# Patient Record
Sex: Female | Born: 1965
Health system: Southern US, Community
[De-identification: ages and names within clinical notes are randomized; demographics above are authoritative.]

## PROBLEM LIST (undated history)

## (undated) DIAGNOSIS — M1990 Unspecified osteoarthritis, unspecified site: Secondary | ICD-10-CM

## (undated) DIAGNOSIS — K219 Gastro-esophageal reflux disease without esophagitis: Secondary | ICD-10-CM

## (undated) DIAGNOSIS — E669 Obesity, unspecified: Secondary | ICD-10-CM

## (undated) DIAGNOSIS — G43909 Migraine, unspecified, not intractable, without status migrainosus: Secondary | ICD-10-CM

## (undated) DIAGNOSIS — J45909 Unspecified asthma, uncomplicated: Secondary | ICD-10-CM

## (undated) DIAGNOSIS — R569 Unspecified convulsions: Secondary | ICD-10-CM

## (undated) DIAGNOSIS — I10 Essential (primary) hypertension: Secondary | ICD-10-CM

## (undated) DIAGNOSIS — E785 Hyperlipidemia, unspecified: Secondary | ICD-10-CM

## (undated) DIAGNOSIS — E119 Type 2 diabetes mellitus without complications: Secondary | ICD-10-CM

## (undated) HISTORY — DX: Essential (primary) hypertension: I10

## (undated) HISTORY — DX: Hyperlipidemia, unspecified: E78.5

## (undated) HISTORY — DX: Obesity, unspecified: E66.9

## (undated) HISTORY — DX: Unspecified asthma, uncomplicated: J45.909

## (undated) HISTORY — DX: Type 2 diabetes mellitus without complications: E11.9

## (undated) HISTORY — DX: Unspecified osteoarthritis, unspecified site: M19.90

## (undated) HISTORY — PX: PARTIAL HYSTERECTOMY: SHX80

---

## 1995-11-04 HISTORY — PX: ABDOMINAL HYSTERECTOMY: SHX81

## 2004-09-25 ENCOUNTER — Ambulatory Visit: Payer: Self-pay | Admitting: Family Medicine

## 2004-10-02 ENCOUNTER — Ambulatory Visit (HOSPITAL_COMMUNITY): Admission: RE | Admit: 2004-10-02 | Discharge: 2004-10-02 | Payer: Self-pay | Admitting: Family Medicine

## 2004-10-21 ENCOUNTER — Ambulatory Visit (HOSPITAL_COMMUNITY): Admission: RE | Admit: 2004-10-21 | Discharge: 2004-10-21 | Payer: Self-pay | Admitting: Family Medicine

## 2004-10-23 ENCOUNTER — Ambulatory Visit: Payer: Self-pay | Admitting: Family Medicine

## 2005-02-20 ENCOUNTER — Ambulatory Visit: Payer: Self-pay | Admitting: Family Medicine

## 2005-06-25 ENCOUNTER — Ambulatory Visit: Payer: Self-pay | Admitting: Family Medicine

## 2006-08-24 ENCOUNTER — Encounter: Payer: Self-pay | Admitting: Family Medicine

## 2006-08-24 ENCOUNTER — Encounter (INDEPENDENT_AMBULATORY_CARE_PROVIDER_SITE_OTHER): Payer: Self-pay | Admitting: *Deleted

## 2006-08-24 ENCOUNTER — Ambulatory Visit: Payer: Self-pay | Admitting: Family Medicine

## 2006-08-24 ENCOUNTER — Other Ambulatory Visit: Admission: RE | Admit: 2006-08-24 | Discharge: 2006-08-24 | Payer: Self-pay | Admitting: Family Medicine

## 2006-08-24 LAB — CONVERTED CEMR LAB: Pap Smear: NORMAL

## 2008-02-07 ENCOUNTER — Ambulatory Visit: Payer: Self-pay | Admitting: Family Medicine

## 2008-02-09 ENCOUNTER — Ambulatory Visit (HOSPITAL_COMMUNITY): Admission: RE | Admit: 2008-02-09 | Discharge: 2008-02-09 | Payer: Self-pay | Admitting: Family Medicine

## 2008-02-10 ENCOUNTER — Encounter (INDEPENDENT_AMBULATORY_CARE_PROVIDER_SITE_OTHER): Payer: Self-pay | Admitting: *Deleted

## 2008-02-10 DIAGNOSIS — I10 Essential (primary) hypertension: Secondary | ICD-10-CM | POA: Insufficient documentation

## 2008-06-10 ENCOUNTER — Emergency Department (HOSPITAL_COMMUNITY): Admission: EM | Admit: 2008-06-10 | Discharge: 2008-06-10 | Payer: Self-pay | Admitting: Emergency Medicine

## 2008-06-28 ENCOUNTER — Ambulatory Visit: Payer: Self-pay | Admitting: Family Medicine

## 2008-06-28 DIAGNOSIS — R109 Unspecified abdominal pain: Secondary | ICD-10-CM | POA: Insufficient documentation

## 2008-07-05 ENCOUNTER — Encounter: Payer: Self-pay | Admitting: Family Medicine

## 2008-07-05 LAB — CONVERTED CEMR LAB
BUN: 22 mg/dL (ref 6–23)
Basophils Absolute: 0 10*3/uL (ref 0.0–0.1)
Basophils Relative: 0 % (ref 0–1)
CO2: 22 meq/L (ref 19–32)
Calcium: 9.6 mg/dL (ref 8.4–10.5)
Chloride: 102 meq/L (ref 96–112)
Cholesterol: 137 mg/dL (ref 0–200)
Creatinine, Ser: 0.99 mg/dL (ref 0.40–1.20)
Eosinophils Absolute: 0.2 10*3/uL (ref 0.0–0.7)
Eosinophils Relative: 3 % (ref 0–5)
Glucose, Bld: 80 mg/dL (ref 70–99)
HCT: 37.9 % (ref 36.0–46.0)
HDL: 45 mg/dL (ref >39)
Hemoglobin: 12.9 g/dL (ref 12.0–15.0)
LDL Cholesterol: 83 mg/dL (ref 0–99)
Lymphocytes Relative: 33 % (ref 12–46)
Lymphs Abs: 2.4 10*3/uL (ref 0.7–4.0)
MCHC: 34 g/dL (ref 30.0–36.0)
MCV: 89.4 fL (ref 78.0–100.0)
Monocytes Absolute: 0.4 10*3/uL (ref 0.1–1.0)
Monocytes Relative: 5 % (ref 3–12)
Neutro Abs: 4.2 10*3/uL (ref 1.7–7.7)
Neutrophils Relative %: 58 % (ref 43–77)
Platelets: 328 10*3/uL (ref 150–400)
Potassium: 4.3 meq/L (ref 3.5–5.3)
RBC: 4.24 M/uL (ref 3.87–5.11)
RDW: 13 % (ref 11.5–15.5)
Sodium: 139 meq/L (ref 135–145)
Total CHOL/HDL Ratio: 3
Triglycerides: 43 mg/dL (ref <150)
VLDL: 9 mg/dL (ref 0–40)
WBC: 7.2 10*3/uL (ref 4.0–10.5)

## 2009-09-04 ENCOUNTER — Ambulatory Visit: Payer: Self-pay | Admitting: Family Medicine

## 2009-09-04 DIAGNOSIS — R519 Headache, unspecified: Secondary | ICD-10-CM | POA: Insufficient documentation

## 2009-09-04 DIAGNOSIS — R51 Headache: Secondary | ICD-10-CM

## 2009-09-08 ENCOUNTER — Encounter: Payer: Self-pay | Admitting: Family Medicine

## 2009-09-08 LAB — CONVERTED CEMR LAB
BUN: 20 mg/dL (ref 6–23)
Basophils Absolute: 0 10*3/uL (ref 0.0–0.1)
Basophils Relative: 1 % (ref 0–1)
CO2: 25 meq/L (ref 19–32)
Calcium: 9.8 mg/dL (ref 8.4–10.5)
Chloride: 99 meq/L (ref 96–112)
Cholesterol: 136 mg/dL (ref 0–200)
Creatinine, Ser: 0.86 mg/dL (ref 0.40–1.20)
Eosinophils Absolute: 0.2 10*3/uL (ref 0.0–0.7)
Eosinophils Relative: 3 % (ref 0–5)
Glucose, Bld: 90 mg/dL (ref 70–99)
HCT: 42.2 % (ref 36.0–46.0)
HDL: 44 mg/dL (ref >39)
Hemoglobin: 14.1 g/dL (ref 12.0–15.0)
LDL Cholesterol: 83 mg/dL (ref 0–99)
Lymphocytes Relative: 26 % (ref 12–46)
Lymphs Abs: 1.7 10*3/uL (ref 0.7–4.0)
MCHC: 33.4 g/dL (ref 30.0–36.0)
MCV: 90.8 fL (ref 78.0–100.0)
Monocytes Absolute: 0.4 10*3/uL (ref 0.1–1.0)
Monocytes Relative: 6 % (ref 3–12)
Neutro Abs: 4.1 10*3/uL (ref 1.7–7.7)
Neutrophils Relative %: 64 % (ref 43–77)
Platelets: 326 10*3/uL (ref 150–400)
Potassium: 4.8 meq/L (ref 3.5–5.3)
RBC: 4.65 M/uL (ref 3.87–5.11)
RDW: 13.1 % (ref 11.5–15.5)
Sodium: 138 meq/L (ref 135–145)
TSH: 1.453 microintl units/mL (ref 0.350–4.500)
Total CHOL/HDL Ratio: 3.1
Triglycerides: 45 mg/dL (ref <150)
VLDL: 9 mg/dL (ref 0–40)
WBC: 6.4 10*3/uL (ref 4.0–10.5)

## 2009-10-04 ENCOUNTER — Ambulatory Visit: Payer: Self-pay | Admitting: Family Medicine

## 2009-10-10 ENCOUNTER — Ambulatory Visit (HOSPITAL_COMMUNITY): Admission: RE | Admit: 2009-10-10 | Discharge: 2009-10-10 | Payer: Self-pay | Admitting: Family Medicine

## 2010-04-18 ENCOUNTER — Ambulatory Visit: Payer: Self-pay | Admitting: Family Medicine

## 2010-04-19 LAB — CONVERTED CEMR LAB
BUN: 23 mg/dL (ref 6–23)
CO2: 22 meq/L (ref 19–32)
Calcium: 9.6 mg/dL (ref 8.4–10.5)
Chloride: 100 meq/L (ref 96–112)
Creatinine, Ser: 1.17 mg/dL (ref 0.40–1.20)
Glucose, Bld: 92 mg/dL (ref 70–99)
Hgb A1c MFr Bld: 6 % — ABNORMAL HIGH (ref <5.7)
Potassium: 3.8 meq/L (ref 3.5–5.3)
Sodium: 138 meq/L (ref 135–145)
Vit D, 25-Hydroxy: 21 ng/mL — ABNORMAL LOW (ref 30–89)

## 2010-08-06 ENCOUNTER — Encounter: Payer: Self-pay | Admitting: Family Medicine

## 2010-11-25 ENCOUNTER — Encounter: Payer: Self-pay | Admitting: Family Medicine

## 2010-12-03 NOTE — Assessment & Plan Note (Signed)
Summary: follow up- room 1   Vital Signs:  Patient profile:   45 year old female Menstrual status:  hysterectomy Height:      60 inches Weight:      176.75 pounds BMI:     34.64 O2 Sat:      100 % on Room air Pulse rate:   75 / minute Resp:     16 per minute BP sitting:   104 / 70  (left arm)  Vitals Entered By: Adella Hare LPN (April 18, 2010 3:51 PM)  Nutrition Counseling: Patient's BMI is greater than 25 and therefore counseled on weight management options. CC: follow-up visit Is Patient Diabetic? No Pain Assessment Patient in pain? no        CC:  follow-up visit.  History of Present Illness: Pt is here today for check up. No complaints or concerns. Hx of htn. Taking medications as prescribed, except that she ran out yesterday. No chest pain or palpitations. Does get headaches, but notices this is with stress.  No change.  She is using over the counter pain relievers as needed and this is working well.  Pt has gained wt.  She does not exercise.  States she has an active job.  She feels like she doesnt eat much, but upon discussion of her diet she is not eating fruits or veggies on a daily basis.  She drinks sodas and sweet tea daily.  Pt was to sched a pap/pelvic exam for last April.  She is overdue for this exam .  She did have her mammogram done.   Current Medications (verified): 1)  Maxzide-25 37.5-25 Mg Tabs (Triamterene-Hctz) .... Take 1 Tablet By Mouth Once A Day  Allergies (verified): No Known Drug Allergies  Past History:  Past medical history reviewed for relevance to current acute and chronic problems.  Past Medical History: Reviewed history from 06/28/2008 and no changes required. OBESITY (ICD-278.00) DIASTOLIC HYPERTENSION (ICD-401.9) h/o  Review of Systems General:  Denies chills and fever. ENT:  Denies earache, nasal congestion, and sore throat. CV:  Denies chest pain or discomfort, lightheadness, and palpitations. Resp:  Denies shortness of  breath. Neuro:  Complains of headaches.  Physical Exam  General:  Well-developed,well-nourished,in no acute distress; alert,appropriate and cooperative throughout examination Head:  Normocephalic and atraumatic without obvious abnormalities. No apparent alopecia or balding. Ears:  External ear exam shows no significant lesions or deformities.  Otoscopic examination reveals clear canals, tympanic membranes are intact bilaterally without bulging, retraction, inflammation or discharge. Hearing is grossly normal bilaterally. Nose:  External nasal examination shows no deformity or inflammation. Nasal mucosa are pink and moist without lesions or exudates. Mouth:  Oral mucosa and oropharynx without lesions or exudates.  Neck:  No deformities, masses, or tenderness noted. Lungs:  Normal respiratory effort, chest expands symmetrically. Lungs are clear to auscultation, no crackles or wheezes. Heart:  Normal rate and regular rhythm. S1 and S2 normal without gallop, murmur, click, rub or other extra sounds. Cervical Nodes:  No lymphadenopathy noted Psych:  Cognition and judgment appear intact. Alert and cooperative with normal attention span and concentration. No apparent delusions, illusions, hallucinations   Impression & Recommendations:  Problem # 1:  DIASTOLIC HYPERTENSION (ICD-401.9) Assessment Improved  Her updated medication list for this problem includes:    Maxzide-25 37.5-25 Mg Tabs (Triamterene-hctz) .Marland Kitchen... Take 1 tablet by mouth once a day  BP today: 104/70 Prior BP: 110/82 (10/04/2009)  Labs Reviewed: K+: 4.8 (09/08/2009) Creat: : 0.86 (09/08/2009)   Chol:  136 (09/08/2009)   HDL: 44 (09/08/2009)   LDL: 83 (09/08/2009)   TG: 45 (09/08/2009)  Orders: T-Basic Metabolic Panel [phone])  Problem # 2:  OBESITY (ICD-278.00) Assessment: Deteriorated  Discussed healthy diet and exercise. DASH diet handout given.  Orders: T- Hemoglobin A1C ([id number])  Problem # 3:  HEADACHE  (ICD-784.0) Assessment: Unchanged  The following medications were removed from the medication list:    Ibuprofen 800 Mg Tabs (Ibuprofen) ..... One tab by mouth two times a day    Fioricet 50-325-40 Mg Tabs (Butalbital-apap-caffeine) .Marland Kitchen... Take 1 tablet by mouth two times a day  as needed for severe headache    Ibuprofen 800 Mg Tabs (Ibuprofen) .Marland Kitchen... Take 1 tablet by mouth two times a day as needed severe headaches  Complete Medication List: 1)  Maxzide-25 37.5-25 Mg Tabs (Triamterene-hctz) .... Take 1 tablet by mouth once a day  Other Orders: T-Vitamin D (25-Hydroxy) ([id number])  Patient Instructions: 1)  Please schedule a follow-up appointment in 3 months.  Pap/physical at that time. 2)  I have refilled your blood pressure medicine. 3)  I have ordered blood work for you to have drawn today. 4)  It is important that you exercise regularly at least 20 minutes 5 times a week. If you develop chest pain, have severe difficulty breathing, or feel very tired , stop exercising immediately and seek medical attention. 5)  You need to lose weight. Consider a lower calorie diet and regular exercise.  Prescriptions: MAXZIDE-25 37.5-25 MG TABS (TRIAMTERENE-HCTZ) Take 1 tablet by mouth once a day  #30 x 3   Entered and Authorized by:   Esperanza Sheets PA   Signed by:   Esperanza Sheets PA on 04/18/2010   Method used:   Electronically to        [facility]  [address] (retail)       [address]       [place], [place]  [address]       Ph: [phone]       Fax: [phone]   RxID:   [id number]

## 2010-12-03 NOTE — Assessment & Plan Note (Signed)
Summary: PHY   Allergies: No Known Drug Allergies   Complete Medication List: 1)  Maxzide-25 37.5-25 Mg Tabs (Triamterene-hctz) .... Take 1 tablet by mouth once a day pt didnot showfor her appt

## 2011-01-01 ENCOUNTER — Telehealth (INDEPENDENT_AMBULATORY_CARE_PROVIDER_SITE_OTHER): Payer: Self-pay | Admitting: *Deleted

## 2011-01-02 ENCOUNTER — Telehealth: Payer: Self-pay | Admitting: Family Medicine

## 2011-01-09 NOTE — Progress Notes (Signed)
Summary: refill  Phone Note Call from Patient   Summary of Call: needs a refill on blood pressure pills. [facility] [phone]  Initial call taken by: Rudene Anda,  January 01, 2011 9:41 AM  Follow-up for Phone Call        patient needs appt, please schedule appt and let me know when, then i will refill Follow-up by: Adella Hare LPN,  January 01, 2011 9:43 AM  Additional Follow-up for Phone Call Additional follow up Details #1::        daughter called back and told pt would call to make appt. Additional Follow-up by: Rudene Anda,  January 02, 2011 1:24 PM

## 2011-01-09 NOTE — Progress Notes (Signed)
Summary: refill  Phone Note Call from Patient   Summary of Call: pt has made appt. needs to get meds until she comes in. 01/27/2011   [phone] Initial call taken by: Rudene Anda,  January 02, 2011 4:36 PM  Follow-up for Phone Call        pls send in 1 month and let her know Follow-up by: Syliva Overman MD,  January 03, 2011 12:12 PM    Prescriptions: MAXZIDE-25 37.5-25 MG TABS (TRIAMTERENE-HCTZ) Take 1 tablet by mouth once a day  #30 Each x 0   Entered by:   Adella Hare LPN   Authorized by:   Syliva Overman MD   Signed by:   Adella Hare LPN on 09/81/1914   Method used:   Electronically to        [facility]  [address] (retail)       [address]       [place], [place]  [address]       Ph: [phone]       Fax: [phone]   RxID:   [id number]

## 2011-01-23 ENCOUNTER — Encounter: Payer: Self-pay | Admitting: Family Medicine

## 2011-01-24 ENCOUNTER — Encounter: Payer: Self-pay | Admitting: Family Medicine

## 2011-01-27 ENCOUNTER — Ambulatory Visit (INDEPENDENT_AMBULATORY_CARE_PROVIDER_SITE_OTHER): Payer: PRIVATE HEALTH INSURANCE | Admitting: Family Medicine

## 2011-01-27 ENCOUNTER — Encounter: Payer: Self-pay | Admitting: Family Medicine

## 2011-01-27 VITALS — BP 112/80 | HR 68 | Resp 16 | Ht 60.5 in | Wt 174.0 lb

## 2011-01-27 DIAGNOSIS — E669 Obesity, unspecified: Secondary | ICD-10-CM

## 2011-01-27 DIAGNOSIS — R7309 Other abnormal glucose: Secondary | ICD-10-CM

## 2011-01-27 DIAGNOSIS — R5381 Other malaise: Secondary | ICD-10-CM

## 2011-01-27 DIAGNOSIS — E559 Vitamin D deficiency, unspecified: Secondary | ICD-10-CM

## 2011-01-27 DIAGNOSIS — R7303 Prediabetes: Secondary | ICD-10-CM

## 2011-01-27 DIAGNOSIS — I10 Essential (primary) hypertension: Secondary | ICD-10-CM

## 2011-01-27 DIAGNOSIS — R5383 Other fatigue: Secondary | ICD-10-CM

## 2011-01-27 MED ORDER — TRIAMTERENE-HCTZ 37.5-25 MG PO TABS: 1.0000 | ORAL_TABLET | Freq: Every day | ORAL | Status: DC

## 2011-01-27 NOTE — Patient Instructions (Addendum)
CPE in 3 months.  Labs today are cbc, lipids, chem 7 , vitamin d and tsh and HBA1C  It is important that you exercise regularly at least 30 minutes 5 times a week. If you develop chest pain, have severe difficulty breathing, or feel very tired, stop exercising immediately and seek medical attention    Blood pressure is great , no med changes.   Mammogram past due, we will schedule

## 2011-02-09 ENCOUNTER — Encounter: Payer: Self-pay | Admitting: Family Medicine

## 2011-02-09 NOTE — Progress Notes (Signed)
  Subjective:    Patient ID: Kelli Spencer, female    DOB: [birth year], 45 y.o.   MRN: [id number]  The PT is here for follow up and re-evaluation of chronic medical conditions, medication management and review of recent lab and radiology data.  Preventive health is updated, specifically  Cancer screening, Osteoporosis screening and Immunization.   Questions or concerns regarding consultations or procedures which the PT has had in the interim are  addressed. The PT denies any adverse reactions to current medications since the last visit.  There are no new concerns.  There are no specific complaints  She has not been diligent in lifestyle changes to improve health and promote weight loss.'she understands the need to change this Hypertension This is a chronic problem. The current episode started more than 1 year ago. The problem is unchanged. The problem is controlled. There are no associated agents to hypertension. Risk factors for coronary artery disease include obesity. Past treatments include central alpha agonists and diuretics.   Review of Systems  Denies recent fever or chills. Denies sinus pressure, nasal congestion, ear pain or sore throat. Denies chest congestion, productive cough or wheezing. Denies chest pains, palpitations, paroxysmal nocturnal dyspnea, orthopnea and leg swelling Denies abdominal pain, nausea, vomiting,diarrhea or constipation.  Denies rectal bleeding or change in bowel movement. Denies dysuria, frequency, hesitancy or incontinence. Denies joint pain, swelling and limitation and mobility. Denies headaches, seizure, numbness, or tingling. Denies depression, anxiety or insomnia. Denies skin break down or rash.        Objective:   Physical Exam    Patient alert and oriented and in no Cardiopulmonary distress.  HEENT: No facial asymmetry, EOMI, no sinus tenderness, TM's clear, Oropharynx pink and moist.  Neck supple no adenopathy.  Chest: Clear to  auscultation bilaterally.  CVS: S1, S2 no murmurs, no S3.  ABD: Soft non tender. Bowel sounds normal.  Ext: No edema  MS: Adequate ROM spine, shoulders, hips and knees.  Skin: Intact, no ulcerations or rash noted.  Psych: Good eye contact, normal affect. Memory intact not anxious or depressed appearing.  CNS: CN 2-12 intact, power, tone and sensation normal throughout.     Assessment & Plan:  1. Hypertension: controlled, continue current medication. daSH diet discussed, esp i light of obesity. 2. Obesity: lifestyle change discussed

## 2011-04-22 ENCOUNTER — Encounter: Payer: Self-pay | Admitting: Family Medicine

## 2011-04-28 ENCOUNTER — Other Ambulatory Visit (HOSPITAL_COMMUNITY)
Admission: RE | Admit: 2011-04-28 | Discharge: 2011-04-28 | Disposition: A | Discharge status: Home / Self Care | Payer: PRIVATE HEALTH INSURANCE | Source: Ambulatory Visit | Attending: Family Medicine | Admitting: Family Medicine

## 2011-04-28 ENCOUNTER — Encounter: Payer: Self-pay | Admitting: Family Medicine

## 2011-04-28 ENCOUNTER — Ambulatory Visit (INDEPENDENT_AMBULATORY_CARE_PROVIDER_SITE_OTHER): Payer: PRIVATE HEALTH INSURANCE | Admitting: Family Medicine

## 2011-04-28 VITALS — BP 112/82 | HR 68 | Resp 16 | Ht 60.25 in | Wt 170.1 lb

## 2011-04-28 DIAGNOSIS — Z0000 Encounter for general adult medical examination without abnormal findings: Secondary | ICD-10-CM

## 2011-04-28 DIAGNOSIS — R7309 Other abnormal glucose: Secondary | ICD-10-CM

## 2011-04-28 DIAGNOSIS — R109 Unspecified abdominal pain: Secondary | ICD-10-CM

## 2011-04-28 DIAGNOSIS — Z13820 Encounter for screening for osteoporosis: Secondary | ICD-10-CM

## 2011-04-28 DIAGNOSIS — Z13220 Encounter for screening for lipoid disorders: Secondary | ICD-10-CM

## 2011-04-28 DIAGNOSIS — R5383 Other fatigue: Secondary | ICD-10-CM

## 2011-04-28 DIAGNOSIS — F1910 Other psychoactive substance abuse, uncomplicated: Secondary | ICD-10-CM

## 2011-04-28 DIAGNOSIS — Z124 Encounter for screening for malignant neoplasm of cervix: Secondary | ICD-10-CM

## 2011-04-28 DIAGNOSIS — Z1211 Encounter for screening for malignant neoplasm of colon: Secondary | ICD-10-CM

## 2011-04-28 DIAGNOSIS — Z01419 Encounter for gynecological examination (general) (routine) without abnormal findings: Secondary | ICD-10-CM | POA: Insufficient documentation

## 2011-04-28 DIAGNOSIS — E669 Obesity, unspecified: Secondary | ICD-10-CM

## 2011-04-28 DIAGNOSIS — I10 Essential (primary) hypertension: Secondary | ICD-10-CM

## 2011-04-28 DIAGNOSIS — Z79899 Other long term (current) drug therapy: Secondary | ICD-10-CM

## 2011-04-28 DIAGNOSIS — R7303 Prediabetes: Secondary | ICD-10-CM

## 2011-04-28 LAB — POC HEMOCCULT BLD/STL (OFFICE/1-CARD/DIAGNOSTIC): Fecal Occult Blood, POC: NEGATIVE

## 2011-04-28 NOTE — Patient Instructions (Addendum)
F/u in 4 months.  All fasting labs need to be done asap.  You were almost diabetic last year, it is vital that you cut back on carbs and sweets , start regular exercise and lose weight so you do not become diabetic.   Blood pressure is good, no med change  It is important that you exercise regularly at least 30 minutes 5 times a week. If you develop chest pain, have severe difficulty breathing, or feel very tired, stop exercising immediately and seek medical attention  A healthy diet is rich in fruit, vegetables and whole grains. Poultry fish, nuts and beans are a healthy choice for protein rather then red meat. A low sodium diet and drinking 64 ounces of water daily is generally recommended. Oils and sweet should be limited. Carbohydrates especially for those who are diabetic or overweight, should be limited to 30-45 gram per meal. It is important to eat on a regular schedule, at least 3 times daily. Snacks should be primarily fruits, vegetables or nuts.

## 2011-04-30 DIAGNOSIS — R7303 Prediabetes: Secondary | ICD-10-CM | POA: Insufficient documentation

## 2011-04-30 DIAGNOSIS — I10 Essential (primary) hypertension: Secondary | ICD-10-CM | POA: Insufficient documentation

## 2011-04-30 NOTE — Progress Notes (Signed)
  Subjective:    Patient ID: Kelli Spencer, female    DOB: [birth year], 45 y.o.   MRN: [id number]  HPI The PT is here for annual examand re-evaluation of chronic medical conditions, medication management and review of recent lab and radiology data.  Preventive health is updated, specifically  Cancer screening,  and Immunization.  Cannot recall last tetanus but believes less than 10 yrs ago and refusing today Questions or concerns regarding consultations or procedures which the PT has had in the interim are  addressed. The PT denies any adverse reactions to current medications since the last visit.  C/o intermittent RUQ pain for past several months , which "stops her in her tracks"Denies hematuria, occasional abdominal bloating and excessive belching       Review of Systems Denies recent fever or chills. Denies sinus pressure, nasal congestion, ear pain or sore throat. Denies chest congestion, productive cough or wheezing. Denies chest pains, palpitations, paroxysmal nocturnal dyspnea, orthopnea and leg swelling Denies  nausea, vomiting,diarrhea or constipation.  Denies rectal bleeding or change in bowel movement. Denies dysuria, frequency, hesitancy or incontinence. Denies joint pain, swelling and limitation in mobility. Denies headaches, seizure, numbness, or tingling. Denies depression, anxiety or insomnia. Denies skin break down or rash.        Objective:   Physical Exam    Pleasant well nourished female, alert and oriented x 3, in no cardio-pulmonary distress. Afebrile. HEENT No facial trauma or asymetry. No sinus tenderness  EOMI, PERTL, fundoscopic exam is normal, no hemorhage or exudate. No papiledema External ears normal, tympanic membranes clear. Oropharynx moist, no exudate, good dentition. Neck: supple, no adenopathy,JVD or thyromegaly.No bruits.  Chest: Clear to ascultation bilaterally.No crackles or wheezes. Non tender to palpation  Breast: No asymetry,no  masses. No nipple discharge or inversion. No axillary or supraclavicular adenopathy  Cardiovascular system; Heart sounds normal,  S1 and  S2 ,no S3.  No murmur, or thrill. Apical beat not displaced Peripheral pulses normal.  Abdomen: Soft, non tender, no organomegaly or masses. No bruits. Bowel sounds normal. No guarding, tenderness or rebound.  Rectal:  No mass. guaic negative stool.  GU: External genitalia normal. No lesions. Vaginal canal normal.No discharge. Uterusabsent, no adnexal masses, no adnexal tenderness.  Musculoskeletal exam: Full ROM of spine, hips , shoulders and knees. No deformity ,swelling or crepitus noted. No muscle wasting or atrophy.   Neurologic: Cranial nerves 2 to 12 intact. Power, tone ,sensation and reflexes normal throughout. No disturbance in gait. No tremor.  Skin: Intact, no ulceration, erythema , scaling or rash noted. Pigmentation normal throughout  Psych; Normal mood and affect. Judgement and concentration normal    Assessment & Plan:

## 2011-04-30 NOTE — Assessment & Plan Note (Signed)
Controlled, no change in medication  

## 2011-04-30 NOTE — Assessment & Plan Note (Signed)
New intermittent recurrent RUQ pain, will order [place] to further evaluate also kUB

## 2011-04-30 NOTE — Assessment & Plan Note (Signed)
unchanged Patient re-educated about  the importance of commitment to a  minimum of 150 minutes of exercise per week. The importance of healthy food choices with portion control discussed. Encouraged to start a food diary, count calories and to consider  joining a support group. Sample diet sheets offered. Goals set by the patient for the next several months.    

## 2011-04-30 NOTE — Assessment & Plan Note (Signed)
Counseled re need to make dietary and exercise changes to prevent development of the disease

## 2011-05-03 LAB — CBC WITH DIFFERENTIAL/PLATELET
Basophils Absolute: 0 10*3/uL (ref 0.0–0.1)
Eosinophils Absolute: 0.2 10*3/uL (ref 0.0–0.7)
Eosinophils Relative: 3 % (ref 0–5)
HCT: 37.7 % (ref 36.0–46.0)
Hemoglobin: 12.5 g/dL (ref 12.0–15.0)
Lymphocytes Relative: 31 % (ref 12–46)
MCHC: 33.2 g/dL (ref 30.0–36.0)
MCV: 90.6 fL (ref 78.0–100.0)
Monocytes Absolute: 0.4 10*3/uL (ref 0.1–1.0)
Monocytes Relative: 6 % (ref 3–12)
Neutro Abs: 3.6 10*3/uL (ref 1.7–7.7)
Neutrophils Relative %: 59 % (ref 43–77)
Platelets: 324 10*3/uL (ref 150–400)
RDW: 13.3 % (ref 11.5–15.5)
WBC: 6.1 10*3/uL (ref 4.0–10.5)

## 2011-05-03 LAB — LIPID PANEL
HDL: 45 mg/dL (ref >39)
LDL Cholesterol: 87 mg/dL (ref 0–99)
VLDL: 9 mg/dL (ref 0–40)

## 2011-05-03 LAB — HEPATIC FUNCTION PANEL
ALT: 11 U/L (ref 0–35)
Albumin: 4.3 g/dL (ref 3.5–5.2)
Alkaline Phosphatase: 76 U/L (ref 39–117)
Bilirubin, Direct: 0.1 mg/dL (ref 0.0–0.3)
Indirect Bilirubin: 0.4 mg/dL (ref 0.0–0.9)
Total Bilirubin: 0.5 mg/dL (ref 0.3–1.2)
Total Protein: 7.6 g/dL (ref 6.0–8.3)

## 2011-05-03 LAB — BASIC METABOLIC PANEL
BUN: 21 mg/dL (ref 6–23)
CO2: 28 mEq/L (ref 19–32)
Calcium: 9.7 mg/dL (ref 8.4–10.5)
Chloride: 99 mEq/L (ref 96–112)
Creat: 0.96 mg/dL (ref 0.50–1.10)
Glucose, Bld: 98 mg/dL (ref 70–99)
Potassium: 3.5 mEq/L (ref 3.5–5.3)

## 2011-05-03 LAB — TSH: TSH: 1.344 u[IU]/mL (ref 0.350–4.500)

## 2011-05-05 ENCOUNTER — Other Ambulatory Visit (HOSPITAL_COMMUNITY): Payer: PRIVATE HEALTH INSURANCE

## 2011-05-07 LAB — VITAMIN D 1,25 DIHYDROXY
Vitamin D 1, 25 (OH)2 Total: 59 pg/mL (ref 18–72)
Vitamin D2 1, 25 (OH)2: <8 pg/mL
Vitamin D3 1, 25 (OH)2: 59 pg/mL

## 2011-05-08 ENCOUNTER — Telehealth: Payer: Self-pay | Admitting: Family Medicine

## 2011-05-12 NOTE — Telephone Encounter (Signed)
Called patient, no answer All labs were normal

## 2011-05-14 NOTE — Telephone Encounter (Signed)
Called patient, no answer 

## 2011-06-07 ENCOUNTER — Emergency Department (HOSPITAL_COMMUNITY)
Admission: EM | Admit: 2011-06-07 | Discharge: 2011-06-07 | Disposition: A | Discharge status: Home / Self Care | Payer: PRIVATE HEALTH INSURANCE | Attending: Emergency Medicine | Admitting: Emergency Medicine

## 2011-06-07 ENCOUNTER — Encounter (HOSPITAL_COMMUNITY): Payer: Self-pay | Admitting: *Deleted

## 2011-06-07 DIAGNOSIS — I10 Essential (primary) hypertension: Secondary | ICD-10-CM | POA: Insufficient documentation

## 2011-06-07 DIAGNOSIS — G43909 Migraine, unspecified, not intractable, without status migrainosus: Secondary | ICD-10-CM

## 2011-06-07 HISTORY — DX: Migraine, unspecified, not intractable, without status migrainosus: G43.909

## 2011-06-07 MED ORDER — METOCLOPRAMIDE HCL 10 MG PO TABS: 10.0000 mg | ORAL_TABLET | Freq: Four times a day (QID) | ORAL | Status: AC

## 2011-06-07 MED ORDER — DIPHENHYDRAMINE HCL 50 MG/ML IJ SOLN
25.0000 mg | Freq: Once | INTRAMUSCULAR | Status: AC
  Administered 2011-06-07: 50 mg via INTRAVENOUS
  Filled 2011-06-07: qty 1

## 2011-06-07 MED ORDER — SODIUM CHLORIDE 0.9 % IV BOLUS (SEPSIS)
1000.0000 mL | Freq: Once | INTRAVENOUS | Status: AC
  Administered 2011-06-07: 1000 mL via INTRAVENOUS

## 2011-06-07 MED ORDER — SODIUM CHLORIDE 0.9 % IV SOLN: Freq: Once | INTRAVENOUS | Status: DC

## 2011-06-07 MED ORDER — METOCLOPRAMIDE HCL 5 MG/ML IJ SOLN
10.0000 mg | Freq: Once | INTRAMUSCULAR | Status: AC
  Administered 2011-06-07: 10 mg via INTRAVENOUS
  Filled 2011-06-07: qty 2

## 2011-06-07 NOTE — ED Provider Notes (Signed)
History     CSN: [id number] Arrival date & time: 06/07/2011  4:04 PM  Chief Complaint  Patient presents with  . Headache   Patient is a 45 y.o. female presenting with headaches. The history is provided by the patient.  Headache  This is a new problem. The current episode started yesterday. The problem occurs constantly. The problem has not changed since onset.The headache is associated with loud noise and bright light. The pain is located in the left unilateral region. The quality of the pain is described as sharp. The pain is at a severity of 10/10. The pain is severe. The pain does not radiate. Associated symptoms include nausea. Pertinent negatives include no vomiting. She has tried NSAIDs for the symptoms. The treatment provided no relief.    Past Medical History  Diagnosis Date  . Obesity   . Diastolic hypertension   . Migraines     Past Surgical History  Procedure Date  . Partial hysterectomy   . Abdominal hysterectomy     Family History  Problem Relation Age of Onset  . Kidney failure Mother   . Diabetes Mother   . Hypertension Mother   . Stroke Mother   . Diabetes Father   . Heart disease Father     History  Substance Use Topics  . Smoking status: Never Smoker   . Smokeless tobacco: Not on file  . Alcohol Use: No    OB History    Grav Para Term Preterm Abortions TAB SAB Ect Mult Living                  Review of Systems  Unable to perform ROS Gastrointestinal: Positive for nausea. Negative for vomiting.  Neurological: Positive for headaches.  All other systems reviewed and are negative.    Physical Exam  BP 125/82  Pulse 79  Temp(Src) 99.3 F (37.4 C) (Oral)  Resp 20  Ht 5' (1.524 m)  Wt 174 lb (78.926 kg)  BMI 33.98 kg/m2  SpO2 99%  Physical Exam  Constitutional: She is oriented to person, place, and time. She appears well-developed and well-nourished. No distress.  HENT:  Head: Normocephalic and atraumatic.  Right Ear: External ear  normal.  Left Ear: External ear normal.  Nose: Nose normal.  Mouth/Throat: Oropharynx is clear and moist.  Eyes: Conjunctivae and EOM are normal. Pupils are equal, round, and reactive to light. No scleral icterus.       Fundi are normal.  Neck: Normal range of motion. Neck supple. No JVD present.  Cardiovascular: Normal rate, regular rhythm and normal heart sounds.   No murmur heard. Pulmonary/Chest: Effort normal and breath sounds normal. She has no wheezes. She has no rales.  Abdominal: Soft. Bowel sounds are normal. She exhibits no mass. There is no tenderness.  Musculoskeletal: Normal range of motion. She exhibits no edema.  Lymphadenopathy:    She has no cervical adenopathy.  Neurological: She is alert and oriented to person, place, and time. She has normal reflexes. No cranial nerve deficit. Coordination normal.  Skin: Skin is warm and dry. No rash noted.  Psychiatric: She has a normal mood and affect.    ED Course  Procedures  MDM Headache is much improved after IV fluids, Reglan, Benadryl.      Dione Booze, MD 06/07/11 [phone]

## 2011-06-07 NOTE — ED Notes (Signed)
Pt has had a headache since yesterday. C/o nausea. Pt states that she took 2 of her Blood pressure pills earlier today instead of 1. Pt has a history of migraines.

## 2011-07-16 ENCOUNTER — Other Ambulatory Visit: Payer: Self-pay | Admitting: Family Medicine

## 2011-08-29 ENCOUNTER — Encounter: Payer: Self-pay | Admitting: Family Medicine

## 2011-09-01 ENCOUNTER — Other Ambulatory Visit: Payer: Self-pay | Admitting: Family Medicine

## 2011-09-01 ENCOUNTER — Encounter: Payer: Self-pay | Admitting: Family Medicine

## 2011-09-01 ENCOUNTER — Ambulatory Visit (INDEPENDENT_AMBULATORY_CARE_PROVIDER_SITE_OTHER): Payer: PRIVATE HEALTH INSURANCE | Admitting: Family Medicine

## 2011-09-01 VITALS — BP 120/80 | HR 68 | Resp 16 | Ht 60.25 in | Wt 170.0 lb

## 2011-09-01 DIAGNOSIS — R7303 Prediabetes: Secondary | ICD-10-CM

## 2011-09-01 DIAGNOSIS — R7309 Other abnormal glucose: Secondary | ICD-10-CM

## 2011-09-01 DIAGNOSIS — E669 Obesity, unspecified: Secondary | ICD-10-CM

## 2011-09-01 DIAGNOSIS — I10 Essential (primary) hypertension: Secondary | ICD-10-CM

## 2011-09-01 DIAGNOSIS — Z139 Encounter for screening, unspecified: Secondary | ICD-10-CM

## 2011-09-01 NOTE — Patient Instructions (Addendum)
F/U in 6 months  HBA1C , chem 7 and vit D  Non fasting in 6 months LABWORK  NEEDS TO BE DONE BETWEEN 3 TO 7 DAYS BEFORE YOUR NEXT SCEDULED  VISIT.  THIS WILL IMPROVE THE QUALITY OF YOUR CARE.   Mammogram is past due we will schedule a 3:45 to 4pm appt  It is important that you exercise regularly at least 30 minutes 5 times a week. If you develop chest pain, have severe difficulty breathing, or feel very tired, stop exercising immediately and seek medical attention   A healthy diet is rich in fruit, vegetables and whole grains. Poultry fish, nuts and beans are a healthy choice for protein rather then red meat. A low sodium diet and drinking 64 ounces of water daily is generally recommended. Oils and sweet should be limited. Carbohydrates especially for those who are diabetic or overweight, should be limited to 30-45 gram per meal. It is important to eat on a regular schedule, at least 3 times daily. Snacks should be primarily fruits, vegetables or nuts.  Vit D is low. You need to take OTC Vit D3 800IU one daily

## 2011-09-02 NOTE — Progress Notes (Signed)
  Subjective:    Patient ID: Kelli Spencer, female    DOB: July 24, 1966, 45 y.o.   MRN: [id number]  HPI The PT is here for follow up and re-evaluation of chronic medical conditions, medication management and review of any available recent lab and radiology data.  Preventive health is updated, specifically  Cancer screening and Immunization.   Questions or concerns regarding consultations or procedures which the PT has had in the interim are  addressed. The PT denies any adverse reactions to current medications since the last visit.  There are no new concerns.  There are no specific complaints       Review of Systems Denies recent fever or chills. Denies sinus pressure, nasal congestion, ear pain or sore throat. Denies chest congestion, productive cough or wheezing. Denies chest pains, palpitations and leg swelling Denies abdominal pain, nausea, vomiting,diarrhea or constipation.   Denies dysuria, frequency, hesitancy or incontinence. Denies joint pain, swelling and limitation in mobility. Denies headaches, seizures, numbness, or tingling. Denies depression, anxiety or insomnia. Denies skin break down or rash.        Objective:   Physical Exam Patient alert and oriented and in no cardiopulmonary distress.  HEENT: No facial asymmetry, EOMI, no sinus tenderness,  oropharynx pink and moist.  Neck supple no adenopathy.  Chest: Clear to auscultation bilaterally.  CVS: S1, S2 no murmurs, no S3.  ABD: Soft non tender. Bowel sounds normal.  Ext: No edema  MS: Adequate ROM spine, shoulders, hips and knees.  Skin: Intact, no ulcerations or rash noted.  Psych: Good eye contact, normal affect. Memory intact not anxious or depressed appearing.  CNS: CN 2-12 intact, power, tone and sensation normal throughout.        Assessment & Plan:

## 2011-09-02 NOTE — Assessment & Plan Note (Signed)
Controlled, no change in medication  

## 2011-09-02 NOTE — Assessment & Plan Note (Signed)
Improved. Pt applauded on succesful weight loss through lifestyle change, and encouraged to continue same. Weight loss goal set for the next several months.  

## 2011-09-02 NOTE — Assessment & Plan Note (Signed)
rept HBa1c due pt to obtain asap

## 2011-09-08 ENCOUNTER — Ambulatory Visit (HOSPITAL_COMMUNITY): Payer: PRIVATE HEALTH INSURANCE

## 2011-12-10 ENCOUNTER — Other Ambulatory Visit: Payer: Self-pay | Admitting: Family Medicine

## 2012-03-01 ENCOUNTER — Ambulatory Visit (INDEPENDENT_AMBULATORY_CARE_PROVIDER_SITE_OTHER): Payer: PRIVATE HEALTH INSURANCE | Admitting: Family Medicine

## 2012-03-01 ENCOUNTER — Other Ambulatory Visit: Payer: Self-pay | Admitting: Family Medicine

## 2012-03-01 ENCOUNTER — Encounter: Payer: Self-pay | Admitting: Family Medicine

## 2012-03-01 VITALS — BP 118/80 | HR 77 | Resp 16 | Ht 60.25 in | Wt 174.1 lb

## 2012-03-01 DIAGNOSIS — E669 Obesity, unspecified: Secondary | ICD-10-CM

## 2012-03-01 DIAGNOSIS — Z139 Encounter for screening, unspecified: Secondary | ICD-10-CM

## 2012-03-01 DIAGNOSIS — R7301 Impaired fasting glucose: Secondary | ICD-10-CM

## 2012-03-01 DIAGNOSIS — I10 Essential (primary) hypertension: Secondary | ICD-10-CM

## 2012-03-01 DIAGNOSIS — R7303 Prediabetes: Secondary | ICD-10-CM

## 2012-03-01 DIAGNOSIS — R7309 Other abnormal glucose: Secondary | ICD-10-CM

## 2012-03-01 MED ORDER — TRIAMTERENE-HCTZ 37.5-25 MG PO CAPS: 1.0000 | ORAL_CAPSULE | Freq: Every day | ORAL | Status: DC

## 2012-03-01 NOTE — Progress Notes (Signed)
  Subjective:    Patient ID: Kelli Spencer, female    DOB: [birth year], 46 y.o.   MRN: [id number]  HPI The PT is here for follow up and re-evaluation of chronic medical conditions, medication management and review of any available recent lab and radiology data.  Preventive health is updated, specifically  Cancer screening and Immunization.   Questions or concerns regarding consultations or procedures which the PT has had in the interim are  addressed. The PT denies any adverse reactions to current medications since the last visit.  There are no new concerns.  There are no specific complaints . Pt still not exercising regularly, eats 2 meals daily and drinks a lot of sodas      Review of Systems See HPI Denies recent fever or chills. Denies sinus pressure, nasal congestion, ear pain or sore throat. Denies chest congestion, productive cough or wheezing. Denies chest pains, palpitations and leg swelling Denies abdominal pain, nausea, vomiting,diarrhea or constipation.   Denies dysuria, frequency, hesitancy or incontinence. C/o bilateral knee pain espescially after standing for 8 hours on the job, no instability Denies headaches, seizures, numbness, or tingling. Denies depression, anxiety or insomnia. Denies skin break down or rash.        Objective:   Physical Exam  Patient alert and oriented and in no cardiopulmonary distress.  HEENT: No facial asymmetry, EOMI, no sinus tenderness,  oropharynx pink and moist.  Neck supple no adenopathy.  Chest: Clear to auscultation bilaterally.  CVS: S1, S2 no murmurs, no S3.  ABD: Soft non tender. Bowel sounds normal.  Ext: No edema  MS: Adequate ROM spine, shoulders, hips and knees.Crepitus in knees, left greater than right  Skin: Intact, no ulcerations or rash noted.  Psych: Good eye contact, normal affect. Memory intact not anxious or depressed appearing.  CNS: CN 2-12 intact, power, tone and sensation normal  throughout.       Assessment & Plan:

## 2012-03-01 NOTE — Patient Instructions (Addendum)
F/U IN 5.5 MONTH  It is important that you exercise regularly at least 30 minutes 5 times a week. If you develop chest pain, have severe difficulty breathing, or feel very tired, stop exercising immediately and seek medical attention     Hba1c AND VIT d LEVELS TODAY.  REMEMBER THAT YOU ARE PREDIABETIC, YOU REALLY NEED TO WORK ON WEIGHT LOSS AND EXERCISE  mAMOGRAM WILL BE SCHEDULED ON THE WAY OUT

## 2012-03-01 NOTE — Assessment & Plan Note (Signed)
Deteriorated. Patient re-educated about  the importance of commitment to a  minimum of 150 minutes of exercise per week. The importance of healthy food choices with portion control discussed. Encouraged to start a food diary, count calories and to consider  joining a support group. Sample diet sheets offered. Goals set by the patient for the next several months.    

## 2012-03-01 NOTE — Assessment & Plan Note (Signed)
Counseled re the importance of dietary change and weight loss with regular exercise at least 3 days per week to improve blood sugar control

## 2012-03-01 NOTE — Assessment & Plan Note (Signed)
Controlled, no change in medication  

## 2012-03-02 LAB — VITAMIN D 25 HYDROXY (VIT D DEFICIENCY, FRACTURES): Vit D, 25-Hydroxy: 45 ng/mL (ref 30–89)

## 2012-03-02 LAB — HEMOGLOBIN A1C
Hgb A1c MFr Bld: 6 % — ABNORMAL HIGH (ref <5.7)
Mean Plasma Glucose: 126 mg/dL — ABNORMAL HIGH (ref <117)

## 2012-03-08 ENCOUNTER — Ambulatory Visit (HOSPITAL_COMMUNITY)
Admission: RE | Admit: 2012-03-08 | Discharge: 2012-03-08 | Disposition: A | Discharge status: Home / Self Care | Payer: PRIVATE HEALTH INSURANCE | Source: Ambulatory Visit | Attending: Family Medicine | Admitting: Family Medicine

## 2012-03-08 DIAGNOSIS — Z139 Encounter for screening, unspecified: Secondary | ICD-10-CM

## 2012-03-08 DIAGNOSIS — Z1231 Encounter for screening mammogram for malignant neoplasm of breast: Secondary | ICD-10-CM | POA: Insufficient documentation

## 2012-05-10 ENCOUNTER — Ambulatory Visit (INDEPENDENT_AMBULATORY_CARE_PROVIDER_SITE_OTHER): Payer: PRIVATE HEALTH INSURANCE | Admitting: Family Medicine

## 2012-05-10 ENCOUNTER — Encounter: Payer: Self-pay | Admitting: Family Medicine

## 2012-05-10 VITALS — BP 112/80 | HR 72 | Resp 16 | Ht 60.25 in | Wt 179.4 lb

## 2012-05-10 DIAGNOSIS — M25569 Pain in unspecified knee: Secondary | ICD-10-CM

## 2012-05-10 DIAGNOSIS — R7309 Other abnormal glucose: Secondary | ICD-10-CM

## 2012-05-10 DIAGNOSIS — R7303 Prediabetes: Secondary | ICD-10-CM

## 2012-05-10 DIAGNOSIS — I10 Essential (primary) hypertension: Secondary | ICD-10-CM

## 2012-05-10 MED ORDER — KETOROLAC TROMETHAMINE 60 MG/2ML IM SOLN
60.0000 mg | Freq: Once | INTRAMUSCULAR | Status: AC
  Administered 2012-05-10: 60 mg via INTRAMUSCULAR

## 2012-05-10 MED ORDER — PREDNISONE (PAK) 5 MG PO TABS: 5.0000 mg | ORAL_TABLET | ORAL | Status: DC

## 2012-05-10 MED ORDER — IBUPROFEN 800 MG PO TABS: 800.0000 mg | ORAL_TABLET | Freq: Three times a day (TID) | ORAL | Status: AC | PRN

## 2012-05-10 MED ORDER — METHYLPREDNISOLONE ACETATE 80 MG/ML IJ SUSP
80.0000 mg | Freq: Once | INTRAMUSCULAR | Status: AC
  Administered 2012-05-10: 80 mg via INTRAMUSCULAR

## 2012-05-10 NOTE — Patient Instructions (Addendum)
F/u in 2 month  You are bing treated for acute right knee pain, you will get injections in the office and medication is also sent in to your pharmacy.I have ordered an x ray of both knees  Work excuse from today to return in 1 week   Please call if not much improved in 2 days , you will be referred to orthopedics

## 2012-05-11 ENCOUNTER — Ambulatory Visit (HOSPITAL_COMMUNITY)
Admission: RE | Admit: 2012-05-11 | Discharge: 2012-05-11 | Disposition: A | Discharge status: Home / Self Care | Payer: PRIVATE HEALTH INSURANCE | Source: Ambulatory Visit | Attending: Family Medicine | Admitting: Family Medicine

## 2012-05-11 DIAGNOSIS — M25569 Pain in unspecified knee: Secondary | ICD-10-CM | POA: Insufficient documentation

## 2012-05-11 DIAGNOSIS — IMO0002 Reserved for concepts with insufficient information to code with codable children: Secondary | ICD-10-CM | POA: Insufficient documentation

## 2012-05-11 DIAGNOSIS — M1710 Unilateral primary osteoarthritis, unspecified knee: Secondary | ICD-10-CM | POA: Insufficient documentation

## 2012-05-13 ENCOUNTER — Telehealth: Payer: Self-pay | Admitting: Orthopedic Surgery

## 2012-05-13 NOTE — Telephone Encounter (Signed)
Spoke with Collene Leyden about scheduling an appointment with Dr. Romeo Apple, and she did not want to schedule at this time.  Said her knee is much Better and she will call us back if it starts hurting again. Thanks for the referral

## 2012-05-17 NOTE — Assessment & Plan Note (Signed)
Encouraged to follow low carb diet and reduce sugar intake to reduce risk of development of diabetes

## 2012-05-17 NOTE — Assessment & Plan Note (Signed)
Controlled, no change in medication  

## 2012-05-17 NOTE — Assessment & Plan Note (Signed)
Short sharp course of anti inflammatories prescribed

## 2012-05-17 NOTE — Progress Notes (Signed)
  Subjective:    Patient ID: Kelli Spencer, female    DOB: 1966-07-16, 46 y.o.   MRN: [id number]  HPI Pt in with acute knee pain and swelling with no h/o trauma to same. This is disabling, pt unable to weight bear or work. This is the first episode    Review of Systems See HPI Denies recent fever or chills. Denies sinus pressure, nasal congestion, ear pain or sore throat. Denies chest congestion, productive cough or wheezing. Denies chest pains, palpitations and leg swelling Denies abdominal pain, nausea, vomiting,diarrhea or constipation.     Denies depression,has  Anxiety and  Insomnia because of her pain Denies skin break down or rash.        Objective:   Physical Exam Patient alert and oriented and in no cardiopulmonary distress.Pt in pain  HEENT: No facial asymmetry, EOMI, no sinus tenderness,  oropharynx pink and moist.  Neck supple no adenopathy.  Chest: Clear to auscultation bilaterally.  CVS: S1, S2 no murmurs, no S3.  ABD: Soft non tender. Bowel sounds normal.  Ext: No edema  MS: Adequate ROM spine, shoulders, hips and markedly reduced in right knee which is swollen and tender  Skin: Intact, no ulcerations or rash noted.  Psych: Good eye contact, normal affect. Memory intact anxious not depressed appearing.  CNS: CN 2-12 intact, power, tone and sensation normal throughout.        Assessment & Plan:

## 2012-06-14 ENCOUNTER — Encounter: Payer: Self-pay | Admitting: Family Medicine

## 2012-06-14 ENCOUNTER — Ambulatory Visit (INDEPENDENT_AMBULATORY_CARE_PROVIDER_SITE_OTHER): Payer: PRIVATE HEALTH INSURANCE | Admitting: Family Medicine

## 2012-06-14 VITALS — BP 108/72 | HR 80 | Resp 16 | Ht 60.25 in | Wt 174.4 lb

## 2012-06-14 DIAGNOSIS — E669 Obesity, unspecified: Secondary | ICD-10-CM

## 2012-06-14 DIAGNOSIS — R7309 Other abnormal glucose: Secondary | ICD-10-CM

## 2012-06-14 DIAGNOSIS — I10 Essential (primary) hypertension: Secondary | ICD-10-CM

## 2012-06-14 DIAGNOSIS — R7303 Prediabetes: Secondary | ICD-10-CM

## 2012-06-14 DIAGNOSIS — M25569 Pain in unspecified knee: Secondary | ICD-10-CM

## 2012-06-14 MED ORDER — TRIAMTERENE-HCTZ 37.5-25 MG PO CAPS: 1.0000 | ORAL_CAPSULE | Freq: Every day | ORAL | Status: DC

## 2012-06-14 NOTE — Patient Instructions (Addendum)
F/u in 6 month, call if you need me before  You DO need to have orthopedic evaluation of the right knee.  We are calling for an appointment, please call the office by 4pm tomorrow if you have not heard from [place], we will call after 3,  And ask for referral staff  HBA1c, fasting chem 7, cbc and lipid and tSH in 6 month before the visit

## 2012-06-15 ENCOUNTER — Telehealth: Payer: Self-pay | Admitting: Family Medicine

## 2012-06-15 NOTE — Telephone Encounter (Signed)
Pt has appt with dr. Romeo Apple on 06/17/2012 9:00. Pt is aware

## 2012-06-16 ENCOUNTER — Encounter: Payer: Self-pay | Admitting: Orthopedic Surgery

## 2012-06-16 ENCOUNTER — Ambulatory Visit (INDEPENDENT_AMBULATORY_CARE_PROVIDER_SITE_OTHER): Payer: PRIVATE HEALTH INSURANCE | Admitting: Orthopedic Surgery

## 2012-06-16 VITALS — BP 100/70 | Ht 60.25 in | Wt 174.0 lb

## 2012-06-16 DIAGNOSIS — M7650 Patellar tendinitis, unspecified knee: Secondary | ICD-10-CM

## 2012-06-16 DIAGNOSIS — M7651 Patellar tendinitis, right knee: Secondary | ICD-10-CM | POA: Insufficient documentation

## 2012-06-16 MED ORDER — PREDNISONE 10 MG PO TABS: 10.0000 mg | ORAL_TABLET | Freq: Three times a day (TID) | ORAL | Status: DC

## 2012-06-16 NOTE — Progress Notes (Signed)
  Subjective:    Patient ID: Kelli Spencer, female    DOB: 1966-04-17, 46 y.o.   MRN: [id number]  Knee Pain  Incident onset: One month ago. Incident location: Atraumatic. There was no injury mechanism. The pain is present in the right knee (Over the patellar tendon with swelling). The quality of the pain is described as aching. The pain is at a severity of 8/10. The pain has been intermittent since onset. Associated symptoms include a loss of motion. Pertinent negatives include no inability to bear weight, loss of sensation, muscle weakness, numbness or tingling. Associated symptoms comments: Swelling over the patellar tendon with painful flexion and difficult extension.      Review of Systems  Musculoskeletal: Positive for joint swelling.  Neurological: Negative for tingling and numbness.  All other systems reviewed and are negative.       Objective:   Physical Exam  Constitutional: She is oriented to person, place, and time. She appears well-developed and well-nourished.  Cardiovascular: Intact distal pulses.   Neurological: She is alert and oriented to person, place, and time. She has normal reflexes. She displays normal reflexes. She exhibits normal muscle tone.  Skin: Skin is warm and dry.  Psychiatric: She has a normal mood and affect. Her behavior is normal. Judgment normal.  .phys Right Knee Exam   Tenderness  The patient is experiencing tenderness in the lateral joint line.  Range of Motion  The patient has normal right knee ROM.  Muscle Strength   The patient has normal right knee strength.  Tests  Drawer:       Anterior - negative    Posterior - negative Patellar Apprehension: negative  Other  Erythema: absent Scars: absent Sensation: normal Pulse: present Swelling: moderate  Comments:  The patella tendon is swollen she has painful passive flexion and painful active extension   Left Knee Exam  Left knee exam is normal.  Tenderness  The patient is  experiencing no tenderness.     Range of Motion  The patient has normal left knee ROM.  Muscle Strength   The patient has normal left knee strength.  Tests  Drawer:       Anterior - negative     Posterior - negative  Other  Erythema: absent Scars: absent Sensation: normal Pulse: present Swelling: none     The imaging included knee x-rays and there was soft tissue swelling over the patellar tendon of the right knee       Assessment & Plan:  Patellar tendinitis  Recommend the immobilizer rest out of work prednisone Norco followup 3 weeks

## 2012-06-16 NOTE — Patient Instructions (Addendum)
OOW X 3 WEEKS   TAKE PREDNISONE 3 WEEKS   WEAR BRACE WHEN NOT SLEEPING

## 2012-06-17 ENCOUNTER — Ambulatory Visit: Payer: PRIVATE HEALTH INSURANCE | Admitting: Orthopedic Surgery

## 2012-06-18 NOTE — Assessment & Plan Note (Signed)
Controlled, no change in medication DASH diet and commitment to daily physical activity for a minimum of 30 minutes discussed and encouraged, as a part of hypertension management. The importance of attaining a healthy weight is also discussed.  

## 2012-06-18 NOTE — Assessment & Plan Note (Signed)
Unchanged Patient educated about the importance of limiting  Carbohydrate intake , the need to commit to daily physical activity for a minimum of 30 minutes , and to commit weight loss. The fact that changes in all these areas will reduce or eliminate all together the development of diabetes is stressed.    

## 2012-06-18 NOTE — Progress Notes (Signed)
  Subjective:    Patient ID: Kelli Spencer, female    DOB: 1966/03/10, 46 y.o.   MRN: [id number]  HPI Pt in for f/u of right knee pain and swelling which is not much improved. Though she had been advised to call in sooner as well as the fact that she was referred to ortho, but declined to go. Still reports pain, swelling and instability, al;l 3 are obvious   Review of Systems See HPI Denies recent fever or chills. Denies sinus pressure, nasal congestion, ear pain or sore throat. Denies chest congestion, productive cough or wheezing. Denies chest pains, palpitations and leg swelling Denies abdominal pain, nausea, vomiting,diarrhea or constipation.   Denies dysuria, frequency, hesitancy or incontinence.  Denies headaches, seizures, numbness, or tingling. Denies depression, anxiety or insomnia. Denies skin break down or rash.        Objective:   Physical Exam Patient alert and oriented and in no cardiopulmonary distress.  HEENT: No facial asymmetry, EOMI, no sinus tenderness,  oropharynx pink and moist.  Neck supple no adenopathy.  Chest: Clear to auscultation bilaterally.  CVS: S1, S2 no murmurs, no S3.  ABD: Soft non tender. Bowel sounds normal.  Ext: No edema  MS: Adequate ROM spine, shoulders, hips and marked swelling with reduced ROM right knee.  Skin: Intact, no ulcerations or rash noted.  Psych: Good eye contact, normal affect. Memory intact not anxious or depressed appearing.  CNS: CN 2-12 intact, power, tone and sensation normal throughout.        Assessment & Plan:

## 2012-06-18 NOTE — Assessment & Plan Note (Signed)
Mil;dly improved but still has significant symptoms , needs ortho eval , pt now agrees

## 2012-06-18 NOTE — Assessment & Plan Note (Signed)
Unchanged. Patient re-educated about  the importance of commitment to a  minimum of 150 minutes of exercise per week. The importance of healthy food choices with portion control discussed. Encouraged to start a food diary, count calories and to consider  joining a support group. Sample diet sheets offered. Goals set by the patient for the next several months.    

## 2012-07-07 ENCOUNTER — Encounter: Payer: Self-pay | Admitting: Orthopedic Surgery

## 2012-07-07 ENCOUNTER — Ambulatory Visit (INDEPENDENT_AMBULATORY_CARE_PROVIDER_SITE_OTHER): Payer: PRIVATE HEALTH INSURANCE | Admitting: Orthopedic Surgery

## 2012-07-07 VITALS — BP 106/80 | Ht 60.25 in | Wt 174.0 lb

## 2012-07-07 DIAGNOSIS — M7650 Patellar tendinitis, unspecified knee: Secondary | ICD-10-CM

## 2012-07-07 DIAGNOSIS — M7651 Patellar tendinitis, right knee: Secondary | ICD-10-CM

## 2012-07-07 MED ORDER — PREDNISONE 10 MG PO TABS: 10.0000 mg | ORAL_TABLET | Freq: Three times a day (TID) | ORAL | Status: DC

## 2012-07-07 NOTE — Progress Notes (Signed)
Patient ID: Kelli Spencer, female   DOB: [birth year], 46 y.o.   MRN: [id number] Chief Complaint  Patient presents with  . Follow-up    3 week recheck Right knee    Right knee patellar tendinitis treated with prednisone 3 times a day and knee immobilizer. The patient reports that her symptoms are completely alleviated. Review of systems no catching locking or giving way  Examination reveals that she is ambulating normally. There is no tenderness or swelling she has full range of motion the knee is stable strength is normal the skin is intact shows good distal pulses  Impression resolved patella tendinitis  Resume all normal activities patient is released

## 2012-07-07 NOTE — Patient Instructions (Signed)
activities as tolerated 

## 2012-08-18 ENCOUNTER — Ambulatory Visit: Payer: PRIVATE HEALTH INSURANCE | Admitting: Family Medicine

## 2012-09-07 ENCOUNTER — Telehealth: Payer: Self-pay | Admitting: Family Medicine

## 2012-09-07 ENCOUNTER — Other Ambulatory Visit: Payer: Self-pay | Admitting: Family Medicine

## 2012-09-07 DIAGNOSIS — I10 Essential (primary) hypertension: Secondary | ICD-10-CM

## 2012-09-08 ENCOUNTER — Other Ambulatory Visit: Payer: Self-pay | Admitting: Family Medicine

## 2012-09-08 MED ORDER — TRIAMTERENE-HCTZ 37.5-25 MG PO CAPS: 1.0000 | ORAL_CAPSULE | Freq: Every day | ORAL | Status: DC

## 2012-09-08 NOTE — Telephone Encounter (Signed)
Sent in

## 2012-10-12 ENCOUNTER — Other Ambulatory Visit: Payer: Self-pay | Admitting: Family Medicine

## 2012-12-15 ENCOUNTER — Ambulatory Visit: Payer: PRIVATE HEALTH INSURANCE | Admitting: Family Medicine

## 2012-12-22 ENCOUNTER — Encounter: Payer: Self-pay | Admitting: Family Medicine

## 2012-12-22 ENCOUNTER — Ambulatory Visit (INDEPENDENT_AMBULATORY_CARE_PROVIDER_SITE_OTHER): Payer: PRIVATE HEALTH INSURANCE | Admitting: Family Medicine

## 2012-12-22 VITALS — BP 106/62 | HR 66 | Resp 18 | Ht 60.25 in | Wt 172.0 lb

## 2012-12-22 DIAGNOSIS — R7303 Prediabetes: Secondary | ICD-10-CM

## 2012-12-22 DIAGNOSIS — E669 Obesity, unspecified: Secondary | ICD-10-CM

## 2012-12-22 DIAGNOSIS — R7309 Other abnormal glucose: Secondary | ICD-10-CM

## 2012-12-22 DIAGNOSIS — I10 Essential (primary) hypertension: Secondary | ICD-10-CM

## 2012-12-22 MED ORDER — TRIAMTERENE-HCTZ 37.5-25 MG PO TABS: 1.0000 | ORAL_TABLET | Freq: Every day | ORAL | Status: DC

## 2012-12-22 NOTE — Patient Instructions (Addendum)
CPE in 6 mbnth, call if you need me before.  Please  get fasting CBC, lipid, chem 7, HBA1C , TSH and VIt D within the next 1 week, you have not had labs since 2012  Blood pressure is low , break the maxzide in HALF instead of taking 1 whole tablet daily, take HALF tablet daily  Keep active and work on weight loss

## 2012-12-26 NOTE — Assessment & Plan Note (Addendum)
Obesity. Patient re-educated about  the importance of commitment to a  minimum of 150 minutes of exercise per week. The importance of healthy food choices with portion control discussed. Encouraged to start a food diary, count calories and to consider  joining a support group. Sample diet sheets offered. Goals set by the patient for the next several months.

## 2012-12-26 NOTE — Assessment & Plan Note (Signed)
Updated lab needed Patient educated about the importance of limiting  Carbohydrate intake , the need to commit to daily physical activity for a minimum of 30 minutes , and to commit weight loss. The fact that changes in all these areas will reduce or eliminate all together the development of diabetes is stressed.    

## 2012-12-26 NOTE — Assessment & Plan Note (Signed)
Over corrected, reduce med dose by half, pt does report occasional light headedness DASH diet and commitment to daily physical activity for a minimum of 30 minutes discussed and encouraged, as a part of hypertension management. The importance of attaining a healthy weight is also discussed.

## 2012-12-26 NOTE — Progress Notes (Signed)
  Subjective:    Patient ID: Kelli Spencer, female    DOB: [birth year], 47 y.o.   MRN: [id number]  HPI The PT is here for follow up and re-evaluation of chronic medical conditions, medication management and review of any available recent lab and radiology data.  Preventive health is updated, specifically  Cancer screening and Immunization.   The PT state that at times she feels light headed, her blood pressure is low when re checked, so there will be a reduction in med dose There are no new concerns.      Review of Systems See HPI Denies recent fever or chills. Denies sinus pressure, nasal congestion, ear pain or sore throat. Denies chest congestion, productive cough or wheezing. Denies chest pains, palpitations and leg swelling Denies abdominal pain, nausea, vomiting,diarrhea or constipation.   Denies dysuria, frequency, hesitancy or incontinence. Denies joint pain, swelling and limitation in mobility. Denies headaches, seizures, numbness, or tingling. Denies depression, anxiety or insomnia. Denies skin break down or rash.        Objective:   Physical Exam Patient alert and oriented and in no cardiopulmonary distress.  HEENT: No facial asymmetry, EOMI, no sinus tenderness,  oropharynx pink and moist.  Neck supple no adenopathy.  Chest: Clear to auscultation bilaterally.  CVS: S1, S2 no murmurs, no S3.  ABD: Soft non tender. Bowel sounds normal.  Ext: No edema  MS: Adequate ROM spine, shoulders, hips and knees.  Skin: Intact, no ulcerations or rash noted.  Psych: Good eye contact, normal affect. Memory intact not anxious or depressed appearing.  CNS: CN 2-12 intact, power, tone and sensation normal throughout.        Assessment & Plan:

## 2013-03-22 ENCOUNTER — Ambulatory Visit (INDEPENDENT_AMBULATORY_CARE_PROVIDER_SITE_OTHER): Payer: PRIVATE HEALTH INSURANCE

## 2013-03-22 ENCOUNTER — Ambulatory Visit (INDEPENDENT_AMBULATORY_CARE_PROVIDER_SITE_OTHER): Payer: PRIVATE HEALTH INSURANCE | Admitting: Orthopedic Surgery

## 2013-03-22 ENCOUNTER — Encounter: Payer: Self-pay | Admitting: Orthopedic Surgery

## 2013-03-22 VITALS — BP 104/68 | Ht 61.0 in | Wt 171.0 lb

## 2013-03-22 DIAGNOSIS — M25569 Pain in unspecified knee: Secondary | ICD-10-CM

## 2013-03-22 DIAGNOSIS — M25562 Pain in left knee: Secondary | ICD-10-CM

## 2013-03-22 DIAGNOSIS — M7650 Patellar tendinitis, unspecified knee: Secondary | ICD-10-CM

## 2013-03-22 DIAGNOSIS — M7652 Patellar tendinitis, left knee: Secondary | ICD-10-CM

## 2013-03-22 MED ORDER — HYDROCODONE-ACETAMINOPHEN 5-325 MG PO TABS: 1.0000 | ORAL_TABLET | ORAL | Status: DC | PRN

## 2013-03-22 MED ORDER — PREDNISONE 10 MG PO KIT: 10.0000 mg | PACK | ORAL | Status: DC

## 2013-03-22 NOTE — Patient Instructions (Addendum)
Prednisone  Knee brace  norco  OOW x 4 weeks

## 2013-03-23 ENCOUNTER — Encounter: Payer: Self-pay | Admitting: Orthopedic Surgery

## 2013-03-23 DIAGNOSIS — G8929 Other chronic pain: Secondary | ICD-10-CM | POA: Insufficient documentation

## 2013-03-23 NOTE — Progress Notes (Signed)
Patient ID: Kelli Spencer, female   DOB: [birth year], 47 y.o.   MRN: [id number] Chief Complaint  Patient presents with  . Knee Pain    Pain in both knees but left knee is worse    History this is a 47 year old female who is employed at a [employer] who presents with a previous history of right patella tendinitis and similar symptoms in her left knee. Time 2 months, no trauma. Quality sharp throbbing pain anterior knee. Rates pain 10 out of 10. Pain is constant morning and night. Associated with knee swelling. Denies catching locking giving way.  Review of systems all 14 systems were reviewed all systems were negative except for gastrointestinal with heartburn and musculoskeletal with swelling  Past Medical History  Diagnosis Date  . Obesity   . Diastolic hypertension   . Migraines    Past Surgical History  Procedure Laterality Date  . Partial hysterectomy    . Abdominal hysterectomy      BP 104/68  Ht 5\' 1"  (1.549 m)  Wt 171 lb (77.565 kg)  BMI 32.33 kg/m2  Body habitus mesomorphic otherwise well-groomed she is oriented x3. Mood and affect are normal. Gait and station are antalgic favoring the left side with a limp but decreased stride length and decreased cadence. Left knee tender over the patella tendon joint lines nontender ankle range of motion after 90 of flexion. All ligaments are stable. Muscle tone and strength are normal skin is intact shows good pulse and sensation is normal.  X-ray shows no acute fracture  Impression patellar tendinitis  Plan immobilization rest steroids Norco for pain as we did before she is out of work may call in 2-3 weeks if she's feeling better to get a new work note to return to work

## 2013-04-01 ENCOUNTER — Telehealth: Payer: Self-pay | Admitting: Family Medicine

## 2013-04-01 NOTE — Telephone Encounter (Signed)
Pt aware she can come collect letter

## 2013-04-06 ENCOUNTER — Telehealth: Payer: Self-pay | Admitting: Family Medicine

## 2013-04-06 NOTE — Telephone Encounter (Signed)
Patient aware.

## 2013-04-06 NOTE — Telephone Encounter (Signed)
Advise her to try OTC prilosec once daily, if this does not work will need to reschedule and come in, she may need gallbladder tests done, if she has her gallbladder in still

## 2013-04-12 ENCOUNTER — Other Ambulatory Visit: Payer: Self-pay | Admitting: Family Medicine

## 2013-04-19 ENCOUNTER — Ambulatory Visit (INDEPENDENT_AMBULATORY_CARE_PROVIDER_SITE_OTHER): Payer: PRIVATE HEALTH INSURANCE | Admitting: Orthopedic Surgery

## 2013-04-19 ENCOUNTER — Encounter: Payer: Self-pay | Admitting: Orthopedic Surgery

## 2013-04-19 VITALS — BP 138/90 | Ht 61.0 in | Wt 171.0 lb

## 2013-04-19 DIAGNOSIS — M7650 Patellar tendinitis, unspecified knee: Secondary | ICD-10-CM

## 2013-04-19 DIAGNOSIS — M7652 Patellar tendinitis, left knee: Secondary | ICD-10-CM

## 2013-04-19 NOTE — Patient Instructions (Signed)
RTW per patient request

## 2013-04-19 NOTE — Progress Notes (Signed)
Patient ID: Kelli Spencer, female   DOB: [birth year], 47 y.o.   MRN: [id number] Chief Complaint  Patient presents with  . Follow-up    4 week recheck left knee.    BP 138/90  Ht 5\' 1"  (1.549 m)  Wt 171 lb (77.565 kg)  BMI 32.33 kg/m2  Encounter Diagnosis  Name Primary?  . Patellar tendonitis, left Yes    The patient had patellar tendinitis we treated her with steroids, brace and Norco for pain she did well she's ready to go back to work  She denies catching locking or giving way  Physical exam vital signs are recorded above. On inspection both knees are nontender she has full range of motion both knees are stable moderate function is normal skin is intact  Impression patellar tendinitis resolved  Patient returned to work tomorrow

## 2013-04-25 ENCOUNTER — Telehealth: Payer: Self-pay | Admitting: Orthopedic Surgery

## 2013-04-25 ENCOUNTER — Other Ambulatory Visit: Payer: Self-pay | Admitting: *Deleted

## 2013-04-25 DIAGNOSIS — M7652 Patellar tendinitis, left knee: Secondary | ICD-10-CM

## 2013-04-25 DIAGNOSIS — M25562 Pain in left knee: Secondary | ICD-10-CM

## 2013-04-25 MED ORDER — PREDNISONE 10 MG PO KIT: 10.0000 mg | PACK | ORAL | Status: DC

## 2013-04-25 NOTE — Telephone Encounter (Signed)
Ice   Rest  Take dose pack again

## 2013-04-25 NOTE — Telephone Encounter (Signed)
Sent in reorder for dose pack and advised patient of Dr. Mort Sawyers reply

## 2013-04-25 NOTE — Telephone Encounter (Signed)
Kelli Spencer went back to work last Thursday, she worked 8 hours, but her knee started swelling and hurting and on Friday she was only able to work  Half of the day.  She called asking what do you advise she do, does she need to see you again this soon? Her cell # [phone]

## 2013-04-28 ENCOUNTER — Other Ambulatory Visit: Payer: Self-pay

## 2013-04-28 MED ORDER — TRIAMTERENE-HCTZ 37.5-25 MG PO TABS: ORAL_TABLET | ORAL | Status: DC

## 2013-05-05 ENCOUNTER — Emergency Department (HOSPITAL_COMMUNITY)
Admission: EM | Admit: 2013-05-05 | Discharge: 2013-05-05 | Disposition: A | Discharge status: Home / Self Care | Payer: No Typology Code available for payment source | Attending: Emergency Medicine | Admitting: Emergency Medicine

## 2013-05-05 ENCOUNTER — Encounter (HOSPITAL_COMMUNITY): Payer: Self-pay | Admitting: *Deleted

## 2013-05-05 DIAGNOSIS — E669 Obesity, unspecified: Secondary | ICD-10-CM | POA: Insufficient documentation

## 2013-05-05 DIAGNOSIS — I10 Essential (primary) hypertension: Secondary | ICD-10-CM | POA: Insufficient documentation

## 2013-05-05 DIAGNOSIS — M25569 Pain in unspecified knee: Secondary | ICD-10-CM | POA: Insufficient documentation

## 2013-05-05 DIAGNOSIS — Z8679 Personal history of other diseases of the circulatory system: Secondary | ICD-10-CM | POA: Insufficient documentation

## 2013-05-05 DIAGNOSIS — G8929 Other chronic pain: Secondary | ICD-10-CM

## 2013-05-05 DIAGNOSIS — Z79899 Other long term (current) drug therapy: Secondary | ICD-10-CM | POA: Insufficient documentation

## 2013-05-05 MED ORDER — HYDROCODONE-ACETAMINOPHEN 5-325 MG PO TABS: 2.0000 | ORAL_TABLET | Freq: Three times a day (TID) | ORAL | Status: DC | PRN

## 2013-05-05 NOTE — ED Notes (Signed)
States that she is having increased pain in her left knee.  States she has a history of tendonitis and was out of work recently for the same problem, states that she went back to work on 04/20/2013.  States that today her left knee pain increased and is having difficulty ambulating.

## 2013-05-05 NOTE — ED Notes (Signed)
Patient has pain in left knee, states this is an ongoing  Problem related to work injury

## 2013-05-05 NOTE — ED Provider Notes (Signed)
History    CSN: [id number] Arrival date & time 05/05/13  1520  First MD Initiated Contact with Patient 05/05/13 1545     Chief Complaint  Patient presents with  . Knee Pain   (Consider location/radiation/quality/duration/timing/severity/associated sxs/prior Treatment) HPI This 47 year old female has chronic bilateral knee pain and had been at work for about a month and return to work for the last 2 weeks and now is a flare up of left knee pain. She has had bilateral knee pain almost 24/7 daily for months if not years. Even when she was off work for a month she still had bilateral knee pain daily. Since she's been back to work her left knee is what is bothering her now with worse than usual chronic severe pain. She states steroids have not helped in the past. She states work will not let her wear her knee brace. She states the only thing that helps is than the Vicodin that she has been prescribed previously. She out of her Vicodin. She is no new swelling or discoloration to her knee or leg she has no weakness or numbness to the leg she has no locking or giving way or falls or trauma recently. She has no chest pain shortness breath abdominal pain back pain or other concerns. She is no thigh pain or calf pain. She is no ankle pain or foot pain. She has isolated left anterior knee pain mostly infrapatellar region. There is no treatment prior to arrival. Her pain is worse with walking and bending her knee at work. Past Medical History  Diagnosis Date  . Obesity   . Diastolic hypertension   . Migraines    Past Surgical History  Procedure Laterality Date  . Partial hysterectomy    . Abdominal hysterectomy     Family History  Problem Relation Age of Onset  . Kidney failure Mother   . Diabetes Mother   . Hypertension Mother   . Stroke Mother   . Diabetes Father   . Heart disease Father    History  Substance Use Topics  . Smoking status: Never Smoker   . Smokeless tobacco: Not on file  .  Alcohol Use: No   OB History   Grav Para Term Preterm Abortions TAB SAB Ect Mult Living                 Review of Systems 10 Systems reviewed and are negative for acute change except as noted in the HPI. Allergies  Review of patient's allergies indicates no known allergies.  Home Medications   Current Outpatient Rx  Name  Route  Sig  Dispense  Refill  . triamterene-hydrochlorothiazide (MAXZIDE-25) 37.5-25 MG per tablet   Oral   Take 1 tablet by mouth daily.         Marland Kitchen HYDROcodone-acetaminophen (NORCO) 5-325 MG per tablet   Oral   Take 2 tablets by mouth every 8 (eight) hours as needed for pain.   20 tablet   0    BP 122/88  Pulse 88  Temp(Src) 98.2 F (36.8 C) (Oral)  Resp 20  Ht 5' (1.524 m)  Wt 171 lb (77.565 kg)  BMI 33.4 kg/m2  SpO2 99% Physical Exam  Nursing note and vitals reviewed. Constitutional:  Awake, alert, nontoxic appearance.  HENT:  Head: Atraumatic.  Eyes: Right eye exhibits no discharge. Left eye exhibits no discharge.  Neck: Neck supple.  Cardiovascular: Normal rate and regular rhythm.   No murmur heard. Pulmonary/Chest: Effort normal and breath  sounds normal. No respiratory distress. She has no wheezes. She has no rales. She exhibits no tenderness.  Abdominal: Soft. Bowel sounds are normal. She exhibits no distension. There is no tenderness. There is no rebound and no guarding.  Musculoskeletal: She exhibits tenderness. She exhibits no edema.  Baseline ROM, no obvious new focal weakness. Both arms and right leg are currently nontender. Left leg is nontender at the hip thigh calf ankle and foot. Left foot dorsalis pedis pulse intact capillary refill less than 2 seconds normal light touch good strength. Left knee examination is tenderness anteriorly over the infrapatellar region with no tenderness over the quadriceps and no tenderness over the patella itself currently she is no medial or lateral or posterior joint line tenderness. She has negative  Lachman's testing negative McMurray's testing and no laxity with varus or valgus stress testing. She is good full extension and flexes to 90. There is no erythema or increased warmth to the knee.  Neurological: She is alert.  Mental status and motor strength appears baseline for patient and situation.  Skin: No rash noted.  Psychiatric: She has a normal mood and affect.    ED Course  Procedures (including critical care time) Patient informed of clinical course, understand medical decision-making process, and agree with plan. Labs Reviewed - No data to display No results found. 1. Chronic knee pain, left     MDM  I doubt any other EMC precluding discharge at this time including, but not necessarily limited to the following:septic joint.  Hurman Horn, MD 05/06/13 [phone]

## 2013-05-09 ENCOUNTER — Other Ambulatory Visit: Payer: Self-pay

## 2013-05-09 MED ORDER — TRIAMTERENE-HCTZ 37.5-25 MG PO TABS: 1.0000 | ORAL_TABLET | Freq: Every day | ORAL | Status: DC

## 2013-05-19 ENCOUNTER — Encounter: Payer: Self-pay | Admitting: Orthopedic Surgery

## 2013-05-19 ENCOUNTER — Ambulatory Visit (INDEPENDENT_AMBULATORY_CARE_PROVIDER_SITE_OTHER): Payer: PRIVATE HEALTH INSURANCE | Admitting: Orthopedic Surgery

## 2013-05-19 VITALS — BP 110/82 | Ht 61.0 in | Wt 171.0 lb

## 2013-05-19 DIAGNOSIS — M7652 Patellar tendinitis, left knee: Secondary | ICD-10-CM

## 2013-05-19 DIAGNOSIS — M25562 Pain in left knee: Secondary | ICD-10-CM

## 2013-05-19 DIAGNOSIS — M25569 Pain in unspecified knee: Secondary | ICD-10-CM

## 2013-05-19 DIAGNOSIS — M7650 Patellar tendinitis, unspecified knee: Secondary | ICD-10-CM

## 2013-05-19 MED ORDER — PREDNISONE 10 MG PO KIT: 10.0000 mg | PACK | ORAL | Status: DC

## 2013-05-19 MED ORDER — HYDROCODONE-ACETAMINOPHEN 5-325 MG PO TABS: 2.0000 | ORAL_TABLET | Freq: Three times a day (TID) | ORAL | Status: DC | PRN

## 2013-05-19 NOTE — Patient Instructions (Addendum)
Brace  Prednisone  norco  OOW 3 weeks

## 2013-05-19 NOTE — Progress Notes (Signed)
Patient ID: Kelli Spencer, female   DOB: [birth year], 47 y.o.   MRN: [id number] Chief Complaint  Patient presents with  . Knee Pain    Left knee pain and swelling    Chronic history of patella tendinitis presents back with left knee pain and swelling times several days with occasional giving out symptoms which I think is just because of inability to extend the knee.  Review of systems negative for infection or rash  Exam BP 110/82  Ht 5\' 1"  (1.549 m)  Wt 171 lb (77.565 kg)  BMI 32.33 kg/m2 General appearance is normal, the patient is alert and oriented x3 with normal mood and affect. Tenderness over the left knee with warmth and swelling painful range of motion no instability skin normal pulses good sensation normal  She responded well to bracing with immobilization,  Norco for pain    Encounter Diagnoses  Name Primary?  . Patellar tendonitis, left Yes  . Left knee pain     3 week followup out of work 3 weeks

## 2013-06-07 ENCOUNTER — Encounter (HOSPITAL_COMMUNITY): Payer: Self-pay | Admitting: Emergency Medicine

## 2013-06-07 ENCOUNTER — Emergency Department (HOSPITAL_COMMUNITY)
Admission: EM | Admit: 2013-06-07 | Discharge: 2013-06-07 | Disposition: A | Discharge status: Home / Self Care | Payer: No Typology Code available for payment source | Attending: Emergency Medicine | Admitting: Emergency Medicine

## 2013-06-07 ENCOUNTER — Emergency Department (HOSPITAL_COMMUNITY): Payer: No Typology Code available for payment source

## 2013-06-07 DIAGNOSIS — I10 Essential (primary) hypertension: Secondary | ICD-10-CM | POA: Insufficient documentation

## 2013-06-07 DIAGNOSIS — K219 Gastro-esophageal reflux disease without esophagitis: Secondary | ICD-10-CM | POA: Insufficient documentation

## 2013-06-07 DIAGNOSIS — IMO0002 Reserved for concepts with insufficient information to code with codable children: Secondary | ICD-10-CM | POA: Insufficient documentation

## 2013-06-07 DIAGNOSIS — R0602 Shortness of breath: Secondary | ICD-10-CM | POA: Insufficient documentation

## 2013-06-07 DIAGNOSIS — Z8679 Personal history of other diseases of the circulatory system: Secondary | ICD-10-CM | POA: Insufficient documentation

## 2013-06-07 DIAGNOSIS — E669 Obesity, unspecified: Secondary | ICD-10-CM | POA: Insufficient documentation

## 2013-06-07 LAB — CBC WITH DIFFERENTIAL/PLATELET
Basophils Absolute: 0 10*3/uL (ref 0.0–0.1)
Eosinophils Relative: 2 % (ref 0–5)
Lymphocytes Relative: 42 % (ref 12–46)
MCV: 86.4 fL (ref 78.0–100.0)
Platelets: 280 10*3/uL (ref 150–400)
RDW: 12.1 % (ref 11.5–15.5)
WBC: 6.5 10*3/uL (ref 4.0–10.5)

## 2013-06-07 LAB — COMPREHENSIVE METABOLIC PANEL
ALT: 15 U/L (ref 0–35)
AST: 11 U/L (ref 0–37)
Albumin: 4.2 g/dL (ref 3.5–5.2)
CO2: 30 mEq/L (ref 19–32)
Calcium: 10.3 mg/dL (ref 8.4–10.5)
GFR calc non Af Amer: 66 mL/min — ABNORMAL LOW (ref >90)
Sodium: 135 mEq/L (ref 135–145)
Total Protein: 7.8 g/dL (ref 6.0–8.3)

## 2013-06-07 MED ORDER — SODIUM CHLORIDE 0.9 % IV SOLN
Freq: Once | INTRAVENOUS | Status: AC
  Administered 2013-06-07: 12:00:00 via INTRAVENOUS

## 2013-06-07 MED ORDER — GI COCKTAIL ~~LOC~~
30.0000 mL | Freq: Once | ORAL | Status: AC
  Administered 2013-06-07: 12:00:00 via ORAL
  Filled 2013-06-07: qty 30

## 2013-06-07 MED ORDER — FAMOTIDINE IN NACL 20-0.9 MG/50ML-% IV SOLN
20.0000 mg | Freq: Once | INTRAVENOUS | Status: AC
  Administered 2013-06-07: 20 mg via INTRAVENOUS
  Filled 2013-06-07: qty 50

## 2013-06-07 MED ORDER — HYDROCODONE-ACETAMINOPHEN 5-325 MG PO TABS: 2.0000 | ORAL_TABLET | ORAL | Status: DC | PRN

## 2013-06-07 MED ORDER — LANSOPRAZOLE 30 MG PO CPDR: 30.0000 mg | DELAYED_RELEASE_CAPSULE | Freq: Every day | ORAL | Status: DC

## 2013-06-07 NOTE — ED Provider Notes (Signed)
CSN: [id number]     Arrival date & time 06/07/13  1102 History  This chart was scribed for Kelli Spencer, non-physician practitioner working with [name], MD by Bennett Scrape, ED Scribe. This patient was seen in room APA09/APA09 and the patient's care was started at 11:32 AM.    Chief Complaint  Patient presents with  . Chest Pain    Patient is a 47 y.o. female presenting with chest pain. The history is provided by the patient. No language interpreter was used.  Chest Pain Pain location:  Substernal area Pain quality: burning   Radiates to: throat. Pain radiates to the back: no   Timing: daily. Progression:  Unchanged Exacerbated by: eating. Ineffective treatments:  Antacids Associated symptoms: no abdominal pain, no nausea and not vomiting   Risk factors: hypertension   Risk factors: no diabetes mellitus and no smoking     HPI Comments: Kelli Spencer is a 47 y.o. female who presents to the Emergency Department complaining of substernal CP described as burning that radiates into the throat. She states that the pain has been going on for "months" and she has been experiencing episodes daily. The pain is aggravated with eating and she states that she is currently taking Prilosec with no improvement. She reports associated intermittent SOB secondary to pain. She denies abdominal pain. Pt denies smoking and alcohol use. She denies having a h/o cardiac disease or DM. She denies having her cholesterol checked recently. She has a h/o HTN controlled with antihypertensives. Pt denies smoking and alcohol use.  PCP is Dr. Lodema Hong.  Past Medical History  Diagnosis Date  . Obesity   . Diastolic hypertension   . Migraines    Past Surgical History  Procedure Laterality Date  . Partial hysterectomy    . Abdominal hysterectomy     Family History  Problem Relation Age of Onset  . Kidney failure Mother   . Diabetes Mother   . Hypertension Mother   . Stroke Mother   .  Diabetes Father   . Heart disease Father    History  Substance Use Topics  . Smoking status: Never Smoker   . Smokeless tobacco: Not on file  . Alcohol Use: No   No OB history provided.  Review of Systems  Cardiovascular: Positive for chest pain.  Gastrointestinal: Negative for nausea, vomiting and abdominal pain.  All other systems reviewed and are negative.    Allergies  Review of patient's allergies indicates no known allergies.  Home Medications   Current Outpatient Rx  Name  Route  Sig  Dispense  Refill  . HYDROcodone-acetaminophen (NORCO) 5-325 MG per tablet   Oral   Take 2 tablets by mouth every 8 (eight) hours as needed for pain.   60 tablet   1   . PredniSONE 10 MG KIT   Oral   Take 1 kit (10 mg total) by mouth as directed.   1 kit   0     10 mg   12 days   As directed   . triamterene-hydrochlorothiazide (MAXZIDE-25) 37.5-25 MG per tablet   Oral   Take 1 tablet by mouth daily.   90 tablet   1    Triage Vitals: BP 104/82  Pulse 94  Temp(Src) 98.4 F (36.9 C) (Oral)  Resp 22  SpO2 100%  Physical Exam  Nursing note and vitals reviewed. Constitutional: She is oriented to person, place, and time. She appears well-developed and well-nourished. No distress.  HENT:  Head:  Normocephalic and atraumatic.  Eyes: EOM are normal.  Neck: Neck supple. No tracheal deviation present.  Cardiovascular: Normal rate and regular rhythm.   Pulmonary/Chest: Effort normal and breath sounds normal. No respiratory distress. She exhibits no tenderness.  Abdominal: Soft. There is no tenderness.  Musculoskeletal: Normal range of motion.  Neurological: She is alert and oriented to person, place, and time.  Skin: Skin is warm and dry.  Psychiatric: She has a normal mood and affect. Her behavior is normal.    ED Course   Procedures (including critical care time)  Medications  0.9 %  sodium chloride infusion (not administered)  famotidine (PEPCID) IVPB 20 mg (not  administered)  gi cocktail (Maalox,Lidocaine,Donnatal) (not administered)    DIAGNOSTIC STUDIES: Oxygen Saturation is 100% on room air, normal by my interpretation.    COORDINATION OF CARE: 11:35 AM-Discussed treatment plan which includes medications, CXR, CBC panel, CMP and lipase with pt at bedside and pt agreed to plan.   Labs Reviewed - No data to display No results found. 1. GERD (gastroesophageal reflux disease)    Results for orders placed during the hospital encounter of 06/07/13  CBC WITH DIFFERENTIAL      Result Value Range   WBC 6.5  4.0 - 10.5 K/uL   RBC 4.56  3.87 - 5.11 MIL/uL   Hemoglobin 14.3  12.0 - 15.0 g/dL   HCT 96.0  45.4 - 09.8 %   MCV 86.4  78.0 - 100.0 fL   MCH 31.4  26.0 - 34.0 pg   MCHC 36.3 (*) 30.0 - 36.0 g/dL   RDW 11.9  14.7 - 82.9 %   Platelets 280  150 - 400 K/uL   Neutrophils Relative % 48  43 - 77 %   Neutro Abs 3.1  1.7 - 7.7 K/uL   Lymphocytes Relative 42  12 - 46 %   Lymphs Abs 2.7  0.7 - 4.0 K/uL   Monocytes Relative 8  3 - 12 %   Monocytes Absolute 0.5  0.1 - 1.0 K/uL   Eosinophils Relative 2  0 - 5 %   Eosinophils Absolute 0.1  0.0 - 0.7 K/uL   Basophils Relative 0  0 - 1 %   Basophils Absolute 0.0  0.0 - 0.1 K/uL  COMPREHENSIVE METABOLIC PANEL      Result Value Range   Sodium 135  135 - 145 mEq/L   Potassium 3.5  3.5 - 5.1 mEq/L   Chloride 92 (*) 96 - 112 mEq/L   CO2 30  19 - 32 mEq/L   Glucose, Bld 189 (*) 70 - 99 mg/dL   BUN 27 (*) 6 - 23 mg/dL   Creatinine, Ser 5.62  0.50 - 1.10 mg/dL   Calcium 13.0  8.4 - 86.5 mg/dL   Total Protein 7.8  6.0 - 8.3 g/dL   Albumin 4.2  3.5 - 5.2 g/dL   AST 11  0 - 37 U/L   ALT 15  0 - 35 U/L   Alkaline Phosphatase 62  39 - 117 U/L   Total Bilirubin 0.8  0.3 - 1.2 mg/dL   GFR calc non Af Amer 66 (*) >90 mL/min   GFR calc Af Amer 76 (*) >90 mL/min  TROPONIN I      Result Value Range   Troponin I <0.30  <0.30 ng/mL  LIPASE, BLOOD      Result Value Range   Lipase 32  11 - 59 U/L   Dg  Chest 2 View  06/07/2013   *RADIOLOGY REPORT*  Clinical Data: Shortness of breath  CHEST - 2 VIEW  Comparison: None.  Findings: The heart and pulmonary vascularity are within normal limits.  The lungs are free of acute infiltrate or sizable effusion.  Prominent pericardial fat pad is noted on the right.  No acute bony abnormality is seen.  IMPRESSION: No acute abnormality noted.   Original Report Authenticated By: Alcide Clever, M.D.   US Abdomen Complete  06/07/2013   *RADIOLOGY REPORT*  Clinical Data:  Abdominal pain.  COMPLETE ABDOMINAL ULTRASOUND  Comparison:  None.  Findings:  Gallbladder:  No gallstones, gallbladder wall thickening, or pericholecystic fluid.  Common bile duct:   Within normal limits in caliber.  Liver:  Increased echotexture throughout the liver suggesting fatty infiltration.  Area of focal fatty sparing adjacent to the gallbladder fossa.  No focal abnormality or biliary ductal dilatation.  IVC:  Appears normal.  Pancreas:  No focal abnormality seen.  Spleen:  Within normal limits in size and echotexture.  Right Kidney:   Normal in size and parenchymal echogenicity.  No evidence of mass or hydronephrosis.  Left Kidney:  Normal in size and parenchymal echogenicity.  No evidence of mass or hydronephrosis.  Abdominal aorta:  No aneurysm identified.  IMPRESSION: Suspect mild fatty infiltration of the liver.   Original Report Authenticated By: Charlett Nose, M.D.    MDM   Date: 06/07/2013  Rate: 71  Rhythm: normal sinus rhythm  QRS Axis: normal  Intervals: normal  ST/T Wave abnormalities: normal  Conduction Disutrbances:none  Narrative Interpretation:   Old EKG Reviewed: none available    Elson Areas, PA-C 06/07/13 1426

## 2013-06-07 NOTE — ED Provider Notes (Signed)
Medical screening examination/treatment/procedure(s) were performed by non-physician practitioner and as supervising physician I was immediately available for consultation/collaboration.    Jaron Czarnecki J. Anvita Hirata, MD 06/07/13 1515 

## 2013-06-07 NOTE — ED Notes (Signed)
Pt c/o centralized chest. Pt describes pain as burning and states it radiates to throat. Pt has taken meds for acid reflux without relief. Pt also reports SOB, tachypnea, and diaphoresis.

## 2013-06-07 NOTE — ED Notes (Signed)
Pt presents with c/o epigastric/chest pain that has worsened over the past week. Pt states mid sternal pain increases that " takes breath away".  Pt has tachypnea at times, due to " heartburn" and anxiety.  Pt denies radiation of pain and diaphoresis. Denies N/V at this time. EKG completed, PIV placed and rainbow lab work drawn.  Pt also placed on cardiac monitoring. Will continue to monitor

## 2013-06-09 ENCOUNTER — Encounter: Payer: Self-pay | Admitting: Orthopedic Surgery

## 2013-06-09 ENCOUNTER — Ambulatory Visit (INDEPENDENT_AMBULATORY_CARE_PROVIDER_SITE_OTHER): Payer: PRIVATE HEALTH INSURANCE | Admitting: Orthopedic Surgery

## 2013-06-09 VITALS — BP 103/73 | Ht 61.0 in | Wt 171.0 lb

## 2013-06-09 DIAGNOSIS — M7650 Patellar tendinitis, unspecified knee: Secondary | ICD-10-CM

## 2013-06-09 DIAGNOSIS — M7652 Patellar tendinitis, left knee: Secondary | ICD-10-CM

## 2013-06-09 NOTE — Progress Notes (Signed)
Patient ID: Kelli Spencer, female   DOB: [birth year], 47 y.o.   MRN: [id number] Chief Complaint  Patient presents with  . Follow-up    3 week recheck left patella tendonitis    BP 103/73  Ht 5\' 1"  (1.549 m)  Wt 171 lb (77.565 kg)  BMI 32.33 kg/m2  Encounter Diagnoses   Name  Primary?   .  Patellar tendonitis, left  Yes   .  Left knee pain       3 week followup out of work 3 weeks          Encounter-Level Documents:           Electronic signature on 05/19/2013 1:34 PM       Not recorded  Medications Ordered This Encounter      Disp Refills Start End    PredniSONE 10 MG KIT 1 kit 0 05/19/2013      Take 1 kit (10 mg total) by mouth as directed. - Oral    Notes to Pharmacy: 10 mg   12 days   As directed    HYDROcodone-acetaminophen (NORCO) 5-325 MG per tablet 60 tablet 1 05/19/2013      Take 2 tablets by mouth every 8 (eight) hours as needed for pain. - Oral   Encounter Diagnoses   Name  Primary?   .  Patellar tendonitis, left  Yes   .  Left knee pain       3 week followup out of work 3 weeks          Encounter-Level Documents:           Electronic signature on 05/19/2013 1:34 PM       Not recorded  Medications Ordered This Encounter      Disp Refills Start End    PredniSONE 10 MG KIT 1 kit 0 05/19/2013      Take 1 kit (10 mg total) by mouth as directed. - Oral    Notes to Pharmacy: 10 mg   12 days   As directed    HYDROcodone-acetaminophen (NORCO) 5-325 MG per tablet 60 tablet 1 05/19/2013      Take 2 tablets by mouth every 8 (eight) hours as needed for pain. - Oral   Knee Pain        Left knee pain and swelling     Chronic history of patella tendinitis presents back with left knee pain and swelling times several days with occasional giving out symptoms which I think is just because of inability to extend the knee.   Improved on prednisone hydrocodone and rest

## 2013-06-09 NOTE — Patient Instructions (Addendum)
RTW 

## 2013-06-21 ENCOUNTER — Encounter: Payer: Self-pay | Admitting: Family Medicine

## 2013-06-21 ENCOUNTER — Ambulatory Visit (INDEPENDENT_AMBULATORY_CARE_PROVIDER_SITE_OTHER): Payer: PRIVATE HEALTH INSURANCE | Admitting: Family Medicine

## 2013-06-21 VITALS — BP 102/70 | HR 100 | Resp 18 | Ht 60.25 in | Wt 181.0 lb

## 2013-06-21 DIAGNOSIS — H6120 Impacted cerumen, unspecified ear: Secondary | ICD-10-CM

## 2013-06-21 DIAGNOSIS — H6123 Impacted cerumen, bilateral: Secondary | ICD-10-CM

## 2013-06-21 DIAGNOSIS — I10 Essential (primary) hypertension: Secondary | ICD-10-CM

## 2013-06-21 DIAGNOSIS — R7301 Impaired fasting glucose: Secondary | ICD-10-CM

## 2013-06-21 DIAGNOSIS — R7309 Other abnormal glucose: Secondary | ICD-10-CM

## 2013-06-21 DIAGNOSIS — R7303 Prediabetes: Secondary | ICD-10-CM

## 2013-06-21 DIAGNOSIS — M25569 Pain in unspecified knee: Secondary | ICD-10-CM

## 2013-06-21 DIAGNOSIS — E669 Obesity, unspecified: Secondary | ICD-10-CM

## 2013-06-21 DIAGNOSIS — M25562 Pain in left knee: Secondary | ICD-10-CM

## 2013-06-21 DIAGNOSIS — Z0000 Encounter for general adult medical examination without abnormal findings: Secondary | ICD-10-CM

## 2013-06-21 MED ORDER — IBUPROFEN 800 MG PO TABS: ORAL_TABLET | ORAL | Status: AC

## 2013-06-21 NOTE — Patient Instructions (Addendum)
F/u in 6 month, call if you need me before  Ears irrigated today.  HBa1C  today   No change in BP medication  Ibuprofen 800mg  ONE tablet once daily, ONLY if needed, since has side effects , which if taken excessively may damage kidneys, and increase heart disease risk  You really do need to look into other options available to improve your quality of life , so that you can walk, stand, bend and extend your knee without pain and risk of fall. Please call back if you  Decide on a 2nd opinion as far as your knee is concerned so a referral can be made , if you need this

## 2013-06-21 NOTE — Progress Notes (Signed)
  Subjective:    Patient ID: Kelli Spencer, female    DOB: Mar 23, 1966, 47 y.o.   MRN: [id number]  HPI Pt i for annual exam She has ben having a difficult time due to bilateral knee pain and instability with recurrent near falls and buckling. States initially it was the right knee, for the past 7 weeks she had been out of work due to left knee symptoms. Has tried , against medical opinion , to return to work, states she needs and income to support herself and does want to work , if able. Unfortunately, her condition is worsening. She is worried and depressed, but trying the best she can.  Will contact the ortho who has been caring for her re current situation , also considering a 2nd opinion , which is encouraged, due to the severity of her condition. Had to be evaluated in the ED for severe reflux , which was new to her. States marked improvement , currently asymptomatic, on once daily prevacid Review of Systems See HPI Denies recent fever or chills. Denies sinus pressure, nasal congestion, ear pain or sore throat. Denies chest congestion, productive cough or wheezing. Denies chest pains, palpitations and leg swelling Denies abdominal pain, nausea, vomiting,diarrhea or constipation.   Denies dysuria, frequency, hesitancy or incontinence.  Denies headaches, seizures, numbness, or tingling. . Denies skin break down or rash.        Objective:   Physical Exam  Pleasant well nourished female, alert and oriented x 3, in no cardio-pulmonary distress.Severe pain with minor movement,flexion or extension of the left knee Afebrile. HEENT No facial trauma or asymetry. Sinuses non tender.  EOMI, PERTL, fundoscopic exam is normal, no hemorhage or exudate.  External ears normal, tympanic membranes bilateral cerumen. Oropharynx moist, no exudate,fair  dentition. Neck: supple, no adenopathy,JVD or thyromegaly.No bruits.  Chest: Clear to ascultation bilaterally.No crackles or wheezes. Non  tender to palpation  Breast: No asymetry,no masses. No nipple discharge or inversion. No axillary or supraclavicular adenopathy  Cardiovascular system; Heart sounds normal,  S1 and  S2 ,no S3.  No murmur, or thrill. Apical beat not displaced Peripheral pulses normal.  Abdomen: Soft, non tender, no organomegaly or masses palpable.  Patient examined sitting up, unable to lie flat, hence exam limited  Bowel sounds normal. No guarding, tenderness or rebound.  Rectal:  No mass. Guaiac negative stool.  GU: Unable to examine  Musculoskeletal exam: Full ROM of spine, hips , shoulders and markedly reduced in left  knee. Deformity and crepitus in left knee. No muscle wasting or atrophy.   Neurologic: Cranial nerves 2 to 12 intact. Power,  normal throughout. Abnormal gait due to left knee pathology. No tremor.  Skin: Intact, no ulceration, erythema , scaling or rash noted. Pigmentation normal throughout  Psych; Normal mood and affect. Judgement and concentration normal       Assessment & Plan:

## 2013-06-22 ENCOUNTER — Telehealth: Payer: Self-pay | Admitting: Family Medicine

## 2013-06-22 DIAGNOSIS — H6120 Impacted cerumen, unspecified ear: Secondary | ICD-10-CM | POA: Insufficient documentation

## 2013-06-22 DIAGNOSIS — Z0000 Encounter for general adult medical examination without abnormal findings: Secondary | ICD-10-CM | POA: Insufficient documentation

## 2013-06-22 LAB — HEMOGLOBIN A1C
Hgb A1c MFr Bld: 9.4 % — ABNORMAL HIGH (ref <5.7)
Mean Plasma Glucose: 223 mg/dL — ABNORMAL HIGH (ref <117)

## 2013-06-22 NOTE — Assessment & Plan Note (Signed)
Pt reports inability to work. Hd recently been taken pout of work for 7 weeks reportedly, and now she is recently back, has marked painful limitation in movement of the knee and reports recurrent buckling, states her co workers often "catch her" She is to discuss management options so that she can have an improved quality of life She is also considering a 2nd orthopedic  opinion at this time,and will call back for a referal if she decides on this I have encouraged her to ensure she maintains contact with the ortho who has been treating her over time , espescially with regard to ongoing symptoms and her limitation in safe mobility and chronic knee pain. Ibuprofen prescried with limited use for pain management, she states that this helps the most

## 2013-06-22 NOTE — Assessment & Plan Note (Signed)
Annual exam completed as documented. Pt unable to straighten left knee due to pain, limiting exam significantly. Since she has a hysterectomy for benign reasons , pap is not indicated. Rectal exam was negative for palpable mass, and stool was heme negative

## 2013-06-22 NOTE — Assessment & Plan Note (Signed)
Updated lab needed Patient educated about the importance of limiting  Carbohydrate intake , the need to commit to daily physical activity for a minimum of 30 minutes , and to commit weight loss. The fact that changes in all these areas will reduce or eliminate all together the development of diabetes is stressed.    

## 2013-06-22 NOTE — Telephone Encounter (Signed)
appt scheduled for the am , she is aware that she is a new diabetic

## 2013-06-22 NOTE — Assessment & Plan Note (Signed)
Deteriorated. Patient re-educated about  the importance of commitment to a  minimum of 150 minutes of exercise per week. The importance of healthy food choices with portion control discussed. Encouraged to start a food diary, count calories and to consider  joining a support group. Sample diet sheets offered. Goals set by the patient for the next several months.    

## 2013-06-22 NOTE — Assessment & Plan Note (Signed)
Atraumatic succesful ear irrigation bilaterally

## 2013-06-22 NOTE — Assessment & Plan Note (Signed)
Controlled, no change in medication DASH diet and commitment to daily physical activity for a minimum of 30 minutes discussed and encouraged, as a part of hypertension management. The importance of attaining a healthy weight is also discussed.  

## 2013-06-23 ENCOUNTER — Ambulatory Visit (INDEPENDENT_AMBULATORY_CARE_PROVIDER_SITE_OTHER): Payer: PRIVATE HEALTH INSURANCE | Admitting: Family Medicine

## 2013-06-23 DIAGNOSIS — E1169 Type 2 diabetes mellitus with other specified complication: Secondary | ICD-10-CM | POA: Insufficient documentation

## 2013-06-23 DIAGNOSIS — E119 Type 2 diabetes mellitus without complications: Secondary | ICD-10-CM

## 2013-06-23 DIAGNOSIS — E669 Obesity, unspecified: Secondary | ICD-10-CM | POA: Insufficient documentation

## 2013-06-23 MED ORDER — ONETOUCH DELICA LANCETS 33G MISC: 1.0000 | Freq: Two times a day (BID) | Status: DC

## 2013-06-23 MED ORDER — GLUCOSE BLOOD VI STRP: ORAL_STRIP | Status: DC

## 2013-06-23 MED ORDER — GLIPIZIDE ER 5 MG PO TB24: ORAL_TABLET | ORAL | Status: DC

## 2013-06-23 MED ORDER — METFORMIN HCL 500 MG PO TABS: 500.0000 mg | ORAL_TABLET | Freq: Three times a day (TID) | ORAL | Status: DC

## 2013-06-23 NOTE — Patient Instructions (Addendum)
F/u in 2 month, call with questions  Start metformin one twice daily, then increase to one in the mroning and two in the evening after 3 days  Start glipizide one with breakfast every morning  Test blood sugars twice daily , or at least once daily   Goal for fasting blood sugar ranges from 80 to 120 and 2 hours after any meal or at bedtime should be between 130 to 170.   You need to go to class to get more help for managing your diabetes, if you decide you want to go , pls let us know , it is free

## 2013-06-25 ENCOUNTER — Encounter: Payer: Self-pay | Admitting: Family Medicine

## 2013-06-25 DIAGNOSIS — E1121 Type 2 diabetes mellitus with diabetic nephropathy: Secondary | ICD-10-CM | POA: Insufficient documentation

## 2013-06-25 DIAGNOSIS — E1165 Type 2 diabetes mellitus with hyperglycemia: Secondary | ICD-10-CM

## 2013-06-25 NOTE — Progress Notes (Signed)
  Subjective:    Patient ID: Kelli Spencer, female    DOB: Mar 19, 1966, 47 y.o.   MRN: [id number]  HPI Pt brought in due to new diagnosis of diabetes for nurse education , as well as some education by me, and to be started on medication. The importance of carb awareness and limiting carbs , and counting carbs  And eating on a regular schedule Was taught, pt was also encouraged to attend free counseling available on an individual basis for a much longer period, however she declined this, saying she had "too much on her plate" including uncontrolled knee pain.Risk and symptoms of low blood sugar was discussed Nurse demonstrated  Testing , allowed pt to do this herself and  A meter was prvided. Pt appeared  overwhelmed and somewhat distracted, as per visit the day before, her health and financial situation is already very shaky, so unfortunately, this amounts to one other "new" isssue for her to deal with    Review of Systems Denies polyuria, polydipsia or blurred vision    Objective:   Physical Exam  Pt alert and oriented , in no c/p distress.Adeqautely hydrated  Appears worried, concerned and overwhelmed at increasing health challenges      Assessment & Plan:

## 2013-06-25 NOTE — Assessment & Plan Note (Signed)
Patient advised to reduce carb and sweets, commit to regular physical activity, take meds as prescribed, test blood as directed, and attempt to lose weight, to improve blood sugar control.  

## 2013-06-27 ENCOUNTER — Telehealth: Payer: Self-pay | Admitting: Family Medicine

## 2013-06-27 NOTE — Telephone Encounter (Signed)
Pt called in sating that she was low  , fasting sugars were 90 and approx 119, I advised this is good and again revised fasting goal rangee of 80 to 120, and bedtime or 2 hr Pp of 130 to 180  She needs tio call back with meter that her insurance will cover tests strips for Pls insert this on the script written and send in  I did ask her to call nurse back with this info

## 2013-07-20 NOTE — Telephone Encounter (Signed)
Noted  

## 2013-07-26 ENCOUNTER — Encounter: Payer: Self-pay | Admitting: Orthopedic Surgery

## 2013-07-26 ENCOUNTER — Ambulatory Visit (INDEPENDENT_AMBULATORY_CARE_PROVIDER_SITE_OTHER): Payer: PRIVATE HEALTH INSURANCE | Admitting: Orthopedic Surgery

## 2013-07-26 VITALS — BP 109/67 | Ht 61.0 in | Wt 171.0 lb

## 2013-07-26 DIAGNOSIS — M7652 Patellar tendinitis, left knee: Secondary | ICD-10-CM

## 2013-07-26 DIAGNOSIS — M7650 Patellar tendinitis, unspecified knee: Secondary | ICD-10-CM

## 2013-07-26 MED ORDER — HYDROCODONE-ACETAMINOPHEN 5-325 MG PO TABS: 2.0000 | ORAL_TABLET | ORAL | Status: DC | PRN

## 2013-07-26 MED ORDER — PREDNISONE (PAK) 10 MG PO TABS: 10.0000 mg | ORAL_TABLET | Freq: Every day | ORAL | Status: DC

## 2013-07-26 NOTE — Patient Instructions (Addendum)
MRI/CALL RESULTS TO PATIENT   Prednisone   norco   Rest x 12 weeks   OOW 12 weeks   Hinge brace wrap

## 2013-07-27 ENCOUNTER — Telehealth: Payer: Self-pay | Admitting: *Deleted

## 2013-07-27 ENCOUNTER — Encounter: Payer: Self-pay | Admitting: Orthopedic Surgery

## 2013-07-27 NOTE — Progress Notes (Signed)
Patient ID: Kelli Spencer, female   DOB: 10-22-1966, 47 y.o.   MRN: [id number]  Chief Complaint  Patient presents with  . Follow-up    Recheck left knee pain s/p dose pack    History this patient is chronic patellar tendinitis of both knees left greater than right presents with left knee pain again over the front of the knee without any history of trauma. She's not been taking any anti-inflammatories just her pain medication Norco 5 mg to 25 of Tylenol. She says she can't work every time she goes back to work her symptoms worsen. On examination her vital signs are stable BP 109/67  Ht 5\' 1"  (1.549 m)  Wt 171 lb (77.565 kg)  BMI 32.33 kg/m2  She is ambulatory with a slightly antalgic gait favoring the left side her mood is pleasant she is oriented x3 general appearance is normal  Past Medical History  Diagnosis Date  . Obesity   . Diastolic hypertension   . Migraines    Review of systems weakness and giving out symptoms of the left knee. Denies numbness and tingling denies any back pain  There is tenderness and swelling over the patellar tendon with painful flexion of the knee no weakness on extension is stable skin is intact pulses are good sensation is normal  At this point her tendinitis has recurred  The best thing for her is another round of prednisone She can take Norco 03/05/2024 every 4 when necessary pain #120 She needs to rest the knee so she is placed in a brace I think we should do an MRI of her left knee to make sure it is not a mechanical problem in the joint. This is her fourth or fifth time with patellar tendinitis and she is complaining of some giving out symptoms.  Encounter Diagnosis  Name Primary?  . Patellar tendonitis, left Yes

## 2013-07-27 NOTE — Telephone Encounter (Signed)
MRI left knee without contrast CPT code 11914 No pre-cert required for any procedures per Insurance card. MRI scheduled for 07/29/13 at 3:45 pm, patient aware. Dr. Romeo Apple will call patient with her results

## 2013-07-29 ENCOUNTER — Ambulatory Visit (HOSPITAL_COMMUNITY)
Admission: RE | Admit: 2013-07-29 | Discharge: 2013-07-29 | Disposition: A | Discharge status: Home / Self Care | Payer: No Typology Code available for payment source | Source: Ambulatory Visit | Attending: Orthopedic Surgery | Admitting: Orthopedic Surgery

## 2013-07-29 DIAGNOSIS — M7650 Patellar tendinitis, unspecified knee: Secondary | ICD-10-CM | POA: Insufficient documentation

## 2013-07-29 DIAGNOSIS — M25569 Pain in unspecified knee: Secondary | ICD-10-CM | POA: Insufficient documentation

## 2013-07-29 DIAGNOSIS — M23319 Other meniscus derangements, anterior horn of medial meniscus, unspecified knee: Secondary | ICD-10-CM | POA: Insufficient documentation

## 2013-07-29 DIAGNOSIS — M7652 Patellar tendinitis, left knee: Secondary | ICD-10-CM

## 2013-09-06 ENCOUNTER — Ambulatory Visit (INDEPENDENT_AMBULATORY_CARE_PROVIDER_SITE_OTHER): Payer: PRIVATE HEALTH INSURANCE | Admitting: Orthopedic Surgery

## 2013-09-06 VITALS — BP 114/83 | Ht 61.0 in | Wt 171.0 lb

## 2013-09-06 DIAGNOSIS — M7651 Patellar tendinitis, right knee: Secondary | ICD-10-CM

## 2013-09-06 DIAGNOSIS — M7650 Patellar tendinitis, unspecified knee: Secondary | ICD-10-CM

## 2013-09-06 DIAGNOSIS — M7652 Patellar tendinitis, left knee: Secondary | ICD-10-CM

## 2013-09-06 MED ORDER — HYDROCODONE-ACETAMINOPHEN 5-325 MG PO TABS: 2.0000 | ORAL_TABLET | ORAL | Status: DC | PRN

## 2013-09-06 NOTE — Progress Notes (Signed)
Patient ID: Kelli Spencer, female   DOB: February 11, 1966, 47 y.o.   MRN: [id number]  Chief Complaint  Patient presents with  . Follow-up    6 week recheck left patella tendonitis    HISTORY:  History patellar tendinitis treated with bracing steroids and Norco. Patient also decreased activity and is out of work, apply for disability  Comes in with continued pain over the anterior aspect of the knee/patellar tendon. MRI on September 26 showed office fat pad inflammation and some patellar tendinitis where she is symptomatic. It also showed an asymptomatic medial meniscal tear for which she has no symptoms  Review of systems negative  Ambulation is a struggle. She's awake alert and oriented x3 mood and affect are normal  BP 114/83  Ht 5\' 1"  (1.549 m)  Wt 171 lb (77.565 kg)  BMI 32.33 kg/m2  She is tender still at the Hoffa's  fat pad this causes painful range of motion despite having a stable knee skin is intact swelling in Hoffa's fat pad  I recommend she go back in her knee immobilizer for 6 weeks  May need debridement arthroscopically  Meds ordered this encounter  Medications  . HYDROcodone-acetaminophen (NORCO/VICODIN) 5-325 MG per tablet    Sig: Take 2 tablets by mouth every 4 (four) hours as needed.    Dispense:  120 tablet    Refill:  0    Order Specific Question:  Supervising Provider    Answer:  Eber Hong D [[id number]]

## 2013-09-06 NOTE — Patient Instructions (Signed)
Knee immobilizer x 6 weeks   Continue norco 5 mg

## 2013-10-18 ENCOUNTER — Ambulatory Visit: Payer: PRIVATE HEALTH INSURANCE | Admitting: Orthopedic Surgery

## 2013-10-18 ENCOUNTER — Telehealth: Payer: Self-pay | Admitting: Orthopedic Surgery

## 2013-10-18 NOTE — Telephone Encounter (Signed)
Patient called, states needs to hold on her appointment for left knee patella (had been scheduled for today, 10/18/13) due to insurance termed.  States working on trying to get other insurance; said will call back to re-schedule.

## 2013-12-22 ENCOUNTER — Ambulatory Visit: Payer: PRIVATE HEALTH INSURANCE | Admitting: Family Medicine

## 2014-01-16 ENCOUNTER — Ambulatory Visit (INDEPENDENT_AMBULATORY_CARE_PROVIDER_SITE_OTHER): Payer: BC Managed Care – PPO | Admitting: Family Medicine

## 2014-01-16 ENCOUNTER — Encounter: Payer: Self-pay | Admitting: Family Medicine

## 2014-01-16 ENCOUNTER — Encounter (INDEPENDENT_AMBULATORY_CARE_PROVIDER_SITE_OTHER): Payer: Self-pay

## 2014-01-16 VITALS — BP 110/78 | HR 76 | Resp 18 | Ht 60.25 in | Wt 179.1 lb

## 2014-01-16 DIAGNOSIS — E669 Obesity, unspecified: Secondary | ICD-10-CM

## 2014-01-16 DIAGNOSIS — IMO0001 Reserved for inherently not codable concepts without codable children: Secondary | ICD-10-CM

## 2014-01-16 DIAGNOSIS — E1169 Type 2 diabetes mellitus with other specified complication: Secondary | ICD-10-CM

## 2014-01-16 DIAGNOSIS — E119 Type 2 diabetes mellitus without complications: Secondary | ICD-10-CM

## 2014-01-16 DIAGNOSIS — F3289 Other specified depressive episodes: Secondary | ICD-10-CM

## 2014-01-16 DIAGNOSIS — F32A Depression, unspecified: Secondary | ICD-10-CM

## 2014-01-16 DIAGNOSIS — I10 Essential (primary) hypertension: Secondary | ICD-10-CM

## 2014-01-16 DIAGNOSIS — E1165 Type 2 diabetes mellitus with hyperglycemia: Secondary | ICD-10-CM

## 2014-01-16 DIAGNOSIS — E785 Hyperlipidemia, unspecified: Secondary | ICD-10-CM

## 2014-01-16 DIAGNOSIS — F329 Major depressive disorder, single episode, unspecified: Secondary | ICD-10-CM | POA: Insufficient documentation

## 2014-01-16 DIAGNOSIS — IMO0002 Reserved for concepts with insufficient information to code with codable children: Secondary | ICD-10-CM

## 2014-01-16 MED ORDER — FLUOXETINE HCL 10 MG PO TABS: 10.0000 mg | ORAL_TABLET | Freq: Every day | ORAL | Status: DC

## 2014-01-16 NOTE — Progress Notes (Signed)
   Subjective:    Patient ID: Kelli Spencer, female    DOB: 14-May-1966, 48 y.o.   MRN: [id number]  HPI  The PT is here for follow up and re-evaluation of chronic medical conditions, medication management and review of any available recent lab and radiology data.  Preventive health is updated, specifically  Cancer screening and Immunization.   States she has a childhood h/o seizures and when excited this recurrs esp at [place] concerns . The PT denies any adverse reactions to current medications since the last visit.  Struggling mentally and financially, unable to work with no income , does admit to being somewhat depressed, but denies hallucinations, suicidal or homicidal ideation Denies polyuria, polydipsia, blurred vision , or hypoglycemic episodes. Has changed diet, unable to exercise due to knee pan and instability      Review of Systems See HPI Denies recent fever or chills. Denies sinus pressure, nasal congestion, ear pain or sore throat. Denies chest congestion, productive cough or wheezing. Denies chest pains, palpitations and leg swelling Denies abdominal pain, nausea, vomiting,diarrhea or constipation.   Denies dysuria, frequency, hesitancy or incontinence. Denies headaches, seizures, numbness, or tingling.  Denies skin break down or rash.        Objective:   Physical Exam BP 110/78 mmHg  Pulse 76  Resp 18  Ht 5' 0.25" (1.53 m)  Wt 179 lb 1.3 oz (81.23 kg)  BMI 34.70 kg/m2  SpO2 100% Patient alert and oriented and in no cardiopulmonary distress.  HEENT: No facial asymmetry, EOMI,   oropharynx pink and moist.  Neck supple no JVD, no mass.  Chest: Clear to auscultation bilaterally.  CVS: S1, S2 no murmurs, no S3.Regular rate.  ABD: Soft non tender.   Ext: No edema  MS: Adequate ROM spine, shoulders, hips and reduced in  knees.  Skin: Intact, no ulcerations or rash noted.  Psych: Good eye contact, flat affect. Memory intact mildly  anxious and   depressed appearing.  CNS: CN 2-12 intact, power,  normal throughout.no focal deficits noted.        Assessment & Plan:  Essential hypertension Controlled, no change in medication DASH diet and commitment to daily physical activity for a minimum of 30 minutes discussed and encouraged, as a part of hypertension management. The importance of attaining a healthy weight is also discussed.   Diabetes mellitus type 2 in obese Controlled, no change in medication Patient advised to reduce carb and sweets, commit to regular physical activity, take meds as prescribed, test blood as directed, and attempt to lose weight, to improve blood sugar control.   Depression Start anti depressant therapy base don today's exam Pt not suicidal or homicial, life stresses are the overwhelming factor  Obesity (BMI 30.0-34.9) Improved. Pt applauded on succesful weight loss through lifestyle change, and encouraged to continue same. Weight loss goal set for the next several months.

## 2014-01-16 NOTE — Patient Instructions (Addendum)
F/u in 5.5 month, call if you need me before  HBA1C , chem 7 today  Blood pressure is excellent continue same medication.  New medication fluoxertne for depression

## 2014-01-17 LAB — HEMOGLOBIN A1C
Hgb A1c MFr Bld: 6.9 % — ABNORMAL HIGH (ref <5.7)
Mean Plasma Glucose: 151 mg/dL — ABNORMAL HIGH (ref <117)

## 2014-01-17 LAB — BASIC METABOLIC PANEL
BUN: 25 mg/dL — AB (ref 6–23)
CHLORIDE: 99 meq/L (ref 96–112)
CO2: 25 meq/L (ref 19–32)
CREATININE: 1.21 mg/dL — AB (ref 0.50–1.10)
Calcium: 10.7 mg/dL — ABNORMAL HIGH (ref 8.4–10.5)
GLUCOSE: 104 mg/dL — AB (ref 70–99)
POTASSIUM: 4.2 meq/L (ref 3.5–5.3)
Sodium: 139 mEq/L (ref 135–145)

## 2014-01-17 MED ORDER — METFORMIN HCL 500 MG PO TABS: 500.0000 mg | ORAL_TABLET | Freq: Every day | ORAL | Status: DC

## 2014-02-02 ENCOUNTER — Telehealth: Payer: Self-pay | Admitting: Orthopedic Surgery

## 2014-02-02 ENCOUNTER — Ambulatory Visit (INDEPENDENT_AMBULATORY_CARE_PROVIDER_SITE_OTHER): Payer: BC Managed Care – PPO | Admitting: Orthopedic Surgery

## 2014-02-02 ENCOUNTER — Other Ambulatory Visit: Payer: Self-pay | Admitting: *Deleted

## 2014-02-02 VITALS — BP 118/75 | Ht 62.5 in | Wt 176.0 lb

## 2014-02-02 DIAGNOSIS — M23329 Other meniscus derangements, posterior horn of medial meniscus, unspecified knee: Secondary | ICD-10-CM | POA: Insufficient documentation

## 2014-02-02 DIAGNOSIS — M7651 Patellar tendinitis, right knee: Secondary | ICD-10-CM

## 2014-02-02 DIAGNOSIS — M7650 Patellar tendinitis, unspecified knee: Secondary | ICD-10-CM

## 2014-02-02 NOTE — Telephone Encounter (Signed)
Per phone call to [facility] [phone]) there is no preauthorization required for out patient surgery CPT 29881 & 29880 This is per [facility] S/BCBS  02/02/14  4:15 PM

## 2014-02-02 NOTE — Patient Instructions (Signed)
Surgery Thursday 02/09/14 [facility] medial menisectomy

## 2014-02-02 NOTE — Progress Notes (Signed)
Patient ID: Kelli Spencer, female   DOB: [birth year], 48 y.o.   MRN: [id number]  Chief Complaint  Patient presents with  . Follow-up    left knee patella    The patient continues to complain of medial knee pain and specifically she is having some catching and giving way symptoms. She had an MRI shows a medial meniscal tearing with some problems around the patella however today she specifically tender over the medial joint line and mechanical symptoms are more prominent  System review otherwise negative  Medial joint line tenderness significant positive McMurray sign range of motion has been preserved patella tenderness is absent knee is stable motor exam is normal skin is intact pulses are good sensation is normal  She is agreeable to arthroscopic surgery on her left knee for meniscectomy. I told her she doesn't have to have the surgery but she says that she is unhappy with her knee at this time

## 2014-02-03 ENCOUNTER — Encounter (HOSPITAL_COMMUNITY): Payer: Self-pay | Admitting: Pharmacy Technician

## 2014-02-07 ENCOUNTER — Encounter (HOSPITAL_COMMUNITY)
Admission: RE | Admit: 2014-02-07 | Discharge: 2014-02-07 | Disposition: A | Discharge status: Home / Self Care | Payer: BC Managed Care – PPO | Source: Ambulatory Visit | Attending: Orthopedic Surgery | Admitting: Orthopedic Surgery

## 2014-02-07 ENCOUNTER — Encounter (HOSPITAL_COMMUNITY): Payer: Self-pay

## 2014-02-07 LAB — BASIC METABOLIC PANEL
BUN: 25 mg/dL — AB (ref 6–23)
CHLORIDE: 98 meq/L (ref 96–112)
CO2: 26 meq/L (ref 19–32)
CREATININE: 1.33 mg/dL — AB (ref 0.50–1.10)
Calcium: 10 mg/dL (ref 8.4–10.5)
GFR calc Af Amer: 53 mL/min — ABNORMAL LOW (ref >90)
GFR calc non Af Amer: 46 mL/min — ABNORMAL LOW (ref >90)
GLUCOSE: 122 mg/dL — AB (ref 70–99)
POTASSIUM: 4.5 meq/L (ref 3.7–5.3)
Sodium: 140 mEq/L (ref 137–147)

## 2014-02-07 LAB — HEMOGLOBIN AND HEMATOCRIT, BLOOD
HCT: 35 % — ABNORMAL LOW (ref 36.0–46.0)
Hemoglobin: 11.8 g/dL — ABNORMAL LOW (ref 12.0–15.0)

## 2014-02-07 NOTE — Patient Instructions (Addendum)
Your procedure is scheduled on: 02/09/2014  Report to [facility] at   7:30  AM.  Call this number if you have problems the morning of surgery: [phone]   Remember:   Do not drink or eat food:After Midnight.  :  Take these medicines the morning of surgery with A SIP OF WATER: Prozac, Prevacid and Maxzide   Do not wear jewelry, make-up or nail polish.  Do not wear lotions, powders, or perfumes. You may wear deodorant.  Do not shave 48 hours prior to surgery. Men may shave face and neck.  Do not bring valuables to the hospital.  Contacts, dentures or bridgework may not be worn into surgery.  Leave suitcase in the car. After surgery it may be brought to your room.  For patients admitted to the hospital, checkout time is 11:00 AM the day of discharge.   Patients discharged the day of surgery will not be allowed to drive home.    Special Instructions: Shower using CHG night before surgery and shower the day of surgery use CHG.  Use special wash - you have one bottle of CHG for all showers.  You should use approximately 1/2 of the bottle for each shower.   Please read over the following fact sheets that you were given: Pain Booklet, MRSA Information, Surgical Site Infection Prevention and Care and Recovery After Surgery  Arthroscopic Procedure, Knee, Care After Refer to this sheet in the next few weeks. These discharge instructions provide you with general information on caring for yourself after you leave the hospital. Your health care provider may also give you specific instructions. Your treatment has been planned according to the most current medical practices available, but unavoidable complications sometimes occur. If you have any problems or questions after discharge, please call your health care provider. HOME CARE INSTRUCTIONS   It is normal to be sore for a couple days after surgery. See your health care provider if this seems to be getting worse rather than better.  Only take  over-the-counter or prescription medicines for pain, discomfort, or fever as directed by your health care provider.  Take showers rather than baths, or as directed by your health care provider.  Change bandages (dressings) if necessary or as directed.  You may resume normal diet and activities as directed or allowed.  Avoid lifting and driving until you are directed otherwise.  Make an appointment to see your health care provider for stitches (suture) or staple removal as directed.  You may put ice on the area.  Put the ice in a plastic bag. Place a towel between your skin and the bag.  Leave the ice on for 15 20 minutes, three to four times per day for the first 2 days.  Elevate the knee above the level of your heart to reduce swelling, and avoid dangling the leg.  Do 10-15 ankle pumps (pointing your toes toward you and then away from you) two to three times daily.  If you are given compression stockings to wear after surgery, use them for as long as your surgeon tells you (around 10-14 days).  Avoid smoking and exposure to second-hand smoke. SEEK MEDICAL CARE IF:   You have increased bleeding from your wounds.  You see redness or swelling or you have increasing pain in your wounds.  You have pus coming from your wound.  You have a fever or persistent symptoms for more than 2 3 days.  You notice a bad smell coming from the wound  or dressing.  You have severe pain with any motion of your knee. SEEK IMMEDIATE MEDICAL CARE IF:   You develop a rash.  You have difficulty breathing.  You develop any reaction or side effects to medicines taken.  You develop pain in the calves or back of the knee.  You develop chest pain, shortness of breath, or difficulty breathing.  You develop numbness or tingling in the leg or foot. MAKE SURE YOU:   Understand these instructions.  Will watch your condition.  Will get help right away if you are not doing well or you get  worse. Document Released: 05/09/2005 Document Revised: 06/22/2013 Document Reviewed: 03/17/2013 [facility] Patient Information 2014 [place], [place].  PATIENT INSTRUCTIONS POST-ANESTHESIA  IMMEDIATELY FOLLOWING SURGERY:  Do not drive or operate machinery for the first twenty four hours after surgery.  Do not make any important decisions for twenty four hours after surgery or while taking narcotic pain medications or sedatives.  If you develop intractable nausea and vomiting or a severe headache please notify your doctor immediately.  FOLLOW-UP:  Please make an appointment with your surgeon as instructed. You do not need to follow up with anesthesia unless specifically instructed to do so.  WOUND CARE INSTRUCTIONS (if applicable):  Keep a dry clean dressing on the anesthesia/puncture wound site if there is drainage.  Once the wound has quit draining you may leave it open to air.  Generally you should leave the bandage intact for twenty four hours unless there is drainage.  If the epidural site drains for more than 36-48 hours please call the anesthesia department.  QUESTIONS?:  Please feel free to call your physician or the hospital operator if you have any questions, and they will be happy to assist you.

## 2014-02-07 NOTE — H&P (Signed)
Kelli Spencer is an 48 y.o. female.   Chief Complaint: Left knee pain and mechanical symptoms  HPI: This is a 48 year old female who was treated for patellar tendinitis over several occasions over the last 6-8 months. She was unable to return to work. Lost her job. Her treatment included anti-inflammatories, hydrocodone, several periods of immobilization, oral steroids and knee exercises. She had an MRI which showed a torn medial meniscus with  Meniscal cysts as well as inflammation of Hoffa's fat pad chronic patellar tendinosis. She has develop more mechanical symptoms of catching locking giving way and now presents for surgical arthroscopy. The risks and benefits of the procedure have been explained to the patient including the postoperative rehabilitation expectations.  Past Medical History  Diagnosis Date  . Obesity   . Diastolic hypertension   . Migraines     Past Surgical History  Procedure Laterality Date  . Partial hysterectomy    . Abdominal hysterectomy      Family History  Problem Relation Age of Onset  . Kidney failure Mother   . Diabetes Mother   . Hypertension Mother   . Stroke Mother   . Diabetes Father   . Heart disease Father    Social History:  reports that she has never smoked. She does not have any smokeless tobacco history on file. She reports that she does not drink alcohol or use illicit drugs.  Allergies:  Allergies  Allergen Reactions  . Prednisone Swelling and Other (See Comments)    Made mouth and throat feel numb    No prescriptions prior to admission    No results found for this or any previous visit (from the past 48 hour(s)). No results found.  Review of Systems  Musculoskeletal: Positive for joint pain.  All other systems reviewed and are negative.    There were no vitals taken for this visit. Physical Exam  General appearance the patient is well-developed well-nourished grooming and hygiene are normal she is mildly overweight. She  is oriented x3 her mood and affect are normal she has a normal gait pattern.  As far as her upper extremities go on inspection she has normal alignment no contractures no dislocations or subluxation normal muscle tone normal skin, normal pulse and perfusion normal sensation without lymphadenopathy and no pathologic reflexes  The right knee has maintained range of motion stability and strength skin is normal neurovascular exam is intact.  As far as her left knee goes she has no patellar tendinitis at this time quadriceps tendon is intact and normal without defect or tenderness parapatellar pain has resolved however she has tenderness over the medial joint line with positive McMurray sign although the knee remains stable. She has good motor tone normal skin alignment is normal. Pulses are intact without peripheral edema sensation is normal. She has no lymphadenopathy in her lower extremities her deep tendon reflexes are normal and she has normal balance   Assessment/Plan MRI findings: IMPRESSION: 1. Moderately motion degraded examination. 2. Horizontal tear of the body and anterior horn of the medial meniscus with parameniscal cysts. 3. Inflammatory changes in the anteromedial aspect of Hoffa's fat pad with central low signal structure. Although nonspecific, the differential considerations are discussed above. Recommend correlation for trauma to the knee. In this location, this could clinically mimic patellar tendinosis. While there is mild patellar tendinosis, it appears relatively mild, particularly in relation to the inflammatory changes of Hoffa's fat pad.  Diagnosis torn medial meniscus Diagnosis patellar tendinitis  The plan at  this time is for arthroscopy of the left knee partial medial meniscectomy evaluation of the patellar tendon from an arthroscopic view.   Arther Abbott 02/07/2014, 9:08 AM

## 2014-02-09 ENCOUNTER — Encounter (HOSPITAL_COMMUNITY): Payer: Self-pay | Admitting: Anesthesiology

## 2014-02-09 ENCOUNTER — Encounter (HOSPITAL_COMMUNITY)
Admission: RE | Disposition: A | Discharge status: Home / Self Care | Payer: Self-pay | Source: Ambulatory Visit | Attending: Orthopedic Surgery

## 2014-02-09 ENCOUNTER — Ambulatory Visit (HOSPITAL_COMMUNITY): Payer: BC Managed Care – PPO | Admitting: Anesthesiology

## 2014-02-09 ENCOUNTER — Encounter (HOSPITAL_COMMUNITY): Payer: BC Managed Care – PPO | Admitting: Anesthesiology

## 2014-02-09 ENCOUNTER — Ambulatory Visit (HOSPITAL_COMMUNITY)
Admission: RE | Admit: 2014-02-09 | Discharge: 2014-02-09 | Disposition: A | Discharge status: Home / Self Care | Payer: BC Managed Care – PPO | Source: Ambulatory Visit | Attending: Orthopedic Surgery | Admitting: Orthopedic Surgery

## 2014-02-09 DIAGNOSIS — I10 Essential (primary) hypertension: Secondary | ICD-10-CM | POA: Insufficient documentation

## 2014-02-09 DIAGNOSIS — F329 Major depressive disorder, single episode, unspecified: Secondary | ICD-10-CM | POA: Insufficient documentation

## 2014-02-09 DIAGNOSIS — M23329 Other meniscus derangements, posterior horn of medial meniscus, unspecified knee: Secondary | ICD-10-CM

## 2014-02-09 DIAGNOSIS — Z79899 Other long term (current) drug therapy: Secondary | ICD-10-CM | POA: Insufficient documentation

## 2014-02-09 DIAGNOSIS — F3289 Other specified depressive episodes: Secondary | ICD-10-CM | POA: Insufficient documentation

## 2014-02-09 DIAGNOSIS — IMO0002 Reserved for concepts with insufficient information to code with codable children: Secondary | ICD-10-CM | POA: Insufficient documentation

## 2014-02-09 DIAGNOSIS — M7651 Patellar tendinitis, right knee: Secondary | ICD-10-CM

## 2014-02-09 DIAGNOSIS — E119 Type 2 diabetes mellitus without complications: Secondary | ICD-10-CM | POA: Insufficient documentation

## 2014-02-09 DIAGNOSIS — X58XXXA Exposure to other specified factors, initial encounter: Secondary | ICD-10-CM | POA: Insufficient documentation

## 2014-02-09 HISTORY — PX: KNEE ARTHROSCOPY WITH MEDIAL MENISECTOMY: SHX5651

## 2014-02-09 LAB — GLUCOSE, CAPILLARY
Glucose-Capillary: 102 mg/dL — ABNORMAL HIGH (ref 70–99)
Glucose-Capillary: 108 mg/dL — ABNORMAL HIGH (ref 70–99)

## 2014-02-09 SURGERY — ARTHROSCOPY, KNEE, WITH MEDIAL MENISCECTOMY
Anesthesia: General | Site: Knee | Laterality: Left

## 2014-02-09 MED ORDER — ONDANSETRON HCL 4 MG/2ML IJ SOLN
4.0000 mg | Freq: Once | INTRAMUSCULAR | Status: AC
  Administered 2014-02-09: 4 mg via INTRAVENOUS
  Filled 2014-02-09: qty 2

## 2014-02-09 MED ORDER — PROMETHAZINE HCL 12.5 MG PO TABS: 12.5000 mg | ORAL_TABLET | Freq: Four times a day (QID) | ORAL | Status: DC | PRN

## 2014-02-09 MED ORDER — MIDAZOLAM HCL 2 MG/2ML IJ SOLN
INTRAMUSCULAR | Status: AC
  Filled 2014-02-09: qty 2

## 2014-02-09 MED ORDER — BUPIVACAINE-EPINEPHRINE PF 0.5-1:200000 % IJ SOLN
INTRAMUSCULAR | Status: DC | PRN
  Administered 2014-02-09: 60 mL

## 2014-02-09 MED ORDER — SODIUM CHLORIDE 0.9 % IR SOLN
Status: DC | PRN
  Administered 2014-02-09 (×5)

## 2014-02-09 MED ORDER — GLYCOPYRROLATE 0.2 MG/ML IJ SOLN
INTRAMUSCULAR | Status: AC
Start: 2014-02-09 — End: 2014-02-09
  Filled 2014-02-09: qty 1

## 2014-02-09 MED ORDER — SODIUM CHLORIDE 0.9 % IR SOLN
Status: DC | PRN
  Administered 2014-02-09: 1000 mL

## 2014-02-09 MED ORDER — CEFAZOLIN SODIUM-DEXTROSE 2-3 GM-% IV SOLR
INTRAVENOUS | Status: AC
  Filled 2014-02-09: qty 50

## 2014-02-09 MED ORDER — GLYCOPYRROLATE 0.2 MG/ML IJ SOLN
0.2000 mg | Freq: Once | INTRAMUSCULAR | Status: AC
Start: 2014-02-09 — End: 2014-02-09
  Administered 2014-02-09: 0.2 mg via INTRAVENOUS

## 2014-02-09 MED ORDER — FENTANYL CITRATE 0.05 MG/ML IJ SOLN
INTRAMUSCULAR | Status: DC | PRN
  Administered 2014-02-09 (×2): 25 ug via INTRAVENOUS
  Administered 2014-02-09: 50 ug via INTRAVENOUS
  Administered 2014-02-09: 25 ug via INTRAVENOUS

## 2014-02-09 MED ORDER — BUPIVACAINE-EPINEPHRINE PF 0.5-1:200000 % IJ SOLN
INTRAMUSCULAR | Status: AC
  Filled 2014-02-09: qty 20

## 2014-02-09 MED ORDER — EPINEPHRINE HCL 1 MG/ML IJ SOLN
INTRAMUSCULAR | Status: AC
  Filled 2014-02-09: qty 1

## 2014-02-09 MED ORDER — HYDROCODONE-ACETAMINOPHEN 7.5-325 MG PO TABS
1.0000 | ORAL_TABLET | Freq: Once | ORAL | Status: AC
  Administered 2014-02-09: 1 via ORAL
  Filled 2014-02-09: qty 1

## 2014-02-09 MED ORDER — LACTATED RINGERS IV SOLN
INTRAVENOUS | Status: DC
  Administered 2014-02-09: 08:00:00 via INTRAVENOUS

## 2014-02-09 MED ORDER — CHLORHEXIDINE GLUCONATE 4 % EX LIQD: 60.0000 mL | Freq: Once | CUTANEOUS | Status: AC

## 2014-02-09 MED ORDER — EPINEPHRINE HCL 1 MG/ML IJ SOLN
INTRAMUSCULAR | Status: AC
  Filled 2014-02-09: qty 3

## 2014-02-09 MED ORDER — SEVOFLURANE IN SOLN
RESPIRATORY_TRACT | Status: AC
  Filled 2014-02-09: qty 250

## 2014-02-09 MED ORDER — MIDAZOLAM HCL 2 MG/2ML IJ SOLN
1.0000 mg | INTRAMUSCULAR | Status: DC | PRN
  Administered 2014-02-09 (×2): 2 mg via INTRAVENOUS

## 2014-02-09 MED ORDER — ONDANSETRON HCL 4 MG/2ML IJ SOLN: 4.0000 mg | Freq: Once | INTRAMUSCULAR | Status: DC | PRN

## 2014-02-09 MED ORDER — FENTANYL CITRATE 0.05 MG/ML IJ SOLN
INTRAMUSCULAR | Status: AC
  Filled 2014-02-09: qty 2

## 2014-02-09 MED ORDER — PROPOFOL 10 MG/ML IV BOLUS
INTRAVENOUS | Status: DC | PRN
  Administered 2014-02-09: 130 mg via INTRAVENOUS

## 2014-02-09 MED ORDER — EPINEPHRINE HCL 1 MG/ML IJ SOLN
INTRAMUSCULAR | Status: AC
  Filled 2014-02-09: qty 2

## 2014-02-09 MED ORDER — HYDROCODONE-ACETAMINOPHEN 10-325 MG PO TABS: 1.0000 | ORAL_TABLET | ORAL | Status: DC | PRN

## 2014-02-09 MED ORDER — PROPOFOL 10 MG/ML IV BOLUS
INTRAVENOUS | Status: AC
  Filled 2014-02-09: qty 20

## 2014-02-09 MED ORDER — FENTANYL CITRATE 0.05 MG/ML IJ SOLN
25.0000 ug | INTRAMUSCULAR | Status: AC
  Administered 2014-02-09 (×2): 25 ug via INTRAVENOUS

## 2014-02-09 MED ORDER — LIDOCAINE HCL 1 % IJ SOLN
INTRAMUSCULAR | Status: DC | PRN
  Administered 2014-02-09: 30 mg via INTRADERMAL

## 2014-02-09 MED ORDER — CEFAZOLIN SODIUM-DEXTROSE 2-3 GM-% IV SOLR
2.0000 g | INTRAVENOUS | Status: AC
  Administered 2014-02-09: 2 g via INTRAVENOUS

## 2014-02-09 MED ORDER — ONDANSETRON HCL 4 MG/2ML IJ SOLN
INTRAMUSCULAR | Status: AC
  Filled 2014-02-09: qty 2

## 2014-02-09 MED ORDER — KETOROLAC TROMETHAMINE 30 MG/ML IJ SOLN
30.0000 mg | Freq: Once | INTRAMUSCULAR | Status: AC
  Administered 2014-02-09: 30 mg via INTRAVENOUS
  Filled 2014-02-09: qty 1

## 2014-02-09 MED ORDER — LIDOCAINE HCL (PF) 1 % IJ SOLN
INTRAMUSCULAR | Status: AC
  Filled 2014-02-09: qty 5

## 2014-02-09 MED ORDER — FENTANYL CITRATE 0.05 MG/ML IJ SOLN
INTRAMUSCULAR | Status: AC
  Filled 2014-02-09: qty 5

## 2014-02-09 MED ORDER — FENTANYL CITRATE 0.05 MG/ML IJ SOLN: 25.0000 ug | INTRAMUSCULAR | Status: DC | PRN

## 2014-02-09 MED ORDER — ONDANSETRON HCL 4 MG/2ML IJ SOLN
4.0000 mg | Freq: Once | INTRAMUSCULAR | Status: AC
  Administered 2014-02-09: 4 mg via INTRAVENOUS

## 2014-02-09 SURGICAL SUPPLY — 55 items
ARTHROWAND PARAGON T2 (SURGICAL WAND)
BAG HAMPER (MISCELLANEOUS) ×3 IMPLANT
BANDAGE ELASTIC 6 VELCRO NS (GAUZE/BANDAGES/DRESSINGS) ×3 IMPLANT
BLADE AGGRESSIVE PLUS 4.0 (BLADE) ×3 IMPLANT
BLADE SURG SZ11 CARB STEEL (BLADE) ×3 IMPLANT
CHLORAPREP W/TINT 26ML (MISCELLANEOUS) ×4 IMPLANT
CLOTH BEACON ORANGE TIMEOUT ST (SAFETY) ×3 IMPLANT
COOLER CRYO IC GRAV AND TUBE (ORTHOPEDIC SUPPLIES) ×3 IMPLANT
CUFF CRYO KNEE LG 20X31 COOLER (ORTHOPEDIC SUPPLIES) IMPLANT
CUFF CRYO KNEE18X23 MED (MISCELLANEOUS) ×2 IMPLANT
CUFF TOURNIQUET SINGLE 34IN LL (TOURNIQUET CUFF) ×2 IMPLANT
CUFF TOURNIQUET SINGLE 44IN (TOURNIQUET CUFF) IMPLANT
CUTTER ANGLED DBL BITE 4.5 (BURR) IMPLANT
DECANTER SPIKE VIAL GLASS SM (MISCELLANEOUS) ×6 IMPLANT
GAUZE SPONGE 4X4 16PLY XRAY LF (GAUZE/BANDAGES/DRESSINGS) ×3 IMPLANT
GAUZE XEROFORM 5X9 LF (GAUZE/BANDAGES/DRESSINGS) ×3 IMPLANT
GLOVE BIOGEL PI IND STRL 7.5 (GLOVE) IMPLANT
GLOVE BIOGEL PI INDICATOR 7.5 (GLOVE) ×2
GLOVE ECLIPSE 7.0 STRL STRAW (GLOVE) ×2 IMPLANT
GLOVE EXAM NITRILE MD LF STRL (GLOVE) ×2 IMPLANT
GLOVE SKINSENSE NS SZ8.0 LF (GLOVE) ×2
GLOVE SKINSENSE STRL SZ8.0 LF (GLOVE) ×1 IMPLANT
GLOVE SS N UNI LF 8.5 STRL (GLOVE) ×3 IMPLANT
GOWN STRL REUS W/TWL LRG LVL3 (GOWN DISPOSABLE) ×5 IMPLANT
GOWN STRL REUS W/TWL XL LVL3 (GOWN DISPOSABLE) ×3 IMPLANT
HLDR LEG FOAM (MISCELLANEOUS) ×1 IMPLANT
IV NS IRRIG 3000ML ARTHROMATIC (IV SOLUTION) ×12 IMPLANT
KIT BLADEGUARD II DBL (SET/KITS/TRAYS/PACK) ×3 IMPLANT
KIT ROOM TURNOVER AP CYSTO (KITS) ×3 IMPLANT
LEG HOLDER FOAM (MISCELLANEOUS) ×2
MANIFOLD NEPTUNE II (INSTRUMENTS) ×3 IMPLANT
MARKER SKIN DUAL TIP RULER LAB (MISCELLANEOUS) ×3 IMPLANT
NDL HYPO 18GX1.5 BLUNT FILL (NEEDLE) ×1 IMPLANT
NDL HYPO 21X1.5 SAFETY (NEEDLE) ×1 IMPLANT
NDL SPNL 18GX3.5 QUINCKE PK (NEEDLE) ×1 IMPLANT
NEEDLE HYPO 18GX1.5 BLUNT FILL (NEEDLE) ×3 IMPLANT
NEEDLE HYPO 21X1.5 SAFETY (NEEDLE) ×3 IMPLANT
NEEDLE SPNL 18GX3.5 QUINCKE PK (NEEDLE) ×3 IMPLANT
NS IRRIG 1000ML POUR BTL (IV SOLUTION) ×3 IMPLANT
PACK ARTHRO LIMB DRAPE STRL (MISCELLANEOUS) ×1 IMPLANT
PAD ABD 5X9 TENDERSORB (GAUZE/BANDAGES/DRESSINGS) ×3 IMPLANT
PAD ARMBOARD 7.5X6 YLW CONV (MISCELLANEOUS) ×3 IMPLANT
PADDING CAST COTTON 6X4 STRL (CAST SUPPLIES) ×3 IMPLANT
PADDING WEBRIL 6 STERILE (GAUZE/BANDAGES/DRESSINGS) ×2 IMPLANT
SET ARTHROSCOPY INST (INSTRUMENTS) ×3 IMPLANT
SET ARTHROSCOPY PUMP TUBE (IRRIGATION / IRRIGATOR) ×3 IMPLANT
SET BASIN LINEN APH (SET/KITS/TRAYS/PACK) ×3 IMPLANT
SPONGE GAUZE 4X4 12PLY (GAUZE/BANDAGES/DRESSINGS) ×3 IMPLANT
SUT ETHILON 3 0 FSL (SUTURE) ×2 IMPLANT
SYR 30ML LL (SYRINGE) ×3 IMPLANT
SYRINGE 10CC LL (SYRINGE) ×3 IMPLANT
WAND 50 DEG COVAC W/CORD (SURGICAL WAND) IMPLANT
WAND 90 DEG TURBOVAC W/CORD (SURGICAL WAND) IMPLANT
WAND ARTHRO PARAGON T2 (SURGICAL WAND) IMPLANT
YANKAUER SUCT BULB TIP 10FT TU (MISCELLANEOUS) ×9 IMPLANT

## 2014-02-09 NOTE — Transfer of Care (Signed)
Immediate Anesthesia Transfer of Care Note  Patient: Kelli Spencer  Procedure(s) Performed: Procedure(s): KNEE ARTHROSCOPY WITH MEDIAL MENISECTOMY (Left)  Patient Location: PACU  Anesthesia Type:General  Level of Consciousness: awake, alert  and oriented  Airway & Oxygen Therapy: Patient Spontanous Breathing and Patient connected to face mask oxygen  Post-op Assessment: Report given to PACU RN  Post vital signs: Reviewed and stable  Complications: No apparent anesthesia complications

## 2014-02-09 NOTE — Op Note (Signed)
02/09/2014  10:08 AM  PATIENT:  Kelli Spencer  48 y.o. female  PRE-OPERATIVE DIAGNOSIS:  Left knee medial meniscal tear  POST-OPERATIVE DIAGNOSIS:  Left knee medial meniscal tear  PROCEDURE:  Procedure(s): KNEE ARTHROSCOPY WITH MEDIAL MENISECTOMY (Left)  SURGEON:  Surgeon(s) and Role:    * Carole Civil, MD - Primary  PHYSICIAN ASSISTANT:   ASSISTANTS: general  ANESTHESIA:   none  EBL:  Total I/O In: 600 [I.V.:600] Out: 0   BLOOD ADMINISTERED:none  DRAINS: none   LOCAL MEDICATIONS USED:  MARCAINE     SPECIMEN:  No Specimen  DISPOSITION OF SPECIMEN:  N/A  COUNTS:  YES  TOURNIQUET:    DICTATION: .Dragon Dictation  PLAN OF CARE: Discharge to home after PACU  PATIENT DISPOSITION:  PACU - hemodynamically stable.   Delay start of Pharmacological VTE agent (>24hrs) due to surgical blood loss or risk of bleeding: not applicable  Indications for procedure pain mechanical symptoms unresponsive to nonoperative treatment  The patient was identified in the preop holding area as Kelli Spencer  the LEFT  knee was confirmed as a surgical site and marked. The chart was reviewed  The patient was taken to the operating room and  was given appropriate preoperative antibiotic  and general anesthesia was administered. The operative leg (LEFT) was placed in the arthroscopic leg holder, the well leg was placed in a well leg holder  The LEFT leg was then prepped and draped sterile The surgical site was confirmed and the timeout procedure was completed  The lateral portal was injected with Marcaine with epinephrine solution and a stab wound was made. The scope was placed in the lateral portal into the medial compartment. The  Diagnostic portion of the  arthroscopy was completed. A medial portal was established in the same fashion. THE MEDIAL SYNOVIAL TISSUE WAS HYPERTROHIC AND HAD TO BE RESECTED TO GET INTO THE MEDIAL COMPARTMENT. A probe was placed into the joint. The diagnostic  arthroscopy   was COMPLETED using a probe to palpate intra-articular structures  A duckbill forceps was used to morcellized the meniscal tear, the fragments were removed with a motorized shaver. The meniscus was then balanced with an ArthroCare wand 50 probe.  A probe was then used to confirm a stable rim.  The knee was irrigated meniscal fragments remaining were removed. The portals were closed with 3-0 nylon suture. The knee joint was then injected with 45 cc of Marcaine with epinephrine. A sterile dressing was applied followed by an Ace bandage and a Cryo/Cuff.  The patient was extubated and taken to the recovery room in stable condition.

## 2014-02-09 NOTE — Anesthesia Procedure Notes (Signed)
Procedure Name: LMA Insertion Date/Time: 02/09/2014 9:12 AM Performed by: Tressie Stalker E Pre-anesthesia Checklist: Patient identified, Patient being monitored, Emergency Drugs available, Timeout performed and Suction available Patient Re-evaluated:Patient Re-evaluated prior to inductionOxygen Delivery Method: Circle System Utilized Preoxygenation: Pre-oxygenation with 100% oxygen Intubation Type: IV induction Ventilation: Mask ventilation without difficulty LMA: LMA inserted LMA Size: 3.0 Number of attempts: 1 Placement Confirmation: positive ETCO2 and breath sounds checked- equal and bilateral

## 2014-02-09 NOTE — Brief Op Note (Signed)
02/09/2014  10:08 AM  PATIENT:  Kelli Spencer  48 y.o. female  PRE-OPERATIVE DIAGNOSIS:  Left knee medial meniscal tear  POST-OPERATIVE DIAGNOSIS:  Left knee medial meniscal tear  PROCEDURE:  Procedure(s): KNEE ARTHROSCOPY WITH MEDIAL MENISECTOMY (Left)  SURGEON:  Surgeon(s) and Role:    * Alicia Ackert E Lyssa Hackley, MD - Primary  PHYSICIAN ASSISTANT:   ASSISTANTS: general  ANESTHESIA:   none  EBL:  Total I/O In: 600 [I.V.:600] Out: 0   BLOOD ADMINISTERED:none  DRAINS: none   LOCAL MEDICATIONS USED:  MARCAINE     SPECIMEN:  No Specimen  DISPOSITION OF SPECIMEN:  N/A  COUNTS:  YES  TOURNIQUET:    DICTATION: .Dragon Dictation  PLAN OF CARE: Discharge to home after PACU  PATIENT DISPOSITION:  PACU - hemodynamically stable.   Delay start of Pharmacological VTE agent (>24hrs) due to surgical blood loss or risk of bleeding: not applicable  Indications for procedure pain mechanical symptoms unresponsive to nonoperative treatment  The patient was identified in the preop holding area as Kelli Spencer  the LEFT  knee was confirmed as a surgical site and marked. The chart was reviewed  The patient was taken to the operating room and  was given appropriate preoperative antibiotic  and general anesthesia was administered. The operative leg (LEFT) was placed in the arthroscopic leg holder, the well leg was placed in a well leg holder  The LEFT leg was then prepped and draped sterile The surgical site was confirmed and the timeout procedure was completed  The lateral portal was injected with Marcaine with epinephrine solution and a stab wound was made. The scope was placed in the lateral portal into the medial compartment. The  Diagnostic portion of the  arthroscopy was completed. A medial portal was established in the same fashion. THE MEDIAL SYNOVIAL TISSUE WAS HYPERTROHIC AND HAD TO BE RESECTED TO GET INTO THE MEDIAL COMPARTMENT. A probe was placed into the joint. The diagnostic  arthroscopy   was COMPLETED using a probe to palpate intra-articular structures  A duckbill forceps was used to morcellized the meniscal tear, the fragments were removed with a motorized shaver. The meniscus was then balanced with an ArthroCare wand 50 probe.  A probe was then used to confirm a stable rim.  The knee was irrigated meniscal fragments remaining were removed. The portals were closed with 3-0 nylon suture. The knee joint was then injected with 45 cc of Marcaine with epinephrine. A sterile dressing was applied followed by an Ace bandage and a Cryo/Cuff.  The patient was extubated and taken to the recovery room in stable condition.  

## 2014-02-09 NOTE — Anesthesia Postprocedure Evaluation (Signed)
  Anesthesia Post-op Note  Patient: Kelli Spencer  Procedure(s) Performed: Procedure(s): KNEE ARTHROSCOPY WITH MEDIAL MENISECTOMY (Left)  Patient Location: PACU  Anesthesia Type:General  Level of Consciousness: awake, alert  and oriented  Airway and Oxygen Therapy: Patient Spontanous Breathing and Patient connected to face mask oxygen  Post-op Pain: none  Post-op Assessment: Post-op Vital signs reviewed  Post-op Vital Signs: Reviewed and stable36.5  Last Vitals:  Filed Vitals:   02/09/14 0900  BP: 95/53  Pulse:   Temp:   Resp: 21    Complications: No apparent anesthesia complications

## 2014-02-09 NOTE — Discharge Instructions (Signed)
Arthroscopic Procedure, Knee, Care After Refer to this sheet in the next few weeks. These discharge instructions provide you with general information on caring for yourself after you leave the hospital. Your health care provider may also give you specific instructions. Your treatment has been planned according to the most current medical practices available, but unavoidable complications sometimes occur. If you have any problems or questions after discharge, please call your health care provider. HOME CARE INSTRUCTIONS   It is normal to be sore for a couple days after surgery. See your health care provider if this seems to be getting worse rather than better.  Only take over-the-counter or prescription medicines for pain, discomfort, or fever as directed by your health care provider.  Take showers rather than baths, or as directed by your health care provider.  Change bandages (dressings) if necessary or as directed.  You may resume normal diet and activities as directed or allowed.  Avoid lifting and driving until you are directed otherwise.  Make an appointment to see your health care provider for stitches (suture) or staple removal as directed.  You may put ice on the area.  Put the ice in a plastic bag. Place a towel between your skin and the bag.  Leave the ice on for 15 20 minutes, three to four times per day for the first 2 days.  Elevate the knee above the level of your heart to reduce swelling, and avoid dangling the leg.  Do 10-15 ankle pumps (pointing your toes toward you and then away from you) two to three times daily.  If you are given compression stockings to wear after surgery, use them for as long as your surgeon tells you (around 10-14 days).  Avoid smoking and exposure to second-hand smoke. SEEK MEDICAL CARE IF:   You have increased bleeding from your wounds.  You see redness or swelling or you have increasing pain in your wounds.  You have pus coming from your  wound.  You have a fever or persistent symptoms for more than 2 3 days.  You notice a bad smell coming from the wound or dressing.  You have severe pain with any motion of your knee. SEEK IMMEDIATE MEDICAL CARE IF:   You develop a rash.  You have difficulty breathing.  You develop any reaction or side effects to medicines taken.  You develop pain in the calves or back of the knee.  You develop chest pain, shortness of breath, or difficulty breathing.  You develop numbness or tingling in the leg or foot. MAKE SURE YOU:   Understand these instructions.  Will watch your condition.  Will get help right away if you are not doing well or you get worse. Document Released: 05/09/2005 Document Revised: 06/22/2013 Document Reviewed: 03/17/2013 [facility] Patient Information 2014 [place], [place].   PATIENT INSTRUCTIONS POST-ANESTHESIA  IMMEDIATELY FOLLOWING SURGERY:  Do not drive or operate machinery for the first twenty four hours after surgery.  Do not make any important decisions for twenty four hours after surgery or while taking narcotic pain medications or sedatives.  If you develop intractable nausea and vomiting or a severe headache please notify your doctor immediately.  FOLLOW-UP:  Please make an appointment with your surgeon as instructed. You do not need to follow up with anesthesia unless specifically instructed to do so.  WOUND CARE INSTRUCTIONS (if applicable):  Keep a dry clean dressing on the anesthesia/puncture wound site if there is drainage.  Once the wound has quit draining you may  leave it open to air.  Generally you should leave the bandage intact for twenty four hours unless there is drainage.  If the epidural site drains for more than 36-48 hours please call the anesthesia department.  QUESTIONS?:  Please feel free to call your physician or the hospital operator if you have any questions, and they will be happy to assist you.

## 2014-02-09 NOTE — Interval H&P Note (Signed)
History and Physical Interval Note:  02/09/2014 8:45 AM  Kelli Spencer  has presented today for surgery, with the diagnosis of Left knee meniscal tear  The various methods of treatment have been discussed with the patient and family. After consideration of risks, benefits and other options for treatment, the patient has consented to  Procedure(s): KNEE ARTHROSCOPY WITH MEDIAL MENISECTOMY (Left) as a surgical intervention .  The patient's history has been reviewed, patient examined, no change in status, stable for surgery.  I have reviewed the patient's chart and labs.  Questions were answered to the patient's satisfaction.     Stanley E Harrison   

## 2014-02-09 NOTE — Interval H&P Note (Signed)
History and Physical Interval Note:  02/09/2014 8:45 AM  Kelli Spencer  has presented today for surgery, with the diagnosis of Left knee meniscal tear  The various methods of treatment have been discussed with the patient and family. After consideration of risks, benefits and other options for treatment, the patient has consented to  Procedure(s): KNEE ARTHROSCOPY WITH MEDIAL MENISECTOMY (Left) as a surgical intervention .  The patient's history has been reviewed, patient examined, no change in status, stable for surgery.  I have reviewed the patient's chart and labs.  Questions were answered to the patient's satisfaction.     Carole Civil

## 2014-02-09 NOTE — Anesthesia Preprocedure Evaluation (Signed)
Anesthesia Evaluation  Patient identified by MRN, date of birth, ID band Patient awake    Reviewed: Allergy & Precautions, H&P , NPO status , Patient's Chart, lab work & pertinent test results  Airway Mallampati: II TM Distance: >3 FB     Dental  (+) Teeth Intact   Pulmonary  breath sounds clear to auscultation        Cardiovascular hypertension, Pt. on medications Rhythm:Regular Rate:Normal     Neuro/Psych  Headaches, PSYCHIATRIC DISORDERS Depression    GI/Hepatic negative GI ROS,   Endo/Other  diabetes, Well Controlled, Type 2, Oral Hypoglycemic Agents  Renal/GU      Musculoskeletal   Abdominal   Peds  Hematology   Anesthesia Other Findings   Reproductive/Obstetrics                           Anesthesia Physical Anesthesia Plan  ASA: II  Anesthesia Plan: General   Post-op Pain Management:    Induction: Intravenous  Airway Management Planned: LMA  Additional Equipment:   Intra-op Plan:   Post-operative Plan: Extubation in OR  Informed Consent: I have reviewed the patients History and Physical, chart, labs and discussed the procedure including the risks, benefits and alternatives for the proposed anesthesia with the patient or authorized representative who has indicated his/her understanding and acceptance.     Plan Discussed with:   Anesthesia Plan Comments:         Anesthesia Quick Evaluation

## 2014-02-10 ENCOUNTER — Encounter (HOSPITAL_COMMUNITY): Payer: Self-pay | Admitting: Orthopedic Surgery

## 2014-02-13 ENCOUNTER — Ambulatory Visit (INDEPENDENT_AMBULATORY_CARE_PROVIDER_SITE_OTHER): Payer: BC Managed Care – PPO | Admitting: Orthopedic Surgery

## 2014-02-13 VITALS — BP 100/65 | Ht 62.5 in | Wt 176.0 lb

## 2014-02-13 DIAGNOSIS — M25562 Pain in left knee: Secondary | ICD-10-CM

## 2014-02-13 DIAGNOSIS — M25569 Pain in unspecified knee: Secondary | ICD-10-CM

## 2014-02-13 DIAGNOSIS — Z9889 Other specified postprocedural states: Secondary | ICD-10-CM

## 2014-02-13 NOTE — Progress Notes (Signed)
Patient came in 02/13/14. Sutures were removed and band aids were applied. Patient did not have crutches or walker. I offered to order them for her, but patient declined at this time. Physical therapy was ordered at [facility]. Patient to follow up in two weeks with Dr. Aline Brochure.

## 2014-02-16 ENCOUNTER — Encounter: Payer: Self-pay | Admitting: Orthopedic Surgery

## 2014-02-23 ENCOUNTER — Ambulatory Visit (HOSPITAL_COMMUNITY)
Admission: RE | Admit: 2014-02-23 | Discharge: 2014-02-23 | Disposition: A | Discharge status: Home / Self Care | Payer: BC Managed Care – PPO | Source: Ambulatory Visit | Attending: Orthopedic Surgery | Admitting: Orthopedic Surgery

## 2014-02-23 DIAGNOSIS — R269 Unspecified abnormalities of gait and mobility: Secondary | ICD-10-CM | POA: Insufficient documentation

## 2014-02-23 DIAGNOSIS — M25669 Stiffness of unspecified knee, not elsewhere classified: Secondary | ICD-10-CM | POA: Insufficient documentation

## 2014-02-23 DIAGNOSIS — E119 Type 2 diabetes mellitus without complications: Secondary | ICD-10-CM | POA: Insufficient documentation

## 2014-02-23 DIAGNOSIS — I10 Essential (primary) hypertension: Secondary | ICD-10-CM | POA: Insufficient documentation

## 2014-02-23 DIAGNOSIS — IMO0001 Reserved for inherently not codable concepts without codable children: Secondary | ICD-10-CM | POA: Insufficient documentation

## 2014-02-23 DIAGNOSIS — M25569 Pain in unspecified knee: Secondary | ICD-10-CM | POA: Insufficient documentation

## 2014-02-23 NOTE — Evaluation (Signed)
Physical Therapy Evaluation  Patient Details  Name: Kelli Spencer MRN: [id number] Date of Birth: 01-16-1966  Today's Date: 02/23/2014 Time: 1015-1100 PT Time Calculation (min): 45 min   Charges: 1 evaluation 1030-1100 therapeutic exercise           Visit#: 1 of 9  Re-eval: 03/25/14 Assessment Diagnosis: Lt knee meniscua repair (arthroscopic) Surgical Date: 02/09/14 Next MD Visit: Dr. Kenton Kingfisher, 02/28/14  Authorization: BCBS    Authorization Time Period:    Authorization Visit#: 1 of 9   Past Medical History:  Past Medical History  Diagnosis Date  . Obesity   . Diastolic hypertension   . Migraines    Past Surgical History:  Past Surgical History  Procedure Laterality Date  . Partial hysterectomy    . Abdominal hysterectomy    . Knee arthroscopy with medial menisectomy Left 02/09/2014    Procedure: KNEE ARTHROSCOPY WITH MEDIAL MENISECTOMY;  Surgeon: Carole Civil, MD;  Location: [facility];  Service: Orthopedics;  Laterality: Left;    Subjective Symptoms/Limitations Symptoms: Patient reports that she has been trying to exercise on her own. Patient reports difficulty standing from chair, difficulty ascendign and descending stairs.  Pertinent History: 02/09/14 L knee meniscus repair. history of Rt knee tendonitis, no How long can you sit comfortably?: none How long can you stand comfortably?: 63minutes.  How long can you walk comfortably?: 3minutes before needign to rest.  Repetition: Increases Symptoms Pain Assessment Currently in Pain?: No/denies Pain Score: 5  Pain Location: Knee Pain Orientation: Left Pain Type: Surgical pain Pain Radiating Towards: anterior shin Pain Onset: 1 to 4 weeks ago Pain Frequency: Intermittent Pain Relieving Factors: rest, stretching.  Effect of Pain on Daily Activities: Unable to perform work that requires patient to stand/walk all day.    Sensation/Coordination/Flexibility/Functional Tests Functional Tests Functional Tests: 3D hip  excursions 10x:limited knee/hip flexion due to pain. Functional Tests: Gait: medial foot arch collapse bilaterally, Lt LE decreased femur and tibia internal rotation Functional Tests: FOTO 46% limited Mobility  Assessment LLE AROM (degrees) Left Hip External Rotation : 20 Left Hip Internal Rotation : 40 Left Knee Extension: -10 Left Knee Flexion: 90 Left Ankle Dorsiflexion: 10 LLE Strength Left Hip Flexion: 4/5 Left Hip Extension: 2+/5 Left Hip ABduction: 3+/5 Left Knee Flexion:  (4+/5) Left Knee Extension: 2+/5 Left Ankle Dorsiflexion: 4/5 Left Ankle Plantar Flexion: 3+/5  Exercise/Treatments Stretches Quad Stretch:  (10x 3seconds, prone) Knee: Self-Stretch to increase Flexion:  (10x 3 seconds) Gastroc Stretch:  (10x 3sec) Standing Other Standing Knee Exercises: 3D hip excursions 10x Supine Quad Sets: 2 sets;10 reps;AROM;Strengthening;Left Bridges: AROM;Both;Strengthening;10 reps (3 second hold) Sidelying Hip ABduction: 10 reps;AROM;Strengthening;Both (straight leg raise)  Physical Therapy Assessment and Plan PT Assessment and Plan Clinical Impression Statement: Patient is a 48 yo female who presents to therapy following Lt knee meniscus repair via arthroscopic surgery with decreased knee range of motion and Lt LE weakness resultign in abnormal gait, difficulty with transfers, and difficulty with prolonged standing needed for working and cooking. Patient's knee ROM currently is 110-0 degrees with knee flexion mobility particularly limited  and qaud strength particularly limited. Other contributing factors include limitations in hip extension, external rotation, internal rotation, and ankle dorsiflexion range of motion, and limited Lt LE strength. patient demosntrated a positive intial response to therapy with improved AROM and and decreased pain following therapy.    Pt will benefit from skilled therapeutic intervention in order to improve on the following deficits: Abnormal  gait;Decreased balance;Decreased range of motion;Decreased strength;Difficulty walking;Pain  Rehab Potential: Good Clinical Impairments Affecting Rehab Potential: Patint is highly motivated PT Frequency: Min 3X/week PT Duration:  (3 weeks) PT Treatment/Interventions: Gait training;Stair training;Therapeutic exercise;Balance training;Therapeutic activities;Functional mobility training;Patient/family education;Manual techniques PT Plan: Initialize standing stretches next session to improve weight bearing, specifically: hip flexion, gastroc, hamstring, groin stretches. Utilize manual techniques as needed to increase joint ROM and decrease swelling,     Goals PT Short Term Goals Time to Complete Short Term Goals: 2 weeks PT Short Term Goal 1: Increase Lt knee AROM to >120 to so patient can perform stand to sit without weight shifting to Rt LE due to knee stiffness PT Short Term Goal 2: Increase Lt knee extension strength to 4/5 so patient and ascend stairs with reciprocal gait pattern PT Short Term Goal 3: Increase bilateral hip extension to >5 degrees to improve stride length and gait mechaics.  PT Long Term Goals Time to Complete Long Term Goals:  (3 weeks) PT Long Term Goal 1: Patient will be able to single leg stand on Lt LE for >15seconds indicating improved balance and hip strength/stability PT Long Term Goal 2: Increase Lt knee extension strength to 5/5 so patient and descend stairs with reciprocal gait pattern Long Term Goal 3: Knee pain <2/10 with descending stairs, walking, and standing >4 hours  Problem List Patient Active Problem List   Diagnosis Date Noted  . Pain in joint, lower leg 02/23/2014  . Stiffness of joint, not elsewhere classified, lower leg 02/23/2014  . Medial meniscus, posterior horn derangement 02/02/2014  . Depression 01/16/2014  . DM (diabetes mellitus), type 2, uncontrolled 06/25/2013  . Type 2 diabetes mellitus 06/23/2013  . Routine general medical examination  at a health care facility 06/22/2013  . Cerumen impaction 06/22/2013  . Left knee pain 03/23/2013  . Patellar tendonitis 03/22/2013  . Patellar tendinitis of right knee 06/16/2012  . Knee pain, acute 05/10/2012  . Hypertension 04/30/2011  . OBESITY 02/10/2008    PT - End of Session Activity Tolerance: Patient tolerated treatment well General Behavior During Therapy: WFL for tasks assessed/performed PT Plan of Care PT Home Exercise Plan: 3D hip excursions, supine gastroc stretch, heel slide AAROM, quad set, bridges, prone quad stretch, sidelaying straight leg raise PT Patient Instructions: perform exercises twice daily Consulted and Agree with Plan of Care: Patient  Leia Alf PT DPT 02/23/2014, 11:34 AM  Physician Documentation Your signature is required to indicate approval of the treatment plan as stated above.  Please sign and either send electronically or make a copy of this report for your files and return this physician signed original.   Please mark one 1.__approve of plan  2. ___approve of plan with the following conditions.   ______________________________                                                          _____________________ Physician Signature  Date  

## 2014-02-28 ENCOUNTER — Ambulatory Visit (HOSPITAL_COMMUNITY)
Admission: RE | Admit: 2014-02-28 | Discharge: 2014-02-28 | Disposition: A | Discharge status: Home / Self Care | Payer: BC Managed Care – PPO | Source: Ambulatory Visit | Attending: Orthopedic Surgery | Admitting: Orthopedic Surgery

## 2014-02-28 ENCOUNTER — Ambulatory Visit (INDEPENDENT_AMBULATORY_CARE_PROVIDER_SITE_OTHER): Payer: BC Managed Care – PPO | Admitting: Orthopedic Surgery

## 2014-02-28 ENCOUNTER — Encounter: Payer: Self-pay | Admitting: Orthopedic Surgery

## 2014-02-28 VITALS — BP 107/75 | Ht 62.5 in | Wt 176.0 lb

## 2014-02-28 DIAGNOSIS — Z9889 Other specified postprocedural states: Secondary | ICD-10-CM

## 2014-02-28 DIAGNOSIS — M23329 Other meniscus derangements, posterior horn of medial meniscus, unspecified knee: Secondary | ICD-10-CM

## 2014-02-28 MED ORDER — HYDROCODONE-ACETAMINOPHEN 7.5-325 MG PO TABS: 1.0000 | ORAL_TABLET | Freq: Four times a day (QID) | ORAL | Status: DC | PRN

## 2014-02-28 NOTE — Progress Notes (Signed)
Physical Therapy Treatment Patient Details  Name: Kelli Spencer MRN: [id number] Date of Birth: [birth year]  Today's Date: 02/28/2014 Time: 9509-3267 PT Time Calculation (min): 40 min Charge: TE 1245-8099  Visit#: 2 of 9  Re-eval: 03/25/14 Assessment Diagnosis: Lt knee meniscua repair (arthroscopic) Surgical Date: 02/09/14 Next MD Visit: Dr. Kenton Kingfisher, 02/28/14  Authorization: BCBS  Authorization Time Period:    Authorization Visit#: 2 of 9   Subjective: Symptoms/Limitations Symptoms: Pt stated pain this morning but has been resolved currently.  Pt compliant with HEP daily. Pain Assessment Currently in Pain?: No/denies  Objective:   Exercise/Treatments Stretches Active Hamstring Stretch: 3 reps;30 seconds;Other (comment) (3 directions on 16 in box) Quad Stretch: 3 reps;30 seconds;Limitations Sports administrator Limitations: prone with rope Hip Flexor Stretch: 3 reps;30 seconds;Limitations Hip Flexor Stretch Limitations: standng with 16 in step Knee: Self-Stretch to increase Flexion: 3 reps;30 seconds;Limitations Knee: Self-Stretch Limitations: on 14 in step Gastroc Stretch: 3 reps;30 seconds;Limitations Gastroc Stretch Limitations: slant board Standing Other Standing Knee Exercises: 3D hip excursions 10x Supine Quad Sets: 2 sets;10 reps;AROM;Strengthening;Left Short Arc Quad Sets: 10 reps Sidelying Hip ABduction: 10 reps;AROM;Strengthening;Both Prone  Hamstring Curl: 10 reps Hip Extension: 10 reps      Physical Therapy Assessment and Plan PT Assessment and Plan Clinical Impression Statement: Began POC to improve knee AROM and strengthening exercises.  Pt demonstrated appropriate techniques with all exercises following vc-ing and demonstration.  Improved extension with tactile cueing to quadriceps and cueing to relax gluteal musculature, AROM 6 degrees extension.  Pt encouraged to apply ice to knee following session for pain and edema control.   PT Plan: Initialize standing  stretches next session to improve weight bearing, specifically: hip flexion, gastroc, hamstring, groin stretches. Utilize manual techniques as needed to increase joint ROM and decrease swelling,     Goals PT Short Term Goals Time to Complete Short Term Goals: 2 weeks PT Short Term Goal 1: Increase Lt knee AROM to >120 to so patient can perform stand to sit without weight shifting to Rt LE due to knee stiffness PT Short Term Goal 1 - Progress: Progressing toward goal PT Short Term Goal 2: Increase Lt knee extension strength to 4/5 so patient and ascend stairs with reciprocal gait pattern PT Short Term Goal 3: Increase bilateral hip extension to >5 degrees to improve stride length and gait mechaics.  PT Short Term Goal 3 - Progress: Progressing toward goal PT Long Term Goals Time to Complete Long Term Goals:  (3 weeks) PT Long Term Goal 1: Patient will be able to single leg stand on Lt LE for >15seconds indicating improved balance and hip strength/stability PT Long Term Goal 2: Increase Lt knee extension strength to 5/5 so patient and descend stairs with reciprocal gait pattern Long Term Goal 3: Knee pain <2/10 with descending stairs, walking, and standing >4 hours  Problem List Patient Active Problem List   Diagnosis Date Noted  . Pain in joint, lower leg 02/23/2014  . Stiffness of joint, not elsewhere classified, lower leg 02/23/2014  . Medial meniscus, posterior horn derangement 02/02/2014  . Depression 01/16/2014  . DM (diabetes mellitus), type 2, uncontrolled 06/25/2013  . Type 2 diabetes mellitus 06/23/2013  . Routine general medical examination at a health care facility 06/22/2013  . Cerumen impaction 06/22/2013  . Left knee pain 03/23/2013  . Patellar tendonitis 03/22/2013  . Patellar tendinitis of right knee 06/16/2012  . Knee pain, acute 05/10/2012  . Hypertension 04/30/2011  . OBESITY 02/10/2008    PT -  End of Session Activity Tolerance: Patient tolerated treatment  well General Behavior During Therapy: [facility] for tasks assessed/performed  GP    Aldona Lento 02/28/2014, 9:51 AM

## 2014-02-28 NOTE — Progress Notes (Signed)
Patient ID: Kelli Spencer, female   DOB: 1966/07/30, 48 y.o.   MRN: [id number]  Post op visit  Chief Complaint  Patient presents with  . Follow-up    Post op #2, SALK, left knee. DOS 02-09-14.    BP 107/75  Ht 5' 2.5" (1.588 m)  Wt 176 lb (79.833 kg)  BMI 31.66 kg/m2  Encounter Diagnoses  Name Primary?  . S/P arthroscopic knee surgery Yes  . Medial meniscus, posterior horn derangement     PRE-OPERATIVE DIAGNOSIS:  Left knee medial meniscal tear  POST-OPERATIVE DIAGNOSIS:  Left knee medial meniscal tear  PROCEDURE:  Procedure(s): KNEE ARTHROSCOPY WITH MEDIAL MENISECTOMY (Left)  Currently in physical therapy doing well has some terminal knee extension issues but passive extension is normal knee flexion approximately 115  Recommend continued physical therapy followup with me in 2 weeks  Change pain medication from 10 mg to 7.5 mg hydrocodone.

## 2014-03-01 ENCOUNTER — Ambulatory Visit (HOSPITAL_COMMUNITY): Payer: BC Managed Care – PPO

## 2014-03-02 ENCOUNTER — Ambulatory Visit (HOSPITAL_COMMUNITY): Payer: BC Managed Care – PPO

## 2014-03-02 ENCOUNTER — Ambulatory Visit (HOSPITAL_COMMUNITY)
Admission: RE | Admit: 2014-03-02 | Discharge: 2014-03-02 | Disposition: A | Discharge status: Home / Self Care | Payer: BC Managed Care – PPO | Source: Ambulatory Visit | Attending: Orthopedic Surgery | Admitting: Orthopedic Surgery

## 2014-03-02 NOTE — Progress Notes (Signed)
Physical Therapy Treatment Patient Details  Name: Kelli Spencer MRN: [id number] Date of Birth: 1966-07-20  Today's Date: 03/02/2014 Time: 6295-2841 PT Time Calculation (min): 50 min Charge: TE [id number], Ice 1015-1025  Visit#: 3 of 9  Re-eval: 03/25/14    Authorization: BC[employer]  Authorization Time Period:    Authorization Visit#: 3 of 9   Subjective: Symptoms/Limitations Symptoms: Complaint with HEP twice daily.  Current pain scale 4/10.  Most difficulty with knee flexion.   Pain Assessment Currently in Pain?: Yes Pain Score: 4  Pain Location: Knee Pain Orientation: Left  Objective:   Exercise/Treatments Stretches Active Hamstring Stretch: 3 reps;30 seconds;Limitations Hip Flexor Stretch: 3 reps;30 seconds;Limitations Hip Flexor Stretch Limitations: standng with 16 in step Gastroc Stretch: 3 reps;30 seconds;Limitations Gastroc Stretch Limitations: slant board Aerobic Stationary Bike: 8' seat 8 for ROM Standing Forward Lunges: Left;10 reps;3 seconds;Limitations Forward Lunges Limitations: on 8in box for ROM and stretngthening Rocker Board: 2 minutes;Limitations Rocker Board Limitations: R/L and A/P Other Standing Knee Exercises: 3D hip excursions 2 sets x 10 Supine Quad Sets: 2 sets;10 reps;AROM;Strengthening;Left Short Arc Quad Sets: 10 reps Heel Slides: Left;10 reps Straight Leg Raises: Left;10 reps  Modalities Modalities: Cryotherapy Cryotherapy Number Minutes Cryotherapy: 10 Minutes Cryotherapy Location: Knee Type of Cryotherapy: Ice pack  Physical Therapy Assessment and Plan PT Assessment and Plan Clinical Impression Statement: Progressed standing exercises to improve weight bearing with static standing and gait.  Rockerboard to improve weight distribution.  Added bike to improve knee flexion AROM seat 8.  Pt lacking full extension with SLR, distal quad strengthening exercises wtith tactile cueing to improve contraction.  Ice applied at end of session for  pain and edema control.   PT Plan: Initialize standing stretches next session to improve weight bearing, specifically: hip flexion, gastroc, hamstring, groin stretches. Utilize manual techniques as needed to increase joint ROM and decrease swelling,     Goals PT Short Term Goals Time to Complete Short Term Goals: 2 weeks PT Short Term Goal 1: Increase Lt knee AROM to >120 to so patient can perform stand to sit without weight shifting to Rt LE due to knee stiffness PT Short Term Goal 1 - Progress: Progressing toward goal PT Short Term Goal 2: Increase Lt knee extension strength to 4/5 so patient and ascend stairs with reciprocal gait pattern PT Short Term Goal 2 - Progress: Progressing toward goal PT Short Term Goal 3: Increase bilateral hip extension to >5 degrees to improve stride length and gait mechaics.  PT Short Term Goal 3 - Progress: Progressing toward goal PT Long Term Goals Time to Complete Long Term Goals:  (3 weeks) PT Long Term Goal 1: Patient will be able to single leg stand on Lt LE for >15seconds indicating improved balance and hip strength/stability PT Long Term Goal 2: Increase Lt knee extension strength to 5/5 so patient and descend stairs with reciprocal gait pattern Long Term Goal 3: Knee pain <2/10 with descending stairs, walking, and standing >4 hours  Problem List Patient Active Problem List   Diagnosis Date Noted  . Pain in joint, lower leg 02/23/2014  . Stiffness of joint, not elsewhere classified, lower leg 02/23/2014  . Medial meniscus, posterior horn derangement 02/02/2014  . Depression 01/16/2014  . DM (diabetes mellitus), type 2, uncontrolled 06/25/2013  . Type 2 diabetes mellitus 06/23/2013  . Routine general medical examination at a health care facility 06/22/2013  . Cerumen impaction 06/22/2013  . Left knee pain 03/23/2013  . Patellar tendonitis 03/22/2013  .  Patellar tendinitis of right knee 06/16/2012  . Knee pain, acute 05/10/2012  . Hypertension  04/30/2011  . OBESITY 02/10/2008    PT - End of Session Activity Tolerance: Patient tolerated treatment well General Behavior During Therapy: [facility] for tasks assessed/performed  GP    Aldona Lento 03/02/2014, 12:15 PM

## 2014-03-06 ENCOUNTER — Ambulatory Visit (HOSPITAL_COMMUNITY): Payer: BC Managed Care – PPO | Admitting: Physical Therapy

## 2014-03-07 ENCOUNTER — Ambulatory Visit (HOSPITAL_COMMUNITY)
Admission: RE | Admit: 2014-03-07 | Discharge: 2014-03-07 | Disposition: A | Discharge status: Home / Self Care | Payer: BC Managed Care – PPO | Source: Ambulatory Visit | Attending: Family Medicine | Admitting: Family Medicine

## 2014-03-07 DIAGNOSIS — M25569 Pain in unspecified knee: Secondary | ICD-10-CM | POA: Insufficient documentation

## 2014-03-07 DIAGNOSIS — M25669 Stiffness of unspecified knee, not elsewhere classified: Secondary | ICD-10-CM | POA: Insufficient documentation

## 2014-03-07 DIAGNOSIS — E119 Type 2 diabetes mellitus without complications: Secondary | ICD-10-CM | POA: Insufficient documentation

## 2014-03-07 DIAGNOSIS — IMO0001 Reserved for inherently not codable concepts without codable children: Secondary | ICD-10-CM | POA: Insufficient documentation

## 2014-03-07 DIAGNOSIS — R269 Unspecified abnormalities of gait and mobility: Secondary | ICD-10-CM | POA: Insufficient documentation

## 2014-03-07 DIAGNOSIS — I10 Essential (primary) hypertension: Secondary | ICD-10-CM | POA: Insufficient documentation

## 2014-03-07 NOTE — Progress Notes (Signed)
Physical Therapy Treatment Patient Details  Name: Kelli Spencer MRN: [id number] Date of Birth: [birth year]  Today's Date: 03/07/2014 Time: 1016-1059 PT Time Calculation (min): 43 min Charges: Therex x 40'  Visit#: 4 of 9  Re-eval: 03/25/14  Authorization: BCBS  Authorization Visit#: 4 of 9   Subjective: Pain Assessment Currently in Pain?: No/denies (After pain pill)  Exercise/Treatments Stretches Active Hamstring Stretch: 3 reps;30 seconds;Limitations Active Hamstring Stretch Limitations: 3 directions Hip Flexor Stretch: 3 reps;30 seconds;Limitations Hip Flexor Stretch Limitations: standng with 16 in step Gastroc Stretch: 3 reps;30 seconds;Limitations Gastroc Stretch Limitations: slant board 3 directions Aerobic Stationary Bike: 8' seat 8 for ROM Standing Forward Lunges: 10 reps;3 seconds;Limitations Forward Lunges Limitations: on 8in box for ROM and stretngthening Terminal Knee Extension: 10 reps;Left;Theraband Lateral Step Up: 10 reps;Left;Step Height: 4" Rocker Board: 2 minutes;Limitations Rocker Board Limitations: R/L and A/P Other Standing Knee Exercises: 3D hip excursions 2 sets x 10  Physical Therapy Assessment and Plan PT Assessment and Plan Clinical Impression Statement: PTA facilitated activities to improve LE stability, ROM and functional strength. Pt requires multimodal cueing to improve form with lateral step ups secondary to decreased eccentric quad control. Pt also requires multimodal cueing to improve control with rocker board and equalize weight shift with squatting. Pt declines ice at end of session. Encouraged pt to ice at home. PT Plan: Continue to progress LE strength, ROM and stability per PT POC. Utilize manual techniques as needed to increase joint ROM and decrease swelling.    Goals    Problem List Patient Active Problem List   Diagnosis Date Noted  . Pain in joint, lower leg 02/23/2014  . Stiffness of joint, not elsewhere classified, lower  leg 02/23/2014  . Medial meniscus, posterior horn derangement 02/02/2014  . Depression 01/16/2014  . DM (diabetes mellitus), type 2, uncontrolled 06/25/2013  . Type 2 diabetes mellitus 06/23/2013  . Routine general medical examination at a health care facility 06/22/2013  . Cerumen impaction 06/22/2013  . Left knee pain 03/23/2013  . Patellar tendonitis 03/22/2013  . Patellar tendinitis of right knee 06/16/2012  . Knee pain, acute 05/10/2012  . Hypertension 04/30/2011  . OBESITY 02/10/2008    PT - End of Session Activity Tolerance: Patient tolerated treatment well General Behavior During Therapy: [facility] for tasks assessed/performed  Rachelle Hora, PTA 03/07/2014, 11:20 AM

## 2014-03-08 ENCOUNTER — Ambulatory Visit (HOSPITAL_COMMUNITY): Payer: BC Managed Care – PPO | Admitting: Physical Therapy

## 2014-03-09 ENCOUNTER — Ambulatory Visit (HOSPITAL_COMMUNITY)
Admission: RE | Admit: 2014-03-09 | Discharge: 2014-03-09 | Disposition: A | Discharge status: Home / Self Care | Payer: BC Managed Care – PPO | Source: Ambulatory Visit | Attending: Family Medicine | Admitting: Family Medicine

## 2014-03-09 NOTE — Progress Notes (Signed)
Physical Therapy Treatment Patient Details  Name: Kelli Spencer MRN: [id number] Date of Birth: 18-Oct-1966  Today's Date: 03/09/2014 Time: 1016-1105 PT Time Calculation (min): 49 min Charges: Therex x 08'(1448-1856) Manual x 3'(1497-0263) Ice x 10'(1055-1155)  Visit#: 5 of 9  Re-eval: 03/25/14  Authorization: BCBS  Authorization Visit#: 5 of 9   Subjective: Symptoms/Limitations Symptoms: Pt states that she was sore this morning from comeplteing HEP yesterday. No pain currenty after taking pain pill. Pain Assessment Currently in Pain?: No/denies  Exercise/Treatments Stretches Active Hamstring Stretch: 3 reps;30 seconds;Limitations Active Hamstring Stretch Limitations: 3 directions on 14" step Hip Flexor Stretch: 3 reps;30 seconds;Limitations Hip Flexor Stretch Limitations: standng with 14 in step Gastroc Stretch: 3 reps;30 seconds;Limitations Gastroc Stretch Limitations: slant board 3 directions Standing Forward Lunges: 10 reps;3 seconds;Limitations Forward Lunges Limitations: on 8in box for ROM and stretngthening Lateral Step Up: 10 reps;Left;Step Height: 4" Rocker Board: 2 minutes;Limitations Rocker Board Limitations: R/L and A/P Supine Knee Flexion: PROM  Manual Therapy Manual Therapy: Other (comment) Other Manual Therapy: Left patella mobilizations all directions; grade II A/P joint mobs to left knee; myofascial release to left knee to decrease adhesions and pain  Cryotherapy Number Minutes Cryotherapy: 10 Minutes Cryotherapy Location: Knee (Left) Type of Cryotherapy: Ice pack  Physical Therapy Assessment and Plan PT Assessment and Plan Clinical Impression Statement: PTA facilitated activities to improve LE stability, ROM and functional strength. Pt continues to struggle with lateral step ups secondary to decrease eccentric quad control. Step height was changed to 2" rather than 4". Manual techniques completed to improve left knee flexion. Ice applied at end of  session to limits pain and inflammation. Pt states that her knee "feels much better"  at end of session.  PT Plan: Continue to progress LE strength, ROM and stability per PT POC. Utilize manual techniques as needed to increase joint ROM and decrease swelling.    Problem List Patient Active Problem List   Diagnosis Date Noted  . Pain in joint, lower leg 02/23/2014  . Stiffness of joint, not elsewhere classified, lower leg 02/23/2014  . Medial meniscus, posterior horn derangement 02/02/2014  . Depression 01/16/2014  . DM (diabetes mellitus), type 2, uncontrolled 06/25/2013  . Type 2 diabetes mellitus 06/23/2013  . Routine general medical examination at a health care facility 06/22/2013  . Cerumen impaction 06/22/2013  . Left knee pain 03/23/2013  . Patellar tendonitis 03/22/2013  . Patellar tendinitis of right knee 06/16/2012  . Knee pain, acute 05/10/2012  . Hypertension 04/30/2011  . OBESITY 02/10/2008    PT - End of Session Activity Tolerance: Patient tolerated treatment well General Behavior During Therapy: [facility] for tasks assessed/performed  Rachelle Hora, PTA 03/09/2014, 1:26 PM

## 2014-03-10 ENCOUNTER — Ambulatory Visit (HOSPITAL_COMMUNITY): Payer: BC Managed Care – PPO

## 2014-03-13 ENCOUNTER — Ambulatory Visit (HOSPITAL_COMMUNITY): Payer: BC Managed Care – PPO | Admitting: Physical Therapy

## 2014-03-14 ENCOUNTER — Ambulatory Visit (HOSPITAL_COMMUNITY)
Admission: RE | Admit: 2014-03-14 | Discharge: 2014-03-14 | Disposition: A | Discharge status: Home / Self Care | Payer: BC Managed Care – PPO | Source: Ambulatory Visit | Attending: Family Medicine | Admitting: Family Medicine

## 2014-03-14 NOTE — Progress Notes (Signed)
Physical Therapy Treatment Patient Details  Name: Kelli Spencer MRN: [id number] Date of Birth: Apr 12, 1966  Today's Date: 03/14/2014 Time: 1015-1102 PT Time Calculation (min): 47 min  Charges: TherEx 1015-1050,  Visit#: 6 of 9  Re-eval: 03/25/14 Assessment Diagnosis: Lt knee meniscua repair (arthroscopic) Surgical Date: 02/09/14 Next MD Visit: Dr. Kenton Kingfisher, 02/28/14  Authorization: BCBS  Authorization Time Period:    Authorization Visit#: 6 of 9   Subjective: Symptoms/Limitations Symptoms: Patient states that her knee Pertinent History: 02/09/14 L knee meniscus repair. history of Rt knee tendonitis, no  Precautions/Restrictions     Exercise/Treatments Stretches Active Hamstring Stretch Limitations: 3 directions on 14" step, 10x 3 seconds Hip Flexor Stretch Limitations: standng with 14 in step 3 way 10x 3sec Gastroc Stretch: 3 reps;Limitations;20 seconds Gastroc Stretch Limitations: slant board 3 directions Aerobic Stationary Bike: 8' seat 7 for ROM Standing Side Lunges: 20 reps (with therapist assistance to increase glut loading. ) Other Standing Knee Exercises: 3D hip excursions 2 sets x 10 Other Standing Knee Exercises: Half kneel to airex pad sagittal plane hip drive stretch 54O  Manual Therapy Other Manual Therapy: Left patella mobilizations all directions; grade II A/P joint mobs to left knee; myofascial release to left knee to decrease adhesions and pain  Physical Therapy Assessment and Plan PT Assessment and Plan Clinical Impression Statement: Patient displays increasing knee ROM  and strenght though patient continues to have difficulty achieving full knee flexion ROM for which this session focused. Patient demonstrated improved flexion following therEx. Patient then performed LE sterengthengin exercises demsontratign significantly  Lt glut limitations compared to Rt resulting in decreased depth with lateral lunging for whcih remainder o sessionf focused on educating  patient on correct performance for HEP PT Treatment/Interventions: Gait training;Stair training;Therapeutic exercise;Balance training;Therapeutic activities;Functional mobility training;Patient/family education;Manual techniques PT Plan: Continue to progress LE strength, ROM and stability,  utilizing manual techniques as needed to increase joint ROM and decrease swelling. Focus on increasing knee flexion ROM and glut strengthenign to imprive knee stability.     Goals PT Short Term Goals PT Short Term Goal 1: Increase Lt knee AROM to >120 to so patient can perform stand to sit without weight shifting to Rt LE due to knee stiffness PT Short Term Goal 1 - Progress: Progressing toward goal PT Short Term Goal 2: Increase Lt knee extension strength to 4/5 so patient and ascend stairs with reciprocal gait pattern PT Short Term Goal 2 - Progress: Progressing toward goal PT Short Term Goal 3: Increase bilateral hip extension to >5 degrees to improve stride length and gait mechaics.  PT Short Term Goal 3 - Progress: Progressing toward goal PT Long Term Goals PT Long Term Goal 1: Patient will be able to single leg stand on Lt LE for >15seconds indicating improved balance and hip strength/stability PT Long Term Goal 1 - Progress: Progressing toward goal PT Long Term Goal 2: Increase Lt knee extension strength to 5/5 so patient and descend stairs with reciprocal gait pattern PT Long Term Goal 2 - Progress: Progressing toward goal Long Term Goal 3: Knee pain <2/10 with descending stairs, walking, and standing >4 hours Long Term Goal 3 Progress: Progressing toward goal  Problem List Patient Active Problem List   Diagnosis Date Noted  . Pain in joint, lower leg 02/23/2014  . Stiffness of joint, not elsewhere classified, lower leg 02/23/2014  . Medial meniscus, posterior horn derangement 02/02/2014  . Depression 01/16/2014  . DM (diabetes mellitus), type 2, uncontrolled 06/25/2013  . Type 2  diabetes  mellitus 06/23/2013  . Routine general medical examination at a health care facility 06/22/2013  . Cerumen impaction 06/22/2013  . Left knee pain 03/23/2013  . Patellar tendonitis 03/22/2013  . Patellar tendinitis of right knee 06/16/2012  . Knee pain, acute 05/10/2012  . Hypertension 04/30/2011  . OBESITY 02/10/2008    PT - End of Session Activity Tolerance: Patient tolerated treatment well General Behavior During Therapy: [facility] for tasks assessed/performed  GP    Devarious Pavek R Faelyn Sigler  PT DPT  03/14/2014, 12:20 PM

## 2014-03-15 ENCOUNTER — Ambulatory Visit (HOSPITAL_COMMUNITY): Payer: BC Managed Care – PPO | Admitting: Physical Therapy

## 2014-03-16 ENCOUNTER — Ambulatory Visit (INDEPENDENT_AMBULATORY_CARE_PROVIDER_SITE_OTHER): Payer: BC Managed Care – PPO | Admitting: Orthopedic Surgery

## 2014-03-16 ENCOUNTER — Ambulatory Visit (HOSPITAL_COMMUNITY)
Admission: RE | Admit: 2014-03-16 | Discharge: 2014-03-16 | Disposition: A | Discharge status: Home / Self Care | Payer: BC Managed Care – PPO | Source: Ambulatory Visit | Attending: Family Medicine | Admitting: Family Medicine

## 2014-03-16 ENCOUNTER — Encounter: Payer: Self-pay | Admitting: Orthopedic Surgery

## 2014-03-16 VITALS — BP 109/68 | Ht 62.5 in | Wt 176.0 lb

## 2014-03-16 DIAGNOSIS — M23329 Other meniscus derangements, posterior horn of medial meniscus, unspecified knee: Secondary | ICD-10-CM

## 2014-03-16 DIAGNOSIS — Z9889 Other specified postprocedural states: Secondary | ICD-10-CM

## 2014-03-16 NOTE — Progress Notes (Signed)
Patient ID: Kelli Spencer, female   DOB: [birth year], 48 y.o.   MRN: [id number] Chief Complaint  Patient presents with  . Follow-up    2 week recheck on left knee. DOS 02-09-14.    Normal range and strength   No complaints  Call or return to clinic prn if these symptoms worsen or fail to improve as anticipated.

## 2014-03-16 NOTE — Patient Instructions (Signed)
activities as tolerated 

## 2014-03-16 NOTE — Progress Notes (Signed)
Physical Therapy Re-evaluation / discharge  Patient Details  Name: Kelli Spencer MRN: [id number] Date of Birth: [birth year]  Today's Date: 03/16/2014 Time: 1020-1057 PT Time Calculation (min): 37 min              Visit#: 7 of 9  Re-eval: 03/25/14 Assessment Diagnosis: Lt knee meniscua repair (arthroscopic) Surgical Date: 02/09/14 Next MD Visit: Dr. Kenton Kingfisher, 02/28/14 Authorization: BCBS    Authorization Visit#: 7 of 9  Charges:  MMT/ROM testing, self care 15'   Subjective Symptoms/Limitations Symptoms: Pt states MD released her .  Pt states she feels she can be done with therapy as well.  Currently without pain.  Pain Assessment Currently in Pain?: No/denies  Sensation/Coordination/Flexibility/Functional Tests Functional Tests Functional Tests: FOTO 72% (was 46% limited Mobility)  Assessment LLE AROM (degrees) Left Knee Extension: 0 (was lacking 10 degrees from neutral) Left Knee Flexion: 115 (was 90 degrees; Rt knee is also 115 degrees) Left Ankle Dorsiflexion: 18 (was 10 degrees) LLE Strength Left Hip Flexion: 5/5 (was 4/5) Left Hip Extension: 5/5 (was 2+/5) Left Hip ABduction: 5/5 (was 3+/5) Left Knee Flexion: 5/5 (was 4+/5) Left Knee Extension: 5/5 (was 2+/5) Left Ankle Dorsiflexion: 5/5 (was 4/5)  Exercise/Treatments Aerobic Stationary Bike: 8' seat 7 for ROM    Physical Therapy Assessment and Plan PT Assessment and Plan Clinical Impression Statement: Pt has completed 7 visits following Lt knee scope.  Pt has progressed well.  Currently with 5/5 Lt LE strength and AROM for Lt knee is WNL.  Pt has met all goals.  Instructed patient to continue HEP and walking.  Pt verbalized understanding.  Pt is ready for discharge to HEP. PT Plan: Recommend discharge to HEP.    Goals PT Short Term Goals PT Short Term Goal 1: Increase Lt knee AROM to >120 to so patient can perform stand to sit without weight shifting to Rt LE due to knee stiffness PT Short Term Goal 1 -  Progress: Met (Pt's normal knee AROM is 115 degrees bilaterally) PT Short Term Goal 2: Increase Lt knee extension strength to 4/5 so patient and ascend stairs with reciprocal gait pattern PT Short Term Goal 2 - Progress: Met PT Short Term Goal 3: Increase bilateral hip extension to >5 degrees to improve stride length and gait mechaics.  PT Short Term Goal 3 - Progress: Met PT Long Term Goals PT Long Term Goal 1: Patient will be able to single leg stand on Lt LE for >15seconds indicating improved balance and hip strength/stability PT Long Term Goal 1 - Progress: Met PT Long Term Goal 2: Increase Lt knee extension strength to 5/5 so patient and descend stairs with reciprocal gait pattern PT Long Term Goal 2 - Progress: Met Long Term Goal 3: Knee pain <2/10 with descending stairs, walking, and standing >4 hours Long Term Goal 3 Progress: Met  Problem List Patient Active Problem List   Diagnosis Date Noted  . Pain in joint, lower leg 02/23/2014  . Stiffness of joint, not elsewhere classified, lower leg 02/23/2014  . Medial meniscus, posterior horn derangement 02/02/2014  . Depression 01/16/2014  . DM (diabetes mellitus), type 2, uncontrolled 06/25/2013  . Type 2 diabetes mellitus 06/23/2013  . Routine general medical examination at a health care facility 06/22/2013  . Cerumen impaction 06/22/2013  . Left knee pain 03/23/2013  . Patellar tendonitis 03/22/2013  . Patellar tendinitis of right knee 06/16/2012  . Knee pain, acute 05/10/2012  . Hypertension 04/30/2011  . OBESITY 02/10/2008  PT - End of Session Activity Tolerance: Patient tolerated treatment well General Behavior During Therapy: WFL for tasks assessed/performed PT Plan of Care Consulted and Agree with Plan of Care: Patient  GP    Teena Irani, PTA/CLT 03/16/2014, 11:02 AM  Devona Konig PT DPT

## 2014-03-16 NOTE — Progress Notes (Signed)
Physical Therapy Re-evaluation / discharge  Patient Details  Name: Kelli Spencer MRN: [id number] Date of Birth: 10/12/1966  Today's Date: 03/16/2014 Time: 1020-1057 PT Time Calculation (min): 37 min              Visit#: 7 of 9  Re-eval: 03/25/14 Assessment Diagnosis: Lt knee meniscua repair (arthroscopic) Surgical Date: 02/09/14 Next MD Visit: Dr. Kenton Kingfisher, 02/28/14 Authorization: BCBS    Authorization Visit#: 7 of 9  Charges:  MMT/ROM testing, self care 15'   Subjective Symptoms/Limitations Symptoms: Pt states MD released her .  Pt states she feels she can be done with therapy as well.  Currently without pain.  Pain Assessment Currently in Pain?: No/denies  Sensation/Coordination/Flexibility/Functional Tests Functional Tests Functional Tests: FOTO 72% (was 46% limited Mobility)  Assessment LLE AROM (degrees) Left Knee Extension: 0 (was lacking 10 degrees from neutral) Left Knee Flexion: 115 (was 90 degrees; Rt knee is also 115 degrees) Left Ankle Dorsiflexion: 18 (was 10 degrees) LLE Strength Left Hip Flexion: 5/5 (was 4/5) Left Hip Extension: 5/5 (was 2+/5) Left Hip ABduction: 5/5 (was 3+/5) Left Knee Flexion: 5/5 (was 4+/5) Left Knee Extension: 5/5 (was 2+/5) Left Ankle Dorsiflexion: 5/5 (was 4/5)  Exercise/Treatments Aerobic Stationary Bike: 8' seat 7 for ROM    Physical Therapy Assessment and Plan PT Assessment and Plan Clinical Impression Statement: Pt has completed 7 visits following Lt knee scope.  Pt has progressed well.  Currently with 5/5 Lt LE strength and AROM for Lt knee is WNL.  Pt has met all goals.  Instructed patient to continue HEP and walking.  Pt verbalized understanding.  Pt is ready for discharge to HEP. PT Plan: Recommend discharge to HEP.    Goals PT Short Term Goals PT Short Term Goal 1: Increase Lt knee AROM to >120 to so patient can perform stand to sit without weight shifting to Rt LE due to knee stiffness PT Short Term Goal 1 -  Progress: Met (Pt's normal knee AROM is 115 degrees bilaterally) PT Short Term Goal 2: Increase Lt knee extension strength to 4/5 so patient and ascend stairs with reciprocal gait pattern PT Short Term Goal 2 - Progress: Met PT Short Term Goal 3: Increase bilateral hip extension to >5 degrees to improve stride length and gait mechaics.  PT Short Term Goal 3 - Progress: Met PT Long Term Goals PT Long Term Goal 1: Patient will be able to single leg stand on Lt LE for >15seconds indicating improved balance and hip strength/stability PT Long Term Goal 1 - Progress: Met PT Long Term Goal 2: Increase Lt knee extension strength to 5/5 so patient and descend stairs with reciprocal gait pattern PT Long Term Goal 2 - Progress: Met Long Term Goal 3: Knee pain <2/10 with descending stairs, walking, and standing >4 hours Long Term Goal 3 Progress: Met  Problem List Patient Active Problem List   Diagnosis Date Noted  . Pain in joint, lower leg 02/23/2014  . Stiffness of joint, not elsewhere classified, lower leg 02/23/2014  . Medial meniscus, posterior horn derangement 02/02/2014  . Depression 01/16/2014  . DM (diabetes mellitus), type 2, uncontrolled 06/25/2013  . Type 2 diabetes mellitus 06/23/2013  . Routine general medical examination at a health care facility 06/22/2013  . Cerumen impaction 06/22/2013  . Left knee pain 03/23/2013  . Patellar tendonitis 03/22/2013  . Patellar tendinitis of right knee 06/16/2012  . Knee pain, acute 05/10/2012  . Hypertension 04/30/2011  . OBESITY 02/10/2008  PT - End of Session Activity Tolerance: Patient tolerated treatment well General Behavior During Therapy: WFL for tasks assessed/performed PT Plan of Care Consulted and Agree with Plan of Care: Patient  GP    Teena Irani, PTA/CLT 03/16/2014, 11:02 AM

## 2014-03-17 ENCOUNTER — Ambulatory Visit (HOSPITAL_COMMUNITY): Payer: BC Managed Care – PPO | Admitting: Physical Therapy

## 2014-03-20 ENCOUNTER — Ambulatory Visit (HOSPITAL_COMMUNITY): Payer: BC Managed Care – PPO

## 2014-03-21 ENCOUNTER — Inpatient Hospital Stay (HOSPITAL_COMMUNITY): Admission: RE | Admit: 2014-03-21 | Payer: BC Managed Care – PPO | Source: Ambulatory Visit

## 2014-03-22 ENCOUNTER — Ambulatory Visit (HOSPITAL_COMMUNITY): Payer: BC Managed Care – PPO

## 2014-03-23 ENCOUNTER — Ambulatory Visit (HOSPITAL_COMMUNITY): Payer: BC Managed Care – PPO | Admitting: Physical Therapy

## 2014-03-24 ENCOUNTER — Ambulatory Visit (HOSPITAL_COMMUNITY): Payer: BC Managed Care – PPO | Admitting: Physical Therapy

## 2014-05-03 ENCOUNTER — Telehealth: Payer: Self-pay | Admitting: Family Medicine

## 2014-05-03 DIAGNOSIS — Z79899 Other long term (current) drug therapy: Secondary | ICD-10-CM

## 2014-05-03 DIAGNOSIS — IMO0002 Reserved for concepts with insufficient information to code with codable children: Secondary | ICD-10-CM

## 2014-05-03 DIAGNOSIS — I10 Essential (primary) hypertension: Secondary | ICD-10-CM

## 2014-05-03 DIAGNOSIS — E1165 Type 2 diabetes mellitus with hyperglycemia: Secondary | ICD-10-CM

## 2014-05-03 NOTE — Telephone Encounter (Signed)
pls order HBA1C, fasting lipid, microalb and chem 7 and EGFR

## 2014-05-03 NOTE — Addendum Note (Signed)
Addended by: Eual Fines on: 05/03/2014 03:57 PM   Modules accepted: Orders

## 2014-05-03 NOTE — Telephone Encounter (Signed)
Lipid needed. What other labs needed before I submit?

## 2014-05-03 NOTE — Telephone Encounter (Signed)
Labs ordered and mailed patient a letter

## 2014-05-25 ENCOUNTER — Telehealth: Payer: Self-pay | Admitting: Family Medicine

## 2014-05-25 NOTE — Telephone Encounter (Signed)
Already sent a letter for patient to have labs done and schedule a visit. This was sent last week

## 2014-06-05 ENCOUNTER — Other Ambulatory Visit: Payer: Self-pay | Admitting: Family Medicine

## 2014-06-12 ENCOUNTER — Encounter: Payer: Self-pay | Admitting: Family Medicine

## 2014-06-12 ENCOUNTER — Ambulatory Visit (INDEPENDENT_AMBULATORY_CARE_PROVIDER_SITE_OTHER): Payer: BC Managed Care – PPO | Admitting: Family Medicine

## 2014-06-12 VITALS — BP 120/80 | HR 81 | Resp 16 | Ht 60.25 in | Wt 187.4 lb

## 2014-06-12 DIAGNOSIS — F3289 Other specified depressive episodes: Secondary | ICD-10-CM

## 2014-06-12 DIAGNOSIS — M25569 Pain in unspecified knee: Secondary | ICD-10-CM

## 2014-06-12 DIAGNOSIS — M25562 Pain in left knee: Secondary | ICD-10-CM

## 2014-06-12 DIAGNOSIS — I10 Essential (primary) hypertension: Secondary | ICD-10-CM

## 2014-06-12 DIAGNOSIS — E669 Obesity, unspecified: Secondary | ICD-10-CM

## 2014-06-12 DIAGNOSIS — Z79899 Other long term (current) drug therapy: Secondary | ICD-10-CM

## 2014-06-12 DIAGNOSIS — F32A Depression, unspecified: Secondary | ICD-10-CM

## 2014-06-12 DIAGNOSIS — F329 Major depressive disorder, single episode, unspecified: Secondary | ICD-10-CM

## 2014-06-12 DIAGNOSIS — E119 Type 2 diabetes mellitus without complications: Secondary | ICD-10-CM

## 2014-06-12 LAB — MICROALBUMIN / CREATININE URINE RATIO
Creatinine, Urine: 231 mg/dL
Microalb Creat Ratio: 3.2 mg/g (ref 0.0–30.0)
Microalb, Ur: 0.73 mg/dL (ref 0.00–1.89)

## 2014-06-12 MED ORDER — BENAZEPRIL HCL 5 MG PO TABS: 5.0000 mg | ORAL_TABLET | Freq: Every day | ORAL | Status: DC

## 2014-06-12 MED ORDER — FLUOXETINE HCL 10 MG PO TABS: 10.0000 mg | ORAL_TABLET | Freq: Every day | ORAL | Status: DC

## 2014-06-12 NOTE — Progress Notes (Signed)
Subjective:    Patient ID: Kelli Spencer, female    DOB: Mar 18, 1966, 48 y.o.   MRN: [id number]  HPI The PT is here for follow up and re-evaluation of chronic medical conditions, medication management and review of any available recent lab and radiology data.  Preventive health is updated, specifically  Cancer screening and Immunization.   She unfortunately lost her Mom since last year, still waiting on disability, and has no income Continues to have severe left knee pain with buckling and instability, needs to return to orthopedic Doc who recently operated on her The PT denies any adverse reactions to current medications since the last visit. Took fluoxetine , it helped , but was afraid of becoming dependent, I assured her this is not an issue , she states will resume daily, espescially sicnce under increased stress currently and mourning loss of her Mom who had been ailing for some time     Review of Systems See HPI Denies recent fever or chills. Denies sinus pressure, nasal congestion, ear pain or sore throat. Denies chest congestion, productive cough or wheezing. Denies chest pains, palpitations and leg swelling Denies abdominal pain, nausea, vomiting,diarrhea or constipation.   Denies dysuria, frequency, hesitancy or incontinence.  Denies headaches, seizures, numbness, or tingling. Denies uncontrolled depression, anxiety does have some  insomnia. Denies skin break down or rash.        Objective:   Physical Exam  BP 120/80  Pulse 81  Resp 16  Ht 5' 0.25" (1.53 m)  Wt 187 lb 6.4 oz (85.004 kg)  BMI 36.31 kg/m2  SpO2 99% Patient alert and oriented and in no cardiopulmonary distress.  HEENT: No facial asymmetry, EOMI,   oropharynx pink and moist.  Neck supple no JVD, no mass.  Chest: Clear to auscultation bilaterally.  CVS: S1, S2 no murmurs, no S3.Regular rate.  ABD: Soft non tender.   Ext: No edema  MS: Adequate ROM spine, shoulders, hips and reduced in   Knee rigth more than  left.  Skin: Intact, no ulcerations or rash noted.  Psych: Good eye contact, normal affect. Memory intact not anxious or depressed appearing.  CNS: CN 2-12 intact, power,  normal throughout.no focal deficits noted.       Assessment & Plan:  DM (diabetes mellitus), type 2, uncontrolled Updated lab needed at/ before next visit. Patient advised to reduce carb and sweets, commit to regular physical activity, take meds as prescribed, test blood as directed, and attempt to lose weight, to improve blood sugar control.   Left knee pain ongoing left knee pain and instability, refer to ortho. Pt unable top work trying= to get disability  Hypertension Controlled, no change in medication DASH diet and commitment to daily physical activity for a minimum of 30 minutes discussed and encouraged, as a part of hypertension management. The importance of attaining a healthy weight is also discussed.   Type 2 diabetes mellitus Controlled, no change in medication Patient advised to reduce carb and sweets, commit to regular physical activity, take meds as prescribed, test blood as directed, and attempt to lose weight, to improve blood sugar control. Add ACE for renal protection and statin to lower Cv  risk  Depression Untreated, pt has been taking fluoxetine intermittently, afraid of "becoming addicted"  Did feel better when sh e was taking consistently will start  Now , esp as she recently lost her Mom, who was her support Not suicidal or homicidal  OBESITY Deteriorated. Patient re-educated about  the importance  of commitment to a  minimum of 150 minutes of exercise per week. The importance of healthy food choices with portion control discussed. Encouraged to start a food diary, count calories and to consider  joining a support group. Sample diet sheets offered. Goals set by the patient for the next several months.

## 2014-06-12 NOTE — Patient Instructions (Signed)
F/u in 4 month, call if you need me before  Sorry to hear of your Mom's passing and ongoing problems with left knee and instability  New for kidney protection is benazepril onre daily ($RemoveBef'5mg'[id number]$  ) Take tylenol pm one at night for arthritic pain and to help with sleep   Resume daily fluoxetine, this will help you   Labs today, lipid, cmp and EGFr, hBa!C, microalb from office  Check on coverage for mammogram, you need this  Reconsider vaccines, they are recommended for your protection  Foot exam today is good  You are referred to Dr Aline Brochure

## 2014-06-12 NOTE — Assessment & Plan Note (Signed)
ongoing left knee pain and instability, refer to ortho. Pt unable top work trying= to get disability

## 2014-06-12 NOTE — Assessment & Plan Note (Signed)
Updated lab needed at/ before next visit. Patient advised to reduce carb and sweets, commit to regular physical activity, take meds as prescribed, test blood as directed, and attempt to lose weight, to improve blood sugar control.  

## 2014-06-14 LAB — HEMOGLOBIN A1C
Hgb A1c MFr Bld: 6.6 % — ABNORMAL HIGH (ref <5.7)
MEAN PLASMA GLUCOSE: 143 mg/dL — AB (ref <117)

## 2014-06-14 LAB — COMPLETE METABOLIC PANEL WITH GFR
ALBUMIN: 4.2 g/dL (ref 3.5–5.2)
ALT: 11 U/L (ref 0–35)
AST: 12 U/L (ref 0–37)
Alkaline Phosphatase: 97 U/L (ref 39–117)
BUN: 23 mg/dL (ref 6–23)
CALCIUM: 9.9 mg/dL (ref 8.4–10.5)
CHLORIDE: 99 meq/L (ref 96–112)
CO2: 27 mEq/L (ref 19–32)
CREATININE: 1.26 mg/dL — AB (ref 0.50–1.10)
GFR, Est African American: 58 mL/min — ABNORMAL LOW
GFR, Est Non African American: 50 mL/min — ABNORMAL LOW
Glucose, Bld: 103 mg/dL — ABNORMAL HIGH (ref 70–99)
POTASSIUM: 4.7 meq/L (ref 3.5–5.3)
Sodium: 138 mEq/L (ref 135–145)
Total Bilirubin: 0.4 mg/dL (ref 0.2–1.2)
Total Protein: 7.4 g/dL (ref 6.0–8.3)

## 2014-06-14 LAB — LIPID PANEL
CHOL/HDL RATIO: 3.5 ratio
Cholesterol: 156 mg/dL (ref 0–200)
HDL: 45 mg/dL (ref >39)
LDL CALC: 98 mg/dL (ref 0–99)
Triglycerides: 63 mg/dL (ref <150)
VLDL: 13 mg/dL (ref 0–40)

## 2014-06-14 NOTE — Assessment & Plan Note (Signed)
Controlled, no change in medication Patient advised to reduce carb and sweets, commit to regular physical activity, take meds as prescribed, test blood as directed, and attempt to lose weight, to improve blood sugar control. Add ACE for renal protection and statin to lower Cv  risk

## 2014-06-14 NOTE — Assessment & Plan Note (Signed)
Deteriorated. Patient re-educated about  the importance of commitment to a  minimum of 150 minutes of exercise per week. The importance of healthy food choices with portion control discussed. Encouraged to start a food diary, count calories and to consider  joining a support group. Sample diet sheets offered. Goals set by the patient for the next several months.    

## 2014-06-14 NOTE — Assessment & Plan Note (Signed)
Controlled, no change in medication DASH diet and commitment to daily physical activity for a minimum of 30 minutes discussed and encouraged, as a part of hypertension management. The importance of attaining a healthy weight is also discussed.  

## 2014-06-14 NOTE — Assessment & Plan Note (Signed)
Untreated, pt has been taking fluoxetine intermittently, afraid of "becoming addicted"  Did feel better when sh e was taking consistently will start  Now , esp as she recently lost her Mom, who was her support Not suicidal or homicidal

## 2014-06-16 ENCOUNTER — Other Ambulatory Visit: Payer: Self-pay

## 2014-06-16 DIAGNOSIS — E119 Type 2 diabetes mellitus without complications: Secondary | ICD-10-CM

## 2014-06-16 MED ORDER — LOVASTATIN 10 MG PO TABS: 10.0000 mg | ORAL_TABLET | Freq: Every day | ORAL | Status: DC

## 2014-07-06 ENCOUNTER — Ambulatory Visit: Payer: BC Managed Care – PPO | Admitting: Family Medicine

## 2014-08-07 ENCOUNTER — Ambulatory Visit (INDEPENDENT_AMBULATORY_CARE_PROVIDER_SITE_OTHER): Payer: BC Managed Care – PPO | Admitting: Orthopedic Surgery

## 2014-08-07 ENCOUNTER — Encounter: Payer: Self-pay | Admitting: Orthopedic Surgery

## 2014-08-07 VITALS — BP 126/86 | Ht 62.5 in | Wt 187.0 lb

## 2014-08-07 DIAGNOSIS — M7650 Patellar tendinitis, unspecified knee: Secondary | ICD-10-CM

## 2014-08-07 MED ORDER — NABUMETONE 750 MG PO TABS: 750.0000 mg | ORAL_TABLET | Freq: Two times a day (BID) | ORAL | Status: DC

## 2014-08-07 NOTE — Patient Instructions (Signed)
Start  Meds ordered this encounter  Medications  . nabumetone (RELAFEN) 750 MG tablet    Sig: Take 1 tablet (750 mg total) by mouth 2 (two) times daily.    Dispense:  60 tablet    Refill:  1    Ice the knees 2 x a day

## 2014-08-07 NOTE — Progress Notes (Signed)
Established patient recurrent problem  No chief complaint on file.   BP 126/86  Ht 5' 2.5" (1.588 m)  Wt 187 lb (84.823 kg)  BMI 33.64 kg/m2  PRE-OPERATIVE DIAGNOSIS:  Left knee medial meniscal tear  POST-OPERATIVE DIAGNOSIS:  Left knee medial meniscal tear  PROCEDURE:  Procedure(s): KNEE ARTHROSCOPY WITH MEDIAL MENISECTOMY (Left)  She also has a chronic history of patellar tendinitis  Presents with bilateral anterior knee pain over the patellar tendons. MRI on 02/07/2014 showed patellar tendinosis and chronic inflammatory changes of Hoffa's fat pad  She denies any recent trauma  Review of systems anxiety and depression denies numbness or tingling  Vital signs are stable she is walking without support devices. Her overall appearance is normal. She is oriented x3 her gait and station aren't supported  She has bilateral tenderness over the patellar tendons with full range of motion both knees are stable strength is normal including extensor mechanism. Skin is intact bilaterally is normal distal pulses in each foot in each leg she has normal sensation bilaterally  She has bilateral patellar tendinitis  She is allergic to prednisone  Recommend Relafen.

## 2014-08-20 ENCOUNTER — Telehealth: Payer: Self-pay | Admitting: Orthopedic Surgery

## 2014-08-21 NOTE — Telephone Encounter (Signed)
Called Charlene, no answer, left VM

## 2014-08-21 NOTE — Telephone Encounter (Signed)
i think she can sit

## 2014-08-21 NOTE — Telephone Encounter (Signed)
Routing to Dr Harrison 

## 2014-08-22 NOTE — Telephone Encounter (Signed)
Charlene aware

## 2014-09-05 DIAGNOSIS — E1159 Type 2 diabetes mellitus with other circulatory complications: Secondary | ICD-10-CM | POA: Insufficient documentation

## 2014-09-05 DIAGNOSIS — E785 Hyperlipidemia, unspecified: Secondary | ICD-10-CM | POA: Insufficient documentation

## 2014-09-05 DIAGNOSIS — E669 Obesity, unspecified: Secondary | ICD-10-CM

## 2014-09-05 DIAGNOSIS — E1169 Type 2 diabetes mellitus with other specified complication: Secondary | ICD-10-CM

## 2014-09-05 NOTE — Assessment & Plan Note (Signed)
Controlled, no change in medication DASH diet and commitment to daily physical activity for a minimum of 30 minutes discussed and encouraged, as a part of hypertension management. The importance of attaining a healthy weight is also discussed.  

## 2014-09-05 NOTE — Assessment & Plan Note (Signed)
Improved. Pt applauded on succesful weight loss through lifestyle change, and encouraged to continue same. Weight loss goal set for the next several months.  

## 2014-09-05 NOTE — Assessment & Plan Note (Signed)
Controlled, no change in medication Patient advised to reduce carb and sweets, commit to regular physical activity, take meds as prescribed, test blood as directed, and attempt to lose weight, to improve blood sugar control.  

## 2014-09-05 NOTE — Assessment & Plan Note (Signed)
Improved and controlled. Pt applauded on this Patient advised to reduce carb and sweets, commit to regular physical activity, take meds as prescribed, test blood as directed, and attempt to lose weight, to improve blood sugar control. Updated lab needed at/ before next visit.

## 2014-09-05 NOTE — Assessment & Plan Note (Signed)
Start anti depressant therapy base don today's exam Pt not suicidal or homicial, life stresses are the overwhelming factor

## 2014-09-19 ENCOUNTER — Ambulatory Visit (INDEPENDENT_AMBULATORY_CARE_PROVIDER_SITE_OTHER): Payer: BC Managed Care – PPO | Admitting: Orthopedic Surgery

## 2014-09-19 VITALS — BP 116/52 | Ht 62.5 in | Wt 187.0 lb

## 2014-09-19 DIAGNOSIS — M7650 Patellar tendinitis, unspecified knee: Secondary | ICD-10-CM

## 2014-09-19 NOTE — Progress Notes (Signed)
Patient ID: Kelli Spencer, female   DOB: June 27, 1966, 48 y.o.   MRN: [id number] Chief Complaint  Patient presents with  . Follow-up    6 week recheck bilateral knees    The patient is being treated for patellar tendinitis in both knees with Relafen and she is doing well without although she has some good days and some bad days  Point tenderness at the patellar tendon insertion is noted.  Continue Relafen follow-up 6 weeks

## 2014-09-20 ENCOUNTER — Other Ambulatory Visit: Payer: Self-pay | Admitting: Family Medicine

## 2014-10-16 ENCOUNTER — Ambulatory Visit (INDEPENDENT_AMBULATORY_CARE_PROVIDER_SITE_OTHER): Payer: BC Managed Care – PPO | Admitting: Family Medicine

## 2014-10-16 ENCOUNTER — Encounter: Payer: Self-pay | Admitting: Family Medicine

## 2014-10-16 VITALS — BP 114/78 | HR 82 | Resp 16 | Ht 63.0 in | Wt 186.0 lb

## 2014-10-16 DIAGNOSIS — E669 Obesity, unspecified: Secondary | ICD-10-CM

## 2014-10-16 DIAGNOSIS — L299 Pruritus, unspecified: Secondary | ICD-10-CM | POA: Insufficient documentation

## 2014-10-16 DIAGNOSIS — IMO0002 Reserved for concepts with insufficient information to code with codable children: Secondary | ICD-10-CM

## 2014-10-16 DIAGNOSIS — E1169 Type 2 diabetes mellitus with other specified complication: Secondary | ICD-10-CM

## 2014-10-16 DIAGNOSIS — E119 Type 2 diabetes mellitus without complications: Secondary | ICD-10-CM

## 2014-10-16 DIAGNOSIS — F32A Depression, unspecified: Secondary | ICD-10-CM

## 2014-10-16 DIAGNOSIS — I10 Essential (primary) hypertension: Secondary | ICD-10-CM

## 2014-10-16 DIAGNOSIS — Z1231 Encounter for screening mammogram for malignant neoplasm of breast: Secondary | ICD-10-CM

## 2014-10-16 DIAGNOSIS — Z1211 Encounter for screening for malignant neoplasm of colon: Secondary | ICD-10-CM

## 2014-10-16 DIAGNOSIS — E1165 Type 2 diabetes mellitus with hyperglycemia: Secondary | ICD-10-CM

## 2014-10-16 DIAGNOSIS — M7650 Patellar tendinitis, unspecified knee: Secondary | ICD-10-CM

## 2014-10-16 DIAGNOSIS — Z13220 Encounter for screening for lipoid disorders: Secondary | ICD-10-CM

## 2014-10-16 DIAGNOSIS — F329 Major depressive disorder, single episode, unspecified: Secondary | ICD-10-CM

## 2014-10-16 DIAGNOSIS — Z2821 Immunization not carried out because of patient refusal: Secondary | ICD-10-CM

## 2014-10-16 LAB — POC HEMOCCULT BLD/STL (OFFICE/1-CARD/DIAGNOSTIC): Fecal Occult Blood, POC: NEGATIVE

## 2014-10-16 MED ORDER — HYDROXYZINE HCL 50 MG PO TABS: ORAL_TABLET | ORAL | Status: DC

## 2014-10-16 NOTE — Assessment & Plan Note (Signed)
Ongoing disabling bilateral knee pai n treated by orhtlo, trying to get disability due to knee problems

## 2014-10-16 NOTE — Assessment & Plan Note (Signed)
Improved with medcation which she is tolerating well, no change, also continue therapy

## 2014-10-16 NOTE — Assessment & Plan Note (Signed)
No mass , heme negative stoiol, still needs colonoscopy, and states she will go, referral done

## 2014-10-16 NOTE — Patient Instructions (Signed)
Annual physical exam in 5.5 month, call if you need me before  Fasting lipid, cmp and EGFr and HBA1C 3rd week in January  Rectal exam today is normal  You are referred for a mammogram and colonoscopy and diabetic eye exam  Pleease reconsider vaccines, they are for your protection  New for itch which is likely due to your nerves, is hydroxyzine at bedtime

## 2014-10-16 NOTE — Progress Notes (Signed)
Subjective:    Patient ID: Kelli Spencer, female    DOB: Jul 21, 1966, 48 y.o.   MRN: [id number]  HPI The PT is here for follow up and re-evaluation of chronic medical conditions, medication management and review of any available recent lab and radiology data.  Preventive health is updated, specifically  Cancer screening and Immunization.refuses immunization states will get cancer screening done, financially stressed   Questions or concerns regarding consultations or procedures which the PT has had in the interim are  Addressed.Seeing ortho re knee pain The PT denies any adverse reactions to current medications since the last visit.  Denies polyuria, polydipsia, blurred vision , or hypoglycemic episodes.       Review of Systems See HPI Denies recent fever or chills. Denies sinus pressure, nasal congestion, ear pain or sore throat. Denies chest congestion, productive cough or wheezing. Denies chest pains, palpitations and leg swelling Denies abdominal pain, nausea, vomiting,diarrhea or constipation.   Denies dysuria, frequency, hesitancy or incontinence. Ongoing pain and debility of knees and back Denies headaches, seizures, numbness, or tingling. Denies uncontrolled  depression, anxiety or insomnia.Improved , but still not totally over grief of losing Mom in July this year, and still has no income Denies skin break down or rash.        Objective:   Physical Exam  BP 114/78 mmHg  Pulse 82  Resp 16  Ht 5\' 3"  (1.6 m)  Wt 186 lb (84.369 kg)  BMI 32.96 kg/m2  SpO2 96% Patient alert and oriented and in no cardiopulmonary distress.  HEENT: No facial asymmetry, EOMI,   oropharynx pink and moist.  Neck supple no JVD, no mass.  Chest: Clear to auscultation bilaterally.  CVS: S1, S2 no murmurs, no S3.Regular rate.  ABD: Soft non tender. No organomegaly or mass, normal Bs Rectal; no mass, heme negative stool  Ext: No edema  MS: decreased  ROM spine, and knees.  Skin:  Intact, no ulcerations or rash noted.  Psych: Good eye contact, normal affect. Memory intact not anxious or depressed appearing.  CNS: CN 2-12 intact, power,  normal throughout.no focal deficits noted.       Assessment & Plan:  Essential hypertension Controlled, no change in medication DASH diet and commitment to daily physical activity for a minimum of 30 minutes discussed and encouraged, as a part of hypertension management. The importance of attaining a healthy weight is also discussed.   Diabetes mellitus type 2 in obese Updated lab needed at/ before next visit. Patient advised to reduce carb and sweets, commit to regular physical activity, take meds as prescribed, test blood as directed, and attempt to lose weight, to improve blood sugar control.   Pruritus No /o rash, new toiletries or cosmetics Incrased anxiety, recent loss of her mm and still not getting disability / income Trial of hydroxyzine at bedtime, currently getting therapy which she finds beneficial and needs to conrtinue  Depression Improved with medcation which she is tolerating well, no change, also continue therapy  Patellar tendonitis Ongoing disabling bilateral knee pai n treated by orhtlo, trying to get disability due to knee problems  Obesity (BMI 30.0-34.9) Unchanged Patient re-educated about  the importance of commitment to a  minimum of 150 minutes of exercise per week. The importance of healthy food choices with portion control discussed. Encouraged to start a food diary, count calories and to consider  joining a support group. Sample diet sheets offered. Goals set by the patient for the next several months.  Immunization refused Continues to refuse all immunization needed, sites phobia as far as "needles are concerned" Encouraged to [place] consideration as benefit outweighs risk  Special screening for malignant neoplasms, colon No mass , heme negative stoiol, still needs  colonoscopy, and states she will go, referral done

## 2014-10-16 NOTE — Assessment & Plan Note (Signed)
Unchanged. Patient re-educated about  the importance of commitment to a  minimum of 150 minutes of exercise per week. The importance of healthy food choices with portion control discussed. Encouraged to start a food diary, count calories and to consider  joining a support group. Sample diet sheets offered. Goals set by the patient for the next several months.    

## 2014-10-16 NOTE — Assessment & Plan Note (Signed)
Continues to refuse all immunization needed, sites phobia as far as "needles are concerned" Encouraged to [place] consideration as benefit outweighs risk

## 2014-10-16 NOTE — Assessment & Plan Note (Signed)
No /o rash, new toiletries or cosmetics Incrased anxiety, recent loss of her mm and still not getting disability / income Trial of hydroxyzine at bedtime, currently getting therapy which she finds beneficial and needs to conrtinue

## 2014-10-16 NOTE — Assessment & Plan Note (Signed)
Updated lab needed at/ before next visit. Patient advised to reduce carb and sweets, commit to regular physical activity, take meds as prescribed, test blood as directed, and attempt to lose weight, to improve blood sugar control.  

## 2014-10-16 NOTE — Assessment & Plan Note (Signed)
Controlled, no change in medication DASH diet and commitment to daily physical activity for a minimum of 30 minutes discussed and encouraged, as a part of hypertension management. The importance of attaining a healthy weight is also discussed.  

## 2014-10-18 ENCOUNTER — Other Ambulatory Visit: Payer: Self-pay

## 2014-10-18 MED ORDER — LANSOPRAZOLE 30 MG PO CPDR: 30.0000 mg | DELAYED_RELEASE_CAPSULE | Freq: Every day | ORAL | Status: DC

## 2014-10-31 ENCOUNTER — Ambulatory Visit (INDEPENDENT_AMBULATORY_CARE_PROVIDER_SITE_OTHER): Payer: BC Managed Care – PPO | Admitting: Orthopedic Surgery

## 2014-10-31 ENCOUNTER — Other Ambulatory Visit: Payer: Self-pay | Admitting: *Deleted

## 2014-10-31 VITALS — BP 106/70 | Ht 63.0 in | Wt 186.0 lb

## 2014-10-31 DIAGNOSIS — M7650 Patellar tendinitis, unspecified knee: Secondary | ICD-10-CM

## 2014-10-31 MED ORDER — ACETAMINOPHEN-CODEINE #3 300-30 MG PO TABS
1.0000 | ORAL_TABLET | ORAL | Status: DC | PRN
Start: 2014-10-31 — End: 2016-05-01

## 2014-10-31 NOTE — Progress Notes (Signed)
Follow up  Chief Complaint  Patient presents with  . Follow-up    6 week recheck bilateral knees   BP 106/70 mmHg  Ht 5\' 3"  (1.6 m)  Wt 186 lb (84.369 kg)  BMI 32.96 kg/m2  Swelling and pain legs and knees  Tibial pain at rest and at night severe   ROS: normal today   Bilateral knee examination reveals no joint effusion freely and easy range of motion but tenderness at the patellar tendon no tenderness in the tibial area with knees are stable strength is normal skin is intact with discoloration which is chronic  Patella tendinitis continue nabumetone 750 twice a day add Tylenol 3 for pain  Patient will follow-up on an as-needed basis no surgical intervention needed at this point.

## 2014-11-08 ENCOUNTER — Ambulatory Visit (HOSPITAL_COMMUNITY)
Admission: RE | Admit: 2014-11-08 | Discharge: 2014-11-08 | Disposition: A | Discharge status: Home / Self Care | Payer: BLUE CROSS/BLUE SHIELD | Source: Ambulatory Visit | Attending: Family Medicine | Admitting: Family Medicine

## 2014-11-08 DIAGNOSIS — Z1231 Encounter for screening mammogram for malignant neoplasm of breast: Secondary | ICD-10-CM

## 2014-12-06 LAB — LIPID PANEL
CHOL/HDL RATIO: 3.9 ratio
Cholesterol: 184 mg/dL (ref 0–200)
HDL: 47 mg/dL (ref >39)
LDL Cholesterol: 125 mg/dL — ABNORMAL HIGH (ref 0–99)
Triglycerides: 62 mg/dL (ref <150)
VLDL: 12 mg/dL (ref 0–40)

## 2014-12-06 LAB — COMPLETE METABOLIC PANEL WITH GFR
ALBUMIN: 4.4 g/dL (ref 3.5–5.2)
ALT: 11 U/L (ref 0–35)
AST: 11 U/L (ref 0–37)
Alkaline Phosphatase: 122 U/L — ABNORMAL HIGH (ref 39–117)
BUN: 22 mg/dL (ref 6–23)
CALCIUM: 10.6 mg/dL — AB (ref 8.4–10.5)
CO2: 30 mEq/L (ref 19–32)
Chloride: 98 mEq/L (ref 96–112)
Creat: 1.29 mg/dL — ABNORMAL HIGH (ref 0.50–1.10)
GFR, Est African American: 56 mL/min — ABNORMAL LOW
GFR, Est Non African American: 48 mL/min — ABNORMAL LOW
Glucose, Bld: 104 mg/dL — ABNORMAL HIGH (ref 70–99)
POTASSIUM: 4.5 meq/L (ref 3.5–5.3)
Sodium: 138 mEq/L (ref 135–145)
Total Bilirubin: 0.4 mg/dL (ref 0.2–1.2)
Total Protein: 8.2 g/dL (ref 6.0–8.3)

## 2014-12-06 LAB — HEMOGLOBIN A1C
Hgb A1c MFr Bld: 7.1 % — ABNORMAL HIGH (ref <5.7)
Mean Plasma Glucose: 157 mg/dL — ABNORMAL HIGH (ref <117)

## 2014-12-26 ENCOUNTER — Telehealth: Payer: Self-pay | Admitting: *Deleted

## 2014-12-26 NOTE — Telephone Encounter (Signed)
Pt LMOM to speak with a nurse about another RX the one she is on now keeps her up. Please advise

## 2014-12-27 NOTE — Telephone Encounter (Signed)
Attempted to reach patient.  No ability to leave message.

## 2014-12-28 NOTE — Addendum Note (Signed)
Addended by: Eual Fines on: 12/28/2014 11:10 AM   Modules accepted: Orders

## 2015-02-09 ENCOUNTER — Other Ambulatory Visit: Payer: Self-pay | Admitting: Family Medicine

## 2015-03-15 ENCOUNTER — Other Ambulatory Visit: Payer: Self-pay | Admitting: Family Medicine

## 2015-04-17 ENCOUNTER — Encounter: Payer: Self-pay | Admitting: *Deleted

## 2015-04-17 ENCOUNTER — Encounter: Payer: BC Managed Care – PPO | Admitting: Family Medicine

## 2015-08-15 ENCOUNTER — Ambulatory Visit: Payer: Self-pay | Admitting: Physician Assistant

## 2015-08-15 ENCOUNTER — Encounter: Payer: Self-pay | Admitting: Physician Assistant

## 2015-08-15 VITALS — BP 102/72 | HR 64 | Temp 97.2°F | Ht 59.5 in | Wt 192.4 lb

## 2015-08-15 DIAGNOSIS — IMO0001 Reserved for inherently not codable concepts without codable children: Secondary | ICD-10-CM

## 2015-08-15 DIAGNOSIS — M109 Gout, unspecified: Secondary | ICD-10-CM

## 2015-08-15 DIAGNOSIS — E1165 Type 2 diabetes mellitus with hyperglycemia: Principal | ICD-10-CM

## 2015-08-15 DIAGNOSIS — I10 Essential (primary) hypertension: Secondary | ICD-10-CM

## 2015-08-15 DIAGNOSIS — K219 Gastro-esophageal reflux disease without esophagitis: Secondary | ICD-10-CM

## 2015-08-15 MED ORDER — GLIPIZIDE 10 MG PO TABS: 10.0000 mg | ORAL_TABLET | Freq: Two times a day (BID) | ORAL | Status: DC

## 2015-08-15 MED ORDER — LISINOPRIL-HYDROCHLOROTHIAZIDE 10-12.5 MG PO TABS: 1.0000 | ORAL_TABLET | Freq: Every day | ORAL | Status: DC

## 2015-08-15 MED ORDER — OMEPRAZOLE 40 MG PO CPDR: 40.0000 mg | DELAYED_RELEASE_CAPSULE | Freq: Two times a day (BID) | ORAL | Status: DC

## 2015-08-15 NOTE — Progress Notes (Signed)
BP 102/72 mmHg  Pulse 64  Temp(Src) 97.2 F (36.2 C)  Ht 4' 11.5" (1.511 m)  Wt 192 lb 6.4 oz (87.272 kg)  BMI 38.22 kg/m2  SpO2 96%   Subjective:    Patient ID: Kelli Spencer, female    DOB: 07/08/1966, 49 y.o.   MRN: [id number]  HPI: Kelli Spencer is a 49 y.o. female presenting on 08/15/2015 for Diabetes; Hypertension; Gastrophageal Reflux; and Foot Pain   HPI   Chief Complaint  Patient presents with  . Diabetes  . Hypertension  . Gastrophageal Reflux  . Foot Pain    can hardly walk due to pain since yesterday evening    Pt says foot pain doesn't feel like her gout. Denies injury  Jerrye Bushy- she didn't get refill on protonix b/c she could not afford it. She is still having problems with it.  Pt doing well with her new meds- lisiopril/h ctz and glipized  Relevant past medical, surgical, family and social history reviewed and updated as indicated. Interim medical history since our last visit reviewed. Allergies and medications reviewed and updated.   Current outpatient prescriptions:  .  acetaminophen-codeine (TYLENOL #3) 300-30 MG per tablet, Take 1-2 tablets by mouth every 4 (four) hours as needed for moderate pain., Disp: 120 tablet, Rfl: 5 .  cholecalciferol (VITAMIN D) 400 UNITS TABS tablet, Take 800 Units by mouth daily., Disp: , Rfl:  .  FLUoxetine (PROZAC) 10 MG tablet, Take 1 tablet (10 mg total) by mouth daily., Disp: 30 tablet, Rfl: 5 .  glipiZIDE (GLUCOTROL) 10 MG tablet, Take 10 mg by mouth daily before breakfast., Disp: , Rfl:  .  hydrOXYzine (ATARAX/VISTARIL) 50 MG tablet, TAKE ONE TABLET BY MOUTH AT BEDTIME FOR ITCHING AND ANXIETY, Disp: 30 tablet, Rfl: 0 .  indomethacin (INDOCIN) 25 MG capsule, Take 25 mg by mouth 3 (three) times daily as needed (for gout)., Disp: , Rfl:  .  lisinopril-hydrochlorothiazide (PRINZIDE,ZESTORETIC) 10-12.5 MG tablet, Take 1 tablet by mouth daily., Disp: , Rfl:  .  traZODone (DESYREL) 100 MG tablet, Take 100 mg by mouth at  bedtime., Disp: , Rfl:  .  glipiZIDE (GLUCOTROL) 10 MG tablet, Take 1 tablet (10 mg total) by mouth 2 (two) times daily before a meal., Disp: 180 tablet, Rfl: 1 .  lisinopril-hydrochlorothiazide (PRINZIDE,ZESTORETIC) 10-12.5 MG tablet, Take 1 tablet by mouth daily., Disp: 90 tablet, Rfl: 3 .  omeprazole (PRILOSEC) 40 MG capsule, Take 1 capsule (40 mg total) by mouth 2 (two) times daily., Disp: 180 capsule, Rfl: 1 .  pantoprazole (PROTONIX) 40 MG tablet, Take 40 mg by mouth daily., Disp: , Rfl:    Review of Systems  Constitutional: Negative for fever, chills, diaphoresis, appetite change, fatigue and unexpected weight change.  HENT: Negative for congestion, drooling, ear pain, facial swelling, hearing loss, mouth sores, sneezing, sore throat, trouble swallowing and voice change.   Eyes: Negative for pain, discharge, redness, itching and visual disturbance.  Respiratory: Positive for shortness of breath. Negative for cough, choking and wheezing.   Cardiovascular: Negative for chest pain, palpitations and leg swelling.  Gastrointestinal: Negative for vomiting, abdominal pain, diarrhea, constipation and blood in stool.  Endocrine: Negative for cold intolerance, heat intolerance and polydipsia.  Genitourinary: Negative for dysuria, hematuria and decreased urine volume.  Musculoskeletal: Positive for joint swelling and gait problem. Negative for back pain and arthralgias.  Skin: Negative for rash.  Allergic/Immunologic: Negative for environmental allergies.  Neurological: Negative for seizures, syncope, light-headedness and headaches.  Hematological: Negative  for adenopathy.  Psychiatric/Behavioral: Positive for dysphoric mood. Negative for suicidal ideas and agitation. The patient is nervous/anxious.     Per HPI unless specifically indicated above     Objective:    BP 102/72 mmHg  Pulse 64  Temp(Src) 97.2 F (36.2 C)  Ht 4' 11.5" (1.511 m)  Wt 192 lb 6.4 oz (87.272 kg)  BMI 38.22 kg/m2   SpO2 96%  Wt Readings from Last 3 Encounters:  08/15/15 192 lb 6.4 oz (87.272 kg)  10/31/14 186 lb (84.369 kg)  10/16/14 186 lb (84.369 kg)    Physical Exam  Constitutional: She is oriented to person, place, and time. She appears well-developed and well-nourished.  HENT:  Head: Normocephalic and atraumatic.  Neck: Neck supple.  Cardiovascular: Normal rate and regular rhythm.   Pulmonary/Chest: Effort normal and breath sounds normal.  Abdominal: Soft. Bowel sounds are normal. She exhibits no mass. There is no tenderness.  Musculoskeletal: She exhibits no edema.  Lymphadenopathy:    She has no cervical adenopathy.  Neurological: She is alert and oriented to person, place, and time.  Skin: Skin is warm and dry.  Psychiatric: She has a normal mood and affect. Her behavior is normal.  Vitals reviewed.  L foot with large area soreness-  No point tenderness DM Foot exam done  Assessment & Plan:   Encounter Diagnoses  Name Primary?  Marland Kitchen Uncontrolled diabetes mellitus type 2 without complications, unspecified long term insulin use status ([place]) Yes  . Essential hypertension   . Gastroesophageal reflux disease, esophagitis presence not specified   . Gout without tophus, unspecified cause, unspecified chronicity, unspecified site      Reviewed labs with pt. Reminded her that the glipizide is to be taken bid, not qd as she was taking it.   Set pt up for medassist and order omeparzole for her reflux  Rx Indocin for the foot pain  F/u 2 mo. rto sooner prn

## 2015-08-19 DIAGNOSIS — M109 Gout, unspecified: Secondary | ICD-10-CM | POA: Insufficient documentation

## 2015-08-19 DIAGNOSIS — K219 Gastro-esophageal reflux disease without esophagitis: Secondary | ICD-10-CM | POA: Insufficient documentation

## 2015-10-15 ENCOUNTER — Ambulatory Visit: Payer: Self-pay | Admitting: Physician Assistant

## 2015-10-15 ENCOUNTER — Encounter: Payer: Self-pay | Admitting: Physician Assistant

## 2015-10-15 VITALS — BP 106/80 | HR 88 | Temp 97.3°F | Ht 60.0 in | Wt 189.5 lb

## 2015-10-15 DIAGNOSIS — Z1211 Encounter for screening for malignant neoplasm of colon: Secondary | ICD-10-CM

## 2015-10-15 DIAGNOSIS — I10 Essential (primary) hypertension: Secondary | ICD-10-CM

## 2015-10-15 DIAGNOSIS — IMO0001 Reserved for inherently not codable concepts without codable children: Secondary | ICD-10-CM

## 2015-10-15 DIAGNOSIS — K219 Gastro-esophageal reflux disease without esophagitis: Secondary | ICD-10-CM

## 2015-10-15 DIAGNOSIS — M109 Gout, unspecified: Secondary | ICD-10-CM

## 2015-10-15 DIAGNOSIS — E1165 Type 2 diabetes mellitus with hyperglycemia: Principal | ICD-10-CM

## 2015-10-15 DIAGNOSIS — Z1239 Encounter for other screening for malignant neoplasm of breast: Secondary | ICD-10-CM

## 2015-10-15 LAB — BASIC METABOLIC PANEL
BUN: 23 mg/dL (ref 7–25)
CHLORIDE: 99 mmol/L (ref 98–110)
CO2: 27 mmol/L (ref 20–31)
Calcium: 9.9 mg/dL (ref 8.6–10.4)
Creat: 1.2 mg/dL — ABNORMAL HIGH (ref 0.50–1.05)
Glucose, Bld: 133 mg/dL — ABNORMAL HIGH (ref 65–99)
POTASSIUM: 4.3 mmol/L (ref 3.5–5.3)
Sodium: 139 mmol/L (ref 135–146)

## 2015-10-15 LAB — HEMOGLOBIN A1C
Hgb A1c MFr Bld: 7.5 % — ABNORMAL HIGH (ref <5.7)
Mean Plasma Glucose: 169 mg/dL — ABNORMAL HIGH (ref <117)

## 2015-10-15 NOTE — Patient Instructions (Signed)
Get blood drawn to check labs Return stool test to office Someone will call you for mammogram appointment in January You are on list for DM eye exam Pt still need to attend DM education class

## 2015-10-15 NOTE — Progress Notes (Signed)
BP 106/80 mmHg  Pulse 88  Temp(Src) 97.3 F (36.3 C)  Ht 5' (1.524 m)  Wt 189 lb 8 oz (85.957 kg)  BMI 37.01 kg/m2  SpO2 99%   Subjective:    Patient ID: Kelli Spencer, female    DOB: 1966-03-10, 49 y.o.   MRN: [id number]  HPI: Kelli Spencer is a 49 y.o. female presenting on 10/15/2015 for Diabetes and Gastroesophageal Reflux   HPI  Chief Complaint  Patient presents with  . Diabetes  . Gastroesophageal Reflux    Pt continues with [facility] [place] services Pt says she is feeling well today  Relevant past medical, surgical, family and social history reviewed and updated as indicated. Interim medical history since our last visit reviewed. Allergies and medications reviewed and updated.  Current outpatient prescriptions:  .  acetaminophen-codeine (TYLENOL #3) 300-30 MG per tablet, Take 1-2 tablets by mouth every 4 (four) hours as needed for moderate pain., Disp: 120 tablet, Rfl: 5 .  cholecalciferol (VITAMIN D) 400 UNITS TABS tablet, Take 800 Units by mouth daily., Disp: , Rfl:  .  FLUoxetine (PROZAC) 10 MG tablet, Take 1 tablet (10 mg total) by mouth daily., Disp: 30 tablet, Rfl: 5 .  glipiZIDE (GLUCOTROL) 10 MG tablet, Take 1 tablet (10 mg total) by mouth 2 (two) times daily before a meal., Disp: 180 tablet, Rfl: 1 .  hydrOXYzine (ATARAX/VISTARIL) 50 MG tablet, TAKE ONE TABLET BY MOUTH AT BEDTIME FOR ITCHING AND ANXIETY, Disp: 30 tablet, Rfl: 0 .  indomethacin (INDOCIN) 25 MG capsule, Take 25 mg by mouth 3 (three) times daily as needed (for gout)., Disp: , Rfl:  .  lisinopril-hydrochlorothiazide (PRINZIDE,ZESTORETIC) 10-12.5 MG tablet, Take 1 tablet by mouth daily., Disp: 90 tablet, Rfl: 3 .  omeprazole (PRILOSEC) 40 MG capsule, Take 1 capsule (40 mg total) by mouth 2 (two) times daily., Disp: 180 capsule, Rfl: 1 .  traZODone (DESYREL) 100 MG tablet, Take 100 mg by mouth at bedtime., Disp: , Rfl:    Review of Systems  Constitutional: Negative for fever, chills,  diaphoresis, appetite change, fatigue and unexpected weight change.  HENT: Positive for sneezing and sore throat. Negative for congestion, dental problem, drooling, ear pain, facial swelling, hearing loss, mouth sores, trouble swallowing and voice change.   Eyes: Negative for pain, discharge, redness, itching and visual disturbance.  Respiratory: Positive for cough and shortness of breath. Negative for choking and wheezing.   Cardiovascular: Positive for leg swelling. Negative for chest pain and palpitations.  Gastrointestinal: Negative for vomiting, abdominal pain, diarrhea, constipation and blood in stool.  Endocrine: Negative for cold intolerance, heat intolerance and polydipsia.  Genitourinary: Negative for dysuria, hematuria and decreased urine volume.  Musculoskeletal: Negative for back pain, arthralgias and gait problem.  Skin: Negative for rash.  Allergic/Immunologic: Negative for environmental allergies.  Neurological: Negative for seizures, syncope, light-headedness and headaches.  Hematological: Negative for adenopathy.  Psychiatric/Behavioral: Positive for dysphoric mood. Negative for suicidal ideas and agitation. The patient is nervous/anxious.     Per HPI unless specifically indicated above     Objective:    BP 106/80 mmHg  Pulse 88  Temp(Src) 97.3 F (36.3 C)  Ht 5' (1.524 m)  Wt 189 lb 8 oz (85.957 kg)  BMI 37.01 kg/m2  SpO2 99%  Wt Readings from Last 3 Encounters:  10/15/15 189 lb 8 oz (85.957 kg)  08/15/15 192 lb 6.4 oz (87.272 kg)  10/31/14 186 lb (84.369 kg)    Physical Exam  Constitutional: She  is oriented to person, place, and time. She appears well-developed and well-nourished.  HENT:  Head: Normocephalic and atraumatic.  Neck: Neck supple.  Cardiovascular: Normal rate and regular rhythm.   Pulmonary/Chest: Effort normal and breath sounds normal.  Abdominal: Soft. Bowel sounds are normal. She exhibits no mass. There is no tenderness.  Musculoskeletal:  She exhibits no edema.  Lymphadenopathy:    She has no cervical adenopathy.  Neurological: She is alert and oriented to person, place, and time.  Skin: Skin is warm and dry.  Psychiatric: She has a normal mood and affect. Her behavior is normal.  Vitals reviewed.       Assessment & Plan:    Encounter Diagnoses  Name Primary?  Marland Kitchen Uncontrolled diabetes mellitus type 2 without complications, unspecified long term insulin use status ([place]) Yes  . Gastroesophageal reflux disease, esophagitis presence not specified   . Essential hypertension, benign   . Gout without tophus, unspecified cause, unspecified chronicity, unspecified site   . Special screening for malignant neoplasms, colon   . Screening for breast cancer     iFOBT given Get labs drawn- will call with results mammo ordered Pt on list for DM eye exam Pt still needs to attend DM educ class F/u 3 mo. rto sooner prn

## 2015-10-30 ENCOUNTER — Other Ambulatory Visit: Payer: Self-pay | Admitting: Physician Assistant

## 2015-10-30 DIAGNOSIS — Z1211 Encounter for screening for malignant neoplasm of colon: Secondary | ICD-10-CM

## 2015-10-30 LAB — IFOBT (OCCULT BLOOD): IMMUNOLOGICAL FECAL OCCULT BLOOD TEST: NEGATIVE

## 2015-11-12 ENCOUNTER — Ambulatory Visit (HOSPITAL_COMMUNITY): Payer: Self-pay

## 2015-11-19 ENCOUNTER — Ambulatory Visit (HOSPITAL_COMMUNITY)
Admission: RE | Admit: 2015-11-19 | Discharge: 2015-11-19 | Disposition: A | Discharge status: Home / Self Care | Payer: Self-pay | Source: Ambulatory Visit | Attending: Physician Assistant | Admitting: Physician Assistant

## 2016-01-03 ENCOUNTER — Encounter (HOSPITAL_COMMUNITY): Payer: Self-pay | Admitting: *Deleted

## 2016-01-03 ENCOUNTER — Emergency Department (HOSPITAL_COMMUNITY)
Admission: EM | Admit: 2016-01-03 | Discharge: 2016-01-03 | Disposition: A | Discharge status: Home / Self Care | Payer: Self-pay | Attending: Emergency Medicine | Admitting: Emergency Medicine

## 2016-01-03 ENCOUNTER — Emergency Department (HOSPITAL_COMMUNITY): Payer: Self-pay

## 2016-01-03 DIAGNOSIS — Z79899 Other long term (current) drug therapy: Secondary | ICD-10-CM | POA: Insufficient documentation

## 2016-01-03 DIAGNOSIS — E669 Obesity, unspecified: Secondary | ICD-10-CM | POA: Insufficient documentation

## 2016-01-03 DIAGNOSIS — R059 Cough, unspecified: Secondary | ICD-10-CM

## 2016-01-03 DIAGNOSIS — R05 Cough: Secondary | ICD-10-CM | POA: Insufficient documentation

## 2016-01-03 DIAGNOSIS — I10 Essential (primary) hypertension: Secondary | ICD-10-CM | POA: Insufficient documentation

## 2016-01-03 MED ORDER — PREDNISONE 50 MG PO TABS
60.0000 mg | ORAL_TABLET | ORAL | Status: AC
  Administered 2016-01-03: 60 mg via ORAL
  Filled 2016-01-03: qty 1

## 2016-01-03 MED ORDER — PREDNISONE 20 MG PO TABS: 40.0000 mg | ORAL_TABLET | Freq: Every day | ORAL | Status: AC

## 2016-01-03 MED ORDER — ALBUTEROL SULFATE HFA 108 (90 BASE) MCG/ACT IN AERS
2.0000 | INHALATION_SPRAY | Freq: Four times a day (QID) | RESPIRATORY_TRACT | Status: DC
  Administered 2016-01-03: 2 via RESPIRATORY_TRACT
  Filled 2016-01-03: qty 6.7

## 2016-01-03 MED ORDER — HYDROCOD POLST-CPM POLST ER 10-8 MG/5ML PO SUER: 5.0000 mL | Freq: Two times a day (BID) | ORAL | Status: DC | PRN

## 2016-01-03 NOTE — ED Notes (Addendum)
Pt states non-productive cough x 2-3 weeks.  Pt was walking to room in fast track and had a coughing spell, stumbled in the hallway and stated she became dizzy while coughing. Pt placed in wheel chair and taken to ED Rm 1

## 2016-01-03 NOTE — ED Provider Notes (Signed)
CSN: [id number]     Arrival date & time 01/03/16  1301 History   First MD Initiated Contact with Patient 01/03/16 1319     Chief Complaint  Patient presents with  . Cough     (Consider location/radiation/quality/duration/timing/severity/associated sxs/prior Treatment) HPI Patient presents with concern of cough, generalized discomfort. Symptoms have been present for about 3 weeks. During the illness she is tried multiple OTC medications, with no relief. Today, the patient denies any substantial change in her condition, states that her daughter compelled her to come for evaluation. Patient does not smoke, does not drink, states that she is generally well. She denies ongoing fever, weight loss, weight gain, new edema anywhere. No chest pain. No vomiting, no diarrhea. Patient does acknowledge lightheadedness with sustained coughing spells.  Past Medical History  Diagnosis Date  . Obesity   . Diastolic hypertension   . Migraines    Past Surgical History  Procedure Laterality Date  . Partial hysterectomy    . Abdominal hysterectomy    . Knee arthroscopy with medial menisectomy Left 02/09/2014    Procedure: KNEE ARTHROSCOPY WITH MEDIAL MENISECTOMY;  Surgeon: Carole Civil, MD;  Location: [facility];  Service: Orthopedics;  Laterality: Left;   Family History  Problem Relation Age of Onset  . Kidney failure Mother   . Diabetes Mother   . Hypertension Mother   . Stroke Mother   . Diabetes Father   . Heart disease Father    Social History  Substance Use Topics  . Smoking status: Never Smoker   . Smokeless tobacco: None  . Alcohol Use: No   OB History    No data available     Review of Systems  Constitutional:       Per HPI, otherwise negative  HENT:       Per HPI, otherwise negative  Respiratory:       Per HPI, otherwise negative  Cardiovascular:       Per HPI, otherwise negative  Gastrointestinal: Negative for vomiting.  Endocrine:       Negative aside from HPI   Genitourinary:       Neg aside from HPI   Musculoskeletal:       Per HPI, otherwise negative  Skin: Negative.   Neurological: Positive for light-headedness. Negative for syncope.      Allergies  Prednisone  Home Medications   Prior to Admission medications   Medication Sig Start Date End Date Taking? Authorizing Provider  acetaminophen-codeine (TYLENOL #3) 300-30 MG per tablet Take 1-2 tablets by mouth every 4 (four) hours as needed for moderate pain. 10/31/14  Yes Carole Civil, MD  cholecalciferol (VITAMIN D) 400 UNITS TABS tablet Take 800 Units by mouth daily.   Yes Historical Provider, MD  FLUoxetine (PROZAC) 10 MG tablet Take 1 tablet (10 mg total) by mouth daily. 06/12/14  Yes Fayrene Helper, MD  glipiZIDE (GLUCOTROL) 10 MG tablet Take 1 tablet (10 mg total) by mouth 2 (two) times daily before a meal. 08/15/15  Yes Soyla Dryer, PA-C  hydrOXYzine (ATARAX/VISTARIL) 50 MG tablet TAKE ONE TABLET BY MOUTH AT BEDTIME FOR ITCHING AND ANXIETY 03/16/15  Yes Fayrene Helper, MD  indomethacin (INDOCIN) 25 MG capsule Take 25 mg by mouth 3 (three) times daily as needed (for gout).   Yes Historical Provider, MD  lisinopril-hydrochlorothiazide (PRINZIDE,ZESTORETIC) 10-12.5 MG tablet Take 1 tablet by mouth daily. 08/15/15  Yes Soyla Dryer, PA-C  omeprazole (PRILOSEC) 40 MG capsule Take 1 capsule (40 mg total)  by mouth 2 (two) times daily. 08/15/15  Yes Soyla Dryer, PA-C  traZODone (DESYREL) 100 MG tablet Take 100 mg by mouth at bedtime as needed for sleep.    Yes Historical Provider, MD   BP 114/62 mmHg  Pulse 87  Temp(Src) 98.9 F (37.2 C)  Resp 18  Ht 5' (1.524 m)  Wt 189 lb (85.73 kg)  BMI 36.91 kg/m2  SpO2 98% Physical Exam  Constitutional: She is oriented to person, place, and time. She appears well-developed and well-nourished. No distress.  HENT:  Head: Normocephalic and atraumatic.  Eyes: Conjunctivae and EOM are normal.  Cardiovascular: Normal rate and  regular rhythm.   Pulmonary/Chest: Effort normal. No stridor. No respiratory distress. She has decreased breath sounds.  Abdominal: She exhibits no distension.  Musculoskeletal: She exhibits no edema.  Neurological: She is alert and oriented to person, place, and time. No cranial nerve deficit.  Skin: Skin is warm and dry.  Psychiatric: She has a normal mood and affect.  Nursing note and vitals reviewed.   ED Course  Procedures (including critical care time) Labs Review Labs Reviewed - No data to display  Imaging Review Dg Chest 2 View  01/03/2016  CLINICAL DATA:  Cough and congestion for 2 weeks EXAM: CHEST  2 VIEW COMPARISON:  06/07/2013 FINDINGS: Cardiac shadow is stable. Lungs are well aerated bilaterally. No focal infiltrate is seen. Prominent epicardial fat pad is again noted on the right. No acute bony abnormality is seen. IMPRESSION: No acute abnormality noted. Electronically Signed   By: Inez Catalina M.D.   On: 01/03/2016 13:40   I have personally reviewed and evaluated these images and lab results as part of my medical decision-making.  Pulse oximetry 99% room air normal  MDM  Patient presents with weeks of ongoing cough. Here, the patient is awake, alert, hemodynamically stable, in no distress. No evidence for pneumonia, and the patient is afebrile. Patient may have bronchitis. Patient started on a course of steroids, bronchodilator, antitussive medication.   Carmin Muskrat, MD 01/03/16 [phone]

## 2016-01-03 NOTE — Discharge Instructions (Signed)
As discussed, your evaluation today is most consistent with bronchitis. Please take all medication as directed, and use the provided albuterol inhaler every 4 hours for the next 2 days.  Please be sure to follow-up with your physician within one week to insure that you are improving. Return here for concerning changes in your condition.   Cough, Adult A cough helps to clear your throat and lungs. A cough may last only 2-3 weeks (acute), or it may last longer than 8 weeks (chronic). Many different things can cause a cough. A cough may be a sign of an illness or another medical condition. HOME CARE  Pay attention to any changes in your cough.  Take medicines only as told by your doctor.  If you were prescribed an antibiotic medicine, take it as told by your doctor. Do not stop taking it even if you start to feel better.  Talk with your doctor before you try using a cough medicine.  Drink enough fluid to keep your pee (urine) clear or pale yellow.  If the air is dry, use a cold steam vaporizer or humidifier in your home.  Stay away from things that make you cough at work or at home.  If your cough is worse at night, try using extra pillows to raise your head up higher while you sleep.  Do not smoke, and try not to be around smoke. If you need help quitting, ask your doctor.  Do not have caffeine.  Do not drink alcohol.  Rest as needed. GET HELP IF:  You have new problems (symptoms).  You cough up yellow fluid (pus).  Your cough does not get better after 2-3 weeks, or your cough gets worse.  Medicine does not help your cough and you are not sleeping well.  You have pain that gets worse or pain that is not helped with medicine.  You have a fever.  You are losing weight and you do not know why.  You have night sweats. GET HELP RIGHT AWAY IF:  You cough up blood.  You have trouble breathing.  Your heartbeat is very fast.   This information is not intended to  replace advice given to you by your health care provider. Make sure you discuss any questions you have with your health care provider.   Document Released: 07/03/2011 Document Revised: 07/11/2015 Document Reviewed: 12/27/2014 Elsevier Interactive Patient Education [employer].

## 2016-01-03 NOTE — ED Notes (Signed)
Rt called

## 2016-01-14 ENCOUNTER — Ambulatory Visit: Payer: Self-pay | Admitting: Physician Assistant

## 2016-01-15 ENCOUNTER — Ambulatory Visit: Payer: Self-pay | Admitting: Physician Assistant

## 2016-01-16 ENCOUNTER — Encounter: Payer: Self-pay | Admitting: Physician Assistant

## 2016-01-16 ENCOUNTER — Ambulatory Visit: Payer: Self-pay | Admitting: Physician Assistant

## 2016-01-16 VITALS — BP 100/66 | HR 87 | Temp 97.3°F | Ht 60.0 in | Wt 192.5 lb

## 2016-01-16 DIAGNOSIS — I10 Essential (primary) hypertension: Secondary | ICD-10-CM

## 2016-01-16 DIAGNOSIS — E118 Type 2 diabetes mellitus with unspecified complications: Principal | ICD-10-CM

## 2016-01-16 DIAGNOSIS — K219 Gastro-esophageal reflux disease without esophagitis: Secondary | ICD-10-CM

## 2016-01-16 DIAGNOSIS — E1165 Type 2 diabetes mellitus with hyperglycemia: Secondary | ICD-10-CM

## 2016-01-16 DIAGNOSIS — Z13220 Encounter for screening for lipoid disorders: Secondary | ICD-10-CM

## 2016-01-16 DIAGNOSIS — E669 Obesity, unspecified: Secondary | ICD-10-CM

## 2016-01-16 DIAGNOSIS — E559 Vitamin D deficiency, unspecified: Secondary | ICD-10-CM | POA: Insufficient documentation

## 2016-01-16 DIAGNOSIS — N189 Chronic kidney disease, unspecified: Secondary | ICD-10-CM

## 2016-01-16 LAB — LIPID PANEL
Cholesterol: 178 mg/dL (ref 125–200)
HDL: 44 mg/dL — ABNORMAL LOW
LDL Cholesterol: 115 mg/dL
Total CHOL/HDL Ratio: 4 ratio
Triglycerides: 95 mg/dL
VLDL: 19 mg/dL

## 2016-01-16 LAB — COMPLETE METABOLIC PANEL WITHOUT GFR
ALT: 10 U/L (ref 6–29)
AST: 9 U/L — ABNORMAL LOW (ref 10–35)
Albumin: 4 g/dL (ref 3.6–5.1)
Alkaline Phosphatase: 101 U/L (ref 33–130)
BUN: 26 mg/dL — ABNORMAL HIGH (ref 7–25)
CO2: 27 mmol/L (ref 20–31)
Calcium: 10.3 mg/dL (ref 8.6–10.4)
Chloride: 98 mmol/L (ref 98–110)
Creat: 1.4 mg/dL — ABNORMAL HIGH (ref 0.50–1.05)
GFR, Est African American: 50 mL/min — ABNORMAL LOW
GFR, Est Non African American: 44 mL/min — ABNORMAL LOW
Glucose, Bld: 183 mg/dL — ABNORMAL HIGH (ref 65–99)
Potassium: 4.5 mmol/L (ref 3.5–5.3)
Sodium: 136 mmol/L (ref 135–146)
Total Bilirubin: 0.3 mg/dL (ref 0.2–1.2)
Total Protein: 7.6 g/dL (ref 6.1–8.1)

## 2016-01-16 NOTE — Progress Notes (Signed)
BP 100/66 mmHg  Pulse 87  Temp(Src) 97.3 F (36.3 C)  Ht 5' (1.524 m)  Wt 192 lb 8 oz (87.317 kg)  BMI 37.59 kg/m2  SpO2 97%   Subjective:    Patient ID: Kelli Spencer, female    DOB: 1965-12-06, 50 y.o.   MRN: [id number]  HPI: Kelli Spencer is a 50 y.o. female presenting on 01/16/2016 for Diabetes and Hypertension   HPI   She is feeling well.  Still a bit of cough lingering from her recent illness.  She is still going to [facility] for [place] issues.  Relevant past medical, surgical, family and social history reviewed and updated as indicated. Interim medical history since our last visit reviewed. Allergies and medications reviewed and updated.   Current outpatient prescriptions:  .  acetaminophen-codeine (TYLENOL #3) 300-30 MG per tablet, Take 1-2 tablets by mouth every 4 (four) hours as needed for moderate pain., Disp: 120 tablet, Rfl: 5 .  Albuterol Sulfate (PROVENTIL HFA IN), Inhale 2 puffs into the lungs as needed., Disp: , Rfl:  .  cholecalciferol (VITAMIN D) 400 UNITS TABS tablet, Take 800 Units by mouth daily., Disp: , Rfl:  .  FLUoxetine (PROZAC) 10 MG tablet, Take 1 tablet (10 mg total) by mouth daily., Disp: 30 tablet, Rfl: 5 .  glipiZIDE (GLUCOTROL) 10 MG tablet, Take 1 tablet (10 mg total) by mouth 2 (two) times daily before a meal., Disp: 180 tablet, Rfl: 1 .  hydrOXYzine (ATARAX/VISTARIL) 50 MG tablet, TAKE ONE TABLET BY MOUTH AT BEDTIME FOR ITCHING AND ANXIETY, Disp: 30 tablet, Rfl: 0 .  indomethacin (INDOCIN) 25 MG capsule, Take 25 mg by mouth 3 (three) times daily as needed (for gout). Reported on 01/16/2016, Disp: , Rfl:  .  lisinopril-hydrochlorothiazide (PRINZIDE,ZESTORETIC) 10-12.5 MG tablet, Take 1 tablet by mouth daily., Disp: 90 tablet, Rfl: 3 .  omeprazole (PRILOSEC) 40 MG capsule, Take 1 capsule (40 mg total) by mouth 2 (two) times daily., Disp: 180 capsule, Rfl: 1 .  traZODone (DESYREL) 100 MG tablet, Take 100 mg by mouth at bedtime as needed for sleep.  , Disp: , Rfl:    Review of Systems  Constitutional: Negative for fever, chills, diaphoresis, appetite change, fatigue and unexpected weight change.  HENT: Negative for congestion, dental problem, drooling, ear pain, facial swelling, hearing loss, mouth sores, sneezing, sore throat, trouble swallowing and voice change.   Eyes: Negative for pain, discharge, redness, itching and visual disturbance.  Respiratory: Positive for cough. Negative for choking, shortness of breath and wheezing.   Cardiovascular: Negative for chest pain, palpitations and leg swelling.  Gastrointestinal: Negative for vomiting, abdominal pain, diarrhea, constipation and blood in stool.  Endocrine: Negative for cold intolerance, heat intolerance and polydipsia.  Genitourinary: Negative for dysuria, hematuria and decreased urine volume.  Musculoskeletal: Positive for gait problem. Negative for back pain and arthralgias.  Skin: Negative for rash.  Allergic/Immunologic: Negative for environmental allergies.  Neurological: Positive for headaches. Negative for seizures, syncope and light-headedness.  Hematological: Negative for adenopathy.  Psychiatric/Behavioral: Negative for suicidal ideas, dysphoric mood and agitation. The patient is not nervous/anxious.     Per HPI unless specifically indicated above     Objective:    BP 100/66 mmHg  Pulse 87  Temp(Src) 97.3 F (36.3 C)  Ht 5' (1.524 m)  Wt 192 lb 8 oz (87.317 kg)  BMI 37.59 kg/m2  SpO2 97%  Wt Readings from Last 3 Encounters:  01/16/16 192 lb 8 oz (87.317 kg)  01/03/16  189 lb (85.73 kg)  10/15/15 189 lb 8 oz (85.957 kg)    Physical Exam  Constitutional: She is oriented to person, place, and time. She appears well-developed and well-nourished.  HENT:  Head: Normocephalic and atraumatic.  Neck: Neck supple.  Cardiovascular: Normal rate and regular rhythm.   No caroltid bruits  Pulmonary/Chest: Effort normal and breath sounds normal.  Abdominal: Soft.  Bowel sounds are normal. She exhibits no mass. There is no hepatosplenomegaly. There is no tenderness.  Musculoskeletal: She exhibits no edema.  Lymphadenopathy:    She has no cervical adenopathy.  Neurological: She is alert and oriented to person, place, and time.  Skin: Skin is warm and dry.  Psychiatric: She has a normal mood and affect. Her behavior is normal.  Vitals reviewed.       Assessment & Plan:   Encounter Diagnoses  Name Primary?  Marland Kitchen Uncontrolled type 2 diabetes mellitus with complication, unspecified long term insulin use status ([place]) Yes  . Essential hypertension   . Gastroesophageal reflux disease, esophagitis presence not specified   . Vitamin D deficiency   . Obesity, unspecified   . Screening cholesterol level     -continue current medications -She has appointment for DM eye exam next week -Check lipids and a1c this week. Will call with results -F/u OV 3 months.  RTO sooner prn

## 2016-01-17 LAB — HEMOGLOBIN A1C
HEMOGLOBIN A1C: 8.8 % — AB (ref <5.7)
MEAN PLASMA GLUCOSE: 206 mg/dL — AB (ref <117)

## 2016-01-31 ENCOUNTER — Encounter: Payer: Self-pay | Admitting: Physician Assistant

## 2016-01-31 ENCOUNTER — Ambulatory Visit: Payer: Medicare Other | Admitting: Physician Assistant

## 2016-01-31 VITALS — BP 104/76 | HR 88 | Temp 97.3°F | Ht 60.0 in | Wt 192.3 lb

## 2016-01-31 DIAGNOSIS — N189 Chronic kidney disease, unspecified: Secondary | ICD-10-CM

## 2016-01-31 DIAGNOSIS — R059 Cough, unspecified: Secondary | ICD-10-CM

## 2016-01-31 DIAGNOSIS — E669 Obesity, unspecified: Secondary | ICD-10-CM

## 2016-01-31 DIAGNOSIS — K219 Gastro-esophageal reflux disease without esophagitis: Secondary | ICD-10-CM

## 2016-01-31 DIAGNOSIS — E1165 Type 2 diabetes mellitus with hyperglycemia: Secondary | ICD-10-CM

## 2016-01-31 DIAGNOSIS — E118 Type 2 diabetes mellitus with unspecified complications: Principal | ICD-10-CM

## 2016-01-31 DIAGNOSIS — R05 Cough: Secondary | ICD-10-CM

## 2016-01-31 DIAGNOSIS — I10 Essential (primary) hypertension: Secondary | ICD-10-CM

## 2016-01-31 MED ORDER — SITAGLIPTIN PHOSPHATE 100 MG PO TABS: 100.0000 mg | ORAL_TABLET | Freq: Every day | ORAL | Status: DC

## 2016-01-31 MED ORDER — BENZONATATE 100 MG PO CAPS: ORAL_CAPSULE | ORAL | Status: DC

## 2016-01-31 NOTE — Progress Notes (Signed)
BP 104/76 mmHg  Pulse 88  Temp(Src) 97.3 F (36.3 C)  Ht 5' (1.524 m)  Wt 192 lb 4.8 oz (87.227 kg)  BMI 37.56 kg/m2  SpO2 98%   Subjective:    Patient ID: Kelli Spencer, female    DOB: Apr 02, 1966, 50 y.o.   MRN: [id number]  HPI: Kelli Spencer is a 50 y.o. female presenting on 01/31/2016 for Diabetes   HPI   Discussed with pt that most recent labs show a1c of 8.8.  Pt was on metformin in the past but she had to stop taking it due to GI side effects.   Relevant past medical, surgical, family and social history reviewed and updated as indicated. Interim medical history since our last visit reviewed. Allergies and medications reviewed and updated.  Current outpatient prescriptions:  .  acetaminophen-codeine (TYLENOL #3) 300-30 MG per tablet, Take 1-2 tablets by mouth every 4 (four) hours as needed for moderate pain., Disp: 120 tablet, Rfl: 5 .  Albuterol Sulfate (PROVENTIL HFA IN), Inhale 2 puffs into the lungs as needed., Disp: , Rfl:  .  cholecalciferol (VITAMIN D) 400 UNITS TABS tablet, Take 800 Units by mouth daily., Disp: , Rfl:  .  FLUoxetine (PROZAC) 10 MG tablet, Take 1 tablet (10 mg total) by mouth daily., Disp: 30 tablet, Rfl: 5 .  glipiZIDE (GLUCOTROL) 10 MG tablet, Take 1 tablet (10 mg total) by mouth 2 (two) times daily before a meal., Disp: 180 tablet, Rfl: 1 .  hydrOXYzine (ATARAX/VISTARIL) 50 MG tablet, TAKE ONE TABLET BY MOUTH AT BEDTIME FOR ITCHING AND ANXIETY, Disp: 30 tablet, Rfl: 0 .  indomethacin (INDOCIN) 25 MG capsule, Take 25 mg by mouth 3 (three) times daily as needed (for gout). Reported on 01/16/2016, Disp: , Rfl:  .  lisinopril-hydrochlorothiazide (PRINZIDE,ZESTORETIC) 10-12.5 MG tablet, Take 1 tablet by mouth daily., Disp: 90 tablet, Rfl: 3 .  omeprazole (PRILOSEC) 40 MG capsule, Take 1 capsule (40 mg total) by mouth 2 (two) times daily., Disp: 180 capsule, Rfl: 1 .  traZODone (DESYREL) 100 MG tablet, Take 100 mg by mouth at bedtime as needed for  sleep. , Disp: , Rfl:    Review of Systems  Constitutional: Positive for chills. Negative for fever, diaphoresis, appetite change, fatigue and unexpected weight change.  HENT: Positive for dental problem and sneezing. Negative for congestion, drooling, ear pain, facial swelling, hearing loss, mouth sores, sore throat, trouble swallowing and voice change.   Eyes: Negative for pain, discharge, redness, itching and visual disturbance.  Respiratory: Positive for cough and shortness of breath. Negative for choking and wheezing.   Cardiovascular: Positive for leg swelling. Negative for chest pain and palpitations.  Gastrointestinal: Negative for vomiting, abdominal pain, diarrhea, constipation and blood in stool.  Endocrine: Negative for cold intolerance, heat intolerance and polydipsia.  Genitourinary: Negative for dysuria, hematuria and decreased urine volume.  Musculoskeletal: Positive for gait problem. Negative for back pain and arthralgias.  Skin: Negative for rash.  Allergic/Immunologic: Negative for environmental allergies.  Neurological: Positive for headaches. Negative for seizures, syncope and light-headedness.  Hematological: Negative for adenopathy.  Psychiatric/Behavioral: Positive for dysphoric mood. Negative for suicidal ideas and agitation. The patient is nervous/anxious.     Per HPI unless specifically indicated above     Objective:    BP 104/76 mmHg  Pulse 88  Temp(Src) 97.3 F (36.3 C)  Ht 5' (1.524 m)  Wt 192 lb 4.8 oz (87.227 kg)  BMI 37.56 kg/m2  SpO2 98%  Wt Readings from  Last 3 Encounters:  01/31/16 192 lb 4.8 oz (87.227 kg)  01/16/16 192 lb 8 oz (87.317 kg)  01/03/16 189 lb (85.73 kg)    Physical Exam  Constitutional: She is oriented to person, place, and time. She appears well-developed and well-nourished.  Pt smells very heavily of perfume  HENT:  Head: Normocephalic and atraumatic.  Neck: Neck supple.  Cardiovascular: Normal rate and regular rhythm.    Pulmonary/Chest: Effort normal and breath sounds normal.  Abdominal: Soft. Bowel sounds are normal.  Musculoskeletal: She exhibits no edema.  Lymphadenopathy:    She has no cervical adenopathy.  Neurological: She is alert and oriented to person, place, and time.  Skin: Skin is warm and dry.  Psychiatric: She has a normal mood and affect. Her behavior is normal.  Vitals reviewed.   Results for orders placed or performed in visit on 01/16/16  HgB A1c  Result Value Ref Range   Hgb A1c MFr Bld 8.8 (H) <5.7 %   Mean Plasma Glucose 206 (H) <117 mg/dL  Lipid Profile  Result Value Ref Range   Cholesterol 178 125 - 200 mg/dL   Triglycerides 95 <150 mg/dL   HDL 44 (L) >=46 mg/dL   Total CHOL/HDL Ratio 4.0 <=5.0 Ratio   VLDL 19 <30 mg/dL   LDL Cholesterol 115 <130 mg/dL  COMPLETE METABOLIC PANEL WITH GFR  Result Value Ref Range   Sodium 136 135 - 146 mmol/L   Potassium 4.5 3.5 - 5.3 mmol/L   Chloride 98 98 - 110 mmol/L   CO2 27 20 - 31 mmol/L   Glucose, Bld 183 (H) 65 - 99 mg/dL   BUN 26 (H) 7 - 25 mg/dL   Creat 1.40 (H) 0.50 - 1.05 mg/dL   Total Bilirubin 0.3 0.2 - 1.2 mg/dL   Alkaline Phosphatase 101 33 - 130 U/L   AST 9 (L) 10 - 35 U/L   ALT 10 6 - 29 U/L   Total Protein 7.6 6.1 - 8.1 g/dL   Albumin 4.0 3.6 - 5.1 g/dL   Calcium 10.3 8.6 - 10.4 mg/dL   GFR, Est African American 50 (L) >=60 mL/min   GFR, Est Non African American 44 (L) >=60 mL/min      Assessment & Plan:   Encounter Diagnoses  Name Primary?  Marland Kitchen Uncontrolled type 2 diabetes mellitus with complication, unspecified long term insulin use status ([place]) Yes  . Cough   . Essential hypertension   . Gastroesophageal reflux disease, esophagitis presence not specified   . Obesity, unspecified   . CKD (chronic kidney disease), unspecified stage     -reviewed labs with pt -Sign up for [facility] and order [place].  Samples given in office today.  -recommended she avoid perfumes to minimize irritant cough -rx  tessalon for cough -will refer to nephrology if kidney function not improved at next OV -F/u 3 months. RTO sooner prn

## 2016-02-01 DIAGNOSIS — N189 Chronic kidney disease, unspecified: Secondary | ICD-10-CM | POA: Insufficient documentation

## 2016-02-18 ENCOUNTER — Other Ambulatory Visit: Payer: Self-pay | Admitting: Physician Assistant

## 2016-02-18 MED ORDER — ALBUTEROL SULFATE HFA 108 (90 BASE) MCG/ACT IN AERS: 2.0000 | INHALATION_SPRAY | Freq: Four times a day (QID) | RESPIRATORY_TRACT | Status: DC | PRN

## 2016-04-07 ENCOUNTER — Other Ambulatory Visit: Payer: Self-pay

## 2016-04-07 DIAGNOSIS — I10 Essential (primary) hypertension: Secondary | ICD-10-CM

## 2016-04-07 DIAGNOSIS — N189 Chronic kidney disease, unspecified: Secondary | ICD-10-CM

## 2016-04-07 DIAGNOSIS — E559 Vitamin D deficiency, unspecified: Secondary | ICD-10-CM

## 2016-04-07 DIAGNOSIS — E118 Type 2 diabetes mellitus with unspecified complications: Principal | ICD-10-CM

## 2016-04-07 DIAGNOSIS — E1165 Type 2 diabetes mellitus with hyperglycemia: Secondary | ICD-10-CM

## 2016-04-08 LAB — BASIC METABOLIC PANEL
BUN: 27 mg/dL — AB (ref 7–25)
CALCIUM: 9.8 mg/dL (ref 8.6–10.4)
CO2: 22 mmol/L (ref 20–31)
Chloride: 106 mmol/L (ref 98–110)
Creat: 1.37 mg/dL — ABNORMAL HIGH (ref 0.50–1.05)
GLUCOSE: 120 mg/dL — AB (ref 65–99)
Potassium: 4.7 mmol/L (ref 3.5–5.3)
SODIUM: 139 mmol/L (ref 135–146)

## 2016-04-08 LAB — HEMOGLOBIN A1C
Hgb A1c MFr Bld: 7.8 % — ABNORMAL HIGH (ref <5.7)
Mean Plasma Glucose: 177 mg/dL

## 2016-04-09 LAB — VITAMIN D 25 HYDROXY (VIT D DEFICIENCY, FRACTURES): VIT D 25 HYDROXY: 28 ng/mL — AB (ref 30–100)

## 2016-04-14 ENCOUNTER — Ambulatory Visit: Payer: Self-pay | Admitting: Physician Assistant

## 2016-05-01 ENCOUNTER — Encounter: Payer: Self-pay | Admitting: Physician Assistant

## 2016-05-01 ENCOUNTER — Ambulatory Visit: Payer: Medicare Other | Admitting: Physician Assistant

## 2016-05-01 ENCOUNTER — Ambulatory Visit: Payer: Self-pay | Admitting: Family Medicine

## 2016-05-01 VITALS — BP 90/60 | HR 97 | Temp 97.3°F | Ht 60.0 in | Wt 189.2 lb

## 2016-05-01 DIAGNOSIS — N189 Chronic kidney disease, unspecified: Secondary | ICD-10-CM

## 2016-05-01 DIAGNOSIS — E1165 Type 2 diabetes mellitus with hyperglycemia: Secondary | ICD-10-CM

## 2016-05-01 DIAGNOSIS — K219 Gastro-esophageal reflux disease without esophagitis: Secondary | ICD-10-CM

## 2016-05-01 DIAGNOSIS — E118 Type 2 diabetes mellitus with unspecified complications: Principal | ICD-10-CM

## 2016-05-01 DIAGNOSIS — E669 Obesity, unspecified: Secondary | ICD-10-CM

## 2016-05-01 DIAGNOSIS — I10 Essential (primary) hypertension: Secondary | ICD-10-CM

## 2016-05-01 NOTE — Patient Instructions (Signed)
How and Where to Give Subcutaneous Insulin Injections, Adult People with type 1 diabetes must take insulin since their bodies do not make it. People with type 2 diabetes may require insulin. There are many different types of insulin as well as other injectable diabetes medicines that are meant to be injected into the fat layer under your skin. The type of insulin or injectable diabetes medicine you take may determine how many injections you give yourself and when to take the injections.  CHOOSING A SITE FOR INJECTION Insulin absorption varies from site to site. As with any injectable medication it is best for the insulin to be injected within the same body region. However, do not inject the insulin in the same spot each time. Rotating the spots you give your injections will prevent inflammation or tissue breakdown. There are four main regions that can be used for injections. The regions include the:  Abdomen (preferred region, especially for non-insulin injectable diabetes medicine).  Front and upper outer sides of thighs.  Back of upper arm.  Buttocks. USING INSULIN PENS 1. Wash your hands with soap and water. 2. If you are using the "cloudy" insulin, roll the pen between your palms several times or rotate the pen top to bottom several times. 3. Remove the insulin pen cap. 4. Clean the rubber stopper of the cartridge with an alcohol wipe. 5. Remove the protective paper tab from the disposable needle. 6. Screw the needle onto the pen. 7. Remove the outer plastic needle cover. 8. Remove the inner plastic needle cover. 9. Prime the insulin pen by turning the button (dial) to 2 units. Hold the pen with the needle pointing up, and push the dial on the opposite end until a drop of insulin appears at the needle tip. If no insulin appears, repeat this step. 10. Dial the number of units of insulin you will inject. 11. Use an alcohol wipe to clean the area of the body to be injected. 12. Pinch up 1  inch of skin and hold it. 13. Put the needle straight into the skin (90-degree angle). 14. Push the dial down to push the insulin into the fat tissue. 15. Count to 10 slowly. Then, remove the needle from the fat tissue. 16. Carefully replace the larger outer plastic needle cover over the needle and unscrew the capped needle. THROWING AWAY SUPPLIES  Discard used needles in a puncture proof sharps disposal container. Follow disposal regulations for the area where you live.  Vials and empty disposable pens may be thrown away in the regular trash.   This information is not intended to replace advice given to you by your health care provider. Make sure you discuss any questions you have with your health care provider.   Document Released: 01/10/2004 Document Revised: 11/10/2014 Document Reviewed: 03/29/2013 Elsevier Interactive Patient Education [employer].

## 2016-05-01 NOTE — Progress Notes (Signed)
BP 90/60 mmHg  Pulse 97  Temp(Src) 97.3 F (36.3 C)  Ht 5' (1.524 m)  Wt 189 lb 3.2 oz (85.821 kg)  BMI 36.95 kg/m2  SpO2 99%   Subjective:    Patient ID: Kelli Spencer, female    DOB: September 26, 1966, 50 y.o.   MRN: [id number]  HPI: Kelli Spencer is a 50 y.o. female presenting on 05/01/2016 for Diabetes   HPI    She is still going to [facility] but she quit taking her medicine  Pt hasn't been taking her vit d lately  Relevant past medical, surgical, family and social history reviewed and updated as indicated. Interim medical history since our last visit reviewed. Allergies and medications reviewed and updated.   Current outpatient prescriptions:  .  albuterol (PROVENTIL HFA;VENTOLIN HFA) 108 (90 Base) MCG/ACT inhaler, Inhale 2 puffs into the lungs every 6 (six) hours as needed for wheezing or shortness of breath., Disp: 3 Inhaler, Rfl: 1 .  cholecalciferol (VITAMIN D) 400 UNITS TABS tablet, Take 800 Units by mouth daily., Disp: , Rfl:  .  glipiZIDE (GLUCOTROL) 10 MG tablet, Take 1 tablet (10 mg total) by mouth 2 (two) times daily before a meal., Disp: 180 tablet, Rfl: 1 .  hydrOXYzine (ATARAX/VISTARIL) 50 MG tablet, TAKE ONE TABLET BY MOUTH AT BEDTIME FOR ITCHING AND ANXIETY, Disp: 30 tablet, Rfl: 0 .  indomethacin (INDOCIN) 25 MG capsule, Take 25 mg by mouth every 8 (eight) hours as needed (for gout). Reported on 01/16/2016, Disp: , Rfl:  .  lisinopril-hydrochlorothiazide (PRINZIDE,ZESTORETIC) 10-12.5 MG tablet, Take 1 tablet by mouth daily., Disp: 90 tablet, Rfl: 3 .  omeprazole (PRILOSEC) 40 MG capsule, Take 1 capsule (40 mg total) by mouth 2 (two) times daily., Disp: 180 capsule, Rfl: 1 .  sitaGLIPtin (JANUVIA) 100 MG tablet, Take 1 tablet (100 mg total) by mouth daily., Disp: 90 tablet, Rfl: 1   Review of Systems  Constitutional: Negative for fever, chills, diaphoresis, appetite change, fatigue and unexpected weight change.  HENT: Negative for congestion, dental problem,  drooling, ear pain, facial swelling, hearing loss, mouth sores, sneezing, sore throat, trouble swallowing and voice change.   Eyes: Negative for pain, discharge, redness, itching and visual disturbance.  Respiratory: Positive for wheezing. Negative for cough, choking and shortness of breath.   Cardiovascular: Negative for chest pain, palpitations and leg swelling.  Gastrointestinal: Negative for vomiting, abdominal pain, diarrhea, constipation and blood in stool.  Endocrine: Positive for cold intolerance. Negative for heat intolerance and polydipsia.  Genitourinary: Negative for dysuria, hematuria and decreased urine volume.  Musculoskeletal: Positive for arthralgias and gait problem. Negative for back pain.  Skin: Negative for rash.  Allergic/Immunologic: Negative for environmental allergies.  Neurological: Negative for seizures, syncope, light-headedness and headaches.  Hematological: Negative for adenopathy.  Psychiatric/Behavioral: Negative for suicidal ideas, dysphoric mood and agitation. The patient is not nervous/anxious.     Per HPI unless specifically indicated above     Objective:    BP 90/60 mmHg  Pulse 97  Temp(Src) 97.3 F (36.3 C)  Ht 5' (1.524 m)  Wt 189 lb 3.2 oz (85.821 kg)  BMI 36.95 kg/m2  SpO2 99%  Wt Readings from Last 3 Encounters:  05/01/16 189 lb 3.2 oz (85.821 kg)  01/31/16 192 lb 4.8 oz (87.227 kg)  01/16/16 192 lb 8 oz (87.317 kg)    Physical Exam  Constitutional: She is oriented to person, place, and time. She appears well-developed and well-nourished.  Still wearing heavy fragrance  HENT:  Head: Normocephalic and atraumatic.  Neck: Neck supple.  Cardiovascular: Normal rate and regular rhythm.   Pulmonary/Chest: Effort normal and breath sounds normal.  Abdominal: Soft. Bowel sounds are normal. She exhibits no mass. There is no hepatosplenomegaly. There is no tenderness.  Musculoskeletal: She exhibits no edema.  Lymphadenopathy:    She has no  cervical adenopathy.  Neurological: She is alert and oriented to person, place, and time.  Skin: Skin is warm and dry.  Psychiatric: She has a normal mood and affect. Her behavior is normal.  Vitals reviewed.      Results for orders placed or performed in visit on 04/07/16  HgB A1c  Result Value Ref Range   Hgb A1c MFr Bld 7.8 (H) <5.7 %   Mean Plasma Glucose 177 mg/dL  Basic Metabolic Panel (BMET)  Result Value Ref Range   Sodium 139 135 - 146 mmol/L   Potassium 4.7 3.5 - 5.3 mmol/L   Chloride 106 98 - 110 mmol/L   CO2 22 20 - 31 mmol/L   Glucose, Bld 120 (H) 65 - 99 mg/dL   BUN 27 (H) 7 - 25 mg/dL   Creat 1.37 (H) 0.50 - 1.05 mg/dL   Calcium 9.8 8.6 - 10.4 mg/dL  Vitamin D (25 hydroxy)  Result Value Ref Range   Vit D, 25-Hydroxy 28 (L) 30 - 100 ng/mL      Assessment & Plan:   Encounter Diagnoses  Name Primary?  Marland Kitchen Uncontrolled type 2 diabetes mellitus with complication, unspecified long term insulin use status (HCC) Yes  . CKD (chronic kidney disease), unspecified stage   . Obesity, unspecified   . Essential hypertension   . Gastroesophageal reflux disease, esophagitis presence not specified     -reviewed labs with pt -Again requested pt avoid using heavy perfumes before her appointment -Start lantus 10 u/night.  Check fbs qam. Notify office for fbs <70 or >300.  Pt counseled on proper use/administration of insulin -F/u with bs log 4 wk

## 2016-05-02 ENCOUNTER — Ambulatory Visit: Payer: Self-pay | Admitting: Family Medicine

## 2016-05-05 ENCOUNTER — Other Ambulatory Visit (HOSPITAL_COMMUNITY)
Admission: RE | Admit: 2016-05-05 | Discharge: 2016-05-05 | Disposition: A | Discharge status: Home / Self Care | Payer: Medicare Other | Source: Other Acute Inpatient Hospital | Attending: Family Medicine | Admitting: Family Medicine

## 2016-05-05 ENCOUNTER — Encounter: Payer: Self-pay | Admitting: Family Medicine

## 2016-05-05 ENCOUNTER — Ambulatory Visit (INDEPENDENT_AMBULATORY_CARE_PROVIDER_SITE_OTHER): Payer: Medicare Other | Admitting: Family Medicine

## 2016-05-05 VITALS — BP 120/72 | HR 67 | Resp 16 | Ht 60.0 in | Wt 188.1 lb

## 2016-05-05 DIAGNOSIS — Z1159 Encounter for screening for other viral diseases: Secondary | ICD-10-CM

## 2016-05-05 DIAGNOSIS — Z2821 Immunization not carried out because of patient refusal: Secondary | ICD-10-CM

## 2016-05-05 DIAGNOSIS — E785 Hyperlipidemia, unspecified: Secondary | ICD-10-CM | POA: Diagnosis not present

## 2016-05-05 DIAGNOSIS — E669 Obesity, unspecified: Secondary | ICD-10-CM

## 2016-05-05 DIAGNOSIS — E119 Type 2 diabetes mellitus without complications: Secondary | ICD-10-CM | POA: Diagnosis not present

## 2016-05-05 DIAGNOSIS — I10 Essential (primary) hypertension: Secondary | ICD-10-CM | POA: Diagnosis not present

## 2016-05-05 DIAGNOSIS — N189 Chronic kidney disease, unspecified: Secondary | ICD-10-CM

## 2016-05-05 DIAGNOSIS — E559 Vitamin D deficiency, unspecified: Secondary | ICD-10-CM

## 2016-05-05 DIAGNOSIS — L299 Pruritus, unspecified: Secondary | ICD-10-CM

## 2016-05-05 DIAGNOSIS — E1165 Type 2 diabetes mellitus with hyperglycemia: Secondary | ICD-10-CM

## 2016-05-05 DIAGNOSIS — Z1211 Encounter for screening for malignant neoplasm of colon: Secondary | ICD-10-CM | POA: Diagnosis not present

## 2016-05-05 DIAGNOSIS — Z114 Encounter for screening for human immunodeficiency virus [HIV]: Secondary | ICD-10-CM

## 2016-05-05 DIAGNOSIS — E1169 Type 2 diabetes mellitus with other specified complication: Secondary | ICD-10-CM

## 2016-05-05 DIAGNOSIS — E118 Type 2 diabetes mellitus with unspecified complications: Secondary | ICD-10-CM

## 2016-05-05 MED ORDER — PRAVASTATIN SODIUM 10 MG PO TABS: 10.0000 mg | ORAL_TABLET | Freq: Every day | ORAL | Status: DC

## 2016-05-05 MED ORDER — LINAGLIPTIN 5 MG PO TABS: 5.0000 mg | ORAL_TABLET | Freq: Every day | ORAL | Status: DC

## 2016-05-05 NOTE — Assessment & Plan Note (Signed)
Hyperlipidemia:Low fat diet discussed and encouraged.   Lipid Panel  Lab Results  Component Value Date   CHOL 178 01/16/2016   HDL 44* 01/16/2016   LDLCALC 115 01/16/2016   TRIG 95 01/16/2016   CHOLHDL 4.0 01/16/2016   Start pravachol daily Updated lab needed at/ before next visit.

## 2016-05-05 NOTE — Patient Instructions (Addendum)
[place] to medicare in  3 month, call if you need me sooner  New additionall med for diabetes is trdjenta one daily  New for cholesterol and heart protection is pravachol one at bedtime  You are referred to diabetic educator and also toDr Oneida Alar for avg risk screening colonoscopy  Microalb today  Please work on good  health habits so that your health will improve. 1. Commitment to daily physical activity for 30 to 60  minutes, if you are able to do this.  2. Commitment to wise food choices. Aim for half of your  food intake to be vegetable and fruit, one quarter starchy foods, and one quarter protein. Try to eat on a regular schedule  3 meals per day, snacking between meals should be limited to vegetables or fruits or small portions of nuts. 64 ounces of water per day is generally recommended, unless you have specific health conditions, like heart failure or kidney failure where you will need to limit fluid intake.  3. Commitment to sufficient and a  good quality of physical and mental rest daily, generally between 6 to 8 hours per day.  WITH PERSISTANCE AND PERSEVERANCE, THE IMPOSSIBLE , BECOMES THE NORM! Thank you  for choosing [facility]. We consider it a privelige to serve you.  Delivering excellent health care in a caring and  compassionate way is our goal.  Partnering with you,  so that together we can achieve this goal is our strategy.   CBC, fasting lipid, cmp and eGFr, HBA1C and TSH for next visit in 3 month

## 2016-05-05 NOTE — Assessment & Plan Note (Signed)
Improved, but still not at goal, add tradgenta one daily and refer to diabetic ed Kelli Spencer is reminded of the importance of commitment to daily physical activity for 30 minutes or more, as able and the need to limit carbohydrate intake to 30 to 60 grams per meal to help with blood sugar control.   The need to take medication as prescribed, test blood sugar as directed, and to call between visits if there is a concern that blood sugar is uncontrolled is also discussed.   Ms. Failing is reminded of the importance of daily foot exam, annual eye examination, and good blood sugar, blood pressure and cholesterol control.  Diabetic Labs Latest Ref Rng 04/07/2016 01/16/2016 10/15/2015 12/05/2014 06/12/2014  HbA1c <5.7 % 7.8(H) 8.8(H) 7.5(H) 7.1(H) 6.6(H)  Microalbumin 0.00 - 1.89 mg/dL - - - - 0.73  Micro/Creat Ratio 0.0 - 30.0 mg/g - - - - 3.2  Chol 125 - 200 mg/dL - 178 - 184 156  HDL >=46 mg/dL - 44(L) - 47 45  Calc LDL <130 mg/dL - 115 - 125(H) 98  Triglycerides <150 mg/dL - 95 - 62 63  Creatinine 0.50 - 1.05 mg/dL 1.37(H) 1.40(H) 1.20(H) 1.29(H) 1.26(H)   BP/Weight 05/05/2016 05/01/2016 01/31/2016 01/16/2016 01/03/2016 10/15/2015 0000000  Systolic BP 123456 90 123456 [id number]  Diastolic BP 72 60 76 66 62 80 72  Wt. (Lbs) 188.12 189.2 192.3 192.5 189 189.5 192.4  BMI 36.74 36.95 37.56 37.59 36.91 37.01 38.22   Foot/eye exam completion dates 08/15/2015 06/12/2014  Foot Form Completion Done Done

## 2016-05-06 LAB — MICROALBUMIN / CREATININE URINE RATIO: CREATININE, UR: 107.8 mg/dL

## 2016-05-07 NOTE — Assessment & Plan Note (Signed)
Controlled, no change in medication  

## 2016-05-07 NOTE — Assessment & Plan Note (Signed)
Deteriorated. Patient re-educated about  the importance of commitment to a  minimum of 150 minutes of exercise per week.  The importance of healthy food choices with portion control discussed. Encouraged to start a food diary, count calories and to consider  joining a support group. Sample diet sheets offered. Goals set by the patient for the next several months.   Weight /BMI 05/05/2016 05/01/2016 01/31/2016  WEIGHT 188 lb 1.9 oz 189 lb 3.2 oz 192 lb 4.8 oz  HEIGHT 5\' 0"  5\' 0"  5\' 0"   BMI 36.74 kg/m2 36.95 kg/m2 37.56 kg/m2  Unchanged Patient re-educated about  the importance of commitment to a  minimum of 150 minutes of exercise per week.  The importance of healthy food choices with portion control discussed. Encouraged to start a food diary, count calories and to consider  joining a support group. Sample diet sheets offered. Goals set by the patient for the next several months.   Weight /BMI 05/05/2016 05/01/2016 01/31/2016  WEIGHT 188 lb 1.9 oz 189 lb 3.2 oz 192 lb 4.8 oz  HEIGHT 5\' 0"  5\' 0"  5\' 0"   BMI 36.74 kg/m2 36.95 kg/m2 37.56 kg/m2

## 2016-05-07 NOTE — Assessment & Plan Note (Signed)
Controlled, no change in medication DASH diet and commitment to daily physical activity for a minimum of 30 minutes discussed and encouraged, as a part of hypertension management. The importance of attaining a healthy weight is also discussed.  BP/Weight 05/05/2016 05/01/2016 01/31/2016 01/16/2016 01/03/2016 10/15/2015 0000000  Systolic BP 123456 90 123456 [id number]  Diastolic BP 72 60 76 66 62 80 72  Wt. (Lbs) 188.12 189.2 192.3 192.5 189 189.5 192.4  BMI 36.74 36.95 37.56 37.59 36.91 37.01 38.22

## 2016-05-07 NOTE — Assessment & Plan Note (Addendum)
Kelli Spencer is reminded of the importance of commitment to daily physical activity for 30 minutes or more, as able and the need to limit carbohydrate intake to 30 to 60 grams per meal to help with blood sugar control.   The need to take medication as prescribed, test blood sugar as directed, and to call between visits if there is a concern that blood sugar is uncontrolled is also discussed.   Kelli Spencer is reminded of the importance of daily foot exam, annual eye examination, and good blood sugar, blood pressure and cholesterol control. Add tradjenta daily, refer to diabetic educator also  Diabetic Labs Latest Ref Rng 05/05/2016 04/07/2016 01/16/2016 10/15/2015 12/05/2014  HbA1c <5.7 % - 7.8(H) 8.8(H) 7.5(H) 7.1(H)  Microalbumin Not Estab. ug/mL <3.0(H) - - - -  Micro/Creat Ratio 0.0 - 30.0 mg/g creat <2.8 - - - -  Chol 125 - 200 mg/dL - - 178 - 184  HDL >=46 mg/dL - - 44(L) - 47  Calc LDL <130 mg/dL - - 115 - 125(H)  Triglycerides <150 mg/dL - - 95 - 62  Creatinine 0.50 - 1.05 mg/dL - 1.37(H) 1.40(H) 1.20(H) 1.29(H)   BP/Weight 05/05/2016 05/01/2016 01/31/2016 01/16/2016 01/03/2016 10/15/2015 0000000  Systolic BP 123456 90 123456 [id number] [id number]  Diastolic BP 72 60 76 66 62 80 72  Wt. (Lbs) 188.12 189.2 192.3 192.5 189 189.5 192.4  BMI 36.74 36.95 37.56 37.59 36.91 37.01 38.22   Foot/eye exam completion dates 08/15/2015 06/12/2014  Foot Form Completion Done Done

## 2016-05-07 NOTE — Assessment & Plan Note (Signed)
Refusing indicated vaccine, re educated

## 2016-05-07 NOTE — Assessment & Plan Note (Signed)
Updated lab needed at/ before next visit. Continue daily supplement

## 2016-05-07 NOTE — Assessment & Plan Note (Signed)
Colonoscopy past due, asymptomatic , will refer for screening

## 2016-05-07 NOTE — Progress Notes (Signed)
Subjective:    Patient ID: Kelli Spencer, female    DOB: 03-Apr-1966, 50 y.o.   MRN: [id number]  HPI    Kelli Spencer     MRN: [id number]      DOB: 03-28-1966   HPI [name] is here for follow up and re-evaluation of chronic medical conditions, medication management and review of any available recent lab and radiology data.  Preventive health is updated, specifically  Cancer screening and Immunization.  Plans to get updated in all. Questions or concerns regarding consultations or procedures which the PT has had in the interim are  Addressed.[facility], now has disability. The PT denies any adverse reactions to current medications since the last visit.  Denies polyuria, polydipsia, blurred vision , or hypoglycemic episodes.  ROS Denies recent fever or chills. Denies sinus pressure, nasal congestion, ear pain or sore throat. Denies chest congestion, productive cough or wheezing. Denies chest pains, palpitations and leg swelling Denies abdominal pain, nausea, vomiting,diarrhea or constipation.   Denies dysuria, frequency, hesitancy or incontinence. Ongoing chronic knee and low back pain Denies headaches, seizures, numbness, or tingling. Denies depression, anxiety or insomnia. Denies skin break down or rash.   PE  BP 120/72 mmHg  Pulse 67  Resp 16  Ht 5' (1.524 m)  Wt 188 lb 1.9 oz (85.331 kg)  BMI 36.74 kg/m2  SpO2 99%  Patient alert and oriented and in no cardiopulmonary distress.  HEENT: No facial asymmetry, EOMI,   oropharynx pink and moist.  Neck supple no JVD, no mass.  Chest: Clear to auscultation bilaterally.  CVS: S1, S2 no murmurs, no S3.Regular rate.  ABD: Soft non tender.   Ext: No edema  MS: Adequate ROM spine, shoulders, hips and knees.  Skin: Intact, no ulcerations or rash noted.  Psych: Good eye contact, normal affect. Memory intact not anxious or depressed appearing.  CNS: CN 2-12 intact, power,   normal throughout.no focal deficits noted.   Assessment & Plan  Diabetes mellitus type 2 in obese Improved, but still not at goal, add tradgenta one daily and refer to diabetic ed [name] is reminded of the importance of commitment to daily physical activity for 30 minutes or more, as able and the need to limit carbohydrate intake to 30 to 60 grams per meal to help with blood sugar control.   The need to take medication as prescribed, test blood sugar as directed, and to call between visits if there is a concern that blood sugar is uncontrolled is also discussed.   [name] is reminded of the importance of daily foot exam, annual eye examination, and good blood sugar, blood pressure and cholesterol control.  Diabetic Labs Latest Ref Rng 04/07/2016 01/16/2016 10/15/2015 12/05/2014 06/12/2014  HbA1c <5.7 % 7.8(H) 8.8(H) 7.5(H) 7.1(H) 6.6(H)  Microalbumin 0.00 - 1.89 mg/dL - - - - 0.73  Micro/Creat Ratio 0.0 - 30.0 mg/g - - - - 3.2  Chol 125 - 200 mg/dL - 178 - 184 156  HDL >=46 mg/dL - 44(L) - 47 45  Calc LDL <130 mg/dL - 115 - 125(H) 98  Triglycerides <150 mg/dL - 95 - 62 63  Creatinine 0.50 - 1.05 mg/dL 1.37(H) 1.40(H) 1.20(H) 1.29(H) 1.26(H)   BP/Weight 05/05/2016 05/01/2016 01/31/2016 01/16/2016 01/03/2016 10/15/2015 0000000  Systolic BP 123456 90 123456 [id number] [id number]  Diastolic BP 72 60 76 66 62 80 72  Wt. (Lbs) 188.12 189.2 192.3 192.5 189 189.5 192.4  BMI 36.74 36.95 37.56 37.59 36.91 37.01 38.22   Foot/eye exam completion dates 08/15/2015 06/12/2014  Foot Form Completion Done Done         Dyslipidemia, goal LDL below 100 Hyperlipidemia:Low fat diet discussed and encouraged.   Lipid Panel  Lab Results  Component Value Date   CHOL 178 01/16/2016   HDL 44* 01/16/2016   LDLCALC 115 01/16/2016   TRIG 95 01/16/2016   CHOLHDL 4.0 01/16/2016   Start pravachol daily Updated lab needed at/ before next visit.     Essential hypertension Controlled, no change in  medication DASH diet and commitment to daily physical activity for a minimum of 30 minutes discussed and encouraged, as a part of hypertension management. The importance of attaining a healthy weight is also discussed.  BP/Weight 05/05/2016 05/01/2016 01/31/2016 01/16/2016 01/03/2016 10/15/2015 0000000  Systolic BP 123456 90 123456 [id number] [id number] [id number] [id number]  Diastolic BP 72 60 76 66 62 80 72  Wt. (Lbs) 188.12 189.2 192.3 192.5 189 189.5 192.4  BMI 36.74 36.95 37.56 37.59 36.91 37.01 38.22        Immunization refused Refusing indicated vaccine, re educated  Obesity, unspecified Deteriorated. Patient re-educated about  the importance of commitment to a  minimum of 150 minutes of exercise per week.  The importance of healthy food choices with portion control discussed. Encouraged to start a food diary, count calories and to consider  joining a support group. Sample diet sheets offered. Goals set by the patient for the next several months.   Weight /BMI 05/05/2016 05/01/2016 01/31/2016  WEIGHT 188 lb 1.9 oz 189 lb 3.2 oz 192 lb 4.8 oz  HEIGHT 5\' 0"  5\' 0"  5\' 0"   BMI 36.74 kg/m2 36.95 kg/m2 37.56 kg/m2  Unchanged Patient re-educated about  the importance of commitment to a  minimum of 150 minutes of exercise per week.  The importance of healthy food choices with portion control discussed. Encouraged to start a food diary, count calories and to consider  joining a support group. Sample diet sheets offered. Goals set by the patient for the next several months.   Weight /BMI 05/05/2016 05/01/2016 01/31/2016  WEIGHT 188 lb 1.9 oz 189 lb 3.2 oz 192 lb 4.8 oz  HEIGHT 5\' 0"  5\' 0"  5\' 0"   BMI 36.74 kg/m2 36.95 kg/m2 37.56 kg/m2         Pruritus Controlled, no change in medication   Special screening for malignant neoplasms, colon Colonoscopy past due, asymptomatic , will refer for screening  Vitamin D deficiency Updated lab needed at/ before next visit. Continue daily supplement  DM (diabetes  mellitus), type 2, uncontrolled [name] is reminded of the importance of commitment to daily physical activity for 30 minutes or more, as able and the need to limit carbohydrate intake to 30 to 60 grams per meal to help with blood sugar control.   The need to take medication as prescribed, test blood sugar as directed, and to call between visits if there is a concern that blood sugar is uncontrolled is also discussed.   Ms. Kaszuba is reminded of the importance of daily foot exam, annual eye examination, and good blood sugar, blood pressure and cholesterol control. Add tradjenta daily, refer to diabetic educator also  Diabetic Labs Latest Ref Rng 05/05/2016 04/07/2016 01/16/2016 10/15/2015 12/05/2014  HbA1c <5.7 % - 7.8(H) 8.8(H) 7.5(H) 7.1(H)  Microalbumin Not Estab. ug/mL <3.0(H) - - - -  Micro/Creat Ratio 0.0 - 30.0 mg/g creat <2.8 - - - -  Chol 125 -  200 mg/dL - - 178 - 184  HDL >=46 mg/dL - - 44(L) - 47  Calc LDL <130 mg/dL - - 115 - 125(H)  Triglycerides <150 mg/dL - - 95 - 62  Creatinine 0.50 - 1.05 mg/dL - 1.37(H) 1.40(H) 1.20(H) 1.29(H)   BP/Weight 05/05/2016 05/01/2016 01/31/2016 01/16/2016 01/03/2016 10/15/2015 0000000  Systolic BP 123456 90 123456 [id number] [id number]  Diastolic BP 72 60 76 66 62 80 72  Wt. (Lbs) 188.12 189.2 192.3 192.5 189 189.5 192.4  BMI 36.74 36.95 37.56 37.59 36.91 37.01 38.22   Foot/eye exam completion dates 08/15/2015 06/12/2014  Foot Form Completion Done Done             Review of Systems     Objective:   Physical Exam        Assessment & Plan:

## 2016-05-16 ENCOUNTER — Other Ambulatory Visit: Payer: Self-pay

## 2016-05-16 DIAGNOSIS — E669 Obesity, unspecified: Principal | ICD-10-CM

## 2016-05-16 DIAGNOSIS — E1169 Type 2 diabetes mellitus with other specified complication: Secondary | ICD-10-CM

## 2016-05-16 DIAGNOSIS — E785 Hyperlipidemia, unspecified: Secondary | ICD-10-CM

## 2016-05-16 MED ORDER — PRAVASTATIN SODIUM 10 MG PO TABS: 10.0000 mg | ORAL_TABLET | Freq: Every day | ORAL | Status: DC

## 2016-05-16 MED ORDER — LINAGLIPTIN 5 MG PO TABS: 5.0000 mg | ORAL_TABLET | Freq: Every day | ORAL | Status: DC

## 2016-05-21 ENCOUNTER — Telehealth: Payer: Self-pay

## 2016-05-21 NOTE — Telephone Encounter (Signed)
Pt received a letter from DS to be triaged. Please call [phone]

## 2016-05-22 NOTE — Telephone Encounter (Signed)
Tried to call and VM not set up.  

## 2016-05-29 ENCOUNTER — Ambulatory Visit: Payer: Self-pay | Admitting: Physician Assistant

## 2016-05-30 ENCOUNTER — Encounter: Payer: Self-pay | Admitting: Nutrition

## 2016-05-30 ENCOUNTER — Encounter: Payer: Medicare Other | Attending: Family Medicine | Admitting: Nutrition

## 2016-05-30 VITALS — Ht 60.0 in | Wt 188.0 lb

## 2016-05-30 DIAGNOSIS — E119 Type 2 diabetes mellitus without complications: Secondary | ICD-10-CM | POA: Insufficient documentation

## 2016-05-30 DIAGNOSIS — E669 Obesity, unspecified: Secondary | ICD-10-CM

## 2016-05-30 DIAGNOSIS — Z713 Dietary counseling and surveillance: Secondary | ICD-10-CM | POA: Insufficient documentation

## 2016-05-30 DIAGNOSIS — E118 Type 2 diabetes mellitus with unspecified complications: Secondary | ICD-10-CM

## 2016-05-30 DIAGNOSIS — E1165 Type 2 diabetes mellitus with hyperglycemia: Secondary | ICD-10-CM

## 2016-05-30 NOTE — Progress Notes (Signed)
Verio meter testing. Diabetes Self-Management Education  Visit Type: First/Initial  Appt. Start Time:930 Appt. End Time: 1030 am 05/30/2016  [name], identified by name and date of birth, is a 50 y.o. female with a diagnosis of Diabetes: Type 2. She notes she just found out she is diabetic. Doesn't have a meter.A1C has been 8.8 and 7.8%. Eats 2-3 meals per day. Never met with a dietitian before. Has problems with her knees and is on disability for that. Diet is inconsistent to meet her needs. Low in fresh fruits, vegetables and whole grains.   ASSESSMENT  Height 5' (1.524 m), weight 188 lb (85.3 kg). Body mass index is 36.72 kg/m.      Diabetes Self-Management Education - 05/30/16 1209      Individualized Goals (developed by patient)   Nutrition Follow meal plan discussed;General guidelines for healthy choices and portions discussed   Physical Activity Exercise 3-5 times per week   Medications take my medication as prescribed   Monitoring  test my blood glucose as discussed   Reducing Risk examine blood glucose patterns;do foot checks daily   Health Coping ask for help with (comment)  testing and diet if needed     Post-Education Assessment   Patient understands the diabetes disease and treatment process. Needs Review   Patient understands incorporating nutritional management into lifestyle. Needs Review   Patient undertands incorporating physical activity into lifestyle. Needs Review   Patient understands using medications safely. Needs Review   Patient understands monitoring blood glucose, interpreting and using results Needs Review   Patient understands prevention, detection, and treatment of acute complications. Needs Review   Patient understands prevention, detection, and treatment of chronic complications. Needs Review   Patient understands how to develop strategies to address psychosocial issues. Needs Review   Patient understands how to develop strategies to  promote health/change behavior. Needs Review     Outcomes   Expected Outcomes Demonstrated interest in learning. Expect positive outcomes   Future DMSE 4-6 wks   Program Status Not Completed      Individualized Plan for Diabetes Self-Management Training:   Learning Objective:  Patient will have a greater understanding of diabetes self-management. Patient education plan is to attend individual and/or group sessions per assessed needs and concerns.   Plan:   Patient Instructions  [place] 1. Follow My Plate 2. Eat three meals per ay 3. No sweet tea 4. Exercise 15 minutes  A day 5. Don't skip meals 7. Test bloods sugar before breakfast daily. 8. Eat 2-3 carb choices per meal.   Expected Outcomes:  Demonstrated interest in learning. Expect positive outcomes  Education material provided: Living Well with Diabetes, A1C conversion sheet, Meal plan card and My Plate  If problems or questions, patient to contact team via:  Phone and Email  Future DSME appointment: 4-6 wks

## 2016-05-30 NOTE — Patient Instructions (Signed)
[place] 1. Follow My Plate 2. Eat three meals per ay 3. No sweet tea 4. Exercise 15 minutes  A day 5. Don't skip meals 7. Test bloods sugar before breakfast daily. 8. Eat 2-3 carb choices per meal.

## 2016-06-12 ENCOUNTER — Telehealth: Payer: Self-pay

## 2016-06-12 NOTE — Telephone Encounter (Signed)
See separate triage.  

## 2016-06-13 NOTE — Telephone Encounter (Signed)
MOVI PREP SPLIT DOSING,  REGULAR BREAKFAST AND THEN CLEAR LIQUIDS WITH BREAKFAST AFTER 9 AM.  HOLD GLIPIZIDE, JANUVIA, AND TRADJENTA ON AM OF TCS.

## 2016-06-13 NOTE — Telephone Encounter (Signed)
Gastroenterology Pre-Procedure Review  Request Date: 06/12/2016 Requesting Physician: Dr. Moshe Cipro  PATIENT REVIEW QUESTIONS: The patient responded to the following health history questions as indicated:    1. Diabetes Melitis: YES 2. Joint replacements in the past 12 months: no 3. Major health problems in the past 3 months: no 4. Has an artificial valve or MVP: no 5. Has a defibrillator: no 6. Has been advised in past to take antibiotics in advance of a procedure like teeth cleaning: no 7. Family history of colon cancer: no  8. Alcohol Use: no 9. History of sleep apnea: no     MEDICATIONS & ALLERGIES:    Patient reports the following regarding taking any blood thinners:   Plavix? no Aspirin? no Coumadin? no  Patient confirms/reports the following medications:  Current Outpatient Prescriptions  Medication Sig Dispense Refill  . albuterol (PROVENTIL HFA;VENTOLIN HFA) 108 (90 Base) MCG/ACT inhaler Inhale 2 puffs into the lungs every 6 (six) hours as needed for wheezing or shortness of breath. 3 Inhaler 1  . cholecalciferol (VITAMIN D) 400 UNITS TABS tablet Take 800 Units by mouth daily.    Marland Kitchen glipiZIDE (GLUCOTROL) 10 MG tablet Take 1 tablet (10 mg total) by mouth 2 (two) times daily before a meal. 180 tablet 1  . hydrOXYzine (ATARAX/VISTARIL) 50 MG tablet TAKE ONE TABLET BY MOUTH AT BEDTIME FOR ITCHING AND ANXIETY 30 tablet 0  . indomethacin (INDOCIN) 25 MG capsule Take 25 mg by mouth every 8 (eight) hours as needed (for gout). Reported on 01/16/2016    . linagliptin (TRADJENTA) 5 MG TABS tablet Take 1 tablet (5 mg total) by mouth daily. 30 tablet 5  . lisinopril-hydrochlorothiazide (PRINZIDE,ZESTORETIC) 10-12.5 MG tablet Take 1 tablet by mouth daily. 90 tablet 3  . omeprazole (PRILOSEC) 40 MG capsule Take 1 capsule (40 mg total) by mouth 2 (two) times daily. 180 capsule 1  . pravastatin (PRAVACHOL) 10 MG tablet Take 1 tablet (10 mg total) by mouth daily. 30 tablet 5  . sitaGLIPtin  (JANUVIA) 100 MG tablet Take 1 tablet (100 mg total) by mouth daily. 90 tablet 1   No current facility-administered medications for this visit.     Patient confirms/reports the following allergies:  Allergies  Allergen Reactions  . Prednisone Swelling and Other (See Comments)    Made mouth and throat feel numb  . Metformin And Related Diarrhea    No orders of the defined types were placed in this encounter.   AUTHORIZATION INFORMATION Primary Insurance:   ID #:   Group #:  Pre-Cert / Auth required: Pre-Cert / Auth #:   Secondary Insurance:   ID #:   Group #:  Pre-Cert / Auth required:  Pre-Cert / Auth #:   SCHEDULE INFORMATION: Procedure has been scheduled as follows:  Date:   06/23/2016            Time: 11:30 AM  [facility] Short Stay   This Gastroenterology Pre-Precedure Review Form is being routed to the following provider(s): Barney Drain, MD

## 2016-06-16 ENCOUNTER — Telehealth: Payer: Self-pay | Admitting: Family Medicine

## 2016-06-16 ENCOUNTER — Other Ambulatory Visit: Payer: Self-pay

## 2016-06-16 DIAGNOSIS — E669 Obesity, unspecified: Principal | ICD-10-CM

## 2016-06-16 DIAGNOSIS — E785 Hyperlipidemia, unspecified: Secondary | ICD-10-CM

## 2016-06-16 DIAGNOSIS — E1169 Type 2 diabetes mellitus with other specified complication: Secondary | ICD-10-CM

## 2016-06-16 DIAGNOSIS — Z1211 Encounter for screening for malignant neoplasm of colon: Secondary | ICD-10-CM

## 2016-06-16 MED ORDER — PEG-KCL-NACL-NASULF-NA ASC-C 100 G PO SOLR: 1.0000 | ORAL | 0 refills | Status: DC

## 2016-06-16 MED ORDER — GLIPIZIDE 10 MG PO TABS: 10.0000 mg | ORAL_TABLET | Freq: Two times a day (BID) | ORAL | 0 refills | Status: DC

## 2016-06-16 MED ORDER — OMEPRAZOLE 40 MG PO CPDR: 40.0000 mg | DELAYED_RELEASE_CAPSULE | Freq: Two times a day (BID) | ORAL | 0 refills | Status: DC

## 2016-06-16 MED ORDER — PRAVASTATIN SODIUM 10 MG PO TABS: 10.0000 mg | ORAL_TABLET | Freq: Every day | ORAL | 0 refills | Status: DC

## 2016-06-16 MED ORDER — SITAGLIPTIN PHOSPHATE 100 MG PO TABS: 100.0000 mg | ORAL_TABLET | Freq: Every day | ORAL | 0 refills | Status: DC

## 2016-06-16 MED ORDER — LINAGLIPTIN 5 MG PO TABS: 5.0000 mg | ORAL_TABLET | Freq: Every day | ORAL | 0 refills | Status: DC

## 2016-06-16 MED ORDER — LISINOPRIL-HYDROCHLOROTHIAZIDE 10-12.5 MG PO TABS: 1.0000 | ORAL_TABLET | Freq: Every day | ORAL | 0 refills | Status: DC

## 2016-06-16 NOTE — Telephone Encounter (Signed)
Movie prep not covered. Pt will come by and pick up a sample and also pick up a copy of instructions.

## 2016-06-16 NOTE — Telephone Encounter (Signed)
Rx sent to the pharmacy and instructions mailed to pt.  

## 2016-06-16 NOTE — Telephone Encounter (Signed)
Kelli Spencer is stating that all her medications need to be sent to [facility] in [place] due to insurance purposes. She needs them all refilled at this time. Please advise?

## 2016-06-16 NOTE — Telephone Encounter (Signed)
Meds refilled to [facility]

## 2016-06-18 ENCOUNTER — Telehealth: Payer: Self-pay | Admitting: Family Medicine

## 2016-06-18 NOTE — Telephone Encounter (Signed)
Kelli Spencer is asking for a Rx for Lancets and test strips for her machine which is One [employer] Flex sent to [facility]

## 2016-06-19 MED ORDER — ONETOUCH DELICA LANCETS 33G MISC: 0 refills | Status: DC

## 2016-06-19 MED ORDER — GLUCOSE BLOOD VI STRP: ORAL_STRIP | 1 refills | Status: DC

## 2016-06-19 NOTE — Telephone Encounter (Signed)
Requested prescriptions sent to pharmacy.

## 2016-06-23 ENCOUNTER — Ambulatory Visit (HOSPITAL_COMMUNITY)
Admission: RE | Admit: 2016-06-23 | Discharge: 2016-06-23 | Disposition: A | Discharge status: Home Health | Payer: Medicare Other | Source: Ambulatory Visit | Attending: Gastroenterology | Admitting: Gastroenterology

## 2016-06-23 ENCOUNTER — Encounter (HOSPITAL_COMMUNITY): Payer: Self-pay | Admitting: *Deleted

## 2016-06-23 ENCOUNTER — Encounter (HOSPITAL_COMMUNITY)
Admission: RE | Disposition: A | Discharge status: Home Health | Payer: Self-pay | Source: Ambulatory Visit | Attending: Gastroenterology

## 2016-06-23 DIAGNOSIS — E669 Obesity, unspecified: Secondary | ICD-10-CM | POA: Diagnosis not present

## 2016-06-23 DIAGNOSIS — Z841 Family history of disorders of kidney and ureter: Secondary | ICD-10-CM | POA: Diagnosis not present

## 2016-06-23 DIAGNOSIS — E785 Hyperlipidemia, unspecified: Secondary | ICD-10-CM | POA: Diagnosis not present

## 2016-06-23 DIAGNOSIS — J45909 Unspecified asthma, uncomplicated: Secondary | ICD-10-CM | POA: Diagnosis not present

## 2016-06-23 DIAGNOSIS — G43909 Migraine, unspecified, not intractable, without status migrainosus: Secondary | ICD-10-CM | POA: Insufficient documentation

## 2016-06-23 DIAGNOSIS — Z6836 Body mass index (BMI) 36.0-36.9, adult: Secondary | ICD-10-CM | POA: Insufficient documentation

## 2016-06-23 DIAGNOSIS — Z79899 Other long term (current) drug therapy: Secondary | ICD-10-CM | POA: Diagnosis not present

## 2016-06-23 DIAGNOSIS — K648 Other hemorrhoids: Secondary | ICD-10-CM | POA: Diagnosis not present

## 2016-06-23 DIAGNOSIS — Q438 Other specified congenital malformations of intestine: Secondary | ICD-10-CM | POA: Diagnosis not present

## 2016-06-23 DIAGNOSIS — Z90710 Acquired absence of both cervix and uterus: Secondary | ICD-10-CM | POA: Insufficient documentation

## 2016-06-23 DIAGNOSIS — I10 Essential (primary) hypertension: Secondary | ICD-10-CM | POA: Diagnosis not present

## 2016-06-23 DIAGNOSIS — Z8249 Family history of ischemic heart disease and other diseases of the circulatory system: Secondary | ICD-10-CM | POA: Insufficient documentation

## 2016-06-23 DIAGNOSIS — Z1211 Encounter for screening for malignant neoplasm of colon: Secondary | ICD-10-CM | POA: Diagnosis not present

## 2016-06-23 DIAGNOSIS — Z7984 Long term (current) use of oral hypoglycemic drugs: Secondary | ICD-10-CM | POA: Insufficient documentation

## 2016-06-23 DIAGNOSIS — E119 Type 2 diabetes mellitus without complications: Secondary | ICD-10-CM | POA: Insufficient documentation

## 2016-06-23 DIAGNOSIS — M1990 Unspecified osteoarthritis, unspecified site: Secondary | ICD-10-CM | POA: Insufficient documentation

## 2016-06-23 DIAGNOSIS — Z5309 Procedure and treatment not carried out because of other contraindication: Secondary | ICD-10-CM | POA: Insufficient documentation

## 2016-06-23 DIAGNOSIS — Z888 Allergy status to other drugs, medicaments and biological substances status: Secondary | ICD-10-CM | POA: Insufficient documentation

## 2016-06-23 DIAGNOSIS — Z833 Family history of diabetes mellitus: Secondary | ICD-10-CM | POA: Insufficient documentation

## 2016-06-23 DIAGNOSIS — Z823 Family history of stroke: Secondary | ICD-10-CM | POA: Insufficient documentation

## 2016-06-23 HISTORY — PX: COLONOSCOPY: SHX5424

## 2016-06-23 LAB — GLUCOSE, CAPILLARY: Glucose-Capillary: 120 mg/dL — ABNORMAL HIGH (ref 65–99)

## 2016-06-23 SURGERY — COLONOSCOPY
Anesthesia: Moderate Sedation

## 2016-06-23 MED ORDER — MIDAZOLAM HCL 5 MG/5ML IJ SOLN
INTRAMUSCULAR | Status: AC
  Filled 2016-06-23: qty 10

## 2016-06-23 MED ORDER — MEPERIDINE HCL 100 MG/ML IJ SOLN
INTRAMUSCULAR | Status: AC
  Filled 2016-06-23: qty 2

## 2016-06-23 MED ORDER — SODIUM CHLORIDE 0.9 % IV SOLN
INTRAVENOUS | Status: DC
  Administered 2016-06-23: 11:00:00 via INTRAVENOUS

## 2016-06-23 MED ORDER — MEPERIDINE HCL 100 MG/ML IJ SOLN
INTRAMUSCULAR | Status: DC | PRN
  Administered 2016-06-23 (×3): 25 mg

## 2016-06-23 MED ORDER — MIDAZOLAM HCL 5 MG/5ML IJ SOLN
INTRAMUSCULAR | Status: DC | PRN
  Administered 2016-06-23 (×3): 2 mg via INTRAVENOUS

## 2016-06-23 NOTE — H&P (Signed)
Primary Care Physician:  Tula Nakayama, MD Primary Gastroenterologist:  Dr. Oneida Alar  Pre-Procedure History & Physical: HPI:  Kelli Spencer is a 50 y.o. female here for [place].  Past Medical History:  Diagnosis Date  . Arthritis   . Asthma   . Diabetes mellitus without complication ([place])   . Diastolic hypertension   . Hyperlipidemia   . Hypertension   . Migraines   . Obesity     Past Surgical History:  Procedure Laterality Date  . ABDOMINAL HYSTERECTOMY    . KNEE ARTHROSCOPY WITH MEDIAL MENISECTOMY Left 02/09/2014   Procedure: KNEE ARTHROSCOPY WITH MEDIAL MENISECTOMY;  Surgeon: Carole Civil, MD;  Location: [facility];  Service: Orthopedics;  Laterality: Left;  . PARTIAL HYSTERECTOMY      Prior to Admission medications   Medication Sig Start Date End Date Taking? Authorizing Provider  albuterol (PROVENTIL HFA;VENTOLIN HFA) 108 (90 Base) MCG/ACT inhaler Inhale 2 puffs into the lungs every 6 (six) hours as needed for wheezing or shortness of breath. 02/18/16  Yes Soyla Dryer, PA-C  cholecalciferol (VITAMIN D) 400 UNITS TABS tablet Take 800 Units by mouth daily.   Yes Historical Provider, MD  glipiZIDE (GLUCOTROL) 10 MG tablet Take 1 tablet (10 mg total) by mouth 2 (two) times daily before a meal. 06/16/16  Yes Fayrene Helper, MD  hydrOXYzine (ATARAX/VISTARIL) 50 MG tablet TAKE ONE TABLET BY MOUTH AT BEDTIME FOR ITCHING AND ANXIETY 03/16/15  Yes Fayrene Helper, MD  linagliptin (TRADJENTA) 5 MG TABS tablet Take 1 tablet (5 mg total) by mouth daily. 06/16/16 11/16/16 Yes Fayrene Helper, MD  lisinopril-hydrochlorothiazide (PRINZIDE,ZESTORETIC) 10-12.5 MG tablet Take 1 tablet by mouth daily. 06/16/16  Yes Fayrene Helper, MD  omeprazole (PRILOSEC) 40 MG capsule Take 1 capsule (40 mg total) by mouth 2 (two) times daily. 06/16/16  Yes Fayrene Helper, MD  peg 3350 powder (MOVIPREP) 100 g SOLR Take 1 kit (200 g total) by mouth as directed. 06/16/16  Yes  Danie Binder, MD  pravastatin (PRAVACHOL) 10 MG tablet Take 1 tablet (10 mg total) by mouth daily. 06/16/16 11/16/16 Yes Fayrene Helper, MD  sitaGLIPtin (JANUVIA) 100 MG tablet Take 1 tablet (100 mg total) by mouth daily. 06/16/16  Yes Fayrene Helper, MD  glucose blood [facility] VERIO) test strip Once daily testing 06/19/16   Fayrene Helper, MD  indomethacin (INDOCIN) 25 MG capsule Take 25 mg by mouth every 8 (eight) hours as needed (for gout). Reported on 01/16/2016    Historical Provider, MD  Jonetta Speak LANCETS 76B MISC For use with [employer]; Once daily testing 06/19/16   Fayrene Helper, MD    Allergies as of 06/16/2016 - Review Complete 06/12/2016  Allergen Reaction Noted  . Prednisone Swelling and Other (See Comments) 06/09/2013  . Metformin and related Diarrhea 05/05/2016    Family History  Problem Relation Age of Onset  . Kidney failure Mother   . Diabetes Mother   . Hypertension Mother   . Stroke Mother   . Diabetes Father   . Heart disease Father     Social History   Social History  . Marital status: Divorced    Spouse name: N/A  . Number of children: 3  . Years of education: N/A   Occupational History  . employed     Social History Main Topics  . Smoking status: Never Smoker  . Smokeless tobacco: Never Used  . Alcohol use No  . Drug  use: No  . Sexual activity: Not on file   Other Topics Concern  . Not on file   Social History Narrative  . No narrative on file    Review of Systems: See HPI, otherwise negative ROS   Physical Exam: BP (!) 110/51   Pulse 86   Temp 98.7 F (37.1 C) (Oral)   Resp 16   Ht 5' (1.524 m)   Wt 188 lb (85.3 kg)   SpO2 97%   BMI 36.72 kg/m  General:   Alert,  pleasant and cooperative in NAD Head:  Normocephalic and atraumatic. Neck:  Supple; Lungs:  Clear throughout to auscultation.    Heart:  Regular rate and rhythm. Abdomen:  Soft, nontender and nondistended. Normal bowel sounds, without  guarding, and without rebound.   Neurologic:  Alert and  oriented x4;  grossly normal neurologically.  Impression/Plan:     SCREENING  Plan:  1. TCS TODAY

## 2016-06-23 NOTE — Op Note (Signed)
[facility] Patient Name: Kelli Spencer Procedure Date: 06/23/2016 12:13 PM MRN: [id number] Date of Birth: May 08, 1966 Attending MD: Barney Drain , MD CSN: [id number] Age: 50 Admit Type: Outpatient Procedure:                Colonoscopy, SCREENING-INCOMPLETE Indications:              Screening for colorectal malignant neoplasm. PT ATE                            [place] AND [place] [facility] YESTERDAY. Providers:                Barney Drain, MD, Janeece Riggers, RN, Purcell Nails                            Tina Griffiths, Technician Referring MD:             Norwood Levo. Simpson MD, MD Medicines:                Meperidine 75 mg IV, Midazolam 6 mg IV Complications:            No immediate complications. Estimated Blood Loss:     Estimated blood loss was minimal. Procedure:                Pre-Anesthesia Assessment:                           - Prior to the procedure, a History and Physical                            was performed, and patient medications and                            allergies were reviewed. The patient's tolerance of                            previous anesthesia was also reviewed. The risks                            and benefits of the procedure and the sedation                            options and risks were discussed with the patient.                            All questions were answered, and informed consent                            was obtained. Prior Anticoagulants: The patient has                            taken no previous anticoagulant or antiplatelet                            agents. ASA Grade Assessment: II - A patient with  mild systemic disease. After reviewing the risks                            and benefits, the patient was deemed in                            satisfactory condition to undergo the procedure.                           After obtaining informed consent, the colonoscope                            was passed under direct  vision. Throughout the                            procedure, the patient's blood pressure, pulse, and                            oxygen saturations were monitored continuously. The                            [id number] scope was introduced through                            the anus and advanced to the the transverse colon.                            The rectum was photographed. The colonoscopy was                            technically difficult and complex due to inadequate                            bowel prep and a tortuous colon. The patient                            tolerated the procedure well. The quality of the                            bowel preparation was not adequate to identify                            polyps 6 mm and larger in size. Scope In: 12:32:50 PM Scope Out: [id number] PM Total Procedure Duration: 0 hours 13 minutes 16 seconds  Findings:      The recto-sigmoid colon, sigmoid colon and descending colon were       moderately redundant. AND COLOWRAP      The exam was otherwise without abnormality.      Non-bleeding internal hemorrhoids were found. The hemorrhoids were       moderate. Impression:               - Preparation of the colon was inadequate.                           -  Redundant LEFT colon.                           - Non-bleeding internal hemorrhoids. Moderate Sedation:      Moderate (conscious) sedation was administered by the endoscopy nurse       and supervised by the endoscopist. The following parameters were       monitored: oxygen saturation, heart rate, blood pressure, and response       to care. Total physician intraservice time was 34 minutes. Recommendation:           - High fiber diet.                           - Continue present medications.                           - Repeat colonoscopy in 5 years for surveillance.                           - Patient has a contact number available for                            emergencies. The  signs and symptoms of potential                            delayed complications were discussed with the                            patient. Return to normal activities tomorrow.                            Written discharge instructions were provided to the                            patient. Procedure Code(s):        --- Professional ---                           [phone], 53, Colonoscopy, flexible; diagnostic,                            including collection of specimen(s) by brushing or                            washing, when performed (separate procedure)                           99152, Moderate sedation services provided by the                            same physician or other qualified health care                            professional performing the diagnostic or  therapeutic service that the sedation supports,                            requiring the presence of an independent trained                            observer to assist in the monitoring of the                            patient's level of consciousness and physiological                            status; initial 15 minutes of intraservice time,                            patient age 22 years or older                           [phone], Moderate sedation services; each additional                            15 minutes intraservice time Diagnosis Code(s):        --- Professional ---                           Z12.11, Encounter for screening for malignant                            neoplasm of colon                           K64.8, Other hemorrhoids                           Q43.8, Other specified congenital malformations of                            intestine CPT copyright 2016 American Medical Association. All rights reserved. The codes documented in this report are preliminary and upon coder review may  be revised to meet current compliance requirements. Barney Drain, MD Barney Drain,  MD 06/23/2016 1:02:08 PM This report has been signed electronically. Number of Addenda: 0

## 2016-06-23 NOTE — Discharge Instructions (Signed)
You have internal hemorrhoids. YOU DID NOT HAVE ANY POLYPS. Your prep was not good. YOU NEED TO COME BACK IN 5 YEARS INSTEAD OF 10. I WAS ABLE TO RINSE  TO GE A GOOD VIEW OF YOUR LEFT COLON.    DRINK WATER TO KEEP URINE LIGHT YELLOW.  CONTINUE YOUR WEIGHT LOSS EFFORTS. LOSE TEN POUNDS.  FOLLOW A HIGH FIBER DIET. AVOID ITEMS THAT CAUSE BLOATING. SEE INFO BELOW.  USE PREPARATION H 2 TO 4 TIMES A DAY IF NEEDED FOR RECTAL PRESSURE/PAIN/BLEEDING.  Next colonoscopy in 5 years WITH ONLY CLEAR LIQUIDS ON THE DAY BEFORE BECAUSE THE PREP IN YOUR RIGHT COLON WAS NOT GOOD.   Colonoscopy Care After Read the instructions outlined below and refer to this sheet in the next week. These discharge instructions provide you with general information on caring for yourself after you leave the hospital. While your treatment has been planned according to the most current medical practices available, unavoidable complications occasionally occur. If you have any problems or questions after discharge, call DR. Keatyn Jawad, [phone].  ACTIVITY  You may resume your regular activity, but move at a slower pace for the next 24 hours.   Take frequent rest periods for the next 24 hours.   Walking will help get rid of the air and reduce the bloated feeling in your belly (abdomen).   No driving for 24 hours (because of the medicine (anesthesia) used during the test).   You may shower.   Do not sign any important legal documents or operate any machinery for 24 hours (because of the anesthesia used during the test).    NUTRITION  Drink plenty of fluids.   You may resume your normal diet as instructed by your doctor.   Begin with a light meal and progress to your normal diet. Heavy or fried foods are harder to digest and may make you feel sick to your stomach (nauseated).   Avoid alcoholic beverages for 24 hours or as instructed.    MEDICATIONS  You may resume your normal medications.   WHAT YOU CAN EXPECT  TODAY  Some feelings of bloating in the abdomen.   Passage of more gas than usual.   Spotting of blood in your stool or on the toilet paper  .  IF YOU HAD POLYPS REMOVED DURING THE COLONOSCOPY:  Eat a soft diet IF YOU HAVE NAUSEA, BLOATING, ABDOMINAL PAIN, OR VOMITING.    FINDING OUT THE RESULTS OF YOUR TEST Not all test results are available during your visit. DR. Oneida Alar WILL CALL YOU WITHIN 7 DAYS OF YOUR PROCEDUE WITH YOUR RESULTS. Do not assume everything is normal if you have not heard from DR. Tessy Pawelski IN ONE WEEK, CALL HER OFFICE AT [phone].  SEEK IMMEDIATE MEDICAL ATTENTION AND CALL THE OFFICE: [phone] IF:  You have more than a spotting of blood in your stool.   Your belly is swollen (abdominal distention).   You are nauseated or vomiting.   You have a temperature over 101F.   You have abdominal pain or discomfort that is severe or gets worse throughout the day.  High-Fiber Diet A high-fiber diet changes your normal diet to include more whole grains, legumes, fruits, and vegetables. Changes in the diet involve replacing refined carbohydrates with unrefined foods. The calorie level of the diet is essentially unchanged. The Dietary Reference Intake (recommended amount) for adult males is 38 grams per day. For adult females, it is 25 grams per day. Pregnant and lactating women should consume 28 grams  of fiber per day. Fiber is the intact part of a plant that is not broken down during digestion. Functional fiber is fiber that has been isolated from the plant to provide a beneficial effect in the body. PURPOSE  Increase stool bulk.   Ease and regulate bowel movements.   Lower cholesterol.   REDUCE RISK OF COLON CANCER  INDICATIONS THAT YOU NEED MORE FIBER  Constipation and hemorrhoids.   Uncomplicated diverticulosis (intestine condition) and irritable bowel syndrome.   Weight management.   As a protective measure against hardening of the arteries  (atherosclerosis), diabetes, and cancer.   GUIDELINES FOR INCREASING FIBER IN THE DIET  Start adding fiber to the diet slowly. A gradual increase of about 5 more grams (2 slices of whole-wheat bread, 2 servings of most fruits or vegetables, or 1 bowl of high-fiber cereal) per day is best. Too rapid an increase in fiber may result in constipation, flatulence, and bloating.   Drink enough water and fluids to keep your urine clear or pale yellow. Water, juice, or caffeine-free drinks are recommended. Not drinking enough fluid may cause constipation.   Eat a variety of high-fiber foods rather than one type of fiber.   Try to increase your intake of fiber through using high-fiber foods rather than fiber pills or supplements that contain small amounts of fiber.   The goal is to change the types of food eaten. Do not supplement your present diet with high-fiber foods, but replace foods in your present diet.   INCLUDE A VARIETY OF FIBER SOURCES  Replace refined and processed grains with whole grains, canned fruits with fresh fruits, and incorporate other fiber sources. White rice, white breads, and most bakery goods contain little or no fiber.   Brown whole-grain rice, buckwheat oats, and many fruits and vegetables are all good sources of fiber. These include: broccoli, Brussels sprouts, cabbage, cauliflower, beets, sweet potatoes, white potatoes (skin on), carrots, tomatoes, eggplant, squash, berries, fresh fruits, and dried fruits.   Cereals appear to be the richest source of fiber. Cereal fiber is found in whole grains and bran. Bran is the fiber-rich outer coat of cereal grain, which is largely removed in refining. In whole-grain cereals, the bran remains. In breakfast cereals, the largest amount of fiber is found in those with "bran" in their names. The fiber content is sometimes indicated on the label.   You may need to include additional fruits and vegetables each day.   In baking, for 1 cup  white flour, you may use the following substitutions:   1 cup whole-wheat flour minus 2 tablespoons.   1/2 cup white flour plus 1/2 cup whole-wheat flour.   Hemorrhoids Hemorrhoids are dilated (enlarged) veins around the rectum. Sometimes clots will form in the veins. This makes them swollen and painful. These are called thrombosed hemorrhoids. Causes of hemorrhoids include:  Constipation.   Straining to have a bowel movement.   HEAVY LIFTING  HOME CARE INSTRUCTIONS  Eat a well balanced diet and drink 6 to 8 glasses of water every day to avoid constipation. You may also use a bulk laxative.   Avoid straining to have bowel movements.   Keep anal area dry and clean.   Do not use a donut shaped pillow or sit on the toilet for long periods. This increases blood pooling and pain.   Move your bowels when your body has the urge; this will require less straining and will decrease pain and pressure.

## 2016-06-26 ENCOUNTER — Encounter (HOSPITAL_COMMUNITY): Payer: Self-pay | Admitting: Gastroenterology

## 2016-07-02 ENCOUNTER — Telehealth: Payer: Self-pay | Admitting: Family Medicine

## 2016-07-02 DIAGNOSIS — E1165 Type 2 diabetes mellitus with hyperglycemia: Secondary | ICD-10-CM

## 2016-07-02 DIAGNOSIS — I10 Essential (primary) hypertension: Secondary | ICD-10-CM

## 2016-07-02 DIAGNOSIS — E118 Type 2 diabetes mellitus with unspecified complications: Principal | ICD-10-CM

## 2016-07-02 DIAGNOSIS — E785 Hyperlipidemia, unspecified: Secondary | ICD-10-CM

## 2016-07-02 NOTE — Telephone Encounter (Signed)
Kelli Spencer is asking if Dr. Moshe Cipro would call her in something for her cough/cold, please advise?

## 2016-07-04 ENCOUNTER — Other Ambulatory Visit: Payer: Self-pay | Admitting: Family Medicine

## 2016-07-04 ENCOUNTER — Other Ambulatory Visit: Payer: Self-pay

## 2016-07-04 MED ORDER — AMLODIPINE BESYLATE 2.5 MG PO TABS: 2.5000 mg | ORAL_TABLET | Freq: Every day | ORAL | 3 refills | Status: DC

## 2016-07-04 NOTE — Telephone Encounter (Signed)
Advise her to d/cancer the med, I have entered aCE allergy Amlodipine entered , 2.5 mg pls send after you explain to her  Needs an mD visit early October, re eval blood pressure, also needs for that visit fasting lipid, cmp and eGFR, hBA1C draw 5 days before, pls explain , order , needs appt also, thanks

## 2016-07-04 NOTE — Telephone Encounter (Signed)
States that she has had dry cough since change in Lisinopril

## 2016-07-04 NOTE — Telephone Encounter (Signed)
Patient aware and medication sent to pharmacy.  Repeat labs and appointment given to patient.  Ordered mailed to home address.

## 2016-07-21 DIAGNOSIS — E1165 Type 2 diabetes mellitus with hyperglycemia: Secondary | ICD-10-CM | POA: Diagnosis not present

## 2016-07-21 DIAGNOSIS — E118 Type 2 diabetes mellitus with unspecified complications: Secondary | ICD-10-CM | POA: Diagnosis not present

## 2016-07-21 DIAGNOSIS — E785 Hyperlipidemia, unspecified: Secondary | ICD-10-CM | POA: Diagnosis not present

## 2016-07-21 DIAGNOSIS — I10 Essential (primary) hypertension: Secondary | ICD-10-CM | POA: Diagnosis not present

## 2016-07-22 LAB — COMPLETE METABOLIC PANEL WITH GFR
ALBUMIN: 4 g/dL (ref 3.6–5.1)
ALK PHOS: 112 U/L (ref 33–130)
ALT: 17 U/L (ref 6–29)
AST: 22 U/L (ref 10–35)
BILIRUBIN TOTAL: 0.3 mg/dL (ref 0.2–1.2)
BUN: 22 mg/dL (ref 7–25)
CALCIUM: 9.6 mg/dL (ref 8.6–10.4)
CO2: 26 mmol/L (ref 20–31)
Chloride: 103 mmol/L (ref 98–110)
Creat: 1.11 mg/dL — ABNORMAL HIGH (ref 0.50–1.05)
GFR, EST NON AFRICAN AMERICAN: 58 mL/min — AB (ref >=60)
GFR, Est African American: 66 mL/min (ref >=60)
GLUCOSE: 130 mg/dL — AB (ref 65–99)
Potassium: 4.7 mmol/L (ref 3.5–5.3)
SODIUM: 138 mmol/L (ref 135–146)
TOTAL PROTEIN: 7.3 g/dL (ref 6.1–8.1)

## 2016-07-22 LAB — LIPID PANEL
CHOLESTEROL: 121 mg/dL — AB (ref 125–200)
HDL: 42 mg/dL — ABNORMAL LOW (ref >=46)
LDL CALC: 69 mg/dL (ref <130)
Total CHOL/HDL Ratio: 2.9 Ratio (ref <=5.0)
Triglycerides: 50 mg/dL (ref <150)
VLDL: 10 mg/dL (ref <30)

## 2016-07-22 LAB — HEMOGLOBIN A1C
Hgb A1c MFr Bld: 6.4 % — ABNORMAL HIGH (ref <5.7)
Mean Plasma Glucose: 137 mg/dL

## 2016-08-08 ENCOUNTER — Ambulatory Visit: Payer: Medicare Other | Admitting: Family Medicine

## 2016-08-21 ENCOUNTER — Encounter: Payer: Self-pay | Admitting: Family Medicine

## 2016-08-21 ENCOUNTER — Ambulatory Visit (INDEPENDENT_AMBULATORY_CARE_PROVIDER_SITE_OTHER): Payer: Medicare Other | Admitting: Family Medicine

## 2016-08-21 VITALS — BP 118/80 | HR 80 | Ht 60.0 in | Wt 196.0 lb

## 2016-08-21 DIAGNOSIS — E1169 Type 2 diabetes mellitus with other specified complication: Secondary | ICD-10-CM | POA: Diagnosis not present

## 2016-08-21 DIAGNOSIS — M25561 Pain in right knee: Secondary | ICD-10-CM

## 2016-08-21 DIAGNOSIS — E785 Hyperlipidemia, unspecified: Secondary | ICD-10-CM | POA: Diagnosis not present

## 2016-08-21 DIAGNOSIS — E6609 Other obesity due to excess calories: Secondary | ICD-10-CM

## 2016-08-21 DIAGNOSIS — I10 Essential (primary) hypertension: Secondary | ICD-10-CM

## 2016-08-21 DIAGNOSIS — E559 Vitamin D deficiency, unspecified: Secondary | ICD-10-CM

## 2016-08-21 DIAGNOSIS — M25562 Pain in left knee: Secondary | ICD-10-CM

## 2016-08-21 DIAGNOSIS — E669 Obesity, unspecified: Secondary | ICD-10-CM

## 2016-08-21 DIAGNOSIS — Z2821 Immunization not carried out because of patient refusal: Secondary | ICD-10-CM

## 2016-08-21 DIAGNOSIS — Z114 Encounter for screening for human immunodeficiency virus [HIV]: Secondary | ICD-10-CM

## 2016-08-21 DIAGNOSIS — Z1159 Encounter for screening for other viral diseases: Secondary | ICD-10-CM

## 2016-08-21 DIAGNOSIS — Z6830 Body mass index (BMI) 30.0-30.9, adult: Secondary | ICD-10-CM

## 2016-08-21 DIAGNOSIS — G8929 Other chronic pain: Secondary | ICD-10-CM

## 2016-08-21 MED ORDER — GLIPIZIDE 10 MG PO TABS: ORAL_TABLET | ORAL | 3 refills | Status: DC

## 2016-08-21 NOTE — Assessment & Plan Note (Signed)
Deteriorated. Patient re-educated about  the importance of commitment to a  minimum of 150 minutes of exercise per week.  The importance of healthy food choices with portion control discussed. Encouraged to start a food diary, count calories and to consider  joining a support group. Sample diet sheets offered. Goals set by the patient for the next several months.   Weight /BMI 08/21/2016 06/23/2016 05/30/2016  WEIGHT 196 lb 188 lb 188 lb  HEIGHT 5\' 0"  5\' 0"  5\' 0"   BMI 38.28 kg/m2 36.72 kg/m2 36.72 kg/m2

## 2016-08-21 NOTE — Assessment & Plan Note (Signed)
Re educated, pt unwilling to accept vaccines

## 2016-08-21 NOTE — Assessment & Plan Note (Signed)
Controlled, no change in medication Hyperlipidemia:Low fat diet discussed and encouraged.   Lipid Panel  Lab Results  Component Value Date   CHOL 121 (L) 07/21/2016   HDL 42 (L) 07/21/2016   LDLCALC 69 07/21/2016   TRIG 50 07/21/2016   CHOLHDL 2.9 07/21/2016   Needs to increase exercise, limited by knee pathology, water aerobics an option

## 2016-08-21 NOTE — Assessment & Plan Note (Signed)
Controlled, no change in medication DASH diet and commitment to daily physical activity for a minimum of 30 minutes discussed and encouraged, as a part of hypertension management. The importance of attaining a healthy weight is also discussed.  BP/Weight 08/21/2016 06/23/2016 05/30/2016 05/05/2016 05/01/2016 01/31/2016 [id number]  Systolic BP 123456 93 - 123456 90 123456 [id number]  Diastolic BP 80 50 - 72 60 76 66  Wt. (Lbs) 196 188 188 188.12 189.2 192.3 192.5  BMI 38.28 36.72 36.72 36.74 36.95 37.56 37.59

## 2016-08-21 NOTE — Addendum Note (Signed)
Addended by: Eual Fines on: 08/21/2016 05:03 PM   Modules accepted: Orders

## 2016-08-21 NOTE — Progress Notes (Signed)
Kelli Spencer     MRN: [id number]      DOB: [birth year]   HPI Kelli Spencer is here for follow up and re-evaluation of chronic medical conditions, medication management and review of any available recent lab and radiology data.  Preventive health is updated, specifically  Cancer screening and Immunization.   Refuses all vaccines, re educated The PT denies any adverse reactions to current medications since the last visit.  C/o worsening bilateral knee and ankle pain, with knee instability  Denies polyuria, polydipsia, blurred vision , or hypoglycemic episodes.  ROS Denies recent fever or chills. Denies sinus pressure, nasal congestion, ear pain or sore throat. Denies chest congestion, productive cough or wheezing. Denies chest pains, palpitations and leg swelling Denies abdominal pain, nausea, vomiting,diarrhea or constipation.   Denies dysuria, frequency, hesitancy or incontinence.  Denies headaches, seizures, numbness, or tingling. Denies depression, anxiety or insomnia. Denies skin break down or rash.   PE  BP 118/80   Pulse 80   Ht 5' (1.524 m)   Wt 196 lb (88.9 kg)   SpO2 96%   BMI 38.28 kg/m   Patient alert and oriented and in no cardiopulmonary distress.  HEENT: No facial asymmetry, EOMI,   oropharynx pink and moist.  Neck supple no JVD, no mass.  Chest: Clear to auscultation bilaterally.  CVS: S1, S2 no murmurs, no S3.Regular rate.  ABD: Soft non tender.   Ext: No edema   MS: adequate ROM shoulder and hips and spine, decreased in knees with are deformed with crepitus  Skin: Intact, no ulcerations or rash noted.  Psych: Good eye contact, normal affect. Memory intact not anxious or depressed appearing.  CNS: CN 2-12 intact, power,  normal throughout.no focal deficits noted.   Assessment & Plan  Essential hypertension Controlled, no change in medication DASH diet and commitment to daily physical activity for a minimum of 30 minutes discussed and  encouraged, as a part of hypertension management. The importance of attaining a healthy weight is also discussed.  BP/Weight 08/21/2016 06/23/2016 05/30/2016 05/05/2016 05/01/2016 01/31/2016 123XX123  Systolic BP 123456 93 - 123456 90 123456 [id number]  Diastolic BP 80 50 - 72 60 76 66  Wt. (Lbs) 196 188 188 188.12 189.2 192.3 192.5  BMI 38.28 36.72 36.72 36.74 36.95 37.56 37.59       Diabetes mellitus type 2 in obese Improved, reduce glipizide dose Kelli Spencer is reminded of the importance of commitment to daily physical activity for 30 minutes or more, as able and the need to limit carbohydrate intake to 30 to 60 grams per meal to help with blood sugar control.   The need to take medication as prescribed, test blood sugar as directed, and to call between visits if there is a concern that blood sugar is uncontrolled is also discussed.   Kelli Spencer is reminded of the importance of daily foot exam, annual eye examination, and good blood sugar, blood pressure and cholesterol control.  Diabetic Labs Latest Ref Rng & Units 07/21/2016 05/05/2016 04/07/2016 01/16/2016 10/15/2015  HbA1c <5.7 % 6.4(H) - 7.8(H) 8.8(H) 7.5(H)  Microalbumin Not Estab. ug/mL - <3.0(H) - - -  Micro/Creat Ratio 0.0 - 30.0 mg/g creat - <2.8 - - -  Chol 125 - 200 mg/dL 121(L) - - 178 -  HDL >=46 mg/dL 42(L) - - 44(L) -  Calc LDL <130 mg/dL 69 - - 115 -  Triglycerides <150 mg/dL 50 - - 95 -  Creatinine 0.50 - 1.05 mg/dL 1.11(H) -  1.37(H) 1.40(H) 1.20(H)   BP/Weight 08/21/2016 06/23/2016 05/30/2016 05/05/2016 05/01/2016 01/31/2016 [id number]  Systolic BP 123456 93 - 123456 90 123456 [id number]  Diastolic BP 80 50 - 72 60 76 66  Wt. (Lbs) 196 188 188 188.12 189.2 192.3 192.5  BMI 38.28 36.72 36.72 36.74 36.95 37.56 37.59   Foot/eye exam completion dates 08/21/2016 08/15/2015  Foot Form Completion Done Done        Dyslipidemia, goal LDL below 100 Controlled, no change in medication Hyperlipidemia:Low fat diet discussed and encouraged.   Lipid Panel  Lab  Results  Component Value Date   CHOL 121 (L) 07/21/2016   HDL 42 (L) 07/21/2016   LDLCALC 69 07/21/2016   TRIG 50 07/21/2016   CHOLHDL 2.9 07/21/2016   Needs to increase exercise, limited by knee pathology, water aerobics an option    Chronic pain of both knees Reports disabling pain rated at an 8, with instability, awakens patient, previous imaging shows pathology, refer to ortho for re eval  Obesity Deteriorated. Patient re-educated about  the importance of commitment to a  minimum of 150 minutes of exercise per week.  The importance of healthy food choices with portion control discussed. Encouraged to start a food diary, count calories and to consider  joining a support group. Sample diet sheets offered. Goals set by the patient for the next several months.   Weight /BMI 08/21/2016 06/23/2016 05/30/2016  WEIGHT 196 lb 188 lb 188 lb  HEIGHT 5\' 0"  5\' 0"  5\' 0"   BMI 38.28 kg/m2 36.72 kg/m2 36.72 kg/m2      Immunization refused Re educated, pt unwilling to accept vaccines

## 2016-08-21 NOTE — Patient Instructions (Addendum)
Welcome to medicare in Dec as before, reconsider vaccines you NEEd them  F/U with pelvic exam early March  HBa1C, chem 7 and EGFR, Hep C, HIV, vit D, cBC and uric acid for March visit  You are referred to Dr Cline Cools labs,   REDUCE glipizide to ONE daily  Please work on good  health habits so that your health will improve. 1. Commitment to daily physical activity for 30 to 60  minutes, if you are able to do this.  2. Commitment to wise food choices. Aim for half of your  food intake to be vegetable and fruit, one quarter starchy foods, and one quarter protein. Try to eat on a regular schedule  3 meals per day, snacking between meals should be limited to vegetables or fruits or small portions of nuts. 64 ounces of water per day is generally recommended, unless you have specific health conditions, like heart failure or kidney failure where you will need to limit fluid intake.  3. Commitment to sufficient and a  good quality of physical and mental rest daily, generally between 6 to 8 hours per day.  WITH PERSISTANCE AND PERSEVERANCE, THE IMPOSSIBLE , BECOMES THE NORM!

## 2016-08-21 NOTE — Assessment & Plan Note (Signed)
Improved, reduce glipizide dose Ms. Curless is reminded of the importance of commitment to daily physical activity for 30 minutes or more, as able and the need to limit carbohydrate intake to 30 to 60 grams per meal to help with blood sugar control.   The need to take medication as prescribed, test blood sugar as directed, and to call between visits if there is a concern that blood sugar is uncontrolled is also discussed.   Ms. Buckhannon is reminded of the importance of daily foot exam, annual eye examination, and good blood sugar, blood pressure and cholesterol control.  Diabetic Labs Latest Ref Rng & Units 07/21/2016 05/05/2016 04/07/2016 01/16/2016 10/15/2015  HbA1c <5.7 % 6.4(H) - 7.8(H) 8.8(H) 7.5(H)  Microalbumin Not Estab. ug/mL - <3.0(H) - - -  Micro/Creat Ratio 0.0 - 30.0 mg/g creat - <2.8 - - -  Chol 125 - 200 mg/dL 121(L) - - 178 -  HDL >=46 mg/dL 42(L) - - 44(L) -  Calc LDL <130 mg/dL 69 - - 115 -  Triglycerides <150 mg/dL 50 - - 95 -  Creatinine 0.50 - 1.05 mg/dL 1.11(H) - 1.37(H) 1.40(H) 1.20(H)   BP/Weight 08/21/2016 06/23/2016 05/30/2016 05/05/2016 05/01/2016 01/31/2016 123XX123  Systolic BP 123456 93 - 123456 90 123456 [id number]  Diastolic BP 80 50 - 72 60 76 66  Wt. (Lbs) 196 188 188 188.12 189.2 192.3 192.5  BMI 38.28 36.72 36.72 36.74 36.95 37.56 37.59   Foot/eye exam completion dates 08/21/2016 08/15/2015  Foot Form Completion Done Done

## 2016-08-21 NOTE — Assessment & Plan Note (Signed)
Reports disabling pain rated at an 8, with instability, awakens patient, previous imaging shows pathology, refer to ortho for re eval

## 2016-08-25 ENCOUNTER — Ambulatory Visit (INDEPENDENT_AMBULATORY_CARE_PROVIDER_SITE_OTHER): Payer: Medicare Other

## 2016-08-25 ENCOUNTER — Ambulatory Visit (INDEPENDENT_AMBULATORY_CARE_PROVIDER_SITE_OTHER): Payer: Medicare Other | Admitting: Orthopedic Surgery

## 2016-08-25 ENCOUNTER — Encounter: Payer: Self-pay | Admitting: Orthopedic Surgery

## 2016-08-25 VITALS — BP 105/69 | HR 81 | Wt 195.0 lb

## 2016-08-25 DIAGNOSIS — G8929 Other chronic pain: Secondary | ICD-10-CM | POA: Diagnosis not present

## 2016-08-25 DIAGNOSIS — M25561 Pain in right knee: Secondary | ICD-10-CM | POA: Diagnosis not present

## 2016-08-25 DIAGNOSIS — M25562 Pain in left knee: Secondary | ICD-10-CM

## 2016-08-25 DIAGNOSIS — M170 Bilateral primary osteoarthritis of knee: Secondary | ICD-10-CM

## 2016-08-25 MED ORDER — TRAMADOL-ACETAMINOPHEN 37.5-325 MG PO TABS: 1.0000 | ORAL_TABLET | Freq: Four times a day (QID) | ORAL | 5 refills | Status: DC | PRN

## 2016-08-25 MED ORDER — DICLOFENAC POTASSIUM 50 MG PO TABS: 50.0000 mg | ORAL_TABLET | Freq: Two times a day (BID) | ORAL | 3 refills | Status: DC

## 2016-08-25 NOTE — Patient Instructions (Signed)
Generic Knee Exercises EXERCISES Do 3 sets of 10 repetitions of each exercise daily STRENGTHENING EXERCISES These exercises may help you when beginning to rehabilitate your injury. They may resolve your symptoms with or without further involvement from your physician, physical therapist, or athletic trainer. While completing these exercises, remember:   Muscles can gain both the endurance and the strength needed for everyday activities through controlled exercises.  Complete these exercises as instructed by your physician, physical therapist, or athletic trainer. Progress the resistance and repetitions only as guided.  You may experience muscle soreness or fatigue, but the pain or discomfort you are trying to eliminate should never worsen during these exercises. If this pain does worsen, stop and make certain you are following the directions exactly. If the pain is still present after adjustments, discontinue the exercise until you can discuss the trouble with your clinician. STRENGTH - Quadriceps, Isometrics  Lie on your back with your right / left leg extended and your opposite knee bent.  Gradually tense the muscles in the front of your right / left thigh. You should see either your knee cap slide up toward your hip or increased dimpling just above the knee. This motion will push the back of the knee down toward the floor/mat/bed on which you are lying.  Hold the muscle as tight as you can without increasing your pain for __________ seconds.  Relax the muscles slowly and completely in between each repetition. STRENGTH - Quadriceps, Short Arcs   Lie on your back. Place a rolled under your knee so that the knee slightly bends.  Raise only your lower leg by tightening the muscles in the front of your thigh. Do not allow your thigh to rise. STRENGTH - Quadriceps, Straight Leg Raises  Quality counts! Watch for signs that the quadriceps muscle is working to insure you are strengthening the  correct muscles and not "cheating" by substituting with healthier muscles.  Lay on your back with your right / left leg extended and your opposite knee bent.  Tense the muscles in the front of your right / left thigh. You should see either your knee cap slide up or increased dimpling just above the knee. Your thigh may even quiver.  Tighten these muscles even more and raise your leg 4 to 6 inches off the floor. Keeping these muscles tense, lower your leg.  Relax the muscles slowly and completely in between each repetition. STRENGTH - Hamstring, Curls  Lay on your stomach with your legs extended. (If you lay on a bed, your feet may hang over the edge.)  Tighten the muscles in the back of your thigh to bend your right / left knee up to 90 degrees. Keep your hips flat on the bed/floor.  Hold this position for __________ seconds.  Slowly lower your leg back to the starting position. STRENGTH - Quadriceps, Squats  Stand in a door frame so that your feet and knees are in line with the frame.  Use your hands for balance, not support, on the frame.  Slowly lower your weight, bending at the hips and knees. Keep your lower legs upright so that they are parallel with the door frame. Squat only within the range that does not increase your knee pain. Never let your hips drop below your knees.  Slowly return upright, pushing with your legs, not pulling with your hands. STRENGTH - Quadriceps, Wall Slides  Follow guidelines for form closely. Increased knee pain often results from poorly placed feet or knees.  Lean against a smooth wall or door and walk your feet out 18-24 inches. Place your feet hip-width apart.  Slowly slide down the wall or door until your knees bend __________ degrees.* Keep your knees over your heels, not your toes, and in line with your hips, not falling to either side.   WEIGHT LOSS  EXERCISES  TAKE ULTRACET  TAKE DICLOFENAC

## 2016-08-25 NOTE — Progress Notes (Signed)
Patient ID: Kelli Spencer, female   DOB: 30-Sep-1966, 50 y.o.   MRN: [id number]  Chief Complaint  Patient presents with  . Knee Pain    bilateral knee pain    HPI Kelli Spencer is a 50 y.o. female.  Presents for evaluation of bilateral knee pain  Patient is had ongoing problems in both knees previously she was on Indocin. She complains of bilateral knee pain medial compartment associated with loss of motion swelling and difficulties with normal activities  Review of Systems Review of Systems  Constitutional: Negative for diaphoresis and fatigue.  Respiratory: Negative for shortness of breath.   Cardiovascular: Negative for chest pain.    Past Medical History:  Diagnosis Date  . Arthritis   . Asthma   . Diabetes mellitus without complication ([place])   . Diastolic hypertension   . Hyperlipidemia   . Hypertension   . Migraines   . Obesity     Past Surgical History:  Procedure Laterality Date  . ABDOMINAL HYSTERECTOMY    . COLONOSCOPY N/A 06/23/2016   Procedure: COLONOSCOPY;  Surgeon: Danie Binder, MD;  Location: [facility];  Service: Endoscopy;  Laterality: N/A;  11:30 AM  . KNEE ARTHROSCOPY WITH MEDIAL MENISECTOMY Left 02/09/2014   Procedure: KNEE ARTHROSCOPY WITH MEDIAL MENISECTOMY;  Surgeon: Carole Civil, MD;  Location: [facility];  Service: Orthopedics;  Laterality: Left;  . PARTIAL HYSTERECTOMY      Social History Social History  Substance Use Topics  . Smoking status: Never Smoker  . Smokeless tobacco: Never Used  . Alcohol use No    Allergies  Allergen Reactions  . Prednisone Swelling and Other (See Comments)    Made mouth and throat feel numb  . Ace Inhibitors Cough  . Metformin And Related Diarrhea    Current Meds  Medication Sig  . albuterol (PROVENTIL HFA;VENTOLIN HFA) 108 (90 Base) MCG/ACT inhaler Inhale 2 puffs into the lungs every 6 (six) hours as needed for wheezing or shortness of breath.  Marland Kitchen amLODipine (NORVASC) 2.5 MG tablet Take 1 tablet  (2.5 mg total) by mouth daily.  . cholecalciferol (VITAMIN D) 400 UNITS TABS tablet Take 800 Units by mouth daily.  Marland Kitchen glipiZIDE (GLUCOTROL) 10 MG tablet One tablet once daily  . glucose blood (ONETOUCH VERIO) test strip Once daily testing  . hydrOXYzine (ATARAX/VISTARIL) 50 MG tablet TAKE ONE TABLET BY MOUTH AT BEDTIME FOR ITCHING AND ANXIETY  . indomethacin (INDOCIN) 25 MG capsule Take 25 mg by mouth every 8 (eight) hours as needed (for gout). Reported on 01/16/2016  . linagliptin (TRADJENTA) 5 MG TABS tablet Take 1 tablet (5 mg total) by mouth daily.  Marland Kitchen omeprazole (PRILOSEC) 40 MG capsule Take 1 capsule (40 mg total) by mouth 2 (two) times daily.  Glory Rosebush DELICA LANCETS [id number] MISC For use with OneTouch Verio Flex; Once daily testing  . pravastatin (PRAVACHOL) 10 MG tablet Take 1 tablet (10 mg total) by mouth daily.  . sitaGLIPtin (JANUVIA) 100 MG tablet Take 1 tablet (100 mg total) by mouth daily.      Physical Exam Physical Exam BP 105/69   Pulse 81   Wt 195 lb (88.5 kg)   BMI 38.08 kg/m   Gen. appearance. The patient is well-developed and well-nourished, grooming and hygiene are normal. There are no gross congenital abnormalities  The patient is alert and oriented to person place and time  Mood and affect are normal  Ambulation Struggling with ambulation and slight limping.  Examination reveals  the following: On inspection we find medial joint line tenderness in both knees  With the range of motion of  120 on the right as well as the left knee  Stability tests were normal  and sagittal and coronal plane in the right and left knee  Strength tests revealed grade 5 motor strength in both lower extremities    Skin we find no rash ulceration or erythema in the right or left leg  Sensation remains intact in both the right and left leg  Impression vascular system shows no peripheral edema in either right or left leg  Data Reviewed Bilateral x-rays show medial compartment  arthritis moderate to severe  Assessment    Encounter Diagnoses  Name Primary?  . Chronic pain of right knee   . Chronic pain of left knee   . Bilateral primary osteoarthritis of knee Yes       Plan    Recommend anti-inflammatories and Ultracet. In if she doesn't improve after 3 months then we recommend injections. We also recommend weight loss and knee exercises for strengthening       Arther Abbott 08/25/2016, 10:49 AM

## 2016-08-26 ENCOUNTER — Other Ambulatory Visit: Payer: Self-pay

## 2016-08-26 DIAGNOSIS — E669 Obesity, unspecified: Principal | ICD-10-CM

## 2016-08-26 DIAGNOSIS — E785 Hyperlipidemia, unspecified: Secondary | ICD-10-CM

## 2016-08-26 DIAGNOSIS — E1169 Type 2 diabetes mellitus with other specified complication: Secondary | ICD-10-CM

## 2016-08-26 MED ORDER — OMEPRAZOLE 40 MG PO CPDR: 40.0000 mg | DELAYED_RELEASE_CAPSULE | Freq: Two times a day (BID) | ORAL | 1 refills | Status: DC

## 2016-08-26 MED ORDER — SITAGLIPTIN PHOSPHATE 100 MG PO TABS: 100.0000 mg | ORAL_TABLET | Freq: Every day | ORAL | 1 refills | Status: DC

## 2016-08-26 MED ORDER — PRAVASTATIN SODIUM 10 MG PO TABS: 10.0000 mg | ORAL_TABLET | Freq: Every day | ORAL | 1 refills | Status: DC

## 2016-08-26 MED ORDER — GLIPIZIDE 10 MG PO TABS: ORAL_TABLET | ORAL | 1 refills | Status: DC

## 2016-08-26 MED ORDER — LINAGLIPTIN 5 MG PO TABS: 5.0000 mg | ORAL_TABLET | Freq: Every day | ORAL | 1 refills | Status: DC

## 2016-08-26 MED ORDER — ALBUTEROL SULFATE HFA 108 (90 BASE) MCG/ACT IN AERS: 2.0000 | INHALATION_SPRAY | Freq: Four times a day (QID) | RESPIRATORY_TRACT | 1 refills | Status: DC | PRN

## 2016-08-28 ENCOUNTER — Encounter: Payer: Self-pay | Admitting: Nutrition

## 2016-08-28 ENCOUNTER — Encounter: Payer: Medicare Other | Attending: Family Medicine | Admitting: Nutrition

## 2016-08-28 VITALS — Ht 60.0 in | Wt 195.0 lb

## 2016-08-28 DIAGNOSIS — E119 Type 2 diabetes mellitus without complications: Secondary | ICD-10-CM | POA: Diagnosis not present

## 2016-08-28 DIAGNOSIS — Z713 Dietary counseling and surveillance: Secondary | ICD-10-CM | POA: Insufficient documentation

## 2016-08-28 DIAGNOSIS — E669 Obesity, unspecified: Secondary | ICD-10-CM | POA: Insufficient documentation

## 2016-08-28 NOTE — Patient Instructions (Signed)
Goals 1 Eat three meals per day 2. Eat 2-3 carb choices per meal 3. Increase low carb vegetables. 4. Eat Protein at every meal Don't skip meals Exercise 15-30 minutes daily. Lose 1 lb per week Keep A1C less than 6.5%

## 2016-08-28 NOTE — Progress Notes (Signed)
Diabetes Self-Management Education  Visit Type:  Follow-up  Appt. Start Time: 1000 Appt. End Time: 1030  08/28/2016  [name], identified by name and date of birth, is a 50 y.o. female with a diagnosis of Diabetes:   Changed eating habits but cutingt down on portions, eating more fresh fruit and vegetables.  A1C down to 6.4% from 7.8%. Reduced Januvia to 100 mg a day now instead of 200 mg/d from Dr. Moshe Cipro. Her ex husband has been helping her with her diabetes.  Physical activity: trying to walking the steps and doing chair exercises due to leg issues.   Diet is inconsistent and too restrictive of CHO at meals. Skipping meals and may be having low blood sugars. Diet too restrictive and not allowing weight loss.   Gained 4 lbs.  Limited ability to read. Use mostly visual education tools and plate method.   ASSESSMENT  Height 5' (1.524 m), weight 195 lb (88.5 kg). Body mass index is 38.08 kg/m.        Diabetes Self-Management Education - 08/28/16 1005      Health Coping   How would you rate your overall health? Good     Complications   Last HgB A1C per patient/outside source 6.4 %   How often do you check your blood sugar? 1-2 times/day   Fasting Blood glucose range (mg/dL) 130-179   Number of hypoglycemic episodes per month 0   Have you had a dilated eye exam in the past 12 months? No   Have you had a dental exam in the past 12 months? No   Are you checking your feet? Yes   How many days per week are you checking your feet? 7     Dietary Intake   Breakfast Boiled egg ;   Lunch skipped   Dinner CHicken livers,  cream potatoes   Beverage(s) water     Exercise   Exercise Type ADL's     Patient Education   Nutrition management  Food label reading, portion sizes and measuring food.;Carbohydrate counting;Reviewed blood glucose goals for pre and post meals and how to evaluate the patients' food intake on their blood glucose level.;Meal timing in regards to the  patients' current diabetes medication.;Information on hints to eating out and maintain blood glucose control.;Meal options for control of blood glucose level and chronic complications.   Acute complications Taught treatment of hypoglycemia - the 15 rule.  Need to make sure eating 30-45 grams of carbs to prevent hypoglcymia   Chronic complications Relationship between chronic complications and blood glucose control;Assessed and discussed foot care and prevention of foot problems;Identified and discussed with patient  current chronic complications   Psychosocial adjustment Worked with patient to identify barriers to care and solutions;Role of stress on diabetes;Identified and addressed patients feelings and concerns about diabetes   Personal strategies to promote health Lifestyle issues that need to be addressed for better diabetes care     Individualized Goals (developed by patient)   Nutrition Follow meal plan discussed;Adjust meds/carbs with exercise as discussed   Physical Activity Exercise 3-5 times per week;15 minutes per day   Medications take my medication as prescribed   Monitoring  test my blood glucose as discussed;test blood glucose pre and post meals as discussed;send in my blood glucose log as discussed   Reducing Risk examine blood glucose patterns;get labs drawn;do foot checks daily     Patient Self-Evaluation of Goals - Patient rates self as meeting previously set goals (% of time)  Nutrition 50 - 75 %   Physical Activity < 25%  Limited due to leg issues   Medications >75%   Monitoring >75%   Problem Solving >75%   Reducing Risk >75%   Health Coping >75%     Post-Education Assessment   Patient understands the diabetes disease and treatment process. Demonstrates understanding / competency   Patient understands incorporating nutritional management into lifestyle. Needs Review   Patient undertands incorporating physical activity into lifestyle. Needs Review     Outcomes    Program Status Completed      Learning Objective:  Patient will have a greater understanding of diabetes self-management. Patient education plan is to attend individual and/or group sessions per assessed needs and concerns.   Plan:   Patient Instructions  Goals 1 Eat three meals per day 2. Eat 2-3 carb choices per meal 3. Increase low carb vegetables. 4. Eat Protein at every meal Don't skip meals Exercise 15-30 minutes daily. Lose 1 lb per week Keep A1C less than 6.5%    Expected Outcomes:  Demonstrated interest in learning. Expect positive outcomes  Education material provided: Meal plan card and My Plate  If problems or questions, patient to contact team via:  Phone and Email  Future DSME appointment: - 3-4 months Notify MD if BS are dropping below 70's.

## 2016-09-11 ENCOUNTER — Other Ambulatory Visit: Payer: Self-pay | Admitting: Family Medicine

## 2016-09-15 ENCOUNTER — Telehealth: Payer: Self-pay | Admitting: Family Medicine

## 2016-09-15 ENCOUNTER — Other Ambulatory Visit: Payer: Self-pay | Admitting: Family Medicine

## 2016-09-15 NOTE — Telephone Encounter (Signed)
Spoke with patient.  She will try to arrange transportation and call back for an appointment on 11/14.  States that diastolic readings have been in the 80s and she also c/o daily headache.

## 2016-09-15 NOTE — Telephone Encounter (Signed)
Kelli Spencer is calling stating that her Blood Pressure is running real high and she dont know what to do about it, I asked her if she could come up here so a  Nurse could check it and she stated that she had no transportation, please advise?

## 2016-10-07 ENCOUNTER — Other Ambulatory Visit: Payer: Self-pay

## 2016-10-07 ENCOUNTER — Encounter: Payer: Self-pay | Admitting: Family Medicine

## 2016-10-07 ENCOUNTER — Ambulatory Visit (INDEPENDENT_AMBULATORY_CARE_PROVIDER_SITE_OTHER): Payer: Medicare Other | Admitting: Family Medicine

## 2016-10-07 VITALS — BP 126/82 | HR 80 | Resp 18 | Ht 60.0 in | Wt 194.1 lb

## 2016-10-07 DIAGNOSIS — Z1159 Encounter for screening for other viral diseases: Secondary | ICD-10-CM

## 2016-10-07 DIAGNOSIS — I10 Essential (primary) hypertension: Secondary | ICD-10-CM

## 2016-10-07 DIAGNOSIS — Z0000 Encounter for general adult medical examination without abnormal findings: Secondary | ICD-10-CM

## 2016-10-07 DIAGNOSIS — Z114 Encounter for screening for human immunodeficiency virus [HIV]: Secondary | ICD-10-CM

## 2016-10-07 DIAGNOSIS — E559 Vitamin D deficiency, unspecified: Secondary | ICD-10-CM

## 2016-10-07 DIAGNOSIS — Z2821 Immunization not carried out because of patient refusal: Secondary | ICD-10-CM

## 2016-10-07 DIAGNOSIS — E782 Mixed hyperlipidemia: Secondary | ICD-10-CM

## 2016-10-07 DIAGNOSIS — E1165 Type 2 diabetes mellitus with hyperglycemia: Secondary | ICD-10-CM

## 2016-10-07 DIAGNOSIS — E118 Type 2 diabetes mellitus with unspecified complications: Secondary | ICD-10-CM

## 2016-10-07 LAB — POCT URINALYSIS DIPSTICK
BILIRUBIN UA: NEGATIVE
GLUCOSE UA: NEGATIVE
Ketones, UA: NEGATIVE
Leukocytes, UA: NEGATIVE
Nitrite, UA: NEGATIVE
Protein, UA: NEGATIVE
RBC UA: NEGATIVE
SPEC GRAV UA: 1.025
Urobilinogen, UA: 0.2
pH, UA: 6

## 2016-10-07 NOTE — Assessment & Plan Note (Signed)
Re educated re importance of immunization, still deciding against all immunization needed, will continue to re educate and encourage

## 2016-10-07 NOTE — Progress Notes (Signed)
Subjective:    Kelli Spencer is a 50 y.o. female who presents for a Welcome to Medicare exam.   Review of Systems Cardiac Risk Factors include: dyslipidemia;diabetes mellitus;hypertension;obesity (BMI >30kg/m2), inactivity       Objective:    Today's Vitals   10/07/16 1134  BP: 126/82  Pulse: 80  Resp: 18  SpO2: 98%  Weight: 194 lb 1.9 oz (88.1 kg)  Height: 5' (1.524 m)  Body mass index is 37.91 kg/m.  Medications Outpatient Encounter Prescriptions as of 10/07/2016  Medication Sig  . albuterol (PROVENTIL HFA;VENTOLIN HFA) 108 (90 Base) MCG/ACT inhaler Inhale 2 puffs into the lungs every 6 (six) hours as needed for wheezing or shortness of breath.  Marland Kitchen amLODipine (NORVASC) 2.5 MG tablet Take 1 tablet (2.5 mg total) by mouth daily.  . cholecalciferol (VITAMIN D) 400 UNITS TABS tablet Take 800 Units by mouth daily.  . diclofenac (CATAFLAM) 50 MG tablet Take 1 tablet (50 mg total) by mouth 2 (two) times daily.  Marland Kitchen glipiZIDE (GLUCOTROL) 10 MG tablet One tablet once daily  . glucose blood (ONETOUCH VERIO) test strip Once daily testing  . hydrOXYzine (ATARAX/VISTARIL) 50 MG tablet TAKE ONE TABLET BY MOUTH AT BEDTIME FOR ITCHING AND ANXIETY  . linagliptin (TRADJENTA) 5 MG TABS tablet Take 1 tablet (5 mg total) by mouth daily.  Marland Kitchen omeprazole (PRILOSEC) 40 MG capsule Take 1 capsule (40 mg total) by mouth 2 (two) times daily.  Glory Rosebush DELICA LANCETS [id number] MISC TO USE WITH ONE TOUCH VERIO FLEX, TEST ONCE A DAY  . pravastatin (PRAVACHOL) 10 MG tablet Take 1 tablet (10 mg total) by mouth daily.  . sitaGLIPtin (JANUVIA) 100 MG tablet Take 1 tablet (100 mg total) by mouth daily.  . traMADol-acetaminophen (ULTRACET) 37.5-325 MG tablet Take 1 tablet by mouth every 6 (six) hours as needed.  . [DISCONTINUED] indomethacin (INDOCIN) 25 MG capsule Take 25 mg by mouth every 8 (eight) hours as needed (for gout). Reported on 01/16/2016   No facility-administered encounter medications on file as of  10/07/2016.      History: Past Medical History:  Diagnosis Date  . Arthritis   . Asthma   . Diabetes mellitus without complication ([place])   . Diastolic hypertension   . Hyperlipidemia   . Hypertension   . Migraines   . Obesity    Past Surgical History:  Procedure Laterality Date  . ABDOMINAL HYSTERECTOMY    . COLONOSCOPY N/A 06/23/2016   Procedure: COLONOSCOPY;  Surgeon: Danie Binder, MD;  [facility];  Service: Endoscopy;  Laterality: N/A;  11:30 AM  . KNEE ARTHROSCOPY WITH MEDIAL MENISECTOMY Left 02/09/2014   Procedure: KNEE ARTHROSCOPY WITH MEDIAL MENISECTOMY;  Surgeon: Carole Civil, MD;  Location: [facility];  Service: Orthopedics;  Laterality: Left;  . PARTIAL HYSTERECTOMY      Family History  Problem Relation Age of Onset  . Kidney failure Mother   . Diabetes Mother   . Hypertension Mother   . Stroke Mother   . Diabetes Father   . Heart disease Father    Social History   Occupational History  . employed     Social History Main Topics  . Smoking status: Never Smoker  . Smokeless tobacco: Never Used  . Alcohol use No  . Drug use: No  . Sexual activity: Not on file    Tobacco Counseling Counseling given: Not Answered   Immunizations and Health Maintenance  There is no immunization history on file for  this patient. Health Maintenance Due  Topic Date Due  . Hepatitis C Screening  29-Oct-1966  . HIV Screening  08/03/1979  . PAP SMEAR  04/27/2014    Activities of Daily Living In your present state of health, do you have any difficulty performing the following activities: 10/07/2016  Hearing? N  Vision? N  Difficulty concentrating or making decisions? N  Walking or climbing stairs? Y  Dressing or bathing? N  Doing errands, shopping? N  Preparing Food and eating ? N  Using the Toilet? N  In the past six months, have you accidently leaked urine? N  Do you have problems with loss of bowel control? N  Managing your Medications? N  Managing  your Finances? N  Housekeeping or managing your Housekeeping? N  Some recent data might be hidden    Physical Exam BP 126/82   Pulse 80   Resp 18   Ht 5' (1.524 m)   Wt 194 lb 1.9 oz (88.1 kg)   SpO2 98%   BMI 37.91 kg/m  optional), or other factors deemed appropriate based on the beneficiary's medical and social history and current clinical standards.No physical exam  Advanced Directives:discussed and written info provided, encouraged to follow through on this      Assessment:    This is a welcome to medicare exam for this patient.  Welcome to Medicare preventive visit Annual exam as documented. Counseling done  re healthy lifestyle involving commitment to 150 minutes exercise per week, heart healthy diet, and attaining healthy weight.The importance of adequate sleep also discussed. Regular seat belt use and home safety, is also discussed. Changes in health habits are decided on by the patient with goals and time frames  set for achieving them. Immunization and cancer screening needs are specifically addressed at this visit. Advanced directives discussed and information provided EKG in office shows no ischemia, no LVH and NSR Urinalysis at visit is normal  Immunization refused Re educated re importance of immunization, still deciding against all immunization needed, will continue to re educate and encourage      Vision/Hearing screen  Visual Acuity Screening   Right eye Left eye Both eyes  Without correction: 20/40 20/40 20/40   With correction:       Dietary issues and exercise activities discussed:  Current Exercise Habits: The patient does not participate in regular exercise at present  Goals    None     Depression Screen PHQ 2/9 Scores 10/07/2016 08/28/2016 05/30/2016 01/16/2014  PHQ - 2 Score 0 0 0 4  PHQ- 9 Score - - - 13     Fall Risk Fall Risk  10/07/2016  Falls in the past year? No    Cognitive Function:        Patient Care Team: Fayrene Helper, MD as PCP - General (Family Medicine) Carole Civil, MD as Consulting Physician (Orthopedic Surgery) Arlana Lindau, RD as Dietitian (Nutrition)     Plan:     During the course of the visit the patient was educated and counseled about the following appropriate screening and preventive services:   Vaccines to include Pneumoccal, Influenza, Hepatitis B, Td, Zostavax, HCV  Electrocardiogram  Cardiovascular Disease  Colorectal cancer screening  Bone density screening  Diabetes screening  Glaucoma screening  Mammography/PAP  Nutrition counseling  Her current medications and allergies were reviewed and needed refills of her chronic medications were ordered. The plan for yearly health maintenance was discussed and all orders and referrals were made as  appropriate.  Patient Instructions (the written plan) was given to the patient.   Denman George [place], [place] [address]

## 2016-10-07 NOTE — Assessment & Plan Note (Addendum)
Annual exam as documented. Counseling done  re healthy lifestyle involving commitment to 150 minutes exercise per week, heart healthy diet, and attaining healthy weight.The importance of adequate sleep also discussed. Regular seat belt use and home safety, is also discussed. Changes in health habits are decided on by the patient with goals and time frames  set for achieving them. Immunization and cancer screening needs are specifically addressed at this visit. Advanced directives discussed and information provided EKG in office shows no ischemia, no LVH and NSR Urinalysis at visit is normal

## 2016-10-07 NOTE — Patient Instructions (Addendum)
F/u in 2 month, call if you need me sooner  STOP Januvia once you finish current bottle  Please commit to 30 mins exercise every day, chair exercises and floor exercises are good  Eat "green and clean" to improve your health!  Rethink vaccines, they save lives, you need them!  Please review info on advanced directives and living will  Fasting lipid, cmp and eGFR, hBA1C , HIV and Hep C scree and Vit D in 2 and , 1 week prior to appt  Thank you  for choosing [facility]. We consider it a privelige to serve you.  Delivering excellent health care in a caring and  compassionate way is our goal.  Partnering with you,  so that together we can achieve this goal is our strategy.

## 2016-10-28 ENCOUNTER — Other Ambulatory Visit: Payer: Self-pay | Admitting: Family Medicine

## 2016-11-25 ENCOUNTER — Ambulatory Visit (INDEPENDENT_AMBULATORY_CARE_PROVIDER_SITE_OTHER): Payer: Medicare Other | Admitting: Orthopedic Surgery

## 2016-11-25 DIAGNOSIS — M25561 Pain in right knee: Secondary | ICD-10-CM

## 2016-11-25 DIAGNOSIS — G8929 Other chronic pain: Secondary | ICD-10-CM

## 2016-11-25 DIAGNOSIS — M170 Bilateral primary osteoarthritis of knee: Secondary | ICD-10-CM

## 2016-11-25 DIAGNOSIS — M5410 Radiculopathy, site unspecified: Secondary | ICD-10-CM

## 2016-11-25 DIAGNOSIS — M25562 Pain in left knee: Secondary | ICD-10-CM | POA: Diagnosis not present

## 2016-11-25 MED ORDER — ACETAMINOPHEN-CODEINE #3 300-30 MG PO TABS: 1.0000 | ORAL_TABLET | Freq: Four times a day (QID) | ORAL | 0 refills | Status: DC | PRN

## 2016-11-25 MED ORDER — DICLOFENAC POTASSIUM 50 MG PO TABS: 50.0000 mg | ORAL_TABLET | Freq: Two times a day (BID) | ORAL | 3 refills | Status: DC

## 2016-11-25 NOTE — Addendum Note (Signed)
Addended by: Baldomero Lamy B on: 11/25/2016 11:02 AM   Modules accepted: Orders

## 2016-11-25 NOTE — Patient Instructions (Signed)
Stop Ultracet  Continue diclofenac  Start physical therapy for lumbar spine  You have received an injection of steroids into the joint. 15% of patients will have increased pain within the 24 hours postinjection.   This is transient and will go away.   We recommend that you use ice packs on the injection site for 20 minutes every 2 hours and extra strength Tylenol 2 tablets every 8 as needed until the pain resolves.  If you continue to have pain after taking the Tylenol and using the ice please call the office for further instructions.  Continue diet control and weight loss

## 2016-11-25 NOTE — Progress Notes (Addendum)
Patient ID: Kelli Spencer, female   DOB: Jun 20, 1966, 51 y.o.   MRN: [id number]  Chief Complaint  Patient presents with  . Follow-up    BILATERAL KNEE OA    HPI Kelli Spencer is a 51 y.o. female.   HPI  51 year old female with bilateral knee arthritis prior treatment included Relafen, Tylenol with codeine, diclofenac, Ultracet. Pain not controlled. We also advised weight loss and exercise. Patient still having bilateral knee pain Complains of increasing bilateral knee pain some right lower leg pain some giving way she has had for several months now  No trauma Review of Systems Review of Systems   Right lateral leg pain radiating down from the hip  Lower back pain  Note numbness tingling or weakness  Past Medical History:  Diagnosis Date  . Arthritis   . Diabetes mellitus without complication ([place])   . Diastolic hypertension   . Hyperlipidemia   . Hypertension   . Migraines   . Obesity      Physical Exam  Constitutional: She is oriented to person, place, and time. She appears well-developed and well-nourished. No distress.  Cardiovascular: Normal rate and intact distal pulses.   Neurological: She is alert and oriented to person, place, and time. She has normal reflexes. She exhibits normal muscle tone. Coordination normal.  Skin: Skin is warm and dry. No rash noted. She is not diaphoretic. No erythema. No pallor.  Psychiatric: She has a normal mood and affect. Her behavior is normal. Judgment and thought content normal.    Right knee medial joint line tenderness mild varus alignment flexion 125 collateral ligaments and cruciate ligaments remain stable muscle tone and strength are normal skin is intact without rash distal pulses are intact no peripheral edema sensation in the lower leg is normal  Left knee also medial joint line tenderness with mild varus alignment flexion approximately 125 cruciate and collateral ligaments are stable to stress testing muscle tone and  strength are normal skin is intact without rash, distal pulses are intact no peripheral edema sensation is normal  Patient has lower back tenderness and right sided lower back tenderness   MEDICAL DECISION MAKING  DATA   Previous x-rays taken October 2017 show medial compartment joint space narrowing bilaterally with right knee in slight varus as well as the left. The medial compartment has greater than 75% loss of cartilage there are some mild subchondral bone changes consistent with secondary arthritic change    DIAGNOSIS  Encounter Diagnoses  Name Primary?  . Chronic pain of right knee   . Chronic pain of left knee   . Bilateral primary osteoarthritis of knee Yes  . Back pain with right-sided radiculopathy      PLAN(RISK)     Procedure note left knee injection verbal consent was obtained to inject left knee joint  Timeout was completed to confirm the site of injection  The medications used were 40 mg of Depo-Medrol and 1% lidocaine 3 cc  Anesthesia was provided by ethyl chloride and the skin was prepped with alcohol.  After cleaning the skin with alcohol a 20-gauge needle was used to inject the left knee joint. There were no complications. A sterile bandage was applied.   Procedure note right knee injection verbal consent was obtained to inject right knee joint  Timeout was completed to confirm the site of injection  The medications used were 40 mg of Depo-Medrol and 1% lidocaine 3 cc  Anesthesia was provided by ethyl chloride and the skin was  prepped with alcohol.  After cleaning the skin with alcohol a 20-gauge needle was used to inject the right knee joint. There were no complications. A sterile bandage was applied.  We injected both knees  Changed her Ultracet Tylenol 3  Continue diclofenac  Physical therapy for her lower back  Continue weight loss and exercise  Return in 3 months

## 2016-12-01 ENCOUNTER — Ambulatory Visit: Payer: Medicare Other | Admitting: Nutrition

## 2016-12-02 ENCOUNTER — Encounter (HOSPITAL_COMMUNITY): Payer: Self-pay | Admitting: Physical Therapy

## 2016-12-02 ENCOUNTER — Ambulatory Visit (HOSPITAL_COMMUNITY): Payer: Medicare Other | Attending: Orthopedic Surgery | Admitting: Physical Therapy

## 2016-12-02 DIAGNOSIS — R2689 Other abnormalities of gait and mobility: Secondary | ICD-10-CM | POA: Insufficient documentation

## 2016-12-02 DIAGNOSIS — M545 Low back pain: Secondary | ICD-10-CM | POA: Insufficient documentation

## 2016-12-02 DIAGNOSIS — M25562 Pain in left knee: Secondary | ICD-10-CM | POA: Insufficient documentation

## 2016-12-02 DIAGNOSIS — G8929 Other chronic pain: Secondary | ICD-10-CM | POA: Insufficient documentation

## 2016-12-02 DIAGNOSIS — M25561 Pain in right knee: Secondary | ICD-10-CM | POA: Diagnosis not present

## 2016-12-02 NOTE — Therapy (Signed)
[facility] [place], [place], [address] Phone: [phone]   Fax:  [phone]  Physical Therapy Evaluation  Patient Details  Name: Kelli Spencer MRN: [id number] Date of Birth: May 07, 1966 Referring Provider: Arther Abbott, MD  Encounter Date: 12/02/2016      PT End of Session - 12/02/16 1744    Visit Number 1   Number of Visits 8   Date for PT Re-Evaluation 12/30/16   Authorization Type [employer] Medicare   Authorization Time Period 12/02/16 to 12/30/16   PT Start Time 1602   PT Stop Time 1645   PT Time Calculation (min) 43 min   Activity Tolerance Patient limited by pain   Behavior During Therapy [facility] for tasks assessed/performed      Past Medical History:  Diagnosis Date  . Arthritis   . Diabetes mellitus without complication ([place])   . Diastolic hypertension   . Hyperlipidemia   . Hypertension   . Migraines   . Obesity     Past Surgical History:  Procedure Laterality Date  . ABDOMINAL HYSTERECTOMY  1997   fibroids  . COLONOSCOPY N/A 06/23/2016   Procedure: COLONOSCOPY;  Surgeon: Danie Binder, MD;  [facility];  Service: Endoscopy;  Laterality: N/A;  11:30 AM  . KNEE ARTHROSCOPY WITH MEDIAL MENISECTOMY Left 02/09/2014   Procedure: KNEE ARTHROSCOPY WITH MEDIAL MENISECTOMY;  Surgeon: Carole Civil, MD;  Location: [facility];  Service: Orthopedics;  Laterality: Left;  . PARTIAL HYSTERECTOMY      There were no vitals filed for this visit.       Subjective Assessment - 12/02/16 1603    Subjective Pt reports having knee pain for several years now. She saw Dr. Aline Brochure last week with complaints of B knee pain and swelling (Rt>Lt). She feels like it takes turns going from one to the other. Overall, her pain has gotten progressively worse. She received injections in both knees but states that hasn't helped.    Pertinent History DM, HLD, HTN, Lt knee arthroscopy and medial meniscectomy 2015   Limitations  Walking;Sitting;Standing;Other (comment)  has to stand with knees slightly bent    Diagnostic tests none    Patient Stated Goals decrease pain.    Currently in Pain? Other (Comment)  None currently.   Pain Location Knee   Pain Orientation Right;Left   Pain Descriptors / Indicators Throbbing;Aching   Aggravating Factors  being on her feet too long, sitting stiff for long periods of time and getting up to move around after this    Pain Relieving Factors resting   Effect of Pain on Daily Activities limited activity tolerance             [facility] PT Assessment - 12/02/16 0001      Assessment   Medical Diagnosis back pain with with Rt sided sciatica   Referring Provider Arther Abbott, MD   Onset Date/Surgical Date --  ~2 years ago    Next MD Visit 02/23/17   Prior Therapy none      Precautions   Precautions None     Balance Screen   Has the patient fallen in the past 6 months No   Has the patient had a decrease in activity level because of a fear of falling?  No   Is the patient reluctant to leave their home because of a fear of falling?  No     Home Environment   Additional Comments 4 STE her house  Prior Function   Level of Independence Independent   Vocation On disability     Cognition   Overall Cognitive Status Within Functional Limits for tasks assessed     Observation/Other Assessments   Observations Pt rubbing her knees throughout session, however when asked if her knees were hurting she stated this was just a habit.      Functional Tests   Functional tests Squat;Single leg stance     Squat   Comments weight on toes, pain reported after 1 repetition      Single Leg Stance   Comments Lt: 10 sec, Rt: 3-5 sec. x2 trials      ROM / Strength   AROM / PROM / Strength AROM;Strength     AROM   AROM Assessment Site Lumbar   Lumbar Flexion RFIS x10 reps, no change in symptoms    Lumbar Extension REIS x10 reps, pain end range extension, improved with repeated  movements. No change in knee pain.      Strength   Strength Assessment Site Knee;Hip;Ankle   Right/Left Hip Right;Left   Right Hip Flexion 4+/5   Right Hip Extension 4+/5   Right Hip ABduction 4+/5   Left Hip Flexion 5/5   Left Hip Extension 4+/5   Left Hip ABduction 4+/5   Right/Left Knee Right;Left   Right Knee Flexion 5/5   Right Knee Extension 5/5  pain in anterior knee (same)   Left Knee Flexion 5/5   Left Knee Extension 5/5  pain anterior knee      Flexibility   Soft Tissue Assessment /Muscle Length yes   Quadriceps Rt 90 deg flexion; Lt: 105 deg flexion     Palpation   Patella mobility guarded patellar mobility, unable to accurately assess   Palpation comment tenderness along lumbar paraspinals, with deep palpation. No reproduction of pt symptoms      Special Tests    Special Tests Lumbar   Lumbar Tests Straight Leg Raise;Prone Knee Bend Test;Slump Test     Slump test   Findings Negative     Prone Knee Bend Test   Findings Negative     Straight Leg Raise   Findings Negative     Transfers   Five time sit to stand comments  15 sec, no UE support.      Ambulation/Gait   Pre-Gait Activities ascend 6" steps with Lt step to pattern, descend 6" steps with Lt step to pattern, using 1 handrail. Pain reported    Gait Comments decreased step length, BLE; wide BOS, decreased knee flexion during swing phased of gait cycle     High Level Balance   High Level Balance Comments TUG: 11 sec, no UE                   [facility] Adult PT Treatment/Exercise - 12/02/16 0001      Exercises   Exercises Knee/Hip     Knee/Hip Exercises: Seated   Long Arc Quad Right;1 set;10 reps   Long Arc Quad Limitations no weight for HEP demo to decrease stiffness with prolonged sitting at home      Knee/Hip Exercises: Supine   Bridges Both;1 set;10 reps                PT Education - 12/02/16 1740    Education provided Yes   Education Details discussed eval  findings/POC; HEP initiated; benefits of skilled PT in addressing stiffness/pain with activity   Person(s) Educated Patient   Methods Explanation;Handout  Comprehension Verbalized understanding;Returned demonstration          PT Short Term Goals - 12/02/16 1745      PT SHORT TERM GOAL #1   Title Pt will demo consistency and independence with her HEP to improve strength and activity tolerance    Time 2   Period Weeks   Status New           PT Long Term Goals - 12/02/16 1746      PT LONG TERM GOAL #1   Title Pt will demo imrpoved hip strength to 5/5 MMT, in order to improve performance of functional tasks.    Time 4   Period Weeks   Status New     PT LONG TERM GOAL #2   Title Pt will demo improved functional strength and activity tolerance evident by her ability to perform 5x sit to stand in less than 10 sec without use of her UE.    Time 4   Period Weeks   Status New     PT LONG TERM GOAL #3   Title Pt will maintain single leg balance on each LE up to 15 sec, without LOB, 3/5 trials, to improve safety with stair negotiation.   Time 4   Period Weeks   Status New     PT LONG TERM GOAL #4   Title Pt will demo increased activity tolerance, evident by her report of improved ability to stay on her feet long enough to cook dinner for herself without a rest break.    Time 4   Period Weeks   Status New               Plan - 12/02/16 1755    Clinical Impression Statement Pt is a 51 yo F referred to OPPT with complaints of B knee pain possibly coming from her back. She presents today with anterior knee pain that is worse with weight bearing activity, prolonged walking/standing and alleviated with rest after several minutes off her feet. She demonstrates antalgic gait pattern, has minor limitations in strength, limited hip flexibility, and is tender with palpation along her lumbar paraspinals and the anterior knee of BLE. Patellar mobility was difficult to accurately  assess secondary to quadriceps guarding. Aside from tenderness with deep palpation of the lumbar spine, she shows primary signs of palpable knee pain reproduced with strength testing, joint line tenderness and crepitus with functional activity possibly indicating degenerative changes as the primary source of her pain and limited mobility. She would benefit from skilled PT for several weeks to improve her strength, mobility and activity tolerance so that she will have an improved quality of life and be able to care for herself independently. Discussed eval findings with the pt who verbalized understanding and agreement with proposed PT POT/frequency.     Rehab Potential Good   PT Frequency 2x / week   PT Duration 4 weeks   PT Treatment/Interventions ADLs/Self Care Home Management;Aquatic Therapy;Cryotherapy;Stair training;Gait training;Functional mobility training;Therapeutic activities;Therapeutic exercise;Balance training;Patient/family education;Neuromuscular re-education;Orthotic Fit/Training;Manual techniques;Passive range of motion;Dry needling   PT Next Visit Plan hip strengthening, STM along quadriceps, knee flexion stretch   PT Home Exercise Plan seated LAQ, supine bridge 2x15 reps    Recommended Other Services none    Consulted and Agree with Plan of Care Patient      Patient will benefit from skilled therapeutic intervention in order to improve the following deficits and impairments:  Abnormal gait, Decreased activity tolerance, Decreased balance, Decreased endurance, Decreased  range of motion, Decreased mobility, Difficulty walking, Increased muscle spasms, Impaired flexibility, Postural dysfunction, Decreased strength, Pain, Obesity  Visit Diagnosis: Chronic pain of right knee - Plan: PT plan of care cert/re-cert  Chronic pain of left knee - Plan: PT plan of care cert/re-cert  Other abnormalities of gait and mobility - Plan: PT plan of care cert/re-cert  Low back pain, unspecified  back pain laterality, unspecified chronicity, with sciatica presence unspecified - Plan: PT plan of care cert/re-cert      G-Codes - [id number]    Functional Assessment Tool Used FOTO: 60% limited   Functional Limitation Mobility: Walking and moving around   Mobility: Walking and Moving Around Current Status [phone]) At least 40 percent but less than 60 percent impaired, limited or restricted   Mobility: Walking and Moving Around Goal Status [phone]) At least 40 percent but less than 60 percent impaired, limited or restricted       Problem List Patient Active Problem List   Diagnosis Date Noted  . Dyslipidemia, goal LDL below 100 05/05/2016  . CKD (chronic kidney disease) 02/01/2016  . Vitamin D deficiency 01/16/2016  . GERD (gastroesophageal reflux disease) 08/19/2015  . Gout 08/19/2015  . Immunization refused 10/16/2014  . Medial meniscus, posterior horn derangement 02/02/2014  . DM (diabetes mellitus), type 2, uncontrolled ([place]) 06/25/2013  . Welcome to Medicare preventive visit 06/22/2013  . Chronic pain of both knees 03/23/2013  . Patellar tendonitis 03/22/2013  . Patellar tendinitis of right knee 06/16/2012  . Essential hypertension 04/30/2011  . Obesity 02/10/2008    6:08 PM,12/02/16 Elly Modena PT, DPT [facility] [address] [place], [place], [address] Phone: [phone]   Fax:  [phone]  Name: Kelli Spencer MRN: [id number] Date of Birth: September 28, 1966

## 2016-12-04 ENCOUNTER — Encounter: Payer: Medicare Other | Attending: Family Medicine | Admitting: Nutrition

## 2016-12-04 VITALS — Ht 60.0 in | Wt 194.0 lb

## 2016-12-04 DIAGNOSIS — E669 Obesity, unspecified: Secondary | ICD-10-CM | POA: Insufficient documentation

## 2016-12-04 DIAGNOSIS — E119 Type 2 diabetes mellitus without complications: Secondary | ICD-10-CM | POA: Diagnosis not present

## 2016-12-04 DIAGNOSIS — Z713 Dietary counseling and surveillance: Secondary | ICD-10-CM | POA: Diagnosis not present

## 2016-12-04 DIAGNOSIS — E782 Mixed hyperlipidemia: Secondary | ICD-10-CM | POA: Diagnosis not present

## 2016-12-04 DIAGNOSIS — I10 Essential (primary) hypertension: Secondary | ICD-10-CM | POA: Diagnosis not present

## 2016-12-04 DIAGNOSIS — Z6837 Body mass index (BMI) 37.0-37.9, adult: Secondary | ICD-10-CM | POA: Diagnosis not present

## 2016-12-04 DIAGNOSIS — E559 Vitamin D deficiency, unspecified: Secondary | ICD-10-CM | POA: Diagnosis not present

## 2016-12-04 DIAGNOSIS — E1165 Type 2 diabetes mellitus with hyperglycemia: Secondary | ICD-10-CM | POA: Diagnosis not present

## 2016-12-04 DIAGNOSIS — E118 Type 2 diabetes mellitus with unspecified complications: Secondary | ICD-10-CM | POA: Diagnosis not present

## 2016-12-04 LAB — HEMOGLOBIN A1C
Hgb A1c MFr Bld: 7 % — ABNORMAL HIGH (ref <5.7)
Mean Plasma Glucose: 154 mg/dL

## 2016-12-04 LAB — HIV ANTIBODY (ROUTINE TESTING W REFLEX): HIV: NONREACTIVE

## 2016-12-04 LAB — COMPLETE METABOLIC PANEL WITH GFR
ALBUMIN: 4.4 g/dL (ref 3.6–5.1)
ALK PHOS: 108 U/L (ref 33–130)
ALT: 19 U/L (ref 6–29)
AST: 14 U/L (ref 10–35)
BILIRUBIN TOTAL: 0.5 mg/dL (ref 0.2–1.2)
BUN: 24 mg/dL (ref 7–25)
CO2: 24 mmol/L (ref 20–31)
CREATININE: 1.32 mg/dL — AB (ref 0.50–1.05)
Calcium: 9.7 mg/dL (ref 8.6–10.4)
Chloride: 104 mmol/L (ref 98–110)
GFR, EST NON AFRICAN AMERICAN: 46 mL/min — AB (ref >=60)
GFR, Est African American: 54 mL/min — ABNORMAL LOW (ref >=60)
Glucose, Bld: 108 mg/dL — ABNORMAL HIGH (ref 65–99)
Potassium: 4.6 mmol/L (ref 3.5–5.3)
SODIUM: 140 mmol/L (ref 135–146)
TOTAL PROTEIN: 8 g/dL (ref 6.1–8.1)

## 2016-12-04 LAB — LIPID PANEL
CHOLESTEROL: 138 mg/dL (ref <200)
HDL: 43 mg/dL — AB (ref >50)
LDL Cholesterol: 85 mg/dL (ref <100)
Total CHOL/HDL Ratio: 3.2 Ratio (ref <5.0)
Triglycerides: 50 mg/dL (ref <150)
VLDL: 10 mg/dL (ref <30)

## 2016-12-04 LAB — HEPATITIS C ANTIBODY: HCV Ab: NEGATIVE

## 2016-12-04 NOTE — Patient Instructions (Addendum)
Goals 1. No sods or juice 2. Keep drinking water 3. Don't skip meals 3. Eat 2- 3 carb choices per meal 4. Look into the [facility] for scholarship for water aerobics.

## 2016-12-04 NOTE — Progress Notes (Signed)
Diabetes Self-Management Education  Visit Type:     Appt. Start Time: 1000 Appt. End Time: 1030  12/04/2016  CHanges made: Eating better meals now. Exersing and walking some  now. Lost 1 lb. She got her blood work done today. Sees Dr. Moshe Cipro tomorrow. Feels better. Says she is suppose to stop taking her Trajenta after she finishes this bottle. Currently on Trajenta and Glipizide 10 mg daily. Not testing blood sugars unless she does not feel good. Drinking more water. Skips meals at times due to not being hungry but then craves sweets at next meal; soda.   Is stressed over paying her bills at times. Limited exercise due to problems with her knees.GOing to be getting physical therapy next week for them.    She is eating better quality of foods overall. Encouraged to avoid processed foods..  Limited ability to read. Use mostly visual education tools and plate method.   ASSESSMENT  Height 5' (1.524 m), weight 194 lb (88 kg). Body mass index is 37.89 kg/m.     Learning Objective:  Patient will have a greater understanding of diabetes self-management. Patient education plan is to attend individual and/or group sessions per assessed needs and concerns.   Plan:  Goals 1. No sods or juice 2. Keep drinking water 3. Don't skip meals 3. Eat 2- 3 carb choices per meal 4. Look into the [facility] for scholarship for water aerobics.   Expected Outcomes:    Weight loss and lower blood sugars.  Education material provided: Meal plan card and My Plate  If problems or questions, patient to contact team via:  Phone and Email  Future DSME appointment: -    Would consider stopping Glipizide when possible to help with more weight loss.

## 2016-12-05 ENCOUNTER — Ambulatory Visit (HOSPITAL_COMMUNITY): Payer: Medicare Other | Attending: Orthopedic Surgery | Admitting: Physical Therapy

## 2016-12-05 DIAGNOSIS — R2689 Other abnormalities of gait and mobility: Secondary | ICD-10-CM | POA: Diagnosis not present

## 2016-12-05 DIAGNOSIS — G8929 Other chronic pain: Secondary | ICD-10-CM | POA: Diagnosis not present

## 2016-12-05 DIAGNOSIS — M545 Low back pain: Secondary | ICD-10-CM | POA: Insufficient documentation

## 2016-12-05 DIAGNOSIS — M25561 Pain in right knee: Secondary | ICD-10-CM | POA: Insufficient documentation

## 2016-12-05 DIAGNOSIS — M25562 Pain in left knee: Secondary | ICD-10-CM | POA: Diagnosis not present

## 2016-12-05 LAB — VITAMIN D 25 HYDROXY (VIT D DEFICIENCY, FRACTURES): VIT D 25 HYDROXY: 43 ng/mL (ref 30–100)

## 2016-12-05 NOTE — Therapy (Signed)
[place], [place], [address] Phone: [phone]   Fax:  [phone]  Physical Therapy Treatment  Patient Details  Name: Kelli Spencer MRN: [id number] Date of Birth: 01/06/1966 Referring Provider: Arther Abbott, MD  Encounter Date: 12/05/2016      PT End of Session - 12/05/16 1440    Visit Number 2   Number of Visits 8   Date for PT Re-Evaluation 12/30/16   Authorization Type UHC Medicare   Authorization Time Period 12/02/16 to 12/30/16   PT Start Time 1431   PT Stop Time 1515   PT Time Calculation (min) 44 min   Activity Tolerance Patient tolerated treatment well;No increased pain   Behavior During Therapy WFL for tasks assessed/performed      Past Medical History:  Diagnosis Date  . Arthritis   . Diabetes mellitus without complication ([place])   . Diastolic hypertension   . Hyperlipidemia   . Hypertension   . Migraines   . Obesity     Past Surgical History:  Procedure Laterality Date  . ABDOMINAL HYSTERECTOMY  1997   fibroids  . COLONOSCOPY N/A 06/23/2016   Procedure: COLONOSCOPY;  Surgeon: Danie Binder, MD;  [facility];  Service: Endoscopy;  Laterality: N/A;  11:30 AM  . KNEE ARTHROSCOPY WITH MEDIAL MENISECTOMY Left 02/09/2014   Procedure: KNEE ARTHROSCOPY WITH MEDIAL MENISECTOMY;  Surgeon: Carole Civil, MD;  Location: [facility];  Service: Orthopedics;  Laterality: Left;  . PARTIAL HYSTERECTOMY      There were no vitals filed for this visit.      Subjective Assessment - 12/05/16 1435    Subjective Pt reports things are going well. Her Rt knee is extra sore currently and there is some swelling she noticed.    Pertinent History DM, HLD, HTN, Lt knee arthroscopy and medial meniscectomy 2015   Limitations Walking;Sitting;Standing;Other (comment)  has to stand with knees slightly bent    Diagnostic tests none    Patient Stated Goals decrease pain.    Currently in Pain? No/denies   None sitting here, but 8/10 when walking                         [facility] Adult PT Treatment/Exercise - 12/05/16 0001      Knee/Hip Exercises: Stretches   Sports administrator Both;3 reps;20 seconds   Quad Stretch Limitations prone, therapist over pressure    Hip Flexor Stretch Both;3 reps;20 seconds   Hip Flexor Stretch Limitations contralateral hip flexion     Knee/Hip Exercises: Seated   Long Arc Quad Both;20 reps;1 set   [employer] Weight 3 lbs.     Knee/Hip Exercises: Supine   Straight Leg Raises Both;10 reps;2 sets     Knee/Hip Exercises: Sidelying   Clams x15 reps each with green TB     Knee/Hip Exercises: Prone   Hamstring Curl 2 sets;10 reps   Hamstring Curl Limitations green TB   Hip Extension Both;2 sets;10 reps   Hip Extension Limitations straight leg      Manual Therapy   Manual Therapy Soft tissue mobilization;Joint mobilization;Edema management   Manual therapy comments separate rest of session   Edema Management retrograde massage to Rt knee    Joint Mobilization Rt grade II/III medial/lateral Rt patellar mobs; seated B medial rotation mobs, grade III with 3# ankle weight    Soft tissue mobilization STM Lt quadricep  PT Education - 12/05/16 1517    Education provided Yes   Education Details updated HEP; technique with therex    Person(s) Educated Patient   Methods Explanation;Verbal cues;Handout   Comprehension Verbalized understanding;Returned demonstration          PT Short Term Goals - 12/02/16 1745      PT SHORT TERM GOAL #1   Title Pt will demo consistency and independence with her HEP to improve strength and activity tolerance    Time 2   Period Weeks   Status New           PT Long Term Goals - 12/02/16 1746      PT LONG TERM GOAL #1   Title Pt will demo imrpoved hip strength to 5/5 MMT, in order to improve performance of functional tasks.    Time 4   Period Weeks   Status New     PT LONG TERM  GOAL #2   Title Pt will demo improved functional strength and activity tolerance evident by her ability to perform 5x sit to stand in less than 10 sec without use of her UE.    Time 4   Period Weeks   Status New     PT LONG TERM GOAL #3   Title Pt will maintain single leg balance on each LE up to 15 sec, without LOB, 3/5 trials, to improve safety with stair negotiation.   Time 4   Period Weeks   Status New     PT LONG TERM GOAL #4   Title Pt will demo increased activity tolerance, evident by her report of improved ability to stay on her feet long enough to cook dinner for herself without a rest break.    Time 4   Period Weeks   Status New               Plan - 12/05/16 1518    Clinical Impression Statement Today's session focused on LE strengthening activity and flexibility exercises followed by manual techniques to decrease quadriceps guarding and pain with activity. Added hip flexor stretch to pt's HEP with her returning proper demonstration of setup. Pt reporting muscle fatigue, however no increase in pain following today's session. Encouraged her to bring her knee braces to her next session for further evaluation. She verbalized understanding.    Rehab Potential Good   PT Frequency 2x / week   PT Duration 4 weeks   PT Treatment/Interventions ADLs/Self Care Home Management;Aquatic Therapy;Cryotherapy;Stair training;Gait training;Functional mobility training;Therapeutic activities;Therapeutic exercise;Balance training;Patient/family education;Neuromuscular re-education;Orthotic Fit/Training;Manual techniques;Passive range of motion;Dry needling   PT Next Visit Plan continue with gentle grade mobs to patella, quad stretch, consider knee brace for pain/swelling   PT Home Exercise Plan seated LAQ, supine bridge 2x15 reps    Consulted and Agree with Plan of Care Patient      Patient will benefit from skilled therapeutic intervention in order to improve the following deficits and  impairments:  Abnormal gait, Decreased activity tolerance, Decreased balance, Decreased endurance, Decreased range of motion, Decreased mobility, Difficulty walking, Increased muscle spasms, Impaired flexibility, Postural dysfunction, Decreased strength, Pain, Obesity  Visit Diagnosis: Chronic pain of right knee  Chronic pain of left knee  Other abnormalities of gait and mobility  Low back pain, unspecified back pain laterality, unspecified chronicity, with sciatica presence unspecified     Problem List Patient Active Problem List   Diagnosis Date Noted  . Dyslipidemia, goal LDL below 100 05/05/2016  . CKD (chronic kidney disease)  02/01/2016  . Vitamin D deficiency 01/16/2016  . GERD (gastroesophageal reflux disease) 08/19/2015  . Gout 08/19/2015  . Immunization refused 10/16/2014  . Medial meniscus, posterior horn derangement 02/02/2014  . DM (diabetes mellitus), type 2, uncontrolled ([place]) 06/25/2013  . Welcome to Medicare preventive visit 06/22/2013  . Chronic pain of both knees 03/23/2013  . Patellar tendonitis 03/22/2013  . Patellar tendinitis of right knee 06/16/2012  . Essential hypertension 04/30/2011  . Obesity 02/10/2008   3:33 PM,12/05/16 Elly Modena PT, DPT [facility] [place] [address] [place], [place], [address] Phone: [phone]   Fax:  [phone]  Name: Kelli Spencer MRN: [id number] Date of Birth: [birth year]

## 2016-12-09 ENCOUNTER — Ambulatory Visit (HOSPITAL_COMMUNITY): Payer: Medicare Other

## 2016-12-09 DIAGNOSIS — M25561 Pain in right knee: Secondary | ICD-10-CM | POA: Diagnosis not present

## 2016-12-09 DIAGNOSIS — M545 Low back pain: Secondary | ICD-10-CM | POA: Diagnosis not present

## 2016-12-09 DIAGNOSIS — G8929 Other chronic pain: Secondary | ICD-10-CM | POA: Diagnosis not present

## 2016-12-09 DIAGNOSIS — R2689 Other abnormalities of gait and mobility: Secondary | ICD-10-CM

## 2016-12-09 DIAGNOSIS — M25562 Pain in left knee: Secondary | ICD-10-CM | POA: Diagnosis not present

## 2016-12-09 NOTE — Therapy (Signed)
[place], [place], [address] Phone: [phone]   Fax:  [phone]  Physical Therapy Treatment  Patient Details  Name: Kelli Spencer MRN: [id number] Date of Birth: Jun 24, 1966 Referring Provider: Arther Abbott, MD  Encounter Date: 12/09/2016      PT End of Session - 12/09/16 1138    Visit Number 3   Number of Visits 8   Date for PT Re-Evaluation 12/30/16   Authorization Type UHC Medicare   Authorization Time Period 12/02/16 to 12/30/16   PT Start Time 1118   PT Stop Time 1200   PT Time Calculation (min) 42 min   Activity Tolerance Patient tolerated treatment well;No increased pain   Behavior During Therapy WFL for tasks assessed/performed      Past Medical History:  Diagnosis Date  . Arthritis   . Diabetes mellitus without complication ([place])   . Diastolic hypertension   . Hyperlipidemia   . Hypertension   . Migraines   . Obesity     Past Surgical History:  Procedure Laterality Date  . ABDOMINAL HYSTERECTOMY  1997   fibroids  . COLONOSCOPY N/A 06/23/2016   Procedure: COLONOSCOPY;  Surgeon: Danie Binder, MD;  [facility];  Service: Endoscopy;  Laterality: N/A;  11:30 AM  . KNEE ARTHROSCOPY WITH MEDIAL MENISECTOMY Left 02/09/2014   Procedure: KNEE ARTHROSCOPY WITH MEDIAL MENISECTOMY;  Surgeon: Carole Civil, MD;  Location: [facility];  Service: Orthopedics;  Laterality: Left;  . PARTIAL HYSTERECTOMY      There were no vitals filed for this visit.      Subjective Assessment - 12/09/16 1121    Subjective Pt stated she has increased pain lately, especially with standing and has some swelling.  Reports compliance with HEP daily   Pertinent History DM, HLD, HTN, Lt knee arthroscopy and medial meniscectomy 2015   Patient Stated Goals decrease pain.    Currently in Pain? --  None with sitting, increases to 6-7/10 with standing   Pain Location Knee   Pain Orientation Right   Pain  Descriptors / Indicators Throbbing   Aggravating Factors  being on her feet too long, sitting stiff for long periods of time and getting up to move around after this   Pain Relieving Factors resting   Effect of Pain on Daily Activities limited activity tolerance                         [facility] Adult PT Treatment/Exercise - 12/09/16 0001      Knee/Hip Exercises: Stretches   Active Hamstring Stretch 3 reps;30 seconds   Active Hamstring Stretch Limitations supine with rope   Quad Stretch Both;3 reps;20 seconds   Quad Stretch Limitations prone, therapist over pressure      Knee/Hip Exercises: Seated   Long Arc Quad Both;20 reps;1 set   [employer] Weight 3 lbs.     Knee/Hip Exercises: Supine   Straight Leg Raises Both;15 reps     Knee/Hip Exercises: Sidelying   Clams x15 reps each with green TB     Knee/Hip Exercises: Prone   Hamstring Curl 2 sets;10 reps   Hamstring Curl Limitations green TB   Hip Extension Both;2 sets;10 reps   Hip Extension Limitations straight leg pillow under hips for support     Manual Therapy   Manual Therapy Soft tissue mobilization;Joint mobilization;Edema management   Manual therapy comments separate rest of session   Edema Management  retrograde massage to Rt knee    Joint Mobilization Rt grade II/III medial/lateral Rt patellar mobs; seated B medial rotation mobs, grade III with 3# ankle weight    Soft tissue mobilization STM Lt quadricep     Heel to toe gait mechanics educated through session             PT Short Term Goals - 12/02/16 1745      PT SHORT TERM GOAL #1   Title Pt will demo consistency and independence with her HEP to improve strength and activity tolerance    Time 2   Period Weeks   Status New           PT Long Term Goals - 12/02/16 1746      PT LONG TERM GOAL #1   Title Pt will demo imrpoved hip strength to 5/5 MMT, in order to improve performance of functional tasks.    Time 4   Period Weeks    Status New     PT LONG TERM GOAL #2   Title Pt will demo improved functional strength and activity tolerance evident by her ability to perform 5x sit to stand in less than 10 sec without use of her UE.    Time 4   Period Weeks   Status New     PT LONG TERM GOAL #3   Title Pt will maintain single leg balance on each LE up to 15 sec, without LOB, 3/5 trials, to improve safety with stair negotiation.   Time 4   Period Weeks   Status New     PT LONG TERM GOAL #4   Title Pt will demo increased activity tolerance, evident by her report of improved ability to stay on her feet long enough to cook dinner for herself without a rest break.    Time 4   Period Weeks   Status New               Plan - 12/09/16 1140    Clinical Impression Statement Session focus on improving LE proximal strengthening and flexibilty incorporating manual technqiues to address edema for pain control.  Pt brought in her knee braces this session, therapist examinated and found inappropriate for knee support or compression.  Educated pt on benefits of compression hose and sent in referral for thigh high compression hose.  Pt verbalized understanding.  EOS pt reprts pain reduced to 5/10 with standing, pt was instructed proper gait mechanics as she ambulated with knees stiff and 1 min LOB episode requiring assistance to reduce risk of fall.     Rehab Potential Good   PT Frequency 2x / week   PT Duration 4 weeks   PT Treatment/Interventions ADLs/Self Care Home Management;Aquatic Therapy;Cryotherapy;Stair training;Gait training;Functional mobility training;Therapeutic activities;Therapeutic exercise;Balance training;Patient/family education;Neuromuscular re-education;Orthotic Fit/Training;Manual techniques;Passive range of motion;Dry needling   PT Next Visit Plan continue with gentle grade mobs to patella, quad stretch, consider knee brace for pain/swelling   PT Home Exercise Plan seated LAQ, supine bridge 2x15 reps     Recommended Other Services 12/09/2016 referral sent to MD for compression hose      Patient will benefit from skilled therapeutic intervention in order to improve the following deficits and impairments:  Abnormal gait, Decreased activity tolerance, Decreased balance, Decreased endurance, Decreased range of motion, Decreased mobility, Difficulty walking, Increased muscle spasms, Impaired flexibility, Postural dysfunction, Decreased strength, Pain, Obesity  Visit Diagnosis: Chronic pain of right knee  Chronic pain of left knee  Other abnormalities of  gait and mobility     Problem List Patient Active Problem List   Diagnosis Date Noted  . Dyslipidemia, goal LDL below 100 05/05/2016  . CKD (chronic kidney disease) 02/01/2016  . Vitamin D deficiency 01/16/2016  . GERD (gastroesophageal reflux disease) 08/19/2015  . Gout 08/19/2015  . Immunization refused 10/16/2014  . Medial meniscus, posterior horn derangement 02/02/2014  . DM (diabetes mellitus), type 2, uncontrolled ([place]) 06/25/2013  . Welcome to Medicare preventive visit 06/22/2013  . Chronic pain of both knees 03/23/2013  . Patellar tendonitis 03/22/2013  . Patellar tendinitis of right knee 06/16/2012  . Essential hypertension 04/30/2011  . Obesity 02/10/2008   Ihor Austin, [place]; [place]  Aldona Lento 12/09/2016, 12:11 PM  [place] [address] [place], [place], [address] Phone: [phone]   Fax:  [phone]  Name: Kelli Spencer MRN: [id number] Date of Birth: 26-May-1966

## 2016-12-10 ENCOUNTER — Ambulatory Visit (INDEPENDENT_AMBULATORY_CARE_PROVIDER_SITE_OTHER): Payer: Medicare Other | Admitting: Family Medicine

## 2016-12-10 ENCOUNTER — Encounter: Payer: Self-pay | Admitting: Family Medicine

## 2016-12-10 VITALS — BP 128/80 | HR 68 | Temp 98.5°F | Resp 16 | Ht 60.0 in | Wt 197.1 lb

## 2016-12-10 DIAGNOSIS — IMO0001 Reserved for inherently not codable concepts without codable children: Secondary | ICD-10-CM

## 2016-12-10 DIAGNOSIS — E669 Obesity, unspecified: Secondary | ICD-10-CM

## 2016-12-10 DIAGNOSIS — Z2821 Immunization not carried out because of patient refusal: Secondary | ICD-10-CM

## 2016-12-10 DIAGNOSIS — M25561 Pain in right knee: Secondary | ICD-10-CM

## 2016-12-10 DIAGNOSIS — E118 Type 2 diabetes mellitus with unspecified complications: Secondary | ICD-10-CM

## 2016-12-10 DIAGNOSIS — M25562 Pain in left knee: Secondary | ICD-10-CM

## 2016-12-10 DIAGNOSIS — Z1231 Encounter for screening mammogram for malignant neoplasm of breast: Secondary | ICD-10-CM | POA: Diagnosis not present

## 2016-12-10 DIAGNOSIS — E785 Hyperlipidemia, unspecified: Secondary | ICD-10-CM

## 2016-12-10 DIAGNOSIS — E1169 Type 2 diabetes mellitus with other specified complication: Secondary | ICD-10-CM | POA: Diagnosis not present

## 2016-12-10 DIAGNOSIS — G8929 Other chronic pain: Secondary | ICD-10-CM

## 2016-12-10 DIAGNOSIS — E1165 Type 2 diabetes mellitus with hyperglycemia: Secondary | ICD-10-CM

## 2016-12-10 DIAGNOSIS — I10 Essential (primary) hypertension: Secondary | ICD-10-CM | POA: Diagnosis not present

## 2016-12-10 DIAGNOSIS — E6609 Other obesity due to excess calories: Secondary | ICD-10-CM

## 2016-12-10 DIAGNOSIS — Z1239 Encounter for other screening for malignant neoplasm of breast: Secondary | ICD-10-CM

## 2016-12-10 MED ORDER — HYDROXYZINE HCL 50 MG PO TABS: ORAL_TABLET | ORAL | 0 refills | Status: DC

## 2016-12-10 NOTE — Assessment & Plan Note (Signed)
Controlled, no change in medication Hyperlipidemia:Low fat diet discussed and encouraged.   Lipid Panel  Lab Results  Component Value Date   CHOL 138 12/04/2016   HDL 43 (L) 12/04/2016   LDLCALC 85 12/04/2016   TRIG 50 12/04/2016   CHOLHDL 3.2 12/04/2016   Increased exercise as able encouraged

## 2016-12-10 NOTE — Assessment & Plan Note (Signed)
Currently in pT and being treated by ortho, advised d/c diclofenac due to CKD

## 2016-12-10 NOTE — Assessment & Plan Note (Addendum)
Controlled, no change in medication Kelli Spencer is reminded of the importance of commitment to daily physical activity for 30 minutes or more, as able and the need to limit carbohydrate intake to 30 to 60 grams per meal to help with blood sugar control.   The need to take medication as prescribed, test blood sugar as directed, and to call between visits if there is a concern that blood sugar is uncontrolled is also discussed.  Goal is 6.5 or less, needs to work on diet and exercise , med change not indicated  Kelli Spencer is reminded of the importance of daily foot exam, annual eye examination, and good blood sugar, blood pressure and cholesterol control.  Diabetic Labs Latest Ref Rng & Units 12/04/2016 07/21/2016 05/05/2016 04/07/2016 01/16/2016  HbA1c <5.7 % 7.0(H) 6.4(H) - 7.8(H) 8.8(H)  Microalbumin Not Estab. ug/mL - - <3.0(H) - -  Micro/Creat Ratio 0.0 - 30.0 mg/g creat - - <2.8 - -  Chol <200 mg/dL 138 121(L) - - 178  HDL >50 mg/dL 43(L) 42(L) - - 44(L)  Calc LDL <100 mg/dL 85 69 - - 115  Triglycerides <150 mg/dL 50 50 - - 95  Creatinine 0.50 - 1.05 mg/dL 1.32(H) 1.11(H) - 1.37(H) 1.40(H)   BP/Weight 12/10/2016 12/04/2016 10/07/2016 08/28/2016 08/25/2016 08/21/2016 [id number]  Systolic BP 0000000 - 123XX123 - 123456 123456 93  Diastolic BP 80 - 82 - 69 80 50  Wt. (Lbs) 197.08 194 194.12 195 195 196 188  BMI 38.49 37.89 37.91 38.08 38.08 38.28 36.72   Foot/eye exam completion dates 08/21/2016 08/15/2015  Foot Form Completion Done Done

## 2016-12-10 NOTE — Assessment & Plan Note (Signed)
Deteriorated. Patient re-educated about  the importance of commitment to a  minimum of 150 minutes of exercise per week.  The importance of healthy food choices with portion control discussed. Encouraged to start a food diary, count calories and to consider  joining a support group. Sample diet sheets offered. Goals set by the patient for the next several months.   Weight /BMI 12/10/2016 12/04/2016 10/07/2016  WEIGHT 197 lb 1.3 oz 194 lb 194 lb 1.9 oz  HEIGHT 5\' 0"  5\' 0"  5\' 0"   BMI 38.49 kg/m2 37.89 kg/m2 37.91 kg/m2

## 2016-12-10 NOTE — Assessment & Plan Note (Signed)
.  conq1 DASH diet and commitment to daily physical activity for a minimum of 30 minutes discussed and encouraged, as a part of hypertension management. The importance of attaining a healthy weight is also discussed.  BP/Weight 12/10/2016 12/04/2016 10/07/2016 08/28/2016 08/25/2016 08/21/2016 [id number]  Systolic BP 0000000 - 123XX123 - 123456 123456 93  Diastolic BP 80 - 82 - 69 80 50  Wt. (Lbs) 197.08 194 194.12 195 195 196 188  BMI 38.49 37.89 37.91 38.08 38.08 38.28 36.72

## 2016-12-10 NOTE — Progress Notes (Signed)
Kelli Spencer     MRN: [id number]      DOB: [birth year]   HPI Kelli Spencer is here for follow up and re-evaluation of chronic medical conditions, medication management and review of any available recent lab and radiology data.  Preventive health is updated, specifically  Cancer screening , needs updating, refuses all immunization Questions or concerns regarding consultations or procedures which the PT has had in the interim are  addressed. The PT denies any adverse reactions to current medications since the last visit.  Currently receiving  PT for bilateral osteoarthritis of the knees, doesn't think it's helping but will persevere. C/o increased stress and anxiety , worried about bills , hydroxyzine helpful, and wants this back Denies polyuria, polydipsia, blurred vision , or hypoglycemic episodes.   ROS Denies recent fever or chills. Denies sinus pressure, nasal congestion, ear pain or sore throat. Denies chest congestion, productive cough or wheezing. Denies chest pains, palpitations and leg swelling Denies abdominal pain, nausea, vomiting,diarrhea or constipation.   Denies dysuria, frequency, hesitancy or incontinence. Denies headaches, seizures, numbness, or tingling. Denies depression, c/o mild anxiety and  insomnia. Denies skin break down or rash.   PE  BP 128/80 (BP Location: Left Arm, Patient Position: Sitting, Cuff Size: Normal)   Pulse 68   Temp 98.5 F (36.9 C) (Oral)   Resp 16   Ht 5' (1.524 m)   Wt 197 lb 1.3 oz (89.4 kg)   LMP 06/10/1988 (Approximate)   SpO2 97%   BMI 38.49 kg/m   Patient alert and oriented and in no cardiopulmonary distress.  HEENT: No facial asymmetry, EOMI,   oropharynx pink and moist.  Neck supple no JVD, no mass.  Chest: Clear to auscultation bilaterally.  CVS: S1, S2 no murmurs, no S3.Regular rate.  ABD: Soft non tender.   Ext: No edema   reduced in  knees.  Skin: Intact, no ulcerations or rash noted.  Psych: Good eye  contact, normal affect. Memory intact not anxious or depressed appearing.  CNS: CN 2-12 intact, power,  normal throughout.no focal deficits noted.   Assessment & Plan  Essential hypertension .conq1 DASH diet and commitment to daily physical activity for a minimum of 30 minutes discussed and encouraged, as a part of hypertension management. The importance of attaining a healthy weight is also discussed.  BP/Weight 12/10/2016 12/04/2016 10/07/2016 08/28/2016 08/25/2016 08/21/2016 [id number]  Systolic BP 0000000 - 123XX123 - 123456 123456 93  Diastolic BP 80 - 82 - 69 80 50  Wt. (Lbs) 197.08 194 194.12 195 195 196 188  BMI 38.49 37.89 37.91 38.08 38.08 38.28 36.72       Diabetes mellitus type 2 in obese (HCC) Controlled, no change in medication Kelli Spencer is reminded of the importance of commitment to daily physical activity for 30 minutes or more, as able and the need to limit carbohydrate intake to 30 to 60 grams per meal to help with blood sugar control.   The need to take medication as prescribed, test blood sugar as directed, and to call between visits if there is a concern that blood sugar is uncontrolled is also discussed.  Goal is 6.5 or less, needs to work on diet and exercise , med change not indicated  Kelli Spencer is reminded of the importance of daily foot exam, annual eye examination, and good blood sugar, blood pressure and cholesterol control.  Diabetic Labs Latest Ref Rng & Units 12/04/2016 07/21/2016 05/05/2016 04/07/2016 01/16/2016  HbA1c <5.7 % 7.0(H) 6.4(H) -  7.8(H) 8.8(H)  Microalbumin Not Estab. ug/mL - - <3.0(H) - -  Micro/Creat Ratio 0.0 - 30.0 mg/g creat - - <2.8 - -  Chol <200 mg/dL 138 121(L) - - 178  HDL >50 mg/dL 43(L) 42(L) - - 44(L)  Calc LDL <100 mg/dL 85 69 - - 115  Triglycerides <150 mg/dL 50 50 - - 95  Creatinine 0.50 - 1.05 mg/dL 1.32(H) 1.11(H) - 1.37(H) 1.40(H)   BP/Weight 12/10/2016 12/04/2016 10/07/2016 08/28/2016 08/25/2016 08/21/2016 [id number]  Systolic BP 0000000 - 123XX123 - 123456  123456 93  Diastolic BP 80 - 82 - 69 80 50  Wt. (Lbs) 197.08 194 194.12 195 195 196 188  BMI 38.49 37.89 37.91 38.08 38.08 38.28 36.72   Foot/eye exam completion dates 08/21/2016 08/15/2015  Foot Form Completion Done Done        Obesity Deteriorated. Patient re-educated about  the importance of commitment to a  minimum of 150 minutes of exercise per week.  The importance of healthy food choices with portion control discussed. Encouraged to start a food diary, count calories and to consider  joining a support group. Sample diet sheets offered. Goals set by the patient for the next several months.   Weight /BMI 12/10/2016 12/04/2016 10/07/2016  WEIGHT 197 lb 1.3 oz 194 lb 194 lb 1.9 oz  HEIGHT 5\' 0"  5\' 0"  5\' 0"   BMI 38.49 kg/m2 37.89 kg/m2 37.91 kg/m2      Immunization refused Re educated re vaccine benefit, refuses all  Dyslipidemia, goal LDL below 100 Controlled, no change in medication Hyperlipidemia:Low fat diet discussed and encouraged.   Lipid Panel  Lab Results  Component Value Date   CHOL 138 12/04/2016   HDL 43 (L) 12/04/2016   LDLCALC 85 12/04/2016   TRIG 50 12/04/2016   CHOLHDL 3.2 12/04/2016   Increased exercise as able encouraged    Chronic pain of both knees Currently in pT and being treated by ortho, advised d/c diclofenac due to CKD

## 2016-12-10 NOTE — Assessment & Plan Note (Signed)
Re educated re vaccine benefit, refuses all

## 2016-12-10 NOTE — Patient Instructions (Addendum)
Annual physical exam in 5 month,call if you need me before  Mammogram is past due please get appointment at checkout if possible  HBa1C and chem 7 and EGFR and CBC non fast in 5 month  Hydroxyzine sent in for itching and anxiety It is important that you exercise regularly at least 30 minutes 5 times a week. If you develop chest pain, have severe difficulty breathing, or feel very tired, stop exercising immediately and seek medical attention    Please work on good  health habits so that your health will improve. 1. Commitment to daily physical activity for 30 to 60  minutes, if you are able to do this.  2. Commitment to wise food choices. Aim for half of your  food intake to be vegetable and fruit, one quarter starchy foods, and one quarter protein. Try to eat on a regular schedule  3 meals per day, snacking between meals should be limited to vegetables or fruits or small portions of nuts. 64 ounces of water per day is generally recommended, unless you have specific health conditions, like heart failure or kidney failure where you will need to limit fluid intake.  3. Commitment to sufficient and a  good quality of physical and mental rest daily, generally between 6 to 8 hours per day.  WITH PERSISTANCE AND PERSEVERANCE, THE IMPOSSIBLE , BECOMES THE NORM!

## 2016-12-12 ENCOUNTER — Ambulatory Visit (HOSPITAL_COMMUNITY): Payer: Medicare Other | Admitting: Physical Therapy

## 2016-12-12 DIAGNOSIS — M25561 Pain in right knee: Secondary | ICD-10-CM

## 2016-12-12 DIAGNOSIS — R2689 Other abnormalities of gait and mobility: Secondary | ICD-10-CM | POA: Diagnosis not present

## 2016-12-12 DIAGNOSIS — M25562 Pain in left knee: Principal | ICD-10-CM

## 2016-12-12 DIAGNOSIS — G8929 Other chronic pain: Secondary | ICD-10-CM | POA: Diagnosis not present

## 2016-12-12 DIAGNOSIS — M545 Low back pain: Secondary | ICD-10-CM

## 2016-12-12 NOTE — Therapy (Signed)
[place], [place], [address] Phone: [phone]   Fax:  [phone]  Physical Therapy Treatment/Discharge  Patient Details  Name: Kelli Spencer MRN: [id number] Date of Birth: 1966/02/26 Referring Provider: Arther Abbott, MD  Encounter Date: 12/12/2016      PT End of Session - 12/12/16 1417    Visit Number 4   Number of Visits 8   Date for PT Re-Evaluation 12/30/16   Authorization Type [employer] Medicare   Authorization Time Period 12/02/16 to 12/30/16   PT Start Time 1347   PT Stop Time 1427   PT Time Calculation (min) 40 min   Activity Tolerance Patient tolerated treatment well;No increased pain   Behavior During Therapy WFL for tasks assessed/performed      Past Medical History:  Diagnosis Date  . Arthritis   . Diabetes mellitus without complication ([place])   . Diastolic hypertension   . Hyperlipidemia   . Hypertension   . Migraines   . Obesity     Past Surgical History:  Procedure Laterality Date  . ABDOMINAL HYSTERECTOMY  1997   fibroids  . COLONOSCOPY N/A 06/23/2016   Procedure: COLONOSCOPY;  Surgeon: Danie Binder, MD;  [facility];  Service: Endoscopy;  Laterality: N/A;  11:30 AM  . KNEE ARTHROSCOPY WITH MEDIAL MENISECTOMY Left 02/09/2014   Procedure: KNEE ARTHROSCOPY WITH MEDIAL MENISECTOMY;  Surgeon: Carole Civil, MD;  Location: [facility];  Service: Orthopedics;  Laterality: Left;  . PARTIAL HYSTERECTOMY      There were no vitals filed for this visit.      Subjective Assessment - 12/12/16 1349    Subjective Pt states her pain continues to bother her. She will have sharp and aching pain across the front of her knee.    Pertinent History DM, HLD, HTN, Lt knee arthroscopy and medial meniscectomy 2015   Patient Stated Goals decrease pain.    Currently in Pain? Other (Comment)  None sitting, but has pain when she is standing                          [facility]  PT Treatment/Exercise - 12/12/16 0001      Knee/Hip Exercises: Stretches   Gastroc Stretch Right;3 reps;20 seconds   Gastroc Stretch Limitations slantboard      Knee/Hip Exercises: Seated   Hamstring Curl Right;2 sets;15 reps   Hamstring Limitations green TB     Knee/Hip Exercises: Sidelying   Clams 2x10 reps with green TB each side.      Knee/Hip Exercises: Prone   Straight Leg Raises Both;2 sets;15 reps     Manual Therapy   Manual therapy comments separate rest of session   Edema Management ice application during STM, retrograde massage to Rt knee    Joint Mobilization Grade 1/2 lateral and medial Rt patellar mobs    Soft tissue mobilization Rt adductors/vastus lateralis/quadriceps                PT Education - 12/12/16 1413    Education provided Yes   Education Details importance of continuing with PT for another 1-2 weeks to ensure a good effort has been made and allow time for changes to happen in strength/etc.    Person(s) Educated Patient   Methods Explanation   Comprehension Verbalized understanding          PT Short Term Goals - 12/12/16 1525      PT  SHORT TERM GOAL #1   Title Pt will demo consistency and independence with her HEP to improve strength and activity tolerance    Time 2   Period Weeks   Status Partially Met           PT Long Term Goals - 01-03-2017 1526      PT LONG TERM GOAL #1   Title Pt will demo imrpoved hip strength to 5/5 MMT, in order to improve performance of functional tasks.    Time 4   Period Weeks   Status Unable to assess     PT LONG TERM GOAL #2   Title Pt will demo improved functional strength and activity tolerance evident by her ability to perform 5x sit to stand in less than 10 sec without use of her UE.    Time 4   Period Weeks   Status Unable to assess     PT LONG TERM GOAL #3   Title Pt will maintain single leg balance on each LE up to 15 sec, without LOB, 3/5 trials, to improve safety with stair  negotiation.   Time 4   Period Weeks   Status Unable to assess     PT LONG TERM GOAL #4   Title Pt will demo increased activity tolerance, evident by her report of improved ability to stay on her feet long enough to cook dinner for herself without a rest break.    Time 4   Period Weeks   Status Unable to assess               Plan - 2017-01-03 1420    Clinical Impression Statement Continued this session with manual techniques to address pain and muscle spasm of the RLE. Pt continues to report significant levels of Rt knee/patellar pain with weight bearing activity. She reports fairly consistent HEP adherence and an order was sent at her last session for a compression garment to assist with swelling. She arrived today requesting this be her last session. Therapist recommended against this, encouraging the pt continue with her established POC for a few more visits considering 3 visits over the course of 1-2 weeks is not enough to see true changes in strength and mobility. Despite this, pt is requesting this be her last session. At this time, she will be discharged from PT per her request.   Rehab Potential Good   PT Frequency 2x / week   PT Duration 4 weeks   PT Treatment/Interventions ADLs/Self Care Home Management;Aquatic Therapy;Cryotherapy;Stair training;Gait training;Functional mobility training;Therapeutic activities;Therapeutic exercise;Balance training;Patient/family education;Neuromuscular re-education;Orthotic Fit/Training;Manual techniques;Passive range of motion;Dry needling   PT Next Visit Plan d/c this visit    PT Home Exercise Plan seated LAQ, supine bridge 2x15 reps    Consulted and Agree with Plan of Care Patient      Patient will benefit from skilled therapeutic intervention in order to improve the following deficits and impairments:  Abnormal gait, Decreased activity tolerance, Decreased balance, Decreased endurance, Decreased range of motion, Decreased mobility,  Difficulty walking, Increased muscle spasms, Impaired flexibility, Postural dysfunction, Decreased strength, Pain, Obesity  Visit Diagnosis: Chronic pain of left knee  Chronic pain of right knee  Other abnormalities of gait and mobility  Low back pain, unspecified back pain laterality, unspecified chronicity, with sciatica presence unspecified       G-Codes - 2017/01/03 1526    Functional Assessment Tool Used clinical judgement based on current functional level, activity tolerance and pt subjective report of pain   Functional  Limitation Mobility: Walking and moving around   Mobility: Walking and Moving Around Goal Status [phone]) At least 40 percent but less than 60 percent impaired, limited or restricted   Mobility: Walking and Moving Around Discharge Status [phone]) At least 40 percent but less than 60 percent impaired, limited or restricted      Problem List Patient Active Problem List   Diagnosis Date Noted  . Dyslipidemia, goal LDL below 100 05/05/2016  . CKD (chronic kidney disease) 02/01/2016  . Vitamin D deficiency 01/16/2016  . GERD (gastroesophageal reflux disease) 08/19/2015  . Gout 08/19/2015  . Immunization refused 10/16/2014  . Medial meniscus, posterior horn derangement 02/02/2014  . Diabetes mellitus type 2 in obese ([place]) 06/25/2013  . Chronic pain of both knees 03/23/2013  . Patellar tendonitis 03/22/2013  . Patellar tendinitis of right knee 06/16/2012  . Essential hypertension 04/30/2011  . Obesity 02/10/2008   PHYSICAL THERAPY DISCHARGE SUMMARY  Visits from Start of Care: 3  Current functional level related to goals / functional outcome Pt only attended 3 treatment sessions since her evaluation, limiting progress towards goals and functional outcomes.    Remaining deficits: B knee pain Rt>Lt, muscle spasm and guarding throughout the Rt knee; poor activity tolerance reported    Education / Equipment: Importance of continuing with PT for another 1-2  weeks to ensure a good effort has been made and allow time for changes to happen in strength/etc. Pt was unwilling to continue, reporting she had already been dealing with this for so long, she was just going to have to keep dealing with it.  Plan: Patient agrees to discharge.  Patient goals were not met. Patient is being discharged due to the patient's request.  ?????       3:32 PM,12/12/16 Elly Modena PT, DPT [facility] [place], [place], [address] Phone: [phone]   Fax:  [phone]  Name: Kelli Spencer MRN: [id number] Date of Birth: 03-Mar-1966

## 2016-12-16 ENCOUNTER — Ambulatory Visit (HOSPITAL_COMMUNITY): Payer: Medicare Other

## 2016-12-18 ENCOUNTER — Ambulatory Visit (HOSPITAL_COMMUNITY)
Admission: RE | Admit: 2016-12-18 | Discharge: 2016-12-18 | Disposition: A | Discharge status: Home / Self Care | Payer: Medicare Other | Source: Ambulatory Visit | Attending: Family Medicine | Admitting: Family Medicine

## 2016-12-18 DIAGNOSIS — Z1239 Encounter for other screening for malignant neoplasm of breast: Secondary | ICD-10-CM

## 2016-12-18 DIAGNOSIS — Z1231 Encounter for screening mammogram for malignant neoplasm of breast: Secondary | ICD-10-CM | POA: Insufficient documentation

## 2016-12-19 ENCOUNTER — Ambulatory Visit (HOSPITAL_COMMUNITY): Payer: Medicare Other | Admitting: Physical Therapy

## 2016-12-23 ENCOUNTER — Ambulatory Visit (HOSPITAL_COMMUNITY): Payer: Medicare Other

## 2016-12-26 ENCOUNTER — Ambulatory Visit (HOSPITAL_COMMUNITY): Payer: Medicare Other | Admitting: Physical Therapy

## 2016-12-30 ENCOUNTER — Ambulatory Visit (HOSPITAL_COMMUNITY): Payer: Medicare Other | Admitting: Physical Therapy

## 2017-01-02 ENCOUNTER — Encounter (HOSPITAL_COMMUNITY): Payer: Medicare Other | Admitting: Physical Therapy

## 2017-02-23 ENCOUNTER — Telehealth: Payer: Self-pay | Admitting: Orthopedic Surgery

## 2017-02-23 ENCOUNTER — Ambulatory Visit (INDEPENDENT_AMBULATORY_CARE_PROVIDER_SITE_OTHER): Payer: Medicare Other | Admitting: Orthopedic Surgery

## 2017-02-23 ENCOUNTER — Other Ambulatory Visit: Payer: Self-pay | Admitting: *Deleted

## 2017-02-23 ENCOUNTER — Encounter: Payer: Self-pay | Admitting: Orthopedic Surgery

## 2017-02-23 VITALS — Wt 198.0 lb

## 2017-02-23 DIAGNOSIS — M170 Bilateral primary osteoarthritis of knee: Secondary | ICD-10-CM

## 2017-02-23 NOTE — Progress Notes (Signed)
Patient ID: Kelli Spencer, female   DOB: [birth year], 51 y.o.   MRN: [id number]  Chief Complaint  Patient presents with  . Follow-up    bilateral knees and back pain    HPI Kelli Spencer is a 51 y.o. female. Presents with bilateral knee pain. She's had bilateral knee pain now for several years. Her x-rays show significant joint space narrowing medial compartment. She complains of bilateral medial joint line pain which is severe and unrelieved by anti-inflammatories, Ultracet Tylenol with Codeine. She also had injections of cortisone without help.  She also indicates that she is having difficulty getting in and out of the tub getting up and down from a seated position    Review of Systems Review of Systems  Constitutional: Negative for unexpected weight change.  Respiratory: Negative for shortness of breath.   Cardiovascular: Negative for chest pain.  Genitourinary: Negative for difficulty urinating.   (2 MINIMUM)  Past Medical History:  Diagnosis Date  . Arthritis   . Diabetes mellitus without complication ([place])   . Diastolic hypertension   . Hyperlipidemia   . Hypertension   . Migraines   . Obesity     Past Surgical History:  Procedure Laterality Date  . ABDOMINAL HYSTERECTOMY  1997   fibroids  . COLONOSCOPY N/A 06/23/2016   Procedure: COLONOSCOPY;  Surgeon: Danie Binder, MD;  [facility];  Service: Endoscopy;  Laterality: N/A;  11:30 AM  . KNEE ARTHROSCOPY WITH MEDIAL MENISECTOMY Left 02/09/2014   Procedure: KNEE ARTHROSCOPY WITH MEDIAL MENISECTOMY;  Surgeon: Carole Civil, MD;  Location: [facility];  Service: Orthopedics;  Laterality: Left;  . PARTIAL HYSTERECTOMY      Social History Social History  Substance Use Topics  . Smoking status: Never Smoker  . Smokeless tobacco: Never Used  . Alcohol use No    Allergies  Allergen Reactions  . Prednisone Swelling and Other (See Comments)    Made mouth and throat feel numb  . Ace Inhibitors Cough  .  Metformin And Related Diarrhea    Current Meds  Medication Sig  . acetaminophen-codeine (TYLENOL #3) 300-30 MG tablet Take 1 tablet by mouth every 6 (six) hours as needed for moderate pain.  Marland Kitchen albuterol (PROVENTIL HFA;VENTOLIN HFA) 108 (90 Base) MCG/ACT inhaler Inhale 2 puffs into the lungs every 6 (six) hours as needed for wheezing or shortness of breath.  Marland Kitchen amLODipine (NORVASC) 2.5 MG tablet TAKE 1 TABLET BY MOUTH EVERY DAY  . cholecalciferol (VITAMIN D) 400 UNITS TABS tablet Take 800 Units by mouth daily.  Marland Kitchen glipiZIDE (GLUCOTROL) 10 MG tablet One tablet once daily  . glucose blood (ONETOUCH VERIO) test strip Once daily testing  . hydrOXYzine (ATARAX/VISTARIL) 50 MG tablet TAKE ONE TABLET BY MOUTH AT BEDTIME FOR ITCHING AND ANXIETY  . omeprazole (PRILOSEC) 40 MG capsule Take 1 capsule (40 mg total) by mouth 2 (two) times daily.  Glory Rosebush DELICA LANCETS 40X MISC TO USE WITH ONE TOUCH VERIO FLEX, TEST ONCE A DAY  . traMADol-acetaminophen (ULTRACET) 37.5-325 MG tablet Take 1 tablet by mouth every 6 (six) hours as needed.      Physical Exam Physical Exam 1.Wt 198 lb (89.8 kg)   LMP 06/10/1988 (Approximate)   BMI 38.67 kg/m   2. Gen. appearance. The patient is well-developed and well-nourished, grooming and hygiene are normal. There are no gross congenital abnormalities  3. The patient is alert and oriented to person place and time  4. Mood and affect are normal  5. Ambulation Antalgic bilateral gait  Examination reveals the following: 6. On inspection we find bilateral medial joint line tenderness with flexion on the right of 120 and on the left of 118  Both knees so stable anterior and posterior cruciate ligaments  Muscle tone and strength is grade 5 in each quadricep muscle and the skin overlying this right and left knee are normal  We note normal sensation in both lower extremities and no peripheral edema in either leg   Her x-rays show the varus alignment in the  medial joint space narrowing which is very prominent    Either knee could be done first she will call us back and let us know which one we're going to do  Impression bilateral knee osteoarthritis plan bilateral total knee replacements staged 3 months apart   (Add: right knee first )

## 2017-02-23 NOTE — Telephone Encounter (Signed)
SURGERY SCHEDULED °

## 2017-02-23 NOTE — Patient Instructions (Signed)
Call back to advise surgery date   You have decided to proceed with knee replacement surgery. You have decided not to continue with nonoperative measures such as but not limited to oral medication, weight loss, activity modification, physical therapy, bracing, or injection.  We will perform the procedure commonly known as total knee replacement. Some of the risks associated with knee replacement surgery include but are not limited to Bleeding Infection Swelling Stiffness Blood clot Pain that persists even after surgery  Infection is especially devastating complication of knee surgery although rare. If infection does occur your implant will usually have to be removed and several surgeries and antibiotics will be needed to eradicate the infection prior to performing a repeat replacement.   In some cases amputation is required to eradicate the infection. In other rare cases a knee fusion is needed    If you're not comfortable with these risks and would like to continue with nonoperative treatment please let Dr. Aline Brochure know prior to your surgery.

## 2017-02-23 NOTE — Telephone Encounter (Signed)
Patient called back stating her surgery could be scheduled for the  16 or 17, preferably the 16  Please call and advise.

## 2017-03-04 ENCOUNTER — Other Ambulatory Visit: Payer: Self-pay | Admitting: *Deleted

## 2017-03-04 DIAGNOSIS — Z96651 Presence of right artificial knee joint: Secondary | ICD-10-CM

## 2017-03-05 ENCOUNTER — Other Ambulatory Visit: Payer: Self-pay | Admitting: Family Medicine

## 2017-03-05 DIAGNOSIS — E785 Hyperlipidemia, unspecified: Secondary | ICD-10-CM

## 2017-03-05 DIAGNOSIS — E669 Obesity, unspecified: Secondary | ICD-10-CM

## 2017-03-05 DIAGNOSIS — E1169 Type 2 diabetes mellitus with other specified complication: Secondary | ICD-10-CM

## 2017-03-10 ENCOUNTER — Telehealth: Payer: Self-pay | Admitting: Family Medicine

## 2017-03-10 NOTE — Telephone Encounter (Signed)
r/s list - called to r/s 05/12/17 appt, no answer, left voicemail.

## 2017-03-10 NOTE — Patient Instructions (Signed)
Kelli Spencer  03/10/2017     @PREFPERIOPPHARMACY @   Your procedure is scheduled on  03/18/2017   Report to [facility] at  615  A.M.  Call this number if you have problems the morning of surgery:  [phone]   Remember:  Do not eat food or drink liquids after midnight.  Take these medicines the morning of surgery with A SIP OF WATER  Tylenol#3 or ultracet, amlodipine, prilosec. Take your inhaler before you come. Bring your rescue inhaler with you if you have one.   Do not wear jewelry, make-up or nail polish.  Do not wear lotions, powders, or perfumes, or deoderant.  Do not shave 48 hours prior to surgery.  Men may shave face and neck.  Do not bring valuables to the hospital.  [facility] is not responsible for any belongings or valuables.  Contacts, dentures or bridgework may not be worn into surgery.  Leave your suitcase in the car.  After surgery it may be brought to your room.  For patients admitted to the hospital, discharge time will be determined by your treatment team.  Patients discharged the day of surgery will not be allowed to drive home.   Name and phone number of your driver:   family Special instructions:  None  Please read over the following fact sheets that you were given. Pain Booklet, Coughing and Deep Breathing, Blood Transfusion Information, Total Joint Packet, MRSA Information, Surgical Site Infection Prevention, Anesthesia Post-op Instructions and Care and Recovery After Surgery       Total Knee Replacement Total knee replacement is a procedure to replace the knee joint with an artificial (prosthetic) knee joint. The purpose of this surgery is to reduce knee pain and improve knee function. The prosthetic knee joint (prosthesis) is usually made of metal and plastic. It replaces parts of the thigh bone (femur), lower leg bone (tibia), and kneecap (patella) that are removed during the procedure. Tell a health care provider  about:  Any allergies you have.  All medicines you are taking, including vitamins, herbs, eye drops, creams, and over-the-counter medicines.  Any problems you or family members have had with anesthetic medicines.  Any blood disorders you have.  Any surgeries you have had.  Any medical conditions you have.  Whether you are pregnant or may be pregnant. What are the risks? Generally, this is a safe procedure. However, problems may occur, including:  Infection.  Bleeding.  Allergic reactions to medicines.  Damage to other structures or organs.  Decreased range of motion of the knee.  Instability of the knee.  Loosening of the prosthetic joint.  Knee pain that does not go away (chronic pain). What happens before the procedure?  Ask your health care provider about:  Changing or stopping your regular medicines. This is especially important if you are taking diabetes medicines or blood thinners.  Taking medicines such as aspirin and ibuprofen. These medicines can thin your blood. Do not take these medicines before your procedure if your health care provider instructs you not to.  Have dental care and routine cleanings completed before your procedure. Plan to not have dental work done for 3 months after your procedure. Germs from anywhere in your body, including your mouth, can travel to your new joint and infect it.  Follow instructions from your health care provider about eating or drinking restrictions.  Ask your health care provider how your surgical  site will be marked or identified.  You may be given antibiotic medicine to help prevent infection.  If your health care provider prescribes physical therapy, do exercises as instructed.  Do not use any tobacco products, such as cigarettes, chewing tobacco, or e-cigarettes. If you need help quitting, ask your health care provider.  You may have a physical exam.  You may have tests, such as:  X-rays.  MRI.  CT  scan.  Bone scans.  You may have a blood or urine sample taken.  Plan to have someone take you home after the procedure.  If you will be going home right after the procedure, plan to have someone with you for at least 24 hours. It is recommended that you have someone to help care for you for at least 4-6 weeks after your procedure. What happens during the procedure?  To reduce your risk of infection:  Your health care team will wash or sanitize their hands.  Your skin will be washed with soap.  An IV tube will be inserted into one of your veins.  You will be given one or more of the following:  A medicine to help you relax (sedative).  A medicine to numb the area (local anesthetic).  A medicine to make you fall asleep (general anesthetic).  A medicine that is injected into your spine to numb the area below and slightly above the injection site (spinal anesthetic).  A medicine that is injected into an area of your body to numb everything below the injection site (regional anesthetic).  An incision will be made in your knee.  Damaged cartilage and bone will be removed from your femur, tibia, and patella.  Parts of the prosthesis (liners)will be placed over the areas of bone and cartilage that were removed. A metal liner will be placed over your femur, and plastic liners will be placed over your tibia and the underside of your patella.  One or more small tubes (drains) may be placed near your incision to help drain extra fluid from your surgical site.  Your incision will be closed with stitches (sutures), skin glue, or adhesive strips. Medicine may be applied to your incision.  A bandage (dressing) will be placed over your incision. The procedure may vary among health care providers and hospitals. What happens after the procedure?  Your blood pressure, heart rate, breathing rate, and blood oxygen level will be monitored often until the medicines you were given have worn  off.  You may continue to receive fluids and medicines through an IV tube.  You will have some pain. Pain medicines will be available to help you.  You may have fluid coming from one or more drains in your incision.  You may have to wear compression stockings. These stockings help to prevent blood clots and reduce swelling in your legs.  You will be encouraged to move around as much as possible.  You may be given a continuous passive motion machine to use at home. You will be shown how to use this machine.  Do not drive for 24 hours if you received a sedative. This information is not intended to replace advice given to you by your health care provider. Make sure you discuss any questions you have with your health care provider. Document Released: 01/26/2001 Document Revised: 06/23/2016 Document Reviewed: 09/26/2015 Elsevier Interactive Patient Education  2017 [place].  Total Knee Replacement, Care After These instructions give you information about caring for yourself after your procedure. Your doctor  may also give you more specific instructions. Call your doctor if you have any problems or questions after your procedure. Follow these instructions at home: Medicines   Take over-the-counter and prescription medicines only as told by your doctor.  If you were prescribed an antibiotic medicine, take it as told by your doctor. Do not stop taking the antibiotic even if you start to feel better.  If you were prescribed a blood thinner (anticoagulant), take it as told by your doctor. If you have a splint or brace:   Wear the splint or brace as told by your doctor. Remove it only as told by your doctor.  Loosen the splint or brace if your toes tingle, get numb, or turn cold and blue.  Do not let your splint or brace get wet if it is not waterproof.  Keep the splint or brace clean. Bathing    Do not take baths, swim, or use a hot tub until your doctor says it is okay. Ask your  doctor if you can take showers. You may only be allowed to take sponge baths for bathing.  If you have a splint or brace that is not waterproof, cover it with a watertight covering when you take a bath or a shower.  Keep your bandage (dressing) dry until your doctor says it can be taken off. Incision care and drain care   Check your cut from surgery (incision) and your drain every day for signs of infection. Check for:  More redness, swelling, or pain.  More fluid or blood.  Warmth.  Pus or a bad smell.  Follow instructions from your doctor about how to take care of your cut from surgery. Make sure you:  Wash your hands with soap and water before you change your bandage. If you cannot use soap and water, use hand sanitizer.  Change your bandage as told by your doctor.  Leave stitches (sutures), skin glue, or skin tape (adhesive) strips in place. They may need to stay in place for 2 weeks or longer. If tape strips get loose and curl up, you may trim the loose edges. Do not remove tape strips completely unless your doctor says it is okay.  If you have a drain, follow instructions from your doctor about caring for it. Do not remove the drain tube or any bandages unless your doctor says it is okay. Managing pain, stiffness, and swelling    If directed, put ice on your knee.  Put ice in a plastic bag.  Place a towel between your skin and the bag.  Leave the ice on for 20 minutes, 2-3 times per day.  If directed, apply heat to the affected area as often as told by your doctor. Use the heat source that your doctor recommends, such as a moist heat pack or a heating pad.  Place a towel between your skin and the heat source.  Leave the heat on for 20-30 minutes.  Remove the heat if your skin turns bright red. This is especially important if you are unable to feel pain, heat, or cold. You may have a greater risk of getting burned.  Move your toes often to avoid stiffness and to  lessen swelling.  Raise (elevate) your knee above the level of your heart while you are sitting or lying down.  Wear elastic knee support for as long as told by your doctor. Driving    Do not drive until your doctor says it is okay. Ask your doctor when  it is safe to drive if you have a splint or brace on your knee.  Do not drive or use heavy machinery while taking prescription pain medicine.  Do not drive for 24 hours if you received a sedative. Activity   Do not lift anything that is heavier than 10 lb (4.5 kg) until your doctor says it is okay.  Do not play contact sports until your doctor says it is okay.  Avoid high-impact activities, including running, jumping rope, and jumping jacks.  Avoid sitting for a long time without moving. Get up and move around at least every few hours.  If physical therapy was prescribed, do exercises as told by your doctor.  Return to your normal activities as told by your doctor. Ask your doctor what activities are safe for you. Safety   Do not use your leg to support your body weight until your doctor says that you can. Use crutches or a walker as told by your doctor. General instructions    Do not have any dental work done for at least 3 months after your surgery. When you do have dental work done, tell your dentist about your joint replacement.  Do not use any tobacco products, such as cigarettes, chewing tobacco, or e-cigarettes. If you need help quitting, ask your doctor.  Wear special socks (compression stockings) as told by your doctor.  If you have been sent home with a knee joint motion machine (continuous passive motion machine), use it as told by your doctor.  Drink enough fluid to keep your pee (urine) clear or pale yellow.  If you have been told to lose weight, follow instructions from your doctor about how to do this safely.  Keep all follow-up visits as told by your doctor. This is important. Contact a doctor if:  You  have more redness, swelling, or pain around your cut from surgery or your drain.  You have more fluid or blood coming from your cut from surgery or your drain.  Your cut from surgery or your drain area feels warm to the touch.  You have pus or a bad smell coming from your cut from surgery or your drain.  You have a fever.  Your cut breaks open after your doctor removes your stitches, skin glue, or skin tape strips.  Your new joint feels loose.  You have knee pain that does not go away. Get help right away if:  You have a rash.  You have pain in your calf or thigh.  You have swelling in your calf or thigh.  You have shortness of breath.  You have trouble breathing.  You have chest pain.  Your ability to move your knee is getting worse. This information is not intended to replace advice given to you by your health care provider. Make sure you discuss any questions you have with your health care provider. Document Released: 01/12/2012 Document Revised: 06/23/2016 Document Reviewed: 09/26/2015 Elsevier Interactive Patient Education  2017 [place].  Spinal Anesthesia and Epidural Anesthesia Spinal anesthesia and epidural anesthesia are methods of numbing the body. They are done by injecting numbing medicine (anesthetic) into the back, near the spinal cord. Spinal anesthesia is usually done to numb an area at and below the place where the injection is made. It is often used during surgeries of the pelvis, hips, legs, and lower abdomen. It begins to work almost immediately. Epidural anesthesia may be done to numb an area above or below the area where the injection is  made. It is often used during childbirth and after major abdominal or chest surgery. It begins to work after 10-20 minutes. Tell a health care provider about:  Any allergies you have.  All medicines you are taking, including vitamins, herbs, eye drops, creams, and over-the-counter medicines.  Any problems you or  family members have had with anesthetic medicines.  Any blood disorders you have.  Any surgeries you have had.  Any medical conditions you have.  Whether you are pregnant or may be pregnant.  Any recent alcohol, tobacco, or drug use. What are the risks? Generally, this is a safe procedure. However, problems may occur, including:  Headache.  A drop in blood pressure. In some cases, this can lead to a heart attack or a stroke.  Nerve damage.  Infection.  Allergic reaction to medicines.  Seizures.  Spinal fluid leak.  Bleeding around the injection site.  Inability to breathe. If this happens, a tube may be put into your windpipe (trachea) and a machine may be used to help you breathe until the anesthetic wears off.  Long-lasting numbness, pain, or loss of function of body parts.  Nausea and vomiting.  Dizziness and fainting.  Shivering.  Itching. What happens before the procedure? Staying hydrated  Follow instructions from your health care provider about hydration, which may include:  Up to 2 hours before the procedure - you may continue to drink clear liquids, such as water, clear fruit juice, black coffee, and plain tea. Eating and drinking restrictions  Follow instructions from your health care provider about eating and drinking, which may include:  8 hours before the procedure - stop eating heavy meals or foods such as meat, fried foods, or fatty foods.  6 hours before the procedure - stop eating light meals or foods, such as toast or cereal.  6 hours before the procedure - stop drinking milk or drinks that contain milk.  2 hours before the procedure - stop drinking clear liquids. Medicine  Ask your health care provider about:  Changing or stopping your regular medicines. This is especially important if you are taking diabetes medicines or blood thinners.  Changing or stopping your dietary supplements.  Taking medicines such as aspirin and ibuprofen.  These medicines can thin your blood. Do not take these medicines before your procedure if your health care provider instructs you not to. General instructions    Do not use any tobacco products, such as cigarettes, chewing tobacco, and electronic cigarettes, for as long as possible before your procedure. If you need help quitting, ask your health care provider.  Ask your health care provider if you will have to stay overnight at the hospital or clinic.  If you will not have to stay overnight:  Plan to have someone take you home from the hospital or clinic.  Plan to have someone with you for 24 hours. What happens during the procedure?  A health care provider will put patches on your chest, a cuff around your arm, or a sensor device on your finger. These will be attached to monitors that allow your health care provider to watch your blood pressure, pulse, and oxygen levels to make sure that the anesthetic does not cause any problems.  An IV tube may be inserted into one of your veins. The tube will be used to give you fluids and medicines throughout the procedure as needed.  You may be given a medicine to help you relax (sedative).  You will be asked to sit up  or to lie on your side with your knees and your chin bent toward your chest. These positions open up the space between the bones in your back, making it easier to inject the medicine.  The area of your back where the medicine will be injected will be cleaned.  A medicine called a local anesthetic may be injected to numb the area where the spinal or epidural anesthetic will be injected.  A needle will be inserted between the bones of your back. While this is being done:  Continue to breathe normally.  Stay as still and quiet as you can.  If you feel a tingling shock or pain going down your leg, tell your health care provider but try not to move.  The spinal or epidural anesthetic will be injected.  If you receive an epidural  anesthetic and need more than one dose, a tiny, flexible tube (catheter) will be placed where the anesthetic was injected. Additional doses will be given through the catheter. If you need pain medicine after the procedure, the catheter will be kept in place.  If an epidural catheter has not been placed in your injection site or left there, a small bandage (dressing) will be placed over the injection site. The procedure may vary among health care providers and hospitals. What happens after the procedure?  You will need to stay in bed until your health care provider says it safe for you to walk.  Your blood pressure, heart rate, breathing rate, and blood oxygen level will be monitored until the medicines you were given have worn off.  If you have a catheter, it will be removed when it is no longer needed.  Do not drive for 24 hours if you received a sedative.  It is common to have nausea and itching. There are medicines that can help with these side effects. It is also common to have:  Sleepiness.  Vomiting.  Numbness or tingling in your legs.  Trouble urinating. This information is not intended to replace advice given to you by your health care provider. Make sure you discuss any questions you have with your health care provider. Document Released: 01/10/2004 Document Revised: 04/01/2016 Document Reviewed: 02/11/2016 Elsevier Interactive Patient Education  2017 [place].  Spinal Anesthesia and Epidural Anesthesia, Care After These instructions give you information about caring for yourself after your procedure. Your doctor may also give you more specific instructions. Call your doctor if you have any problems or questions after your procedure. Follow these instructions at home: For at least 24 hours after the procedure:    Do not:  Do activities where you could fall or get hurt (injured).  Drive.  Use heavy machinery.  Drink alcohol.  Take sleeping pills or medicines  that make you sleepy (drowsy).  Make important decisions.  Sign legal documents.  Take care of children on your own.  Rest. Eating and drinking   If you throw up (vomit), drink water, juice, or soup when you can drink without throwing up.  Make sure you do not feel like throwing up (are not nauseous) before you eat.  Follow the diet that is recommended by your doctor. General instructions   Have a responsible adult stay with you until you are awake and alert.  Take over-the-counter and prescription medicines only as told by your doctor.  If you smoke, do not smoke unless an adult is watching.  Keep all follow-up visits as told by your doctor. This is important. Contact a doctor  if:  It has been more than one day since your procedure and you feel like throwing up.  It has been more than one day since your procedure and you throw up.  You have a rash. Get help right away if:  You have a fever.  You have a headache that lasts a long time.  You have a very bad headache.  Your vision is blurry.  You see two of a single object (double vision).  You are dizzy or light-headed.  You faint.  Your arms or legs tingle, feel weak, or get numb.  You have trouble breathing.  You cannot pee (urinate). This information is not intended to replace advice given to you by your health care provider. Make sure you discuss any questions you have with your health care provider. Document Released: 02/11/2016 Document Revised: 06/12/2016 Document Reviewed: 02/11/2016 Elsevier Interactive Patient Education  2017 [place] Anesthesia, Adult General anesthesia is the use of medicines to make a person "go to sleep" (be unconscious) for a medical procedure. General anesthesia is often recommended when a procedure:  Is long.  Requires you to be still or in an unusual position.  Is major and can cause you to lose blood.  Is impossible to do without general  anesthesia. The medicines used for general anesthesia are called general anesthetics. In addition to making you sleep, the medicines:  Prevent pain.  Control your blood pressure.  Relax your muscles. Tell a health care provider about:  Any allergies you have.  All medicines you are taking, including vitamins, herbs, eye drops, creams, and over-the-counter medicines.  Any problems you or family members have had with anesthetic medicines.  Types of anesthetics you have had in the past.  Any bleeding disorders you have.  Any surgeries you have had.  Any medical conditions you have.  Any history of heart or lung conditions, such as heart failure, sleep apnea, or chronic obstructive pulmonary disease (COPD).  Whether you are pregnant or may be pregnant.  Whether you use tobacco, alcohol, marijuana, or street drugs.  Any history of Armed forces logistics/support/administrative officer.  Any history of depression or anxiety. What are the risks? Generally, this is a safe procedure. However, problems may occur, including:  Allergic reaction to anesthetics.  Lung and heart problems.  Inhaling food or liquids from your stomach into your lungs (aspiration).  Injury to nerves.  Waking up during your procedure and being unable to move (rare).  Extreme agitation or a state of mental confusion (delirium) when you wake up from the anesthetic.  Air in the bloodstream, which can lead to stroke. These problems are more likely to develop if you are having a major surgery or if you have an advanced medical condition. You can prevent some of these complications by answering all of your health care provider's questions thoroughly and by following all pre-procedure instructions. General anesthesia can cause side effects, including:  Nausea or vomiting  A sore throat from the breathing tube.  Feeling cold or shivery.  Feeling tired, washed out, or achy.  Sleepiness or drowsiness.  Confusion or agitation. What  happens before the procedure? Staying hydrated  Follow instructions from your health care provider about hydration, which may include:  Up to 2 hours before the procedure - you may continue to drink clear liquids, such as water, clear fruit juice, black coffee, and plain tea. Eating and drinking restrictions  Follow instructions from your health care provider about eating and drinking, which may include:  8 hours before the procedure - stop eating heavy meals or foods such as meat, fried foods, or fatty foods.  6 hours before the procedure - stop eating light meals or foods, such as toast or cereal.  6 hours before the procedure - stop drinking milk or drinks that contain milk.  2 hours before the procedure - stop drinking clear liquids. Medicines   Ask your health care provider about:  Changing or stopping your regular medicines. This is especially important if you are taking diabetes medicines or blood thinners.  Taking medicines such as aspirin and ibuprofen. These medicines can thin your blood. Do not take these medicines before your procedure if your health care provider instructs you not to.  Taking new dietary supplements or medicines. Do not take these during the week before your procedure unless your health care provider approves them.  If you are told to take a medicine or to continue taking a medicine on the day of the procedure, take the medicine with sips of water. General instructions    Ask if you will be going home the same day, the following day, or after a longer hospital stay.  Plan to have someone take you home.  Plan to have someone stay with you for the first 24 hours after you leave the hospital or clinic.  For 3-6 weeks before the procedure, try not to use any tobacco products, such as cigarettes, chewing tobacco, and e-cigarettes.  You may brush your teeth on the morning of the procedure, but make sure to spit out the toothpaste. What happens during the  procedure?  You will be given anesthetics through a mask and through an IV tube in one of your veins.  You may receive medicine to help you relax (sedative).  As soon as you are asleep, a breathing tube may be used to help you breathe.  An anesthesia specialist will stay with you throughout the procedure. He or she will help keep you comfortable and safe by continuing to give you medicines and adjusting the amount of medicine that you get. He or she will also watch your blood pressure, pulse, and oxygen levels to make sure that the anesthetics do not cause any problems.  If a breathing tube was used to help you breathe, it will be removed before you wake up. The procedure may vary among health care providers and hospitals. What happens after the procedure?  You will wake up, often slowly, after the procedure is complete, usually in a recovery area.  Your blood pressure, heart rate, breathing rate, and blood oxygen level will be monitored until the medicines you were given have worn off.  You may be given medicine to help you calm down if you feel anxious or agitated.  If you will be going home the same day, your health care provider may check to make sure you can stand, drink, and urinate.  Your health care providers will treat your pain and side effects before you go home.  Do not drive for 24 hours if you received a sedative.  You may:  Feel nauseous and vomit.  Have a sore throat.  Have mental slowness.  Feel cold or shivery.  Feel sleepy.  Feel tired.  Feel sore or achy, even in parts of your body where you did not have surgery. This information is not intended to replace advice given to you by your health care provider. Make sure you discuss any questions you have with your health care provider.  Document Released: 01/27/2008 Document Revised: 04/01/2016 Document Reviewed: 10/04/2015 Elsevier Interactive Patient Education  2017 [place] [facility], Adult,  Care After These instructions provide you with information about caring for yourself after your procedure. Your health care provider may also give you more specific instructions. Your treatment has been planned according to current medical practices, but problems sometimes occur. Call your health care provider if you have any problems or questions after your procedure. What can I expect after the procedure? After the procedure, it is common to have:  Vomiting.  A sore throat.  Mental slowness. It is common to feel:  Nauseous.  Cold or shivery.  Sleepy.  Tired.  Sore or achy, even in parts of your body where you did not have surgery. Follow these instructions at home: For at least 24 hours after the procedure:   Do not:  Participate in activities where you could fall or become injured.  Drive.  Use heavy machinery.  Drink alcohol.  Take sleeping pills or medicines that cause drowsiness.  Make important decisions or sign legal documents.  Take care of children on your own.  Rest. Eating and drinking   If you vomit, drink water, juice, or soup when you can drink without vomiting.  Drink enough fluid to keep your urine clear or pale yellow.  Make sure you have little or no nausea before eating solid foods.  Follow the diet recommended by your health care provider. General instructions   Have a responsible adult stay with you until you are awake and alert.  Return to your normal activities as told by your health care provider. Ask your health care provider what activities are safe for you.  Take over-the-counter and prescription medicines only as told by your health care provider.  If you smoke, do not smoke without supervision.  Keep all follow-up visits as told by your health care provider. This is important. Contact a health care provider if:  You continue to have nausea or vomiting at home, and medicines are not helpful.  You cannot drink fluids or  start eating again.  You cannot urinate after 8-12 hours.  You develop a skin rash.  You have fever.  You have increasing redness at the site of your procedure. Get help right away if:  You have difficulty breathing.  You have chest pain.  You have unexpected bleeding.  You feel that you are having a life-threatening or urgent problem. This information is not intended to replace advice given to you by your health care provider. Make sure you discuss any questions you have with your health care provider. Document Released: 01/26/2001 Document Revised: 03/24/2016 Document Reviewed: 10/04/2015 Elsevier Interactive Patient Education  2017 [employer].

## 2017-03-13 ENCOUNTER — Encounter (HOSPITAL_COMMUNITY): Payer: Self-pay

## 2017-03-13 ENCOUNTER — Encounter (HOSPITAL_COMMUNITY)
Admission: RE | Admit: 2017-03-13 | Discharge: 2017-03-13 | Disposition: A | Discharge status: Home / Self Care | Payer: Medicare Other | Source: Ambulatory Visit | Attending: Orthopedic Surgery | Admitting: Orthopedic Surgery

## 2017-03-13 DIAGNOSIS — Z01812 Encounter for preprocedural laboratory examination: Secondary | ICD-10-CM | POA: Insufficient documentation

## 2017-03-13 DIAGNOSIS — M25561 Pain in right knee: Secondary | ICD-10-CM | POA: Diagnosis not present

## 2017-03-13 DIAGNOSIS — Z01810 Encounter for preprocedural cardiovascular examination: Secondary | ICD-10-CM | POA: Diagnosis not present

## 2017-03-13 HISTORY — DX: Gastro-esophageal reflux disease without esophagitis: K21.9

## 2017-03-13 HISTORY — DX: Unspecified convulsions: R56.9

## 2017-03-13 LAB — CBC WITH DIFFERENTIAL/PLATELET
BASOS ABS: 0 10*3/uL (ref 0.0–0.1)
Basophils Relative: 0 %
EOS PCT: 2 %
Eosinophils Absolute: 0.2 10*3/uL (ref 0.0–0.7)
HCT: 37.9 % (ref 36.0–46.0)
Hemoglobin: 12.5 g/dL (ref 12.0–15.0)
Lymphocytes Relative: 32 %
Lymphs Abs: 2.6 10*3/uL (ref 0.7–4.0)
MCH: 29.2 pg (ref 26.0–34.0)
MCHC: 33 g/dL (ref 30.0–36.0)
MCV: 88.6 fL (ref 78.0–100.0)
MONO ABS: 0.5 10*3/uL (ref 0.1–1.0)
MONOS PCT: 6 %
Neutro Abs: 4.9 10*3/uL (ref 1.7–7.7)
Neutrophils Relative %: 60 %
PLATELETS: 369 10*3/uL (ref 150–400)
RBC: 4.28 MIL/uL (ref 3.87–5.11)
RDW: 13.1 % (ref 11.5–15.5)
WBC: 8.2 10*3/uL (ref 4.0–10.5)

## 2017-03-13 LAB — BASIC METABOLIC PANEL
Anion gap: 10 (ref 5–15)
BUN: 22 mg/dL — AB (ref 6–20)
CO2: 26 mmol/L (ref 22–32)
Calcium: 9.8 mg/dL (ref 8.9–10.3)
Chloride: 105 mmol/L (ref 101–111)
Creatinine, Ser: 1.33 mg/dL — ABNORMAL HIGH (ref 0.44–1.00)
GFR calc Af Amer: 52 mL/min — ABNORMAL LOW (ref >60)
GFR, EST NON AFRICAN AMERICAN: 45 mL/min — AB (ref >60)
GLUCOSE: 125 mg/dL — AB (ref 65–99)
POTASSIUM: 4 mmol/L (ref 3.5–5.1)
Sodium: 141 mmol/L (ref 135–145)

## 2017-03-13 LAB — SURGICAL PCR SCREEN
MRSA, PCR: NEGATIVE
STAPHYLOCOCCUS AUREUS: NEGATIVE

## 2017-03-13 LAB — PREPARE RBC (CROSSMATCH)

## 2017-03-13 LAB — APTT: APTT: 42 s — AB (ref 24–36)

## 2017-03-13 LAB — PROTIME-INR
INR: 1.32
Prothrombin Time: 16.5 seconds — ABNORMAL HIGH (ref 11.4–15.2)

## 2017-03-13 LAB — ABO/RH: ABO/RH(D): B POS

## 2017-03-16 NOTE — Pre-Procedure Instructions (Signed)
PT/INR and PTT reported to Dr Patsey Berthold. No orders given.

## 2017-03-17 ENCOUNTER — Encounter: Payer: Self-pay | Admitting: *Deleted

## 2017-03-17 MED ORDER — TRANEXAMIC ACID 1000 MG/10ML IV SOLN
1000.0000 mg | INTRAVENOUS | Status: AC
  Administered 2017-03-18: 1000 mg via INTRAVENOUS
  Filled 2017-03-17: qty 10

## 2017-03-17 NOTE — Progress Notes (Signed)
Per patient plan surgical pre authorization due at time of admission  REFERENCE [id number]

## 2017-03-17 NOTE — H&P (Signed)
TOTAL KNEE ADMISSION H&P  Patient is being admitted for right total knee arthroplasty.  Subjective:  Chief Complaint:right knee pain.  HPI: Kelli Spencer, 51 y.o. female, has a history of pain and functional disability in the right knee due to arthritis and has failed non-surgical conservative treatments for greater than 12 weeks to includeNSAID's and/or analgesics, corticosteriod injections, use of assistive devices, weight reduction as appropriate and activity modification.  Onset of symptoms was gradual, starting 5 years ago with gradually worsening course since that time. The patient noted no past surgery on the right knee(s).  Patient currently rates pain in the right knee(s) at 7 out of 10 with activity. Patient has night pain, worsening of pain with activity and weight bearing, pain that interferes with activities of daily living, pain with passive range of motion, crepitus and joint swelling.   There is no active infection.  Patient Active Problem List   Diagnosis Date Noted  . Dyslipidemia, goal LDL below 100 05/05/2016  . CKD (chronic kidney disease) 02/01/2016  . Vitamin D deficiency 01/16/2016  . GERD (gastroesophageal reflux disease) 08/19/2015  . Gout 08/19/2015  . Immunization refused 10/16/2014  . Medial meniscus, posterior horn derangement 02/02/2014  . Diabetes mellitus type 2 in obese ([place]) 06/25/2013  . Chronic pain of both knees 03/23/2013  . Patellar tendonitis 03/22/2013  . Patellar tendinitis of right knee 06/16/2012  . Essential hypertension 04/30/2011  . Obesity 02/10/2008   Past Medical History:  Diagnosis Date  . Arthritis   . Asthma   . Diabetes mellitus without complication ([place])   . Diastolic hypertension   . GERD (gastroesophageal reflux disease)   . Hyperlipidemia   . Hypertension   . Migraines   . Obesity   . Seizures ([place])    pt states she has seizures only when she gets upset. pt is taking no seizure meds    Past Surgical History:   Procedure Laterality Date  . ABDOMINAL HYSTERECTOMY  1997   fibroids  . COLONOSCOPY N/A 06/23/2016   Procedure: COLONOSCOPY;  Surgeon: Danie Binder, MD;  Location: [facility];  Service: Endoscopy;  Laterality: N/A;  11:30 AM  . KNEE ARTHROSCOPY WITH MEDIAL MENISECTOMY Left 02/09/2014   Procedure: KNEE ARTHROSCOPY WITH MEDIAL MENISECTOMY;  Surgeon: Carole Civil, MD;  Location: [facility] OR[facility];  Service: Orthopedics;  Laterality: Left;  . PARTIAL HYSTERECTOMY      No prescriptions prior to admission.   Allergies  Allergen Reactions  . Prednisone Swelling and Other (See Comments)    Made mouth and throat feel numb  . Ace Inhibitors Cough  . Metformin And Related Diarrhea    Social History  Substance Use Topics  . Smoking status: Never Smoker  . Smokeless tobacco: Never Used  . Alcohol use No    Family History  Problem Relation Age of Onset  . Kidney failure Mother   . Diabetes Mother   . Hypertension Mother   . Stroke Mother   . Diabetes Father   . Heart disease Father   . Hypertension Sister      Review of Systems  Constitutional: Negative for chills and fever.  Respiratory: Negative for shortness of breath.   Cardiovascular: Negative for chest pain.  Musculoskeletal: Positive for back pain.  All other systems reviewed and are negative.   Objective:  Physical Exam  Constitutional: She is oriented to person, place, and time. She appears well-nourished.  Eyes: Right eye exhibits no discharge. Left eye exhibits no discharge. No  scleral icterus.  Neck: Neck supple. No JVD present. No tracheal deviation present.  Cardiovascular: Intact distal pulses.   Respiratory: Effort normal. No stridor.  GI: Soft. She exhibits no distension.  Neurological: She is alert and oriented to person, place, and time. She has normal reflexes. She exhibits normal muscle tone. Coordination normal.  Skin: Skin is warm and dry. No rash noted. No erythema. No pallor.  Psychiatric: She has a  normal mood and affect. Her behavior is normal. Thought content normal.   Left and right upper extremity are well aligned without subluxation contracture atrophy tremor. Neurovascular exam is intact skin is normal  Right knee tenderness in the medial compartment no significant effusion. Range of motion is limited at 120 there is a small flexion contracture the collateral ligaments and cruciate ligaments are stable to motor exam is normal the extensor mechanism is intact the neurovascular exam is normal and skin is normal as well  Left knee no significant abnormalities on range of motion stability and strength testing neurovascular exam intact  Vital signs in last 24 hours:    Labs:   Estimated body mass index is 38.67 kg/m as calculated from the following:   Height as of 03/13/17: 5' (1.524 m).   Weight as of 03/13/17: 198 lb (89.8 kg).   Imaging Review Plain radiographs demonstrate moderate degenerative joint disease of the right knee(s). The overall alignment ismild varus. The bone quality appears to be good for age and reported activity level.  Assessment/Plan:  End stage arthritis, right knee   The patient history, physical examination, clinical judgment of the provider and imaging studies are consistent with end stage degenerative joint disease of the right knee(s) and total knee arthroplasty is deemed medically necessary. The treatment options including medical management, injection therapy arthroscopy and arthroplasty were discussed at length. The risks and benefits of total knee arthroplasty were presented and reviewed. The risks due to aseptic loosening, infection, stiffness, patella tracking problems, thromboembolic complications and other imponderables were discussed. The patient acknowledged the explanation, agreed to proceed with the plan and consent was signed. Patient is being admitted for inpatient treatment for surgery, pain control, PT, OT, prophylactic antibiotics, VTE  prophylaxis, progressive ambulation and ADL's and discharge planning. The patient is planning to be discharged home with home health services

## 2017-03-18 ENCOUNTER — Inpatient Hospital Stay (HOSPITAL_COMMUNITY)
Admission: RE | Admit: 2017-03-18 | Discharge: 2017-03-20 | DRG: 470 | Disposition: A | Discharge status: Home / Self Care | Payer: Medicare Other | Source: Ambulatory Visit | Attending: Orthopedic Surgery | Admitting: Orthopedic Surgery

## 2017-03-18 ENCOUNTER — Inpatient Hospital Stay (HOSPITAL_COMMUNITY): Payer: Medicare Other | Admitting: Anesthesiology

## 2017-03-18 ENCOUNTER — Encounter (HOSPITAL_COMMUNITY)
Admission: RE | Disposition: A | Discharge status: Home / Self Care | Payer: Self-pay | Source: Ambulatory Visit | Attending: Orthopedic Surgery

## 2017-03-18 ENCOUNTER — Other Ambulatory Visit: Payer: Self-pay | Admitting: *Deleted

## 2017-03-18 ENCOUNTER — Inpatient Hospital Stay (HOSPITAL_COMMUNITY): Payer: Medicare Other

## 2017-03-18 ENCOUNTER — Encounter (HOSPITAL_COMMUNITY): Payer: Self-pay | Admitting: Anesthesiology

## 2017-03-18 DIAGNOSIS — N189 Chronic kidney disease, unspecified: Secondary | ICD-10-CM | POA: Diagnosis present

## 2017-03-18 DIAGNOSIS — Z79899 Other long term (current) drug therapy: Secondary | ICD-10-CM

## 2017-03-18 DIAGNOSIS — Z96651 Presence of right artificial knee joint: Secondary | ICD-10-CM | POA: Diagnosis not present

## 2017-03-18 DIAGNOSIS — I129 Hypertensive chronic kidney disease with stage 1 through stage 4 chronic kidney disease, or unspecified chronic kidney disease: Secondary | ICD-10-CM | POA: Diagnosis present

## 2017-03-18 DIAGNOSIS — R569 Unspecified convulsions: Secondary | ICD-10-CM | POA: Diagnosis present

## 2017-03-18 DIAGNOSIS — E785 Hyperlipidemia, unspecified: Secondary | ICD-10-CM | POA: Diagnosis present

## 2017-03-18 DIAGNOSIS — E1122 Type 2 diabetes mellitus with diabetic chronic kidney disease: Secondary | ICD-10-CM | POA: Diagnosis not present

## 2017-03-18 DIAGNOSIS — M109 Gout, unspecified: Secondary | ICD-10-CM | POA: Diagnosis present

## 2017-03-18 DIAGNOSIS — F329 Major depressive disorder, single episode, unspecified: Secondary | ICD-10-CM | POA: Diagnosis not present

## 2017-03-18 DIAGNOSIS — Z471 Aftercare following joint replacement surgery: Secondary | ICD-10-CM | POA: Diagnosis not present

## 2017-03-18 DIAGNOSIS — Z888 Allergy status to other drugs, medicaments and biological substances status: Secondary | ICD-10-CM

## 2017-03-18 DIAGNOSIS — M1711 Unilateral primary osteoarthritis, right knee: Secondary | ICD-10-CM | POA: Diagnosis not present

## 2017-03-18 DIAGNOSIS — J45909 Unspecified asthma, uncomplicated: Secondary | ICD-10-CM | POA: Diagnosis not present

## 2017-03-18 DIAGNOSIS — Z96659 Presence of unspecified artificial knee joint: Secondary | ICD-10-CM

## 2017-03-18 DIAGNOSIS — K219 Gastro-esophageal reflux disease without esophagitis: Secondary | ICD-10-CM | POA: Diagnosis not present

## 2017-03-18 DIAGNOSIS — Z90710 Acquired absence of both cervix and uterus: Secondary | ICD-10-CM

## 2017-03-18 HISTORY — PX: TOTAL KNEE ARTHROPLASTY: SHX125

## 2017-03-18 LAB — GLUCOSE, CAPILLARY
GLUCOSE-CAPILLARY: 165 mg/dL — AB (ref 65–99)
GLUCOSE-CAPILLARY: 190 mg/dL — AB (ref 65–99)
Glucose-Capillary: 137 mg/dL — ABNORMAL HIGH (ref 65–99)

## 2017-03-18 SURGERY — ARTHROPLASTY, KNEE, TOTAL
Anesthesia: Spinal | Site: Knee | Laterality: Right

## 2017-03-18 MED ORDER — BUPIVACAINE LIPOSOME 1.3 % IJ SUSP
INTRAMUSCULAR | Status: AC
  Filled 2017-03-18: qty 20

## 2017-03-18 MED ORDER — BUPIVACAINE-EPINEPHRINE (PF) 0.5% -1:200000 IJ SOLN
INTRAMUSCULAR | Status: AC
  Filled 2017-03-18: qty 30

## 2017-03-18 MED ORDER — EPHEDRINE SULFATE 50 MG/ML IJ SOLN
INTRAMUSCULAR | Status: AC
  Filled 2017-03-18: qty 1

## 2017-03-18 MED ORDER — LINAGLIPTIN 5 MG PO TABS
5.0000 mg | ORAL_TABLET | Freq: Every day | ORAL | Status: DC
  Administered 2017-03-19 – 2017-03-20 (×2): 5 mg via ORAL
  Filled 2017-03-18 (×2): qty 1

## 2017-03-18 MED ORDER — CELECOXIB 100 MG PO CAPS
200.0000 mg | ORAL_CAPSULE | Freq: Two times a day (BID) | ORAL | Status: DC
  Administered 2017-03-18 – 2017-03-20 (×4): 200 mg via ORAL
  Filled 2017-03-18 (×4): qty 2

## 2017-03-18 MED ORDER — ALUM & MAG HYDROXIDE-SIMETH 200-200-20 MG/5ML PO SUSP: 30.0000 mL | ORAL | Status: DC | PRN

## 2017-03-18 MED ORDER — ALBUTEROL SULFATE (2.5 MG/3ML) 0.083% IN NEBU
2.5000 mg | INHALATION_SOLUTION | Freq: Four times a day (QID) | RESPIRATORY_TRACT | Status: DC | PRN
Start: 2017-03-18 — End: 2017-03-20

## 2017-03-18 MED ORDER — MENTHOL 3 MG MT LOZG: 1.0000 | LOZENGE | OROMUCOSAL | Status: DC | PRN

## 2017-03-18 MED ORDER — HYDROCODONE-ACETAMINOPHEN 10-325 MG PO TABS
1.0000 | ORAL_TABLET | ORAL | Status: DC
  Administered 2017-03-18 – 2017-03-20 (×12): 1 via ORAL
  Filled 2017-03-18 (×12): qty 1

## 2017-03-18 MED ORDER — FENTANYL CITRATE (PF) 100 MCG/2ML IJ SOLN
INTRAMUSCULAR | Status: AC
  Filled 2017-03-18: qty 2

## 2017-03-18 MED ORDER — PRAVASTATIN SODIUM 10 MG PO TABS
10.0000 mg | ORAL_TABLET | Freq: Every day | ORAL | Status: DC
  Administered 2017-03-18 – 2017-03-19 (×2): 10 mg via ORAL
  Filled 2017-03-18 (×2): qty 1

## 2017-03-18 MED ORDER — ACETAMINOPHEN 650 MG RE SUPP: 650.0000 mg | Freq: Four times a day (QID) | RECTAL | Status: DC | PRN

## 2017-03-18 MED ORDER — KETOROLAC TROMETHAMINE 15 MG/ML IJ SOLN
7.5000 mg | Freq: Four times a day (QID) | INTRAMUSCULAR | Status: AC
  Administered 2017-03-19 (×4): 7.5 mg via INTRAVENOUS
  Filled 2017-03-18 (×4): qty 1

## 2017-03-18 MED ORDER — ONDANSETRON HCL 4 MG/2ML IJ SOLN
4.0000 mg | Freq: Once | INTRAMUSCULAR | Status: AC
  Administered 2017-03-18: 4 mg via INTRAVENOUS

## 2017-03-18 MED ORDER — PROPOFOL 10 MG/ML IV BOLUS
INTRAVENOUS | Status: DC | PRN
  Administered 2017-03-18 (×4): 10 mg via INTRAVENOUS

## 2017-03-18 MED ORDER — SODIUM CHLORIDE 0.9 % IV SOLN
INTRAVENOUS | Status: DC
Start: 2017-03-18 — End: 2017-03-19
  Administered 2017-03-18 (×2): via INTRAVENOUS

## 2017-03-18 MED ORDER — ONDANSETRON HCL 4 MG/2ML IJ SOLN
INTRAMUSCULAR | Status: AC
  Filled 2017-03-18: qty 2

## 2017-03-18 MED ORDER — 0.9 % SODIUM CHLORIDE (POUR BTL) OPTIME
TOPICAL | Status: DC | PRN
  Administered 2017-03-18: 1000 mL

## 2017-03-18 MED ORDER — LIDOCAINE HCL 1 % IJ SOLN
INTRAMUSCULAR | Status: DC | PRN
  Administered 2017-03-18: 10 mg via INTRADERMAL

## 2017-03-18 MED ORDER — HYDROMORPHONE HCL 1 MG/ML IJ SOLN
0.5000 mg | INTRAMUSCULAR | Status: DC | PRN
  Administered 2017-03-18 – 2017-03-19 (×8): 0.5 mg via INTRAVENOUS
  Filled 2017-03-18 (×9): qty 0.5

## 2017-03-18 MED ORDER — METHOCARBAMOL 1000 MG/10ML IJ SOLN
500.0000 mg | Freq: Once | INTRAVENOUS | Status: AC
  Administered 2017-03-18: 500 mg via INTRAVENOUS
  Filled 2017-03-18: qty 550

## 2017-03-18 MED ORDER — LACTATED RINGERS IV SOLN
INTRAVENOUS | Status: DC
  Administered 2017-03-18 (×2): via INTRAVENOUS

## 2017-03-18 MED ORDER — HYDROMORPHONE HCL 1 MG/ML IJ SOLN: 0.2500 mg | INTRAMUSCULAR | Status: DC | PRN

## 2017-03-18 MED ORDER — METHOCARBAMOL 500 MG PO TABS
500.0000 mg | ORAL_TABLET | Freq: Four times a day (QID) | ORAL | Status: DC | PRN
  Administered 2017-03-19: 500 mg via ORAL
  Filled 2017-03-18: qty 1

## 2017-03-18 MED ORDER — SODIUM CHLORIDE 0.9 % IJ SOLN
INTRAMUSCULAR | Status: AC
  Filled 2017-03-18: qty 40

## 2017-03-18 MED ORDER — GABAPENTIN 100 MG PO CAPS
100.0000 mg | ORAL_CAPSULE | Freq: Three times a day (TID) | ORAL | Status: DC
  Administered 2017-03-18 – 2017-03-20 (×6): 100 mg via ORAL
  Filled 2017-03-18 (×6): qty 1

## 2017-03-18 MED ORDER — PHENYLEPHRINE HCL 10 MG/ML IJ SOLN
INTRAMUSCULAR | Status: DC | PRN
  Administered 2017-03-18 (×2): 80 ug via INTRAVENOUS
  Administered 2017-03-18: 40 ug via INTRAVENOUS
  Administered 2017-03-18: 80 ug via INTRAVENOUS

## 2017-03-18 MED ORDER — CHOLECALCIFEROL 10 MCG (400 UNIT) PO TABS
800.0000 [IU] | ORAL_TABLET | Freq: Every day | ORAL | Status: DC
  Administered 2017-03-19 – 2017-03-20 (×2): 800 [IU] via ORAL
  Filled 2017-03-18 (×2): qty 2

## 2017-03-18 MED ORDER — LINAGLIPTIN 5 MG PO TABS: 5.0000 mg | ORAL_TABLET | Freq: Every day | ORAL | Status: DC

## 2017-03-18 MED ORDER — BUPIVACAINE LIPOSOME 1.3 % IJ SUSP
20.0000 mL | Freq: Once | INTRAMUSCULAR | Status: DC
  Filled 2017-03-18: qty 20

## 2017-03-18 MED ORDER — MIDAZOLAM HCL 2 MG/2ML IJ SOLN
INTRAMUSCULAR | Status: AC
  Filled 2017-03-18: qty 2

## 2017-03-18 MED ORDER — MIDAZOLAM HCL 5 MG/5ML IJ SOLN
INTRAMUSCULAR | Status: DC | PRN
  Administered 2017-03-18: 0.5 mg via INTRAVENOUS
  Administered 2017-03-18 (×2): .5 mg via INTRAVENOUS
  Administered 2017-03-18: 1 mg via INTRAVENOUS
  Administered 2017-03-18 (×3): 0.5 mg via INTRAVENOUS

## 2017-03-18 MED ORDER — ACETAMINOPHEN 325 MG PO TABS: 650.0000 mg | ORAL_TABLET | Freq: Four times a day (QID) | ORAL | Status: DC | PRN

## 2017-03-18 MED ORDER — ONDANSETRON HCL 4 MG PO TABS: 4.0000 mg | ORAL_TABLET | Freq: Four times a day (QID) | ORAL | Status: DC | PRN

## 2017-03-18 MED ORDER — BISACODYL 10 MG RE SUPP: 10.0000 mg | Freq: Every day | RECTAL | Status: DC | PRN

## 2017-03-18 MED ORDER — SENNA 8.6 MG PO TABS
1.0000 | ORAL_TABLET | Freq: Two times a day (BID) | ORAL | Status: DC
  Administered 2017-03-19 – 2017-03-20 (×3): 8.6 mg via ORAL
  Filled 2017-03-18 (×3): qty 1

## 2017-03-18 MED ORDER — MIDAZOLAM HCL 2 MG/2ML IJ SOLN
1.0000 mg | INTRAMUSCULAR | Status: AC | PRN
  Administered 2017-03-18 (×2): 1 mg via INTRAVENOUS

## 2017-03-18 MED ORDER — METHOCARBAMOL 1000 MG/10ML IJ SOLN
500.0000 mg | Freq: Four times a day (QID) | INTRAVENOUS | Status: DC | PRN
  Filled 2017-03-18: qty 5

## 2017-03-18 MED ORDER — HYDROXYZINE HCL 25 MG PO TABS: 50.0000 mg | ORAL_TABLET | Freq: Every evening | ORAL | Status: DC | PRN

## 2017-03-18 MED ORDER — CEFAZOLIN SODIUM-DEXTROSE 2-4 GM/100ML-% IV SOLN
2.0000 g | INTRAVENOUS | Status: AC
  Administered 2017-03-18: 2 g via INTRAVENOUS

## 2017-03-18 MED ORDER — SODIUM CHLORIDE 0.9 % IJ SOLN
INTRAMUSCULAR | Status: AC
  Filled 2017-03-18: qty 10

## 2017-03-18 MED ORDER — DIPHENHYDRAMINE HCL 12.5 MG/5ML PO ELIX
12.5000 mg | ORAL_SOLUTION | ORAL | Status: DC | PRN
Start: 2017-03-18 — End: 2017-03-20

## 2017-03-18 MED ORDER — PROPOFOL 500 MG/50ML IV EMUL
INTRAVENOUS | Status: DC | PRN
  Administered 2017-03-18: 08:00:00 via INTRAVENOUS
  Administered 2017-03-18: 50 ug/kg/min via INTRAVENOUS

## 2017-03-18 MED ORDER — ONDANSETRON HCL 4 MG/2ML IJ SOLN
4.0000 mg | Freq: Once | INTRAMUSCULAR | Status: AC
  Administered 2017-03-18: 4 mg via INTRAVENOUS
  Filled 2017-03-18: qty 2

## 2017-03-18 MED ORDER — METOCLOPRAMIDE HCL 10 MG PO TABS: 5.0000 mg | ORAL_TABLET | Freq: Three times a day (TID) | ORAL | Status: DC | PRN

## 2017-03-18 MED ORDER — PROPOFOL 10 MG/ML IV BOLUS
INTRAVENOUS | Status: AC
  Filled 2017-03-18: qty 20

## 2017-03-18 MED ORDER — ALBUTEROL SULFATE HFA 108 (90 BASE) MCG/ACT IN AERS: 2.0000 | INHALATION_SPRAY | Freq: Four times a day (QID) | RESPIRATORY_TRACT | Status: DC | PRN

## 2017-03-18 MED ORDER — EPHEDRINE SULFATE 50 MG/ML IJ SOLN
INTRAMUSCULAR | Status: DC | PRN
  Administered 2017-03-18: 5 mg via INTRAVENOUS
  Administered 2017-03-18: 10 mg via INTRAVENOUS

## 2017-03-18 MED ORDER — PHENOL 1.4 % MT LIQD: 1.0000 | OROMUCOSAL | Status: DC | PRN

## 2017-03-18 MED ORDER — PANTOPRAZOLE SODIUM 40 MG PO TBEC
40.0000 mg | DELAYED_RELEASE_TABLET | Freq: Every day | ORAL | Status: DC
  Administered 2017-03-19 – 2017-03-20 (×2): 40 mg via ORAL
  Filled 2017-03-18 (×2): qty 1

## 2017-03-18 MED ORDER — OXYCODONE HCL 5 MG PO TABS
5.0000 mg | ORAL_TABLET | Freq: Once | ORAL | Status: AC
  Administered 2017-03-18: 5 mg via ORAL
  Filled 2017-03-18: qty 1

## 2017-03-18 MED ORDER — ONDANSETRON HCL 4 MG/2ML IJ SOLN: 4.0000 mg | Freq: Four times a day (QID) | INTRAMUSCULAR | Status: DC | PRN

## 2017-03-18 MED ORDER — CELECOXIB 400 MG PO CAPS
400.0000 mg | ORAL_CAPSULE | Freq: Once | ORAL | Status: AC
  Administered 2017-03-18: 400 mg via ORAL
  Filled 2017-03-18: qty 1

## 2017-03-18 MED ORDER — FENTANYL CITRATE (PF) 100 MCG/2ML IJ SOLN
INTRAMUSCULAR | Status: DC | PRN
  Administered 2017-03-18 (×3): 25 ug via INTRAVENOUS
  Administered 2017-03-18: 25 ug via INTRATHECAL

## 2017-03-18 MED ORDER — SODIUM CHLORIDE 0.9 % IV SOLN
INTRAVENOUS | Status: DC | PRN
  Administered 2017-03-18: 60 mL

## 2017-03-18 MED ORDER — ASPIRIN EC 325 MG PO TBEC
325.0000 mg | DELAYED_RELEASE_TABLET | Freq: Every day | ORAL | Status: DC
  Administered 2017-03-19 – 2017-03-20 (×2): 325 mg via ORAL
  Filled 2017-03-18 (×2): qty 1

## 2017-03-18 MED ORDER — CHLORHEXIDINE GLUCONATE 4 % EX LIQD: 60.0000 mL | Freq: Once | CUTANEOUS | Status: DC

## 2017-03-18 MED ORDER — BUPIVACAINE IN DEXTROSE 0.75-8.25 % IT SOLN
INTRATHECAL | Status: DC | PRN
  Administered 2017-03-18: 1.75 mL via INTRATHECAL

## 2017-03-18 MED ORDER — POLYETHYLENE GLYCOL 3350 17 G PO PACK: 17.0000 g | PACK | Freq: Every day | ORAL | Status: DC | PRN

## 2017-03-18 MED ORDER — FENTANYL CITRATE (PF) 100 MCG/2ML IJ SOLN
25.0000 ug | INTRAMUSCULAR | Status: DC | PRN
  Administered 2017-03-18: 25 ug via INTRAVENOUS

## 2017-03-18 MED ORDER — PHENYLEPHRINE 40 MCG/ML (10ML) SYRINGE FOR IV PUSH (FOR BLOOD PRESSURE SUPPORT)
PREFILLED_SYRINGE | INTRAVENOUS | Status: AC
  Filled 2017-03-18: qty 10

## 2017-03-18 MED ORDER — BUPIVACAINE-EPINEPHRINE (PF) 0.5% -1:200000 IJ SOLN
INTRAMUSCULAR | Status: DC | PRN
  Administered 2017-03-18: 30 mL via PERINEURAL

## 2017-03-18 MED ORDER — AMLODIPINE BESYLATE 5 MG PO TABS
2.5000 mg | ORAL_TABLET | Freq: Every day | ORAL | Status: DC
Start: 2017-03-19 — End: 2017-03-20
  Administered 2017-03-19 – 2017-03-20 (×2): 2.5 mg via ORAL
  Filled 2017-03-18 (×2): qty 1

## 2017-03-18 MED ORDER — GLIPIZIDE 5 MG PO TABS
10.0000 mg | ORAL_TABLET | Freq: Every day | ORAL | Status: DC
Start: 2017-03-19 — End: 2017-03-20
  Administered 2017-03-19 – 2017-03-20 (×2): 10 mg via ORAL
  Filled 2017-03-18 (×2): qty 2

## 2017-03-18 MED ORDER — CEFAZOLIN SODIUM-DEXTROSE 2-4 GM/100ML-% IV SOLN
INTRAVENOUS | Status: AC
  Filled 2017-03-18: qty 100

## 2017-03-18 MED ORDER — SODIUM CHLORIDE 0.9 % IR SOLN
Status: DC | PRN
  Administered 2017-03-18: 3000 mL

## 2017-03-18 MED ORDER — PREGABALIN 50 MG PO CAPS
50.0000 mg | ORAL_CAPSULE | Freq: Once | ORAL | Status: AC
  Administered 2017-03-18: 50 mg via ORAL
  Filled 2017-03-18: qty 1

## 2017-03-18 MED ORDER — METOCLOPRAMIDE HCL 5 MG/ML IJ SOLN
5.0000 mg | Freq: Three times a day (TID) | INTRAMUSCULAR | Status: DC | PRN
Start: 2017-03-18 — End: 2017-03-20

## 2017-03-18 SURGICAL SUPPLY — 72 items
BAG HAMPER (MISCELLANEOUS) ×3 IMPLANT
BANDAGE ACE 4X5 VEL STRL LF (GAUZE/BANDAGES/DRESSINGS) ×2 IMPLANT
BANDAGE ACE 6X5 VEL STRL LF (GAUZE/BANDAGES/DRESSINGS) ×6 IMPLANT
BANDAGE ESMARK 6X9 LF (GAUZE/BANDAGES/DRESSINGS) ×1 IMPLANT
BIT DRILL 3.2X128 (BIT) ×1 IMPLANT
BIT DRILL 3.2X128MM (BIT) ×1
BLADE HEX COATED 2.75 (ELECTRODE) ×3 IMPLANT
BLADE SAGITTAL 25.0X1.27X90 (BLADE) ×2 IMPLANT
BLADE SAGITTAL 25.0X1.27X90MM (BLADE) ×1
BNDG CMPR 9X6 STRL LF SNTH (GAUZE/BANDAGES/DRESSINGS) ×1
BNDG ESMARK 6X9 LF (GAUZE/BANDAGES/DRESSINGS) ×3
CAP KNEE TOTAL 3 SIGMA ×2 IMPLANT
CEMENT HV SMART SET (Cement) ×6 IMPLANT
CLOTH BEACON ORANGE TIMEOUT ST (SAFETY) ×3 IMPLANT
COOLER CRYO CUFF IC AND MOTOR (MISCELLANEOUS) ×3 IMPLANT
COVER LIGHT HANDLE STERIS (MISCELLANEOUS) ×6 IMPLANT
CUFF CRYO KNEE LG 20X31 COOLER (ORTHOPEDIC SUPPLIES) ×2 IMPLANT
CUFF TOURNIQUET SINGLE 34IN LL (TOURNIQUET CUFF) ×2 IMPLANT
DECANTER SPIKE VIAL GLASS SM (MISCELLANEOUS) ×3 IMPLANT
DRAPE BACK TABLE (DRAPES) ×3 IMPLANT
DRAPE EXTREMITY T 121X128X90 (DRAPE) ×3 IMPLANT
DRESSING AQUACEL AG ADV 3.5X12 (MISCELLANEOUS) ×1 IMPLANT
DRSG AQUACEL AG ADV 3.5X12 (MISCELLANEOUS) ×3
DRSG MEPILEX BORDER 4X12 (GAUZE/BANDAGES/DRESSINGS) ×3 IMPLANT
DURAPREP 26ML APPLICATOR (WOUND CARE) ×6 IMPLANT
ELECT REM PT RETURN 9FT ADLT (ELECTROSURGICAL) ×3
ELECTRODE REM PT RTRN 9FT ADLT (ELECTROSURGICAL) ×1 IMPLANT
EVACUATOR 3/16  PVC DRAIN (DRAIN) ×2
EVACUATOR 3/16 PVC DRAIN (DRAIN) ×1 IMPLANT
GLOVE BIO SURGEON STRL SZ7 (GLOVE) ×6 IMPLANT
GLOVE BIOGEL PI IND STRL 6.5 (GLOVE) IMPLANT
GLOVE BIOGEL PI IND STRL 7.0 (GLOVE) ×2 IMPLANT
GLOVE BIOGEL PI INDICATOR 6.5 (GLOVE) ×2
GLOVE BIOGEL PI INDICATOR 7.0 (GLOVE) ×4
GLOVE SKINSENSE NS SZ8.0 LF (GLOVE) ×4
GLOVE SKINSENSE STRL SZ8.0 LF (GLOVE) ×2 IMPLANT
GLOVE SS N UNI LF 8.5 STRL (GLOVE) ×3 IMPLANT
GOWN STRL REUS W/ TWL LRG LVL3 (GOWN DISPOSABLE) ×1 IMPLANT
GOWN STRL REUS W/TWL LRG LVL3 (GOWN DISPOSABLE) ×9 IMPLANT
GOWN STRL REUS W/TWL XL LVL3 (GOWN DISPOSABLE) ×3 IMPLANT
HANDPIECE INTERPULSE COAX TIP (DISPOSABLE) ×3
HOOD W/PEELAWAY (MISCELLANEOUS) ×12 IMPLANT
INST SET MAJOR BONE (KITS) ×3 IMPLANT
IV NS IRRIG 3000ML ARTHROMATIC (IV SOLUTION) ×3 IMPLANT
KIT BLADEGUARD II DBL (SET/KITS/TRAYS/PACK) ×3 IMPLANT
KIT ROOM TURNOVER APOR (KITS) ×3 IMPLANT
MANIFOLD NEPTUNE II (INSTRUMENTS) ×3 IMPLANT
MARKER SKIN DUAL TIP RULER LAB (MISCELLANEOUS) ×3 IMPLANT
NDL HYPO 21X1.5 SAFETY (NEEDLE) ×1 IMPLANT
NEEDLE HYPO 21X1.5 SAFETY (NEEDLE) ×3 IMPLANT
NS IRRIG 1000ML POUR BTL (IV SOLUTION) ×3 IMPLANT
PACK TOTAL JOINT (CUSTOM PROCEDURE TRAY) ×3 IMPLANT
PAD ARMBOARD 7.5X6 YLW CONV (MISCELLANEOUS) ×5 IMPLANT
PAD DANNIFLEX CPM (ORTHOPEDIC SUPPLIES) ×3 IMPLANT
PIN TROCAR 3 INCH (PIN) ×2 IMPLANT
SAW OSC TIP CART 19.5X105X1.3 (SAW) ×3 IMPLANT
SET BASIN LINEN APH (SET/KITS/TRAYS/PACK) ×3 IMPLANT
SET HNDPC FAN SPRY TIP SCT (DISPOSABLE) ×1 IMPLANT
STAPLER VISISTAT 35W (STAPLE) ×5 IMPLANT
SUT BRALON NAB BRD #1 30IN (SUTURE) ×6 IMPLANT
SUT MNCRL 0 VIOLET CTX 36 (SUTURE) ×1 IMPLANT
SUT MON AB 0 CT1 (SUTURE) ×5 IMPLANT
SUT MON AB 2-0 CT1 36 (SUTURE) IMPLANT
SUT MONOCRYL 0 CTX 36 (SUTURE) ×2
SYR 20CC LL (SYRINGE) IMPLANT
SYR 30ML LL (SYRINGE) ×3 IMPLANT
SYR BULB IRRIGATION 50ML (SYRINGE) ×3 IMPLANT
TOWEL OR 17X26 4PK STRL BLUE (TOWEL DISPOSABLE) ×3 IMPLANT
TOWER CARTRIDGE SMART MIX (DISPOSABLE) ×3 IMPLANT
TRAY FOLEY W/METER SILVER 16FR (SET/KITS/TRAYS/PACK) ×3 IMPLANT
WATER STERILE IRR 1000ML POUR (IV SOLUTION) ×6 IMPLANT
YANKAUER SUCT 12FT TUBE ARGYLE (SUCTIONS) ×3 IMPLANT

## 2017-03-18 NOTE — Anesthesia Preprocedure Evaluation (Signed)
Anesthesia Evaluation  Patient identified by MRN, date of birth, ID band Patient awake    Reviewed: Allergy & Precautions, H&P , NPO status , Patient's Chart, lab work & pertinent test results  Airway Mallampati: II  TM Distance: >3 FB     Dental  (+) Teeth Intact   Pulmonary neg pulmonary ROS,    breath sounds clear to auscultation       Cardiovascular hypertension, Pt. on medications  Rhythm:Regular Rate:Normal     Neuro/Psych  Headaches, Seizures -,  PSYCHIATRIC DISORDERS Depression    GI/Hepatic negative GI ROS, GERD  Medicated and Controlled,  Endo/Other  diabetes, Well Controlled, Type 2, Oral Hypoglycemic Agents  Renal/GU Renal disease     Musculoskeletal   Abdominal   Peds  Hematology   Anesthesia Other Findings   Reproductive/Obstetrics                             Anesthesia Physical Anesthesia Plan  ASA: III  Anesthesia Plan: Spinal   Post-op Pain Management:    Induction:   Airway Management Planned: Simple Face Mask  Additional Equipment:   Intra-op Plan:   Post-operative Plan:   Informed Consent: I have reviewed the patients History and Physical, chart, labs and discussed the procedure including the risks, benefits and alternatives for the proposed anesthesia with the patient or authorized representative who has indicated his/her understanding and acceptance.     Plan Discussed with:   Anesthesia Plan Comments:         Anesthesia Quick Evaluation

## 2017-03-18 NOTE — Progress Notes (Signed)
Physical Therapy Treatment Patient Details Name: Kelli Spencer MRN: [id number] DOB: September 18, 1966 Today's Date: 03/18/2017    History of Present Illness S/P R TKA on 03/18/2017    PT Comments    Pt received in bed, family initially present, but left for evaluation.  Pt expressed that she is normally independent with ambulation, ADL's, and IADL's.  During PT evaluation, she expresses a high level of pain, and RN present to administer IV pain medication.  Despite this pain, she was able to activate her R quadricep well, and perform TKA exercises well.  She demonstrated supine<>sit at supervision level, and sit<>Stand with RW at [facility] guard level.  She ambulated 38ft with RW and Min guard.  She is recommended to return home with HHPT, RW, and BSC upon d/c.    # OF FEET WALKED: 82ft with RW and min guard ROM:  Flexion: 60* - limited by pain            Extension: -11*    Follow Up Recommendations  Home health PT     Equipment Recommendations  Rolling walker with 5" wheels;3in1 (PT)    Recommendations for Other Services       Precautions / Restrictions Precautions Precautions: Fall Precaution Comments: Due to surgery Restrictions Weight Bearing Restrictions: No    Mobility  Bed Mobility Overal bed mobility: Modified Independent                Transfers Overall transfer level: Needs assistance Equipment used: Rolling walker (2 wheeled) Transfers: Sit to/from Stand Sit to Stand: Supervision;Min guard         General transfer comment: vc's for hand placement.   Ambulation/Gait Ambulation/Gait assistance: Min guard Ambulation Distance (Feet): 30 Feet Assistive device: Rolling walker (2 wheeled) Gait Pattern/deviations: Step-to pattern;Trunk flexed;Antalgic     General Gait Details: further distance limited due to pain.    Stairs            Wheelchair Mobility    Modified Rankin (Stroke Patients Only)       Balance Overall balance assessment: Needs  assistance Sitting-balance support: Bilateral upper extremity supported;Feet supported Sitting balance-Leahy Scale: Normal     Standing balance support: Bilateral upper extremity supported Standing balance-Leahy Scale: Fair                              Cognition Arousal/Alertness: Awake/alert Behavior During Therapy: WFL for tasks assessed/performed Overall Cognitive Status: Within Functional Limits for tasks assessed                                        Exercises Total Joint Exercises Ankle Circles/Pumps: AROM;Both;Supine Quad Sets: Strengthening;Both;10 reps;Supine Short Arc Quad: Strengthening;Right;5 reps;Supine Straight Leg Raises: Strengthening;Right;5 reps;Supine Goniometric ROM: R knee lacking 11* from full extension, and only able to obtain 60* of flexion - limited due to pain and bulkiness of ace wrap.     General Comments        Pertinent Vitals/Pain Pain Assessment: 0-10 Pain Score: 10-Worst pain ever Pain Location: R knee - improved when ice pack was removed.  Pain Intervention(s): Limited activity within patient's tolerance;Monitored during session;Premedicated before session;Repositioned;Relaxation;Ice applied    Home Living Family/patient expects to be discharged to:: Private residence Living Arrangements: Children (son - works 3rd shift)   Type of Home: House Home Access: Stairs to enter  Home Layout: One level Home Equipment: Walker - standard;Cane - single point      Prior Function Level of Independence: Independent  Gait / Transfers Assistance Needed: independent ADL's / Homemaking Assistance Needed: independent     PT Goals (current goals can now be found in the care plan section) Acute Rehab PT Goals Patient Stated Goal: Pt wants to go home.  PT Goal Formulation: With patient Time For Goal Achievement: 03/25/17 Potential to Achieve Goals: Good    Frequency    BID      PT Plan      Co-evaluation               AM-PAC PT "6 Clicks" Daily Activity  Outcome Measure  Difficulty turning over in bed (including adjusting bedclothes, sheets and blankets)?: None Difficulty moving from lying on back to sitting on the side of the bed? : None Difficulty sitting down on and standing up from a chair with arms (e.g., wheelchair, bedside commode, etc,.)?: A Little Help needed moving to and from a bed to chair (including a wheelchair)?: A Little Help needed walking in hospital room?: A Little Help needed climbing 3-5 steps with a railing? : A Little 6 Click Score: 20    End of Session Equipment Utilized During Treatment: Gait belt Activity Tolerance: Patient limited by pain Patient left: in chair;with call bell/phone within reach Nurse Communication: Mobility status Magda Paganini, RN notified of pt's mobility status, and mobiltiy sheet left hanging in the room. ) PT Visit Diagnosis: Other abnormalities of gait and mobility (R26.89);Muscle weakness (generalized) (M62.81);History of falling (Z91.81);Unsteadiness on feet (R26.81)     Time: 3716-9678 PT Time Calculation (min) (ACUTE ONLY): 39 min  Charges:  $Gait Training: 8-22 mins $Therapeutic Exercise: 8-22 mins                    G Codes:  Functional Assessment Tool Used: AM-PAC 6 Clicks Basic Mobility;Clinical judgement Functional Limitation: Mobility: Walking and moving around Mobility: Walking and Moving Around Current Status (L3810): At least 20 percent but less than 40 percent impaired, limited or restricted Mobility: Walking and Moving Around Goal Status [phone]): At least 1 percent but less than 20 percent impaired, limited or restricted    Beth Krysta [place]field, PT, DPT X: [phone]

## 2017-03-18 NOTE — Transfer of Care (Signed)
Immediate Anesthesia Transfer of Care Note  Patient: Kelli Spencer  Procedure(s) Performed: Procedure(s): TOTAL KNEE ARTHROPLASTY (Right)  Patient Location: PACU  Anesthesia Type:Spinal  Level of Consciousness: awake, alert  and oriented  Airway & Oxygen Therapy: Patient Spontanous Breathing and Patient connected to nasal cannula oxygen  Post-op Assessment: Report given to RN  Post vital signs: Reviewed and stable  Last Vitals:  Vitals:   03/18/17 0715 03/18/17 0720  BP: 121/72 115/71  Pulse:    Resp: 18 (!) 21  Temp:      Last Pain: There were no vitals filed for this visit.    Patients Stated Pain Goal: 8 (86/82/57 [id number])  Complications: No apparent anesthesia complications

## 2017-03-18 NOTE — Op Note (Signed)
03/18/2017  9:52 AM  PATIENT:  Kelli Spencer  51 y.o. female  PRE-OPERATIVE DIAGNOSIS:  RIGHT KNEE OSTEOARTHRITIS  POST-OPERATIVE DIAGNOSIS:  RIGHT KNEE OSTEOARTHRITIS  Operative findings varus alignment to the knee mild, osteoarthritis severe medial compartment and patellofemoral compartment lateral compartment was normal anterior cruciate ligament PCL and lateral meniscus were intact medial meniscus was frayed but intact  Implants Sigma fixed bearing depuy size 3 femur, 2.5 tibia, 32 patella with 8 mm depth and 10 mm PS polyethylene  Assisted by Simonne Maffucci and Marquita Palms  Spinal anesthesia  PROCEDURE:  Procedure(s): TOTAL KNEE ARTHROPLASTY (Right) [phone]  SURGEON:  Surgeon(s) and Role:    Carole Civil, MD - Primary  Surgeon Aline Brochure [phone]   Details of procedure   The patient was identified in the preop holding area and the surgical site was confirmed as the right knee. Chart review and update were completed. The patient was taken to the operating room for spinal anesthesia. After successful spinal anesthesia Foley catheter was inserted. The patient was placed supine on the operating table.   the right leg was prepped with DuraPrep and draped sterilely. Timeout was completed. The limb was then exsanguinated a  6 inch Esmarch. The tourniquet was elevated to 300 mmHg.   A midline incision was made and taken down to the extensor mechanism followed by medial arthrotomy. The patella was everted. A synovectomy was performed as needed. The osteophytes were resected.  Anterior cruciate ligament and PCL and medial and lateral meniscus were resected.   a 3/8 inch drill bit was used to enter the femoral canal which was suctioned and irrigated until the fluid was clear. The distal femoral cut was set for 11 millimeter resection with a 5   Left Valgus angle. This cut was completed and checked for flatness.   the femur was then measured to a size 3.  The cutting block  was placed to match the epicondyles and the 4 distal cuts were made.   the tibia was subluxated forward and the external alignment guide was placed. We removed 10 mm of bone from the higher  Lateral side side. We set the guide for neutral varus valgus cut related to the  Mechanical axis of the tibia and for slope matching the patient's anatomy. Rotational alignment was set using the tibial tubercle, tibial spine and second metatarsal. The cutting block was pinned and the proximal tibia was resected.    spacer blocks were placed starting with a 10 mm insert to confirm equal flexion-extension gaps. A size  10  mm insert balanced the gaps.   We placed the femoral notch cutting guide size 3  and resected the notch.   Trial implants were placed using appropriate size femur , appropriate size tibial baseplate which was measured after the proximal tibia resection. Tibial rotation was set patella tracking was normal   The tibia was then punched per manufacture technique making sure to avoid internal rotation.   The patella measured a size 26mm   We resected down to a size 13 using a size 32 x 8 button.   Final range of motion check was performed with the appropriate size trials as mentioned above. Satisfactory reduction and motion were obtained.   Trial implants were removed. The bone was irrigated and dried and the cement was mixed on the back table  exparel was injected in the soft tissues and posterior capsule of the knee  These implants were then cemented in place. Excess cement  was removed. The cement was allowed to cure. Second irrigation was performed.    FInal range of motion check and stability check was completed  The wound was irrigated third time Hemovac drain was placed, extensor mechanism was closed with #1 Nurolon followed by 0 Monocryl and staples to reapproximate the skin edges and subcutaneous tissue.   Sterile dressing was applied  The patient was taken recovery in stable  condition    EBL:  Total I/O In: 1400 [I.V.:1400] Out: 150 [Urine:125; Blood:25]  BLOOD ADMINISTERED:none  DRAINS: One Hemovac drain  30 mL of Marcaine with epinephrine  20 mL of exparel diluted with 40 mL of saline   No specimens   Counts were correct    TOURNIQUET:   Total Tourniquet Time Documented: Thigh (Right) - -81 minutes Total: Thigh (Right) - -81 minutes    Dragon Dictation  PLAN OF CARE: Admit to inpatient   PATIENT DISPOSITION:  PACU - hemodynamically stable.   Delay start of Pharmacological VTE agent (>24hrs) due to surgical blood loss or risk of bleeding: yes

## 2017-03-18 NOTE — Anesthesia Postprocedure Evaluation (Signed)
Anesthesia Post Note  Patient: Kelli Spencer  Procedure(s) Performed: Procedure(s) (LRB): TOTAL KNEE ARTHROPLASTY (Right)  Patient location during evaluation: PACU Anesthesia Type: Spinal Level of consciousness: awake and alert and oriented Pain management: pain level controlled Vital Signs Assessment: post-procedure vital signs reviewed and stable Respiratory status: spontaneous breathing Cardiovascular status: blood pressure returned to baseline Postop Assessment: spinal receding and no signs of nausea or vomiting Anesthetic complications: no     Last Vitals:  Vitals:   03/18/17 1045 03/18/17 1051  BP: 93/71   Pulse: 77 71  Resp: 17 15  Temp:      Last Pain: There were no vitals filed for this visit.               Torri Langston

## 2017-03-18 NOTE — Anesthesia Procedure Notes (Signed)
Spinal  Patient location during procedure: OR Start time: 03/18/2017 7:50 AM Staffing Resident/CRNA: Tressie Stalker E Preanesthetic Checklist Completed: patient identified, site marked, surgical consent, pre-op evaluation, timeout performed, IV checked, risks and benefits discussed and monitors and equipment checked Spinal Block Patient position: right lateral decubitus Prep: Betadine Patient monitoring: heart rate, cardiac monitor, continuous pulse ox and blood pressure Approach: right paramedian Location: L3-4 Injection technique: single-shot Needle Needle type: Spinocan  Needle gauge: 22 G Needle length: 9 cm Assessment Sensory level: T8 Additional Notes ATTEMPTS:1 TRAY HU:[id number] TRAY EXPIRATION DATE:11/02/2017

## 2017-03-18 NOTE — Brief Op Note (Signed)
03/18/2017  9:52 AM  PATIENT:  Kelli Spencer  51 y.o. female  PRE-OPERATIVE DIAGNOSIS:  RIGHT KNEE OSTEOARTHRITIS  POST-OPERATIVE DIAGNOSIS:  RIGHT KNEE OSTEOARTHRITIS  Operative findings varus alignment to the knee mild, osteoarthritis severe medial compartment and patellofemoral compartment lateral compartment was normal anterior cruciate ligament PCL and lateral meniscus were intact medial meniscus was frayed but intact  Implants Sigma fixed bearing depuy size 3 femur, 2.5 tibia, 32 patella with 8 mm depth and 10 mm PS polyethylene  Assisted by Simonne Maffucci and Marquita Palms  Spinal anesthesia  PROCEDURE:  Procedure(s): TOTAL KNEE ARTHROPLASTY (Right) [phone]  SURGEON:  Surgeon(s) and Role:    Carole Civil, MD - Primary  Surgeon Aline Brochure [phone]   Details of procedure   The patient was identified in the preop holding area and the surgical site was confirmed as the right knee. Chart review and update were completed. The patient was taken to the operating room for spinal anesthesia. After successful spinal anesthesia Foley catheter was inserted. The patient was placed supine on the operating table.   the right leg was prepped with DuraPrep and draped sterilely. Timeout was completed. The limb was then exsanguinated a  6 inch Esmarch. The tourniquet was elevated to 300 mmHg.   A midline incision was made and taken down to the extensor mechanism followed by medial arthrotomy. The patella was everted. A synovectomy was performed as needed. The osteophytes were resected.  Anterior cruciate ligament and PCL and medial and lateral meniscus were resected.   a 3/8 inch drill bit was used to enter the femoral canal which was suctioned and irrigated until the fluid was clear. The distal femoral cut was set for 11 millimeter resection with a 5   Left Valgus angle. This cut was completed and checked for flatness.   the femur was then measured to a size 3.  The cutting block  was placed to match the epicondyles and the 4 distal cuts were made.   the tibia was subluxated forward and the external alignment guide was placed. We removed 10 mm of bone from the higher  Lateral side side. We set the guide for neutral varus valgus cut related to the  Mechanical axis of the tibia and for slope matching the patient's anatomy. Rotational alignment was set using the tibial tubercle, tibial spine and second metatarsal. The cutting block was pinned and the proximal tibia was resected.    spacer blocks were placed starting with a 10 mm insert to confirm equal flexion-extension gaps. A size  10  mm insert balanced the gaps.   We placed the femoral notch cutting guide size 3  and resected the notch.   Trial implants were placed using appropriate size femur , appropriate size tibial baseplate which was measured after the proximal tibia resection. Tibial rotation was set patella tracking was normal   The tibia was then punched per manufacture technique making sure to avoid internal rotation.   The patella measured a size 48mm   We resected down to a size 13 using a size 32 x 8 button.   Final range of motion check was performed with the appropriate size trials as mentioned above. Satisfactory reduction and motion were obtained.   Trial implants were removed. The bone was irrigated and dried and the cement was mixed on the back table  exparel was injected in the soft tissues and posterior capsule of the knee  These implants were then cemented in place. Excess cement  was removed. The cement was allowed to cure. Second irrigation was performed.    FInal range of motion check and stability check was completed  The wound was irrigated third time Hemovac drain was placed, extensor mechanism was closed with #1 Nurolon followed by 0 Monocryl and staples to reapproximate the skin edges and subcutaneous tissue.   Sterile dressing was applied  The patient was taken recovery in stable  condition    EBL:  Total I/O In: 1400 [I.V.:1400] Out: 150 [Urine:125; Blood:25]  BLOOD ADMINISTERED:none  DRAINS: One Hemovac drain  30 mL of Marcaine with epinephrine  20 mL of exparel diluted with 40 mL of saline   No specimens   Counts were correct    TOURNIQUET:   Total Tourniquet Time Documented: Thigh (Right) - -81 minutes Total: Thigh (Right) - -81 minutes    Dragon Dictation  PLAN OF CARE: Admit to inpatient   PATIENT DISPOSITION:  PACU - hemodynamically stable.   Delay start of Pharmacological VTE agent (>24hrs) due to surgical blood loss or risk of bleeding: yes

## 2017-03-18 NOTE — Interval H&P Note (Signed)
History and Physical Interval Note:  03/18/2017 7:19 AM  BP 124/69   Pulse 77   Temp 98.3 F (36.8 C)   Resp 14   Ht 5' (1.524 m)   Wt 198 lb (89.8 kg)   LMP 06/10/1988 (Approximate)   SpO2 98%   BMI 38.67 kg/m    Kelli Spencer  has presented today for surgery, with the diagnosis of RIGHT KNEE OSTEOARTHRITIS  The various methods of treatment have been discussed with the patient and family. After consideration of risks, benefits and other options for treatment, the patient has consented to  Procedure(s): TOTAL KNEE ARTHROPLASTY (Right) as a surgical intervention .  The patient's history has been reviewed, patient examined,  the surgical site skin is normal no change in status, stable for surgery.  I have reviewed the patient's chart and labs.  Questions were answered to the patient's satisfaction.    CBC Latest Ref Rng & Units 03/13/2017 02/07/2014 06/07/2013  WBC 4.0 - 10.5 K/uL 8.2 - 6.5  Hemoglobin 12.0 - 15.0 g/dL 12.5 11.8(L) 14.3  Hematocrit 36.0 - 46.0 % 37.9 35.0(L) 39.4  Platelets 150 - 400 K/uL 369 - 280   BMP Latest Ref Rng & Units 03/13/2017 12/04/2016 07/21/2016  Glucose 65 - 99 mg/dL 125(H) 108(H) 130(H)  BUN 6 - 20 mg/dL 22(H) 24 22  Creatinine 0.44 - 1.00 mg/dL 1.33(H) 1.32(H) 1.11(H)  Sodium 135 - 145 mmol/L 141 140 138  Potassium 3.5 - 5.1 mmol/L 4.0 4.6 4.7  Chloride 101 - 111 mmol/L 105 104 103  CO2 22 - 32 mmol/L 26 24 26   Calcium 8.9 - 10.3 mg/dL 9.8 9.7 9.6    Arther Abbott

## 2017-03-19 LAB — CBC
HCT: 32.4 % — ABNORMAL LOW (ref 36.0–46.0)
HEMOGLOBIN: 10.3 g/dL — AB (ref 12.0–15.0)
MCH: 28.8 pg (ref 26.0–34.0)
MCHC: 31.8 g/dL (ref 30.0–36.0)
MCV: 90.5 fL (ref 78.0–100.0)
Platelets: 275 10*3/uL (ref 150–400)
RBC: 3.58 MIL/uL — AB (ref 3.87–5.11)
RDW: 12.9 % (ref 11.5–15.5)
WBC: 10.3 10*3/uL (ref 4.0–10.5)

## 2017-03-19 LAB — BASIC METABOLIC PANEL
Anion gap: 7 (ref 5–15)
BUN: 15 mg/dL (ref 6–20)
CHLORIDE: 103 mmol/L (ref 101–111)
CO2: 26 mmol/L (ref 22–32)
Calcium: 8.7 mg/dL — ABNORMAL LOW (ref 8.9–10.3)
Creatinine, Ser: 1.15 mg/dL — ABNORMAL HIGH (ref 0.44–1.00)
GFR calc Af Amer: >60 mL/min (ref >60)
GFR calc non Af Amer: 54 mL/min — ABNORMAL LOW (ref >60)
Glucose, Bld: 189 mg/dL — ABNORMAL HIGH (ref 65–99)
POTASSIUM: 4.4 mmol/L (ref 3.5–5.1)
SODIUM: 136 mmol/L (ref 135–145)

## 2017-03-19 LAB — GLUCOSE, CAPILLARY
GLUCOSE-CAPILLARY: 150 mg/dL — AB (ref 65–99)
Glucose-Capillary: 122 mg/dL — ABNORMAL HIGH (ref 65–99)
Glucose-Capillary: 164 mg/dL — ABNORMAL HIGH (ref 65–99)

## 2017-03-19 NOTE — Progress Notes (Signed)
Physical Therapy Treatment Patient Details Name: Kelli Spencer MRN: [id number] DOB: April 16, 1966 Today's Date: 03/19/2017    History of Present Illness S/P R TKA on 03/18/2017    PT Comments    Pt received in bed, and was agreeable to PT tx.  Pt continues to state that she is having intense pain, but she is mobilizing well.  She ambulated 124f with RW and Mod (I), and negotiated 4 steps with supervision.  She has met all PT functional goals, and is cleared to d/c home with HHPT, RW and BSC.  # OF FEET WALKED: 1627fwith RW and Mod (I), 4 steps with supervision ROM:  Flexion: 60* - limited due to pain and guarding            Extension: 5*   Follow Up Recommendations  Home health PT     Equipment Recommendations  Rolling walker with 5" wheels;3in1 (PT)    Recommendations for Other Services       Precautions / Restrictions Precautions Precautions: Fall Precaution Comments: Due to surgery Restrictions Weight Bearing Restrictions: No    Mobility  Bed Mobility Overal bed mobility: Modified Independent                Transfers Overall transfer level: Modified independent Equipment used: Rolling walker (2 wheeled) Transfers: Sit to/from Stand              Ambulation/Gait Ambulation/Gait assistance: Modified independent (Device/Increase time) Ambulation Distance (Feet): 160 Feet Assistive device: Rolling walker (2 wheeled) Gait Pattern/deviations: Step-through pattern;Antalgic         Stairs Stairs: Yes   Stair Management: One rail Right;Step to pattern;Forwards Number of Stairs: 4    Wheelchair Mobility    Modified Rankin (Stroke Patients Only)       Balance Overall balance assessment: Needs assistance Sitting-balance support: Bilateral upper extremity supported;Feet supported Sitting balance-Leahy Scale: Normal     Standing balance support: Bilateral upper extremity supported Standing balance-Leahy Scale: Fair                               Cognition Arousal/Alertness: Awake/alert Behavior During Therapy: WFL for tasks assessed/performed Overall Cognitive Status: Within Functional Limits for tasks assessed                                        Exercises Total Joint Exercises Goniometric ROM: R knee lacking 5* from full extension, and able to achieve 60* of flexion actively - continues to be limited due to pain and guarding.      General Comments        Pertinent Vitals/Pain Pain Assessment: 0-10 Pain Score: 8  Pain Location: R knee  Pain Descriptors / Indicators: Aching Pain Intervention(s): Limited activity within patient's tolerance;Monitored during session;Premedicated before session;Repositioned    Home Living                      Prior Function            PT Goals (current goals can now be found in the care plan section) Acute Rehab PT Goals Patient Stated Goal: Pt wants to go home.  PT Goal Formulation: With patient Time For Goal Achievement: 03/25/17 Potential to Achieve Goals: Good Progress towards PT goals: Progressing toward goals    Frequency    BID  PT Plan      Co-evaluation              AM-PAC PT "6 Clicks" Daily Activity  Outcome Measure  Difficulty turning over in bed (including adjusting bedclothes, sheets and blankets)?: None Difficulty moving from lying on back to sitting on the side of the bed? : None Difficulty sitting down on and standing up from a chair with arms (e.g., wheelchair, bedside commode, etc,.)?: None Help needed moving to and from a bed to chair (including a wheelchair)?: A Little Help needed walking in hospital room?: A Little Help needed climbing 3-5 steps with a railing? : A Little 6 Click Score: 21    End of Session Equipment Utilized During Treatment: Gait belt Activity Tolerance: Patient limited by pain Patient left: in chair;with call bell/phone within reach;in CPM Nurse Communication: Mobility  status PT Visit Diagnosis: Other abnormalities of gait and mobility (R26.89);Muscle weakness (generalized) (M62.81);History of falling (Z91.81);Unsteadiness on feet (R26.81)     Time: 5027-7412 PT Time Calculation (min) (ACUTE ONLY): 32 min  Charges:  $Gait Training: 8-22 mins $Therapeutic Activity: 8-22 mins                    G Codes:       Beth Everet Flagg, PT, DPT X: [phone]

## 2017-03-19 NOTE — Progress Notes (Signed)
OT Screen  Patient Details Name: Kelli Spencer MRN: [id number] DOB: 1966-01-09   Cancelled Treatment:     Reason evaluation/treatment not completed: Pt screened for OT needs. Pt received supine in bed, performs bed mobility with supervision, bed<>chair transfer with min guard this am. Pt reports she went to restroom this am with nsg, did not use RW however did have hand-held assist from nsg staff. Pt reports she has family/friends available to assist with ADL tasks as needed on discharge. Discussed LB dressing techniques, pt reports she wears shoes without socks so donning socks will not be an issue in the immediate future. Also discussed safety with tub transfer and functional mobility, pt verbalized understanding. No further OT needs at this time.    Guadelupe Sabin, OTR/L  [phone] 03/19/2017, 8:48 AM

## 2017-03-19 NOTE — Anesthesia Postprocedure Evaluation (Signed)
Anesthesia Post Note  Patient: Kelli Spencer  Procedure(s) Performed: Procedure(s) (LRB): TOTAL KNEE ARTHROPLASTY (Right)  Patient location during evaluation: PACU Anesthesia Type: Spinal Level of consciousness: awake and alert and oriented Pain management: satisfactory to patient Vital Signs Assessment: post-procedure vital signs reviewed and stable Respiratory status: spontaneous breathing Cardiovascular status: blood pressure returned to baseline and stable Postop Assessment: no backache, no headache, patient able to bend at knees, no signs of nausea or vomiting and adequate PO intake Anesthetic complications: no     Last Vitals:  Vitals:   03/19/17 0400 03/19/17 0800  BP: 125/73 (!) 108/42  Pulse: 76 69  Resp: 16 17  Temp: 37.2 C 36.8 C    Last Pain:  Vitals:   03/19/17 0800  TempSrc: Oral  PainSc:                  Kelli Spencer

## 2017-03-19 NOTE — Care Management Note (Signed)
Case Management Note  Patient Details  Name: TAIMA RADA MRN: [id number] Date of Birth: 03/09/1966  Subjective/Objective:                  Pt s/p TKR. Pt from home, lives alone and was ind with ADL's pta. She plans to return home, her son will stay with her for a short while and provide assistance as needed. She will need HH PT, CPM, 3 IN 1 and RW. Pt has no preference of DME agency as long as they are in network with [facility]. HH has already been referred to [facility]. Pt okay with this.   Action/Plan: DME referrals sent to [facility]. They will deliver RW to pt room prior to DC and 3 in 1 and CPM to pt home after DC. Tim Justis, [facility] Rep, aware of referral and DC planned for 03/20/2017. CM will follow to DC.   Expected Discharge Date:  03/21/17               Expected Discharge Plan:  [place]  In-House Referral:  NA  Discharge planning Services  CM Consult  Post Acute Care Choice:  Durable Medical Equipment, Home Health Choice offered to:  Patient  HH Arranged:  RN, PT [place] Agency:  [facility] (formerly [facility]  Status of Service:  Completed, signed off  Sherald Barge, RN 03/19/2017, 11:29 AM

## 2017-03-19 NOTE — Plan of Care (Signed)
Problem: Pain Managment: Goal: General experience of comfort will improve Outcome: Adequate for Discharge Pt receiving Dilaudid 0.5mg  q2h PRN, and scheduled Norco. Tolerating well. Pain level between 7-8/10. Pt moving herself in  Bed and resting quietly. RN monitoring pain level on a routine basis.

## 2017-03-19 NOTE — Progress Notes (Signed)
Patient ID: Kelli Spencer, female   DOB: 1966/08/26, 51 y.o.   MRN: [id number]  BP (!) 108/42 (BP Location: Left Arm)   Pulse 69   Temp 98.2 F (36.8 C) (Oral)   Resp 17   Ht 5' (1.524 m)   Wt 198 lb (89.8 kg)   LMP 06/10/1988 (Approximate)   SpO2 97%   BMI 38.67 kg/m   POD 1 RT TKA   LOOKS REALLY GOOD  DRAIN REMOVED;   IVFS DECREASED   [facility] PT

## 2017-03-19 NOTE — Progress Notes (Signed)
Physical Therapy Treatment Patient Details Name: Kelli Spencer MRN: [id number] DOB: [birth year] Today's Date: 03/19/2017    History of Present Illness S/P R TKA on 03/18/2017    PT Comments    Pt received in bed, and was agreeable to PT evaluation.  Pt demonstrates imropoved tolerance for exercises.  She also demonstrated improved quality of gait and distance up to 183ft with RW and Mod (I).  Pt demonstrates functional improvements with all mobility, will obtain AROM measurements this PM, and have pt practice stair negotiation.  Continue to recommend HHPT, RW and BSC upon d/c.   # OF FEET WALKED: 129ft with RW and Modified independent ROM:  Flexion: Will obtain measurements during PM session today            Extension:    Follow Up Recommendations  Home health PT     Equipment Recommendations  Rolling walker with 5" wheels;3in1 (PT)    Recommendations for Other Services       Precautions / Restrictions Precautions Precautions: Fall Precaution Comments: Due to surgery Restrictions Weight Bearing Restrictions: No    Mobility  Bed Mobility Overal bed mobility: Modified Independent                Transfers Overall transfer level: Needs assistance Equipment used: Rolling walker (2 wheeled) Transfers: Sit to/from Stand Sit to Stand: Modified independent (Device/Increase time)         General transfer comment: vc's for hand placement.   Ambulation/Gait Ambulation/Gait assistance: Modified independent (Device/Increase time) Ambulation Distance (Feet): 150 Feet   Gait Pattern/deviations: Step-through pattern;Antalgic     General Gait Details: increased lateral sway.  Educated pt on increasing knee flexion during limb advancement.   Stairs            Wheelchair Mobility    Modified Rankin (Stroke Patients Only)       Balance Overall balance assessment: Needs assistance Sitting-balance support: Bilateral upper extremity supported;Feet  supported Sitting balance-Leahy Scale: Normal     Standing balance support: Bilateral upper extremity supported Standing balance-Leahy Scale: Fair                              Cognition Arousal/Alertness: Awake/alert Behavior During Therapy: WFL for tasks assessed/performed Overall Cognitive Status: Within Functional Limits for tasks assessed                                        Exercises Total Joint Exercises Ankle Circles/Pumps: AROM;Both;Supine Quad Sets: Strengthening;Both;10 reps;Supine Short Arc Quad: Strengthening;Right;Supine;10 reps Heel Slides: AROM;Right;10 reps;Supine Straight Leg Raises: Strengthening;Right;Supine;10 reps Goniometric ROM: R knee lacking 5* from full extension    General Comments        Pertinent Vitals/Pain Pain Assessment: 0-10 Pain Score: 8  Pain Location: R knee - but moving well Pain Descriptors / Indicators: Aching Pain Intervention(s): Limited activity within patient's tolerance;Monitored during session;Premedicated before session;Repositioned;Ice applied    Home Living                      Prior Function            PT Goals (current goals can now be found in the care plan section) Acute Rehab PT Goals Patient Stated Goal: Pt wants to go home.  PT Goal Formulation: With patient Time For Goal Achievement: 03/25/17 Potential to Achieve  Goals: Good Progress towards PT goals: Progressing toward goals    Frequency    BID      PT Plan      Co-evaluation              AM-PAC PT "6 Clicks" Daily Activity  Outcome Measure  Difficulty turning over in bed (including adjusting bedclothes, sheets and blankets)?: None Difficulty moving from lying on back to sitting on the side of the bed? : None Difficulty sitting down on and standing up from a chair with arms (e.g., wheelchair, bedside commode, etc,.)?: None Help needed moving to and from a bed to chair (including a wheelchair)?: A  Little Help needed walking in hospital room?: A Little Help needed climbing 3-5 steps with a railing? : A Little 6 Click Score: 21    End of Session Equipment Utilized During Treatment: Gait belt Activity Tolerance: Patient limited by pain   Nurse Communication: Mobility status PT Visit Diagnosis: Other abnormalities of gait and mobility (R26.89);Muscle weakness (generalized) (M62.81);History of falling (Z91.81);Unsteadiness on feet (R26.81)     Time: 3833-3832 PT Time Calculation (min) (ACUTE ONLY): 24 min  Charges:  $Gait Training: 8-22 mins $Therapeutic Exercise: 8-22 mins                    G Codes:       Beth Taralynn Quiett, PT, DPT X: [phone]

## 2017-03-19 NOTE — Clinical Social Work Note (Signed)
Patient is going home with HHPT. CM is aware and has made arrangements.   CSW signing off.       Nakkia Mackiewicz, Clydene Pugh, LCSW

## 2017-03-19 NOTE — Addendum Note (Signed)
Addendum  created 03/19/17 0815 by Ollen Bowl, CRNA   Sign clinical note

## 2017-03-20 ENCOUNTER — Encounter (HOSPITAL_COMMUNITY): Payer: Self-pay | Admitting: Orthopedic Surgery

## 2017-03-20 LAB — CBC
HEMATOCRIT: 32 % — AB (ref 36.0–46.0)
HEMOGLOBIN: 10.3 g/dL — AB (ref 12.0–15.0)
MCH: 28.9 pg (ref 26.0–34.0)
MCHC: 32.2 g/dL (ref 30.0–36.0)
MCV: 89.9 fL (ref 78.0–100.0)
Platelets: 267 10*3/uL (ref 150–400)
RBC: 3.56 MIL/uL — ABNORMAL LOW (ref 3.87–5.11)
RDW: 13.3 % (ref 11.5–15.5)
WBC: 9.1 10*3/uL (ref 4.0–10.5)

## 2017-03-20 LAB — GLUCOSE, CAPILLARY: Glucose-Capillary: 127 mg/dL — ABNORMAL HIGH (ref 65–99)

## 2017-03-20 MED ORDER — ASPIRIN 325 MG PO TBEC: 325.0000 mg | DELAYED_RELEASE_TABLET | Freq: Every day | ORAL | 0 refills | Status: DC

## 2017-03-20 MED ORDER — HYDROCODONE-ACETAMINOPHEN 10-325 MG PO TABS: 1.0000 | ORAL_TABLET | ORAL | 0 refills | Status: DC

## 2017-03-20 MED ORDER — SENNA 8.6 MG PO TABS: 1.0000 | ORAL_TABLET | Freq: Two times a day (BID) | ORAL | 0 refills | Status: DC

## 2017-03-20 MED ORDER — METHOCARBAMOL 500 MG PO TABS: 500.0000 mg | ORAL_TABLET | Freq: Four times a day (QID) | ORAL | 1 refills | Status: DC | PRN

## 2017-03-20 NOTE — Care Management (Signed)
RW has been delivered to room. Pt discharging home today. Knows to call DME company on her way home and they will meet her there. Tim Justis, of [facility], aware of DC today.

## 2017-03-20 NOTE — Discharge Summary (Signed)
Physician Discharge Summary  Patient ID: Kelli Spencer MRN: [id number] DOB/AGE: [birth year] 51 y.o.  Admit date: 03/18/2017 Discharge date: 03/20/2017  Admission Diagnoses: primary osteoarthritis right knee  Discharge Diagnoses: same Active Problems:   Primary osteoarthritis of right knee   Discharged Condition: good  Hospital Course:   5/16  Day 1 RT TKA, DEPUY FB SIGMA//23F 2.5T 10 POLY 32 P// SPINAL, UNCOMPLICATED   875, 18  DAY 2 AND 3 ADVANCED IN PT   CBC Latest Ref Rng & Units 03/20/2017 03/19/2017 03/13/2017  WBC 4.0 - 10.5 K/uL 9.1 10.3 8.2  Hemoglobin 12.0 - 15.0 g/dL 10.3(L) 10.3(L) 12.5  Hematocrit 36.0 - 46.0 % 32.0(L) 32.4(L) 37.9  Platelets 150 - 400 K/uL 267 275 369   BMP Latest Ref Rng & Units 03/19/2017 03/13/2017 12/04/2016  Glucose 65 - 99 mg/dL 189(H) 125(H) 108(H)  BUN 6 - 20 mg/dL 15 22(H) 24  Creatinine 0.44 - 1.00 mg/dL 1.15(H) 1.33(H) 1.32(H)  Sodium 135 - 145 mmol/L 136 141 140  Potassium 3.5 - 5.1 mmol/L 4.4 4.0 4.6  Chloride 101 - 111 mmol/L 103 105 104  CO2 22 - 32 mmol/L 26 26 24   Calcium 8.9 - 10.3 mg/dL 8.7(L) 9.8 9.7   PT REPORT # OF FEET WALKED: 173ft with RW and Mod (I), 4 steps with supervision ROM:  Flexion: 60* - limited due to pain and guarding            Extension: 5*   Discharge Exam: Blood pressure 119/71, pulse 85, temperature 98.5 F (36.9 C), temperature source Oral, resp. rate 18, height 5' (1.524 m), weight 198 lb (89.8 kg), last menstrual period 06/10/1988, SpO2 96 %.  Today the patient is awake alert and oriented to person place and time and appropriate  Her skin incision is clean dry and intact with no drainage.  The drain site is dry  The patient has a soft calf no swelling normal Homans sign   Disposition: 06-Home-Health Care Svc  Discharge Instructions    CPM    Complete by:  As directed    cpm x 2 weeks  Use 6 hrs per day   Start at 60 increase 10 every day   Call MD / Call 911    Complete by:  As  directed    If you experience chest pain or shortness of breath, CALL 911 and be transported to the hospital emergency room.  If you develope a fever above 101 F, pus (white drainage) or increased drainage or redness at the wound, or calf pain, call your surgeon's office.   Change dressing    Complete by:  As directed    You do not have to change the dressing   Constipation Prevention    Complete by:  As directed    Drink plenty of fluids.  Prune juice may be helpful.  You may use a stool softener, such as Colace (over the counter) 100 mg twice a day.  Use MiraLax (over the counter) for constipation as needed.   Diet - low sodium heart healthy    Complete by:  As directed    Do not put a pillow under the knee. Place it under the heel.    Complete by:  As directed    Driving restrictions    Complete by:  As directed    No driving for 4 weeks   Increase activity slowly as tolerated    Complete by:  As directed    TED hose  Complete by:  As directed    Use stockings (TED hose) for 2 weeks on both leg(s).  You may remove them at night for sleeping.     Allergies as of 03/20/2017      Reactions   Prednisone Swelling, Other (See Comments)   Made mouth and throat feel numb   Ace Inhibitors Cough   Metformin And Related Diarrhea      Medication List    STOP taking these medications   acetaminophen-codeine 300-30 MG tablet Commonly known as:  TYLENOL #3   traMADol-acetaminophen 37.5-325 MG tablet Commonly known as:  ULTRACET     TAKE these medications   albuterol 108 (90 Base) MCG/ACT inhaler Commonly known as:  PROVENTIL HFA;VENTOLIN HFA Inhale 2 puffs into the lungs every 6 (six) hours as needed for wheezing or shortness of breath.   amLODipine 2.5 MG tablet Commonly known as:  NORVASC TAKE 1 TABLET BY MOUTH EVERY DAY   aspirin 325 MG EC tablet Take 1 tablet (325 mg total) by mouth daily with breakfast.   cholecalciferol 400 units Tabs tablet Commonly known as:   VITAMIN D Take 800 Units by mouth daily.   glipiZIDE 10 MG tablet Commonly known as:  GLUCOTROL One tablet once daily   glucose blood test strip Commonly known as:  ONETOUCH VERIO Once daily testing   HYDROcodone-acetaminophen 10-325 MG tablet Commonly known as:  NORCO Take 1 tablet by mouth every 4 (four) hours.   hydrOXYzine 50 MG tablet Commonly known as:  ATARAX/VISTARIL TAKE ONE TABLET BY MOUTH AT BEDTIME FOR ITCHING AND ANXIETY What changed:  how much to take  how to take this  when to take this  reasons to take this  additional instructions   JANUVIA 100 MG tablet Generic drug:  sitaGLIPtin Take 100 mg by mouth daily.   methocarbamol 500 MG tablet Commonly known as:  ROBAXIN Take 1 tablet (500 mg total) by mouth every 6 (six) hours as needed for muscle spasms.   omeprazole 40 MG capsule Commonly known as:  PRILOSEC Take 1 capsule (40 mg total) by mouth 2 (two) times daily.   ONETOUCH DELICA LANCETS 40J Misc TO USE WITH ONE TOUCH VERIO FLEX, TEST ONCE A DAY   pravastatin 10 MG tablet Commonly known as:  PRAVACHOL TAKE 1 TABLET(10 MG) BY MOUTH DAILY   senna 8.6 MG Tabs tablet Commonly known as:  SENOKOT Take 1 tablet (8.6 mg total) by mouth 2 (two) times daily.   TRADJENTA 5 MG Tabs tablet Generic drug:  linagliptin TAKE 1 TABLET(5 MG) BY MOUTH DAILY            Durable Medical Equipment        Start     Ordered   03/20/17 0744  For home use only DME Walker  Once    Question:  Patient needs a walker to treat with the following condition  Answer:  History of knee replacement   03/20/17 0743   03/20/17 0744  For home use only DME 3 n 1  Once     03/20/17 0743   03/20/17 0744  For home use only DME Bedside commode  Once    Question:  Patient needs a bedside commode to treat with the following condition  Answer:  History of knee replacement   03/20/17 0743   03/20/17 0744  For home use only DME Continuous passive motion machine  Once      03/20/17 0743   03/19/17 0752  For home  use only DME Walker rolling  Once    Question:  Patient needs a walker to treat with the following condition  Answer:  Total knee replacement status, unspecified laterality   03/19/17 0751   03/19/17 0752  For home use only DME 3 n 1  Once     03/19/17 0751     Follow-up Information    Home[facility] up.   Specialty:  [facility] Contact information: [place] [address] [phone]           Signed: Arther Abbott 03/20/2017, 8:05 AM

## 2017-03-20 NOTE — Progress Notes (Signed)
Patient's IV removed.  Site WNL.  AVS reviewed with patient.  Verbalized understanding of discharge instructions, physician follow-up, medications.  Patient stable and waiting family for transport home.

## 2017-03-20 NOTE — Care Management Important Message (Signed)
Important Message  Patient Details  Name: Kelli Spencer MRN: [id number] Date of Birth: Dec 26, 1965   Medicare Important Message Given:  Yes    Sherald Barge, RN 03/20/2017, 9:11 AM

## 2017-03-21 DIAGNOSIS — N189 Chronic kidney disease, unspecified: Secondary | ICD-10-CM | POA: Diagnosis not present

## 2017-03-21 DIAGNOSIS — Z7984 Long term (current) use of oral hypoglycemic drugs: Secondary | ICD-10-CM | POA: Diagnosis not present

## 2017-03-21 DIAGNOSIS — E1122 Type 2 diabetes mellitus with diabetic chronic kidney disease: Secondary | ICD-10-CM | POA: Diagnosis not present

## 2017-03-21 DIAGNOSIS — Z96651 Presence of right artificial knee joint: Secondary | ICD-10-CM | POA: Diagnosis not present

## 2017-03-21 DIAGNOSIS — Z471 Aftercare following joint replacement surgery: Secondary | ICD-10-CM | POA: Diagnosis not present

## 2017-03-21 DIAGNOSIS — I129 Hypertensive chronic kidney disease with stage 1 through stage 4 chronic kidney disease, or unspecified chronic kidney disease: Secondary | ICD-10-CM | POA: Diagnosis not present

## 2017-03-21 DIAGNOSIS — Z79899 Other long term (current) drug therapy: Secondary | ICD-10-CM | POA: Diagnosis not present

## 2017-03-21 DIAGNOSIS — M1712 Unilateral primary osteoarthritis, left knee: Secondary | ICD-10-CM | POA: Diagnosis not present

## 2017-03-21 DIAGNOSIS — Z7982 Long term (current) use of aspirin: Secondary | ICD-10-CM | POA: Diagnosis not present

## 2017-03-23 ENCOUNTER — Other Ambulatory Visit: Payer: Self-pay | Admitting: *Deleted

## 2017-03-23 ENCOUNTER — Telehealth: Payer: Self-pay | Admitting: Orthopedic Surgery

## 2017-03-23 LAB — TYPE AND SCREEN
ABO/RH(D): B POS
Antibody Screen: NEGATIVE
UNIT DIVISION: 0
UNIT DIVISION: 0

## 2017-03-23 LAB — BPAM RBC
BLOOD PRODUCT EXPIRATION DATE: 201806112359
Blood Product Expiration Date: 201806072359
UNIT TYPE AND RH: 5100
Unit Type and Rh: 5100

## 2017-03-23 MED ORDER — TIZANIDINE HCL 2 MG PO TABS
2.0000 mg | ORAL_TABLET | Freq: Four times a day (QID) | ORAL | 0 refills | Status: DC | PRN
Start: 2017-03-23 — End: 2017-09-29

## 2017-03-23 NOTE — Telephone Encounter (Signed)
Call received this morning from physical therapist from [facility], direct ph# [phone], requests verbal authorization for home therapy 3 times a week for 2 weeks; states she had seen patient for the initial evaluation already.  Please advise.

## 2017-03-23 NOTE — Telephone Encounter (Signed)
RETURNED CALL, Kelli Spencer

## 2017-03-24 ENCOUNTER — Other Ambulatory Visit: Payer: Self-pay | Admitting: Family Medicine

## 2017-03-24 ENCOUNTER — Telehealth: Payer: Self-pay | Admitting: Orthopedic Surgery

## 2017-03-24 DIAGNOSIS — M1712 Unilateral primary osteoarthritis, left knee: Secondary | ICD-10-CM | POA: Diagnosis not present

## 2017-03-24 DIAGNOSIS — N189 Chronic kidney disease, unspecified: Secondary | ICD-10-CM | POA: Diagnosis not present

## 2017-03-24 DIAGNOSIS — Z79899 Other long term (current) drug therapy: Secondary | ICD-10-CM | POA: Diagnosis not present

## 2017-03-24 DIAGNOSIS — Z96651 Presence of right artificial knee joint: Secondary | ICD-10-CM | POA: Diagnosis not present

## 2017-03-24 DIAGNOSIS — Z7984 Long term (current) use of oral hypoglycemic drugs: Secondary | ICD-10-CM | POA: Diagnosis not present

## 2017-03-24 DIAGNOSIS — Z7982 Long term (current) use of aspirin: Secondary | ICD-10-CM | POA: Diagnosis not present

## 2017-03-24 DIAGNOSIS — E1122 Type 2 diabetes mellitus with diabetic chronic kidney disease: Secondary | ICD-10-CM | POA: Diagnosis not present

## 2017-03-24 DIAGNOSIS — I129 Hypertensive chronic kidney disease with stage 1 through stage 4 chronic kidney disease, or unspecified chronic kidney disease: Secondary | ICD-10-CM | POA: Diagnosis not present

## 2017-03-24 DIAGNOSIS — Z471 Aftercare following joint replacement surgery: Secondary | ICD-10-CM | POA: Diagnosis not present

## 2017-03-24 NOTE — Telephone Encounter (Signed)
RETURNED CALL, Bradley

## 2017-03-24 NOTE — Telephone Encounter (Signed)
Flor from [facility] called wanting to speak with you.  Please call her at [phone]  Thanks

## 2017-03-24 NOTE — Telephone Encounter (Signed)
APPROVED THERAPY GAVE VERBAL ORDER

## 2017-03-25 ENCOUNTER — Telehealth: Payer: Self-pay | Admitting: Orthopedic Surgery

## 2017-03-25 ENCOUNTER — Other Ambulatory Visit: Payer: Self-pay | Admitting: Orthopedic Surgery

## 2017-03-25 ENCOUNTER — Telehealth: Payer: Self-pay | Admitting: Family Medicine

## 2017-03-25 MED ORDER — HYDROCODONE-ACETAMINOPHEN 7.5-325 MG PO TABS: 1.0000 | ORAL_TABLET | ORAL | 0 refills | Status: DC | PRN

## 2017-03-25 NOTE — Telephone Encounter (Signed)
Hydrocodone-Acetaminophen 10/325 mg  Qty 30 Tablets ° °Take 1 tablet by mouth every 4 (four) hours. ° ° °

## 2017-03-25 NOTE — Telephone Encounter (Signed)
Routing to Dr Harrison for approval 

## 2017-03-25 NOTE — Telephone Encounter (Signed)
The message has been routed to dr Aline Brochure for approval

## 2017-03-25 NOTE — Telephone Encounter (Signed)
Patient left a msg on the voicemail requesting a refill for pain medication , states she had a knee replacement and is in a lot of pain. While pulling up her chart I noticed she also contacted Dr.Harrison's office for the same situation.   Cb#: [phone]

## 2017-03-26 ENCOUNTER — Telehealth: Payer: Self-pay | Admitting: Orthopedic Surgery

## 2017-03-26 DIAGNOSIS — Z471 Aftercare following joint replacement surgery: Secondary | ICD-10-CM | POA: Diagnosis not present

## 2017-03-26 DIAGNOSIS — I129 Hypertensive chronic kidney disease with stage 1 through stage 4 chronic kidney disease, or unspecified chronic kidney disease: Secondary | ICD-10-CM | POA: Diagnosis not present

## 2017-03-26 DIAGNOSIS — Z79899 Other long term (current) drug therapy: Secondary | ICD-10-CM | POA: Diagnosis not present

## 2017-03-26 DIAGNOSIS — E1122 Type 2 diabetes mellitus with diabetic chronic kidney disease: Secondary | ICD-10-CM | POA: Diagnosis not present

## 2017-03-26 DIAGNOSIS — M1712 Unilateral primary osteoarthritis, left knee: Secondary | ICD-10-CM | POA: Diagnosis not present

## 2017-03-26 DIAGNOSIS — Z96651 Presence of right artificial knee joint: Secondary | ICD-10-CM | POA: Diagnosis not present

## 2017-03-26 DIAGNOSIS — Z7984 Long term (current) use of oral hypoglycemic drugs: Secondary | ICD-10-CM | POA: Diagnosis not present

## 2017-03-26 DIAGNOSIS — N189 Chronic kidney disease, unspecified: Secondary | ICD-10-CM | POA: Diagnosis not present

## 2017-03-26 DIAGNOSIS — Z7982 Long term (current) use of aspirin: Secondary | ICD-10-CM | POA: Diagnosis not present

## 2017-03-26 NOTE — Telephone Encounter (Signed)
Patient is asking if Dr. Aline Brochure would do a script for Naproxen. She states that her knee is swollen some and thought that Naproxen would help with the swelling.  Please advise

## 2017-03-26 NOTE — Telephone Encounter (Signed)
Routing to Dr Harrison for approval 

## 2017-03-27 ENCOUNTER — Other Ambulatory Visit: Payer: Self-pay | Admitting: *Deleted

## 2017-03-27 DIAGNOSIS — N189 Chronic kidney disease, unspecified: Secondary | ICD-10-CM | POA: Diagnosis not present

## 2017-03-27 DIAGNOSIS — E1122 Type 2 diabetes mellitus with diabetic chronic kidney disease: Secondary | ICD-10-CM | POA: Diagnosis not present

## 2017-03-27 DIAGNOSIS — Z79899 Other long term (current) drug therapy: Secondary | ICD-10-CM | POA: Diagnosis not present

## 2017-03-27 DIAGNOSIS — Z7984 Long term (current) use of oral hypoglycemic drugs: Secondary | ICD-10-CM | POA: Diagnosis not present

## 2017-03-27 DIAGNOSIS — M1712 Unilateral primary osteoarthritis, left knee: Secondary | ICD-10-CM | POA: Diagnosis not present

## 2017-03-27 DIAGNOSIS — Z471 Aftercare following joint replacement surgery: Secondary | ICD-10-CM | POA: Diagnosis not present

## 2017-03-27 DIAGNOSIS — Z7982 Long term (current) use of aspirin: Secondary | ICD-10-CM | POA: Diagnosis not present

## 2017-03-27 DIAGNOSIS — Z96651 Presence of right artificial knee joint: Secondary | ICD-10-CM | POA: Diagnosis not present

## 2017-03-27 DIAGNOSIS — I129 Hypertensive chronic kidney disease with stage 1 through stage 4 chronic kidney disease, or unspecified chronic kidney disease: Secondary | ICD-10-CM | POA: Diagnosis not present

## 2017-03-27 MED ORDER — IBUPROFEN 800 MG PO TABS: 800.0000 mg | ORAL_TABLET | Freq: Three times a day (TID) | ORAL | 0 refills | Status: DC | PRN

## 2017-03-27 NOTE — Telephone Encounter (Signed)
MEDICATION SENT TO PHARMACY, PATIENT AWARE

## 2017-03-27 NOTE — Telephone Encounter (Signed)
Prefer ibuprofen 800 mg tid

## 2017-03-31 ENCOUNTER — Encounter: Payer: Self-pay | Admitting: Orthopedic Surgery

## 2017-03-31 ENCOUNTER — Ambulatory Visit (INDEPENDENT_AMBULATORY_CARE_PROVIDER_SITE_OTHER): Payer: Self-pay | Admitting: Orthopedic Surgery

## 2017-03-31 DIAGNOSIS — Z471 Aftercare following joint replacement surgery: Secondary | ICD-10-CM | POA: Diagnosis not present

## 2017-03-31 DIAGNOSIS — Z96652 Presence of left artificial knee joint: Secondary | ICD-10-CM

## 2017-03-31 DIAGNOSIS — I129 Hypertensive chronic kidney disease with stage 1 through stage 4 chronic kidney disease, or unspecified chronic kidney disease: Secondary | ICD-10-CM | POA: Diagnosis not present

## 2017-03-31 DIAGNOSIS — Z96651 Presence of right artificial knee joint: Secondary | ICD-10-CM

## 2017-03-31 DIAGNOSIS — E1122 Type 2 diabetes mellitus with diabetic chronic kidney disease: Secondary | ICD-10-CM | POA: Diagnosis not present

## 2017-03-31 DIAGNOSIS — Z7984 Long term (current) use of oral hypoglycemic drugs: Secondary | ICD-10-CM | POA: Diagnosis not present

## 2017-03-31 DIAGNOSIS — Z7982 Long term (current) use of aspirin: Secondary | ICD-10-CM | POA: Diagnosis not present

## 2017-03-31 DIAGNOSIS — N189 Chronic kidney disease, unspecified: Secondary | ICD-10-CM | POA: Diagnosis not present

## 2017-03-31 DIAGNOSIS — Z79899 Other long term (current) drug therapy: Secondary | ICD-10-CM | POA: Diagnosis not present

## 2017-03-31 DIAGNOSIS — M1712 Unilateral primary osteoarthritis, left knee: Secondary | ICD-10-CM | POA: Diagnosis not present

## 2017-03-31 DIAGNOSIS — Z4889 Encounter for other specified surgical aftercare: Secondary | ICD-10-CM

## 2017-03-31 MED ORDER — HYDROCODONE-ACETAMINOPHEN 7.5-325 MG PO TABS: 1.0000 | ORAL_TABLET | ORAL | 0 refills | Status: DC | PRN

## 2017-03-31 NOTE — Progress Notes (Signed)
Patient ID: Kelli Spencer, female   DOB: Oct 24, 1966, 51 y.o.   MRN: [id number]  Chief Complaint  Patient presents with  . Follow-up    post op 1, RT TKA, DOS 03/18/17   POD 13   HPI Kelli Spencer is a 51 y.o. female.  HAD TKA DOING WELL    Allergies  Allergen Reactions  . Prednisone Swelling and Other (See Comments)    Made mouth and throat feel numb  . Ace Inhibitors Cough  . Metformin And Related Diarrhea    Current Outpatient Prescriptions  Medication Sig Dispense Refill  . albuterol (PROVENTIL HFA;VENTOLIN HFA) 108 (90 Base) MCG/ACT inhaler Inhale 2 puffs into the lungs every 6 (six) hours as needed for wheezing or shortness of breath. 3 Inhaler 1  . amLODipine (NORVASC) 2.5 MG tablet TAKE 1 TABLET BY MOUTH EVERY DAY 90 tablet 1  . aspirin EC 325 MG EC tablet Take 1 tablet (325 mg total) by mouth daily with breakfast. 30 tablet 0  . cholecalciferol (VITAMIN D) 400 UNITS TABS tablet Take 800 Units by mouth daily.    Marland Kitchen glipiZIDE (GLUCOTROL) 10 MG tablet One tablet once daily 90 tablet 1  . glucose blood (ONETOUCH VERIO) test strip Once daily testing 100 each 1  . HYDROcodone-acetaminophen (NORCO) 7.5-325 MG tablet Take 1 tablet by mouth every 4 (four) hours as needed for moderate pain. 42 tablet 0  . hydrOXYzine (ATARAX/VISTARIL) 50 MG tablet TAKE ONE TABLET BY MOUTH AT BEDTIME FOR ITCHING AND ANXIETY (Patient taking differently: Take 50 mg by mouth at bedtime as needed for anxiety or itching. ) 30 tablet 0  . ibuprofen (ADVIL,MOTRIN) 800 MG tablet Take 1 tablet (800 mg total) by mouth every 8 (eight) hours as needed. 90 tablet 0  . omeprazole (PRILOSEC) 40 MG capsule Take 1 capsule (40 mg total) by mouth 2 (two) times daily. 180 capsule 1  . ONETOUCH DELICA LANCETS 51Z MISC TO USE WITH ONE TOUCH VERIO FLEX, TEST ONCE A DAY 100 each 3  . pravastatin (PRAVACHOL) 10 MG tablet TAKE 1 TABLET(10 MG) BY MOUTH DAILY 90 tablet 1  . senna (SENOKOT) 8.6 MG TABS tablet Take 1 tablet (8.6  mg total) by mouth 2 (two) times daily. 120 each 0  . tiZANidine (ZANAFLEX) 2 MG tablet Take 1 tablet (2 mg total) by mouth every 6 (six) hours as needed for muscle spasms. 30 tablet 0  . TRADJENTA 5 MG TABS tablet TAKE 1 TABLET(5 MG) BY MOUTH DAILY 90 tablet 1   No current facility-administered medications for this visit.       Physical Exam Physical Exam Last menstrual period 06/10/1988.  Appearance of incision: Incision is clean dry and intact we removed the staples  The calf was supple and the Homans sign was normal, there is minimal peripheral edema  Assessment and plan The patient is doing well and is in good condition  Follow-up will be 4 WEEKS  Encounter Diagnoses  Name Primary?  Marland Kitchen Aftercare following surgery Yes  . Status post total left knee replacement    Meds ordered this encounter  Medications  . HYDROcodone-acetaminophen (NORCO) 7.5-325 MG tablet    Sig: Take 1 tablet by mouth every 4 (four) hours as needed for moderate pain.    Dispense:  42 tablet    Refill:  0     9:31 AM Arther Abbott, MD 03/31/2017

## 2017-03-31 NOTE — Addendum Note (Signed)
Addended by: Baldomero Lamy B on: 03/31/2017 11:17 AM   Modules accepted: Orders

## 2017-04-01 DIAGNOSIS — Z79899 Other long term (current) drug therapy: Secondary | ICD-10-CM | POA: Diagnosis not present

## 2017-04-01 DIAGNOSIS — E1122 Type 2 diabetes mellitus with diabetic chronic kidney disease: Secondary | ICD-10-CM | POA: Diagnosis not present

## 2017-04-01 DIAGNOSIS — I129 Hypertensive chronic kidney disease with stage 1 through stage 4 chronic kidney disease, or unspecified chronic kidney disease: Secondary | ICD-10-CM | POA: Diagnosis not present

## 2017-04-01 DIAGNOSIS — M1712 Unilateral primary osteoarthritis, left knee: Secondary | ICD-10-CM | POA: Diagnosis not present

## 2017-04-01 DIAGNOSIS — Z7982 Long term (current) use of aspirin: Secondary | ICD-10-CM | POA: Diagnosis not present

## 2017-04-01 DIAGNOSIS — Z7984 Long term (current) use of oral hypoglycemic drugs: Secondary | ICD-10-CM | POA: Diagnosis not present

## 2017-04-01 DIAGNOSIS — N189 Chronic kidney disease, unspecified: Secondary | ICD-10-CM | POA: Diagnosis not present

## 2017-04-01 DIAGNOSIS — Z471 Aftercare following joint replacement surgery: Secondary | ICD-10-CM | POA: Diagnosis not present

## 2017-04-01 DIAGNOSIS — Z96651 Presence of right artificial knee joint: Secondary | ICD-10-CM | POA: Diagnosis not present

## 2017-04-03 DIAGNOSIS — N189 Chronic kidney disease, unspecified: Secondary | ICD-10-CM | POA: Diagnosis not present

## 2017-04-03 DIAGNOSIS — Z79899 Other long term (current) drug therapy: Secondary | ICD-10-CM | POA: Diagnosis not present

## 2017-04-03 DIAGNOSIS — M1712 Unilateral primary osteoarthritis, left knee: Secondary | ICD-10-CM | POA: Diagnosis not present

## 2017-04-03 DIAGNOSIS — Z96651 Presence of right artificial knee joint: Secondary | ICD-10-CM | POA: Diagnosis not present

## 2017-04-03 DIAGNOSIS — I129 Hypertensive chronic kidney disease with stage 1 through stage 4 chronic kidney disease, or unspecified chronic kidney disease: Secondary | ICD-10-CM | POA: Diagnosis not present

## 2017-04-03 DIAGNOSIS — Z471 Aftercare following joint replacement surgery: Secondary | ICD-10-CM | POA: Diagnosis not present

## 2017-04-03 DIAGNOSIS — Z7984 Long term (current) use of oral hypoglycemic drugs: Secondary | ICD-10-CM | POA: Diagnosis not present

## 2017-04-03 DIAGNOSIS — Z7982 Long term (current) use of aspirin: Secondary | ICD-10-CM | POA: Diagnosis not present

## 2017-04-03 DIAGNOSIS — E1122 Type 2 diabetes mellitus with diabetic chronic kidney disease: Secondary | ICD-10-CM | POA: Diagnosis not present

## 2017-04-07 ENCOUNTER — Ambulatory Visit (HOSPITAL_COMMUNITY): Payer: Medicare Other | Attending: Orthopedic Surgery | Admitting: Physical Therapy

## 2017-04-07 ENCOUNTER — Telehealth: Payer: Self-pay | Admitting: Orthopedic Surgery

## 2017-04-07 ENCOUNTER — Other Ambulatory Visit: Payer: Self-pay | Admitting: Orthopedic Surgery

## 2017-04-07 DIAGNOSIS — R600 Localized edema: Secondary | ICD-10-CM | POA: Diagnosis not present

## 2017-04-07 DIAGNOSIS — M25562 Pain in left knee: Secondary | ICD-10-CM | POA: Insufficient documentation

## 2017-04-07 DIAGNOSIS — R2689 Other abnormalities of gait and mobility: Secondary | ICD-10-CM | POA: Diagnosis not present

## 2017-04-07 DIAGNOSIS — G8929 Other chronic pain: Secondary | ICD-10-CM | POA: Diagnosis not present

## 2017-04-07 DIAGNOSIS — M25561 Pain in right knee: Secondary | ICD-10-CM | POA: Insufficient documentation

## 2017-04-07 DIAGNOSIS — M25661 Stiffness of right knee, not elsewhere classified: Secondary | ICD-10-CM | POA: Insufficient documentation

## 2017-04-07 MED ORDER — HYDROCODONE-ACETAMINOPHEN 7.5-325 MG PO TABS: 1.0000 | ORAL_TABLET | ORAL | 0 refills | Status: DC | PRN

## 2017-04-07 NOTE — Telephone Encounter (Signed)
Hydrocodone- Acetaminophen  7.5/325 mg  Qty 42 Tablets  Take 1 tablet by mouth every 4 (four) hours as needed for moderate pain.  Pt in waiting room waiting.

## 2017-04-07 NOTE — Telephone Encounter (Signed)
Routing to Dr Harrison for approval 

## 2017-04-07 NOTE — Therapy (Signed)
[place], [place], [address] Phone: [phone]   Fax:  [phone]  Physical Therapy Evaluation  Patient Details  Name: Kelli Spencer MRN: [id number] Date of Birth: 12-01-1965 Referring Provider: Arther Abbott   Encounter Date: 04/07/2017      PT End of Session - 04/07/17 1037    Visit Number 1   Number of Visits 12   Date for PT Re-Evaluation 05/07/17   Authorization Type [facility] medicare/ medicaid   Authorization - Visit Number 1   Authorization - Number of Visits 10   PT Start Time 0945   PT Stop Time 1030   PT Time Calculation (min) 45 min   Activity Tolerance Patient tolerated treatment well   Behavior During Therapy [facility] for tasks assessed/performed      Past Medical History:  Diagnosis Date  . Arthritis   . Asthma   . Diabetes mellitus without complication ([place])   . Diastolic hypertension   . GERD (gastroesophageal reflux disease)   . Hyperlipidemia   . Hypertension   . Migraines   . Obesity   . Seizures ([place])    pt states she has seizures only when she gets upset. pt is taking no seizure meds    Past Surgical History:  Procedure Laterality Date  . ABDOMINAL HYSTERECTOMY  1997   fibroids  . COLONOSCOPY N/A 06/23/2016   Procedure: COLONOSCOPY;  Surgeon: Danie Binder, MD;  Location: [facility] ENDO SUITE;  Service: Endoscopy;  Laterality: N/A;  11:30 AM  . KNEE ARTHROSCOPY WITH MEDIAL MENISECTOMY Left 02/09/2014   Procedure: KNEE ARTHROSCOPY WITH MEDIAL MENISECTOMY;  Surgeon: Carole Civil, MD;  Location: [facility] ORS;  Service: Orthopedics;  Laterality: Left;  . PARTIAL HYSTERECTOMY    . TOTAL KNEE ARTHROPLASTY Right 03/18/2017   Procedure: TOTAL KNEE ARTHROPLASTY;  Surgeon: Carole Civil, MD;  Location: [facility] ORS;  Service: Orthopedics;  Laterality: Right;    There were no vitals filed for this visit.       Subjective Assessment - 04/07/17 0946    Subjective Kelli Spencer states that she had  her Rt knee replaced on 03/18/2017.  She has been recieving home health therapy and is now being referred for outpatient therapy.  Kelli Spencer is ambuating with a walker at this time.    She wants to be able to go up and down her steps easier.      Pertinent History HTN, DM, back pain    How long can you sit comfortably? Able to sit for 10 minutes   How long can you stand comfortably? Standing for 15 minutes with upper extremity support    How long can you walk comfortably? Walking with her walker in the house only less than five minutes.    Patient Stated Goals walk without an assistive device;  walk longer, stand longer, to do steps    Currently in Pain? Yes   Pain Score 9    Pain Location Knee   Pain Orientation Right   Pain Descriptors / Indicators Aching;Throbbing;Tender   Pain Type Acute pain   Pain Onset 1 to 4 weeks ago   Aggravating Factors  activity   Pain Relieving Factors icing; pain medication    Effect of Pain on Daily Activities increases             [facility] PT Assessment - 04/07/17 0001      Assessment   Medical Diagnosis Rt TKR   Referring Provider Dorothyann Peng  Harrison    Onset Date/Surgical Date 03/18/17   Next MD Visit 05/07/2017   Prior Therapy acute     Precautions   Precautions None     Restrictions   Weight Bearing Restrictions No     Balance Screen   Has the patient fallen in the past 6 months No   Has the patient had a decrease in activity level because of a fear of falling?  Yes   Is the patient reluctant to leave their home because of a fear of falling?  No     Home Ecologist residence     Prior Function   Level of Independence Independent   Vocation Unemployed   Leisure cooking      Cognition   Overall Cognitive Status Within Functional Limits for tasks assessed     Observation/Other Assessments   Focus on Therapeutic Outcomes (FOTO)  38     Observation/Other Assessments-Edema    Edema Circumferential      Circumferential Edema   Circumferential - Right 49   Circumferential - Left  43     Functional Tests   Functional tests Single leg stance;Sit to Stand     Single Leg Stance   Comments Rt 20; Lt 38     Sit to Stand   Comments 5 x in 11.65      ROM / Strength   AROM / PROM / Strength AROM;Strength     AROM   AROM Assessment Site Knee   Right/Left Knee Right   Right Knee Extension 8   Right Knee Flexion 80     Strength   Strength Assessment Site Hip;Knee;Ankle  LT LT is functional    Right/Left Hip Right   Right Hip Flexion 5/5   Right Hip Extension 5/5   Right Hip ABduction 4+/5   Right/Left Knee Right   Right Knee Flexion 5/5   Right Knee Extension 3/5   Right/Left Ankle Right   Right Ankle Dorsiflexion 5/5     Ambulation/Gait   Ambulation Distance (Feet) 276 Feet   Assistive device Straight cane   Gait Pattern Step-through pattern   Gait Comments 3 minute walk test             Objective measurements completed on examination: See above findings.          [facility] Adult PT Treatment/Exercise - 04/07/17 0001      Exercises   Exercises Knee/Hip     Knee/Hip Exercises: Stretches   Active Hamstring Stretch Right;2 reps;30 seconds     Knee/Hip Exercises: Standing   Heel Raises Both;10 reps   Functional Squat 10 reps     Knee/Hip Exercises: Supine   Quad Sets 10 reps   Heel Slides Right;10 reps     Manual Therapy   Manual Therapy Edema management;Soft tissue mobilization;Manual Lymphatic Drainage (MLD)   Manual Lymphatic Drainage (MLD) to decrease swelling in Rt LE                 PT Education - 04/07/17 1035    Education provided Yes   Education Details Gait trained with cane; ice with LE above heart, keep up Home health exercises.    Person(s) Educated Patient   Methods Explanation   Comprehension Verbalized understanding          PT Short Term Goals - 04/07/17 1049      PT SHORT TERM GOAL #1   Title PT pain in her right knee  to be no greater than a 5/10 to allow pt to be able to walk with least assistive device x 20 minutes to be able to complete short shopping tasks.    Time 2   Period Weeks   Status New     PT SHORT TERM GOAL #2   Title PT right knee ROM to be to 95 degrees to allow pt to sit in comfort for 30 minutes to enjoy a meal.    Time 2   Period Weeks   Status New     PT SHORT TERM GOAL #3   Title Pt to be able to stand for 30 minutes in order to make a simple meal    Time 2   Period Weeks   Status New           PT Long Term Goals - 04/07/17 1051      PT LONG TERM GOAL #1   Title Pt pain to be no greater than a 2/10 to allow pt to walk without an assistive device for up to 40 minutes for longer shopping trips    Time 4   Period Weeks   Status New     PT LONG TERM GOAL #2   Title Pt right knee ROM to be to 110 degrees to allow pt to bend down and pick items off the floor with ease.    Time 4   Period Weeks   Status New     PT LONG TERM GOAL #3   Title Pt to be able to ascend and descend four steps in a reciprocal manner with a handrail without difficulty.    Time 4   Period Weeks   Status New     PT LONG TERM GOAL #4   Title PT single leg stance to be at least 30 seconds on both LE to allow pt to feel comfortable and confident walking over uneven terrain.   Time 4   Period Weeks   Status Unable to assess                Plan - 04/07/17 1038    Clinical Impression Statement Kelli Spencer is a 51 yo femele who opted to have a TKR on her right knee on 03/18/2017 she was released from home health therapy on6/11/2016 and is currently being referred to skilled out patient physical therapy to maximize her functional level.  At this time Kelli Spencer has decreased ROM, decreased activity tolerance, decreased balance,increased pain and increased edema.   She will benefit from skilled physical therapy to maximize her functional level.     History and Personal Factors relevant to plan of  care: DM, HTN, OA in left knee    Clinical Presentation Stable   Clinical Decision Making Low   Rehab Potential Good   PT Frequency 3x / week   PT Duration 4 weeks   PT Treatment/Interventions ADLs/Self Care Home Management;Cryotherapy;Patient/family education;Manual techniques;Manual lymph drainage;Passive range of motion;Gait training;Stair training;Balance training;Electrical Stimulation   PT Next Visit Plan Pt treatment will focus on gait, decreasing pain, decreasing edema and increasing ROM.  Next treatment begin with manual to decrease swelling followed by prone knee flexion, prone quadricep stretch, gait training with no assistive device.  Progress to knee drivers, stool scoots, PROM, stair training.  May use ice and e-stim if swelling does not decrease.    PT Home Exercise Plan to continue home health exercises.   Consulted and Agree with Plan of Care Patient  Patient will benefit from skilled therapeutic intervention in order to improve the following deficits and impairments:  Decreased activity tolerance, Decreased balance, Decreased range of motion, Difficulty walking, Increased edema, Pain, Increased fascial restricitons  Visit Diagnosis: Stiffness of right knee, not elsewhere classified - Plan: PT plan of care cert/re-cert  Localized edema - Plan: PT plan of care cert/re-cert  Chronic pain of left knee - Plan: PT plan of care cert/re-cert  Other abnormalities of gait and mobility - Plan: PT plan of care cert/re-cert      G-Codes - 32/99/24 1056    Functional Assessment Tool Used (Outpatient Only) foto   Functional Limitation Mobility: Walking and moving around   Mobility: Walking and Moving Around Current Status [phone]) At least 60 percent but less than 80 percent impaired, limited or restricted   Mobility: Walking and Moving Around Goal Status [phone]) At least 20 percent but less than 40 percent impaired, limited or restricted       Problem List Patient Active  Problem List   Diagnosis Date Noted  . Primary osteoarthritis of right knee 03/18/2017  . Dyslipidemia, goal LDL below 100 05/05/2016  . CKD (chronic kidney disease) 02/01/2016  . Vitamin D deficiency 01/16/2016  . GERD (gastroesophageal reflux disease) 08/19/2015  . Gout 08/19/2015  . Immunization refused 10/16/2014  . Medial meniscus, posterior horn derangement 02/02/2014  . Diabetes mellitus type 2 in obese ([place]) 06/25/2013  . Chronic pain of both knees 03/23/2013  . Patellar tendonitis 03/22/2013  . Patellar tendinitis of right knee 06/16/2012  . Essential hypertension 04/30/2011  . Obesity 02/10/2008    Rayetta Humphrey, PT CLT [phone] 04/07/2017, 11:01 AM  [facility] [address] [place], [place], [address] Phone: [phone]   Fax:  [phone]  Name: Kelli Spencer MRN: [id number] Date of Birth: [birth year]

## 2017-04-09 ENCOUNTER — Ambulatory Visit (HOSPITAL_COMMUNITY): Payer: Medicare Other | Admitting: Physical Therapy

## 2017-04-09 DIAGNOSIS — R600 Localized edema: Secondary | ICD-10-CM

## 2017-04-09 DIAGNOSIS — M25562 Pain in left knee: Secondary | ICD-10-CM | POA: Diagnosis not present

## 2017-04-09 DIAGNOSIS — M25561 Pain in right knee: Secondary | ICD-10-CM | POA: Diagnosis not present

## 2017-04-09 DIAGNOSIS — G8929 Other chronic pain: Secondary | ICD-10-CM

## 2017-04-09 DIAGNOSIS — M25661 Stiffness of right knee, not elsewhere classified: Secondary | ICD-10-CM

## 2017-04-09 DIAGNOSIS — R2689 Other abnormalities of gait and mobility: Secondary | ICD-10-CM | POA: Diagnosis not present

## 2017-04-09 NOTE — Therapy (Signed)
[place], [place], [address] Phone: [phone]   Fax:  [phone]  Physical Therapy Treatment  Patient Details  Name: Kelli Spencer MRN: [id number] Date of Birth: 07-17-1966 Referring Provider: Arther Abbott   Encounter Date: 04/09/2017      PT End of Session - 04/09/17 1038    Visit Number 2   Number of Visits 12   Date for PT Re-Evaluation 05/07/17   Authorization Type [facility] medicare/ medicaid   Authorization - Visit Number 2   Authorization - Number of Visits 10   PT Start Time 0945   PT Stop Time 1038   PT Time Calculation (min) 53 min   Activity Tolerance Patient tolerated treatment well   Behavior During Therapy [facility] for tasks assessed/performed      Past Medical History:  Diagnosis Date  . Arthritis   . Asthma   . Diabetes mellitus without complication ([place])   . Diastolic hypertension   . GERD (gastroesophageal reflux disease)   . Hyperlipidemia   . Hypertension   . Migraines   . Obesity   . Seizures ([place])    pt states she has seizures only when she gets upset. pt is taking no seizure meds    Past Surgical History:  Procedure Laterality Date  . ABDOMINAL HYSTERECTOMY  1997   fibroids  . COLONOSCOPY N/A 06/23/2016   Procedure: COLONOSCOPY;  Surgeon: Danie Binder, MD;  [facility];  Service: Endoscopy;  Laterality: N/A;  11:30 AM  . KNEE ARTHROSCOPY WITH MEDIAL MENISECTOMY Left 02/09/2014   Procedure: KNEE ARTHROSCOPY WITH MEDIAL MENISECTOMY;  Surgeon: Carole Civil, MD;  Location: [facility];  Service: Orthopedics;  Laterality: Left;  . PARTIAL HYSTERECTOMY    . TOTAL KNEE ARTHROPLASTY Right 03/18/2017   Procedure: TOTAL KNEE ARTHROPLASTY;  Surgeon: Carole Civil, MD;  Location: [facility];  Service: Orthopedics;  Laterality: Right;    There were no vitals filed for this visit.      Subjective Assessment - 04/09/17 0938    Subjective PT comes to the department  walking with her cane.  She did not have a good night last night,(9/10); today 5/10   Pertinent History HTN, DM, back pain    How long can you sit comfortably? Able to sit for 10 minutes   How long can you stand comfortably? Standing for 15 minutes with upper extremity support    How long can you walk comfortably? Walking with her walker in the house only less than five minutes.    Patient Stated Goals walk without an assistive device;  walk longer, stand longer, to do steps    Pain Score 5    Pain Orientation Right   Pain Descriptors / Indicators Aching;Throbbing   Pain Type Acute pain   Pain Onset 1 to 4 weeks ago   Pain Frequency Constant   Aggravating Factors  night time   Pain Relieving Factors ice    Effect of Pain on Daily Activities decrease activity                          [facility] Adult PT Treatment/Exercise - 04/09/17 0001      Ambulation/Gait   Ambulation Distance (Feet) 80 Feet   Assistive device Straight cane   Gait Comments Pt not engaging core and taking uneven strides worked with pt on adjusting gait pattern      Knee/Hip Exercises: Supine  Other Supine Knee/Hip Exercises ankle pump, quad sets and glut sets x 10 each      Knee/Hip Exercises: Prone   Hamstring Curl 10 reps     Modalities   Modalities Cryotherapy     Cryotherapy   Number Minutes Cryotherapy 15 Minutes  done seprerate at end of session without charge    Cryotherapy Location Knee   Type of Cryotherapy Ice pack     Manual Therapy   Manual Therapy Edema management;Soft tissue mobilization;Manual Lymphatic Drainage (MLD)   Manual Lymphatic Drainage (MLD) completed anteriorly and posteriorly to decrease edema.                 PT Education - 04/09/17 1037    Education provided Yes   Education Details continue heel slides and prone knee bends but do not push as incision is opening; don TED hose pt not able to acquire 20-30 mm HG   Person(s) Educated Patient   Methods  Explanation   Comprehension Verbalized understanding          PT Short Term Goals - 04/09/17 1043      PT SHORT TERM GOAL #1   Title PT pain in her right knee to be no greater than a 5/10 to allow pt to be able to walk with least assistive device x 20 minutes to be able to complete short shopping tasks.    Time 2   Period Weeks   Status On-going     PT SHORT TERM GOAL #2   Title PT right knee ROM to be to 95 degrees to allow pt to sit in comfort for 30 minutes to enjoy a meal.    Time 2   Period Weeks   Status On-going     PT SHORT TERM GOAL #3   Title Pt to be able to stand for 30 minutes in order to make a simple meal    Time 2   Period Weeks   Status On-going           PT Long Term Goals - 04/09/17 1044      PT LONG TERM GOAL #1   Title Pt pain to be no greater than a 2/10 to allow pt to walk without an assistive device for up to 40 minutes for longer shopping trips    Time 4   Period Weeks   Status On-going     PT LONG TERM GOAL #2   Title Pt right knee ROM to be to 110 degrees to allow pt to bend down and pick items off the floor with ease.    Time 4   Period Weeks   Status On-going     PT LONG TERM GOAL #3   Title Pt to be able to ascend and descend four steps in a reciprocal manner with a handrail without difficulty.    Time 4   Period Weeks   Status On-going     PT LONG TERM GOAL #4   Title PT single leg stance to be at least 30 seconds on both LE to allow pt to feel comfortable and confident walking over uneven terrain.   Time 4   Period Weeks   Status On-going               Plan - 04/09/17 1040    Clinical Impression Statement PT comes to departement walking with cane but with antalgic gait.  Addressed gait.  Therapist noted area along superior incision that is breaking open  explained to pt to keep covered and keep an eye on this area if it increases she is to call her MD.  Pt states she can no wear compression garment states that just  wearing the TED hose is to much compression for her.  Therapist encouraged pt to at least put the TED Hose on to help with edema control.   Evaluation and goals reviewed with patient.    Rehab Potential Good   PT Frequency 3x / week   PT Duration 4 weeks   PT Treatment/Interventions ADLs/Self Care Home Management;Cryotherapy;Patient/family education;Manual techniques;Manual lymph drainage;Passive range of motion;Gait training;Stair training;Balance training;Electrical Stimulation   PT Next Visit Plan Pt treatment will focus on gait, decreasing pain, decreasing edema and increasing ROM.  Next treatment begin with manual to decrease swelling followed by prone knee flexion, prone quadricep stretch, gait training with no assistive device.  Progress to knee drivers, stool scoots, PROM, stair training.  May use ice and e-stim if swelling does not    PT Home Exercise Plan to continue home health exercises.   Consulted and Agree with Plan of Care Patient      Patient will benefit from skilled therapeutic intervention in order to improve the following deficits and impairments:  Decreased activity tolerance, Decreased balance, Decreased range of motion, Difficulty walking, Increased edema, Pain, Increased fascial restricitons  Visit Diagnosis: Stiffness of right knee, not elsewhere classified  Localized edema  Other abnormalities of gait and mobility  Chronic pain of right knee     Problem List Patient Active Problem List   Diagnosis Date Noted  . Primary osteoarthritis of right knee 03/18/2017  . Dyslipidemia, goal LDL below 100 05/05/2016  . CKD (chronic kidney disease) 02/01/2016  . Vitamin D deficiency 01/16/2016  . GERD (gastroesophageal reflux disease) 08/19/2015  . Gout 08/19/2015  . Immunization refused 10/16/2014  . Medial meniscus, posterior horn derangement 02/02/2014  . Diabetes mellitus type 2 in obese ([place]) 06/25/2013  . Chronic pain of both knees 03/23/2013  . Patellar  tendonitis 03/22/2013  . Patellar tendinitis of right knee 06/16/2012  . Essential hypertension 04/30/2011  . Obesity 02/10/2008    Rayetta Humphrey, PT CLT [phone] 04/09/2017, 10:44 AM  [facility] [address] [place], [place], [address] Phone: [phone]   Fax:  [phone]  Name: Kelli Spencer MRN: [id number] Date of Birth: Jun 29, 1966

## 2017-04-13 ENCOUNTER — Ambulatory Visit (HOSPITAL_COMMUNITY): Payer: Medicare Other | Admitting: Physical Therapy

## 2017-04-13 DIAGNOSIS — M25561 Pain in right knee: Secondary | ICD-10-CM

## 2017-04-13 DIAGNOSIS — R2689 Other abnormalities of gait and mobility: Secondary | ICD-10-CM | POA: Diagnosis not present

## 2017-04-13 DIAGNOSIS — G8929 Other chronic pain: Secondary | ICD-10-CM | POA: Diagnosis not present

## 2017-04-13 DIAGNOSIS — M25661 Stiffness of right knee, not elsewhere classified: Secondary | ICD-10-CM

## 2017-04-13 DIAGNOSIS — M25562 Pain in left knee: Secondary | ICD-10-CM | POA: Diagnosis not present

## 2017-04-13 DIAGNOSIS — R600 Localized edema: Secondary | ICD-10-CM | POA: Diagnosis not present

## 2017-04-13 NOTE — Therapy (Signed)
[place], [place], [address] Phone: [phone]   Fax:  [phone]  Physical Therapy Treatment  Patient Details  Name: Kelli Spencer MRN: [id number] Date of Birth: 1966/08/27 Referring Provider: Arther Abbott   Encounter Date: 04/13/2017      PT End of Session - 04/13/17 1033    Visit Number 3   Number of Visits 12   Date for PT Re-Evaluation 05/07/17   Authorization Type [facility] medicare/ medicaid   Authorization - Visit Number 3   Authorization - Number of Visits 10   PT Start Time 0945   PT Stop Time 1040   PT Time Calculation (min) 55 min   Activity Tolerance Patient tolerated treatment well   Behavior During Therapy [facility] for tasks assessed/performed      Past Medical History:  Diagnosis Date  . Arthritis   . Asthma   . Diabetes mellitus without complication ([place])   . Diastolic hypertension   . GERD (gastroesophageal reflux disease)   . Hyperlipidemia   . Hypertension   . Migraines   . Obesity   . Seizures ([place])    pt states she has seizures only when she gets upset. pt is taking no seizure meds    Past Surgical History:  Procedure Laterality Date  . ABDOMINAL HYSTERECTOMY  1997   fibroids  . COLONOSCOPY N/A 06/23/2016   Procedure: COLONOSCOPY;  Surgeon: Danie Binder, MD;  [facility];  Service: Endoscopy;  Laterality: N/A;  11:30 AM  . KNEE ARTHROSCOPY WITH MEDIAL MENISECTOMY Left 02/09/2014   Procedure: KNEE ARTHROSCOPY WITH MEDIAL MENISECTOMY;  Surgeon: Carole Civil, MD;  Location: [facility];  Service: Orthopedics;  Laterality: Left;  . PARTIAL HYSTERECTOMY    . TOTAL KNEE ARTHROPLASTY Right 03/18/2017   Procedure: TOTAL KNEE ARTHROPLASTY;  Surgeon: Carole Civil, MD;  Location: [facility];  Service: Orthopedics;  Laterality: Right;    There were no vitals filed for this visit.      Subjective Assessment - 04/13/17 1040    Subjective PT states swelling is still  present.  5/10 currently.    Currently in Pain? Yes   Pain Score 5    Pain Location Knee   Pain Orientation Right   Pain Descriptors / Indicators Aching;Throbbing   Pain Type Acute pain                         [facility] Adult PT Treatment/Exercise - 04/13/17 0001      Ambulation/Gait   Ambulation Distance (Feet) 226 Feet   Assistive device Straight cane   Gait Pattern Step-through pattern     Knee/Hip Exercises: Stretches   Knee: Self-Stretch to increase Flexion Right;10 seconds   Knee: Self-Stretch Limitations 10 reps onto 12" step     Knee/Hip Exercises: Standing   Heel Raises Both;15 reps   Heel Raises Limitations toeraises 15 reps     Knee/Hip Exercises: Supine   Quad Sets Right;15 reps   Heel Slides Right;10 reps   Knee Extension Limitations 7   Knee Flexion Limitations 85     Modalities   Modalities Cryotherapy;Electrical Stimulation     Cryotherapy   Number Minutes Cryotherapy 15 Minutes   Cryotherapy Location Knee  right with estim   Type of Cryotherapy Ice pack     Electrical Stimulation   Electrical Stimulation Location Rt knee   Electrical Stimulation Action hi volt to decrease edema  Electrical Stimulation Parameters pad placement medial and lateral Rt knee.  90 Volts 15 mins   Electrical Stimulation Goals Edema     Manual Therapy   Manual Therapy Edema management;Myofascial release   Manual therapy comments completed seperately from all other skilled interventions   Edema Management retro massage to decrease edema   Myofascial Release to adhesions/scar tissue proximal scar line and perimeter of knee                PT Education - 04/13/17 1028    Education provided Yes   Education Details educated on difference between TEDS and compression hose.  Measured LE and given sheet to order thigh highs from [employer].   Person(s) Educated Patient   Methods Explanation;Handout   Comprehension Verbalized understanding           PT Short Term Goals - 04/09/17 1043      PT SHORT TERM GOAL #1   Title PT pain in her right knee to be no greater than a 5/10 to allow pt to be able to walk with least assistive device x 20 minutes to be able to complete short shopping tasks.    Time 2   Period Weeks   Status On-going     PT SHORT TERM GOAL #2   Title PT right knee ROM to be to 95 degrees to allow pt to sit in comfort for 30 minutes to enjoy a meal.    Time 2   Period Weeks   Status On-going     PT SHORT TERM GOAL #3   Title Pt to be able to stand for 30 minutes in order to make a simple meal    Time 2   Period Weeks   Status On-going           PT Long Term Goals - 04/09/17 1044      PT LONG TERM GOAL #1   Title Pt pain to be no greater than a 2/10 to allow pt to walk without an assistive device for up to 40 minutes for longer shopping trips    Time 4   Period Weeks   Status On-going     PT LONG TERM GOAL #2   Title Pt right knee ROM to be to 110 degrees to allow pt to bend down and pick items off the floor with ease.    Time 4   Period Weeks   Status On-going     PT LONG TERM GOAL #3   Title Pt to be able to ascend and descend four steps in a reciprocal manner with a handrail without difficulty.    Time 4   Period Weeks   Status On-going     PT LONG TERM GOAL #4   Title PT single leg stance to be at least 30 seconds on both LE to allow pt to feel comfortable and confident walking over uneven terrain.   Time 4   Period Weeks   Status On-going               Plan - 04/13/17 1034    Clinical Impression Statement Pt with overall improved gait this session using SPC with less cues needed.  continued swelling in Rt knee.  Educated on compression stockings (see patient education).  Myofascial techniques completed to Rt knee with concentration on lateral proximal knee.  Several adhesions/knots along superior portion of scar line.  ROM holds at 7-85 today following manual techniques.  Added hi  volt  estim along with ice this session to attempt removal of more edema following retro massage techniques.  Pt reported overall reduction of pain at end of session.     Rehab Potential Good   PT Frequency 3x / week   PT Duration 4 weeks   PT Treatment/Interventions ADLs/Self Care Home Management;Cryotherapy;Patient/family education;Manual techniques;Manual lymph drainage;Passive range of motion;Gait training;Stair training;Balance training;Electrical Stimulation   PT Next Visit Plan Pt treatment will focus on gait, decreasing pain, decreasing edema and increasing ROM.  Assess how e-stim helped with swelling; check on ordering of compression garments.    PT Home Exercise Plan to continue home health exercises.   Consulted and Agree with Plan of Care Patient      Patient will benefit from skilled therapeutic intervention in order to improve the following deficits and impairments:  Decreased activity tolerance, Decreased balance, Decreased range of motion, Difficulty walking, Increased edema, Pain, Increased fascial restricitons  Visit Diagnosis: Stiffness of right knee, not elsewhere classified  Localized edema  Other abnormalities of gait and mobility  Chronic pain of right knee     Problem List Patient Active Problem List   Diagnosis Date Noted  . Primary osteoarthritis of right knee 03/18/2017  . Dyslipidemia, goal LDL below 100 05/05/2016  . CKD (chronic kidney disease) 02/01/2016  . Vitamin D deficiency 01/16/2016  . GERD (gastroesophageal reflux disease) 08/19/2015  . Gout 08/19/2015  . Immunization refused 10/16/2014  . Medial meniscus, posterior horn derangement 02/02/2014  . Diabetes mellitus type 2 in obese ([place]) 06/25/2013  . Chronic pain of both knees 03/23/2013  . Patellar tendonitis 03/22/2013  . Patellar tendinitis of right knee 06/16/2012  . Essential hypertension 04/30/2011  . Obesity 02/10/2008    Teena Irani, PTA/CLT [phone]  04/13/2017, 10:44  AM  [facility] [address] [place], [place], [address] Phone: [phone]   Fax:  [phone]  Name: Kelli Spencer MRN: [id number] Date of Birth: 1966-06-11

## 2017-04-15 ENCOUNTER — Encounter (HOSPITAL_COMMUNITY): Payer: Self-pay

## 2017-04-15 ENCOUNTER — Ambulatory Visit (HOSPITAL_COMMUNITY): Payer: Medicare Other

## 2017-04-15 DIAGNOSIS — R2689 Other abnormalities of gait and mobility: Secondary | ICD-10-CM | POA: Diagnosis not present

## 2017-04-15 DIAGNOSIS — G8929 Other chronic pain: Secondary | ICD-10-CM | POA: Diagnosis not present

## 2017-04-15 DIAGNOSIS — M25561 Pain in right knee: Secondary | ICD-10-CM | POA: Diagnosis not present

## 2017-04-15 DIAGNOSIS — M25661 Stiffness of right knee, not elsewhere classified: Secondary | ICD-10-CM | POA: Diagnosis not present

## 2017-04-15 DIAGNOSIS — M25562 Pain in left knee: Secondary | ICD-10-CM | POA: Diagnosis not present

## 2017-04-15 DIAGNOSIS — R600 Localized edema: Secondary | ICD-10-CM

## 2017-04-15 NOTE — Therapy (Signed)
[place], [place], [address] Phone: [phone]   Fax:  [phone]  Physical Therapy Treatment  Patient Details  Name: Kelli Spencer MRN: [id number] Date of Birth: 11/09/1965 Referring Provider: Arther Abbott   Encounter Date: 04/15/2017      PT End of Session - 04/15/17 0949    Visit Number 4   Number of Visits 12   Date for PT Re-Evaluation 05/07/17   Authorization Type [employer] medicare/ medicaid   Authorization - Visit Number 4   Authorization - Number of Visits 10   PT Start Time [phone]   PT Stop Time 1027   PT Time Calculation (min) 41 min   Activity Tolerance Patient tolerated treatment well   Behavior During Therapy [facility] for tasks assessed/performed      Past Medical History:  Diagnosis Date  . Arthritis   . Asthma   . Diabetes mellitus without complication ([place])   . Diastolic hypertension   . GERD (gastroesophageal reflux disease)   . Hyperlipidemia   . Hypertension   . Migraines   . Obesity   . Seizures ([place])    pt states she has seizures only when she gets upset. pt is taking no seizure meds    Past Surgical History:  Procedure Laterality Date  . ABDOMINAL HYSTERECTOMY  1997   fibroids  . COLONOSCOPY N/A 06/23/2016   Procedure: COLONOSCOPY;  Surgeon: Danie Binder, MD;  [facility];  Service: Endoscopy;  Laterality: N/A;  11:30 AM  . KNEE ARTHROSCOPY WITH MEDIAL MENISECTOMY Left 02/09/2014   Procedure: KNEE ARTHROSCOPY WITH MEDIAL MENISECTOMY;  Surgeon: Carole Civil, MD;  Location: [facility];  Service: Orthopedics;  Laterality: Left;  . PARTIAL HYSTERECTOMY    . TOTAL KNEE ARTHROPLASTY Right 03/18/2017   Procedure: TOTAL KNEE ARTHROPLASTY;  Surgeon: Carole Civil, MD;  Location: [facility];  Service: Orthopedics;  Laterality: Right;    There were no vitals filed for this visit.      Subjective Assessment - 04/15/17 0947    Subjective Pt states that she has been  having a tough morning due to the swelling. She states that she went to church last night and her R foot and leg were really swollen afterwards.    Pertinent History HTN, DM, back pain    How long can you sit comfortably? Able to sit for 10 minutes   How long can you stand comfortably? Standing for 15 minutes with upper extremity support    How long can you walk comfortably? Walking with her walker in the house only less than five minutes.    Patient Stated Goals walk without an assistive device;  walk longer, stand longer, to do steps    Currently in Pain? Yes   Pain Score 7    Pain Location Knee   Pain Orientation Right   Pain Descriptors / Indicators Aching;Throbbing   Pain Type Acute pain   Pain Onset 1 to 4 weeks ago   Pain Frequency Constant   Aggravating Factors  night time   Pain Relieving Factors ice   Effect of Pain on Daily Activities decrease activity               [facility] Adult PT Treatment/Exercise - 04/15/17 0001      Knee/Hip Exercises: Stretches   Sports administrator Right;3 reps;20 seconds   Quad Stretch Limitations prone with sheet   Knee: Self-Stretch to increase Flexion Right;10 seconds   Knee:  Self-Stretch Limitations 10 reps onto 12" step   Gastroc Stretch Both;3 reps;30 seconds   Gastroc Stretch Limitations slant board (about half of her foot on the board due to increased tightness)     Knee/Hip Exercises: Supine   Quad Sets Right;2 sets;15 reps   Quad Sets Limitations max cueing for proper quad contraction   Heel Slides Right;15 reps   Knee Extension Limitations 8   Knee Flexion Limitations 90     Knee/Hip Exercises: Prone   Prone Knee Hang 3 minutes     Manual Therapy   Manual Therapy Edema management;Myofascial release   Manual therapy comments completed seperately from all other skilled interventions   Edema Management retro massage with BLE elevated to decrease edema   Myofascial Release to adhesions along proximal scar and distal VMO                 PT Short Term Goals - 04/09/17 1043      PT SHORT TERM GOAL #1   Title PT pain in her right knee to be no greater than a 5/10 to allow pt to be able to walk with least assistive device x 20 minutes to be able to complete short shopping tasks.    Time 2   Period Weeks   Status On-going     PT SHORT TERM GOAL #2   Title PT right knee ROM to be to 95 degrees to allow pt to sit in comfort for 30 minutes to enjoy a meal.    Time 2   Period Weeks   Status On-going     PT SHORT TERM GOAL #3   Title Pt to be able to stand for 30 minutes in order to make a simple meal    Time 2   Period Weeks   Status On-going           PT Long Term Goals - 04/09/17 1044      PT LONG TERM GOAL #1   Title Pt pain to be no greater than a 2/10 to allow pt to walk without an assistive device for up to 40 minutes for longer shopping trips    Time 4   Period Weeks   Status On-going     PT LONG TERM GOAL #2   Title Pt right knee ROM to be to 110 degrees to allow pt to bend down and pick items off the floor with ease.    Time 4   Period Weeks   Status On-going     PT LONG TERM GOAL #3   Title Pt to be able to ascend and descend four steps in a reciprocal manner with a handrail without difficulty.    Time 4   Period Weeks   Status On-going     PT LONG TERM GOAL #4   Title PT single leg stance to be at least 30 seconds on both LE to allow pt to feel comfortable and confident walking over uneven terrain.   Time 4   Period Weeks   Status On-going               Plan - 04/15/17 1028    Clinical Impression Statement Pt continues to present with increased edema and pain of R knee. Began session with manual therapy to address swelling and soft tissue restrictions. Pt had difficulty with eliciting a quality quad contraction during quad sets and required max tactile cueing to improve. Rest of session focused on improved ROM and she  tolerated it well. AROM 8-90 at EOS. Pt  verbalized that she is supposed to get her compression garments on Friday. Continue POC as planned.   Rehab Potential Good   PT Frequency 3x / week   PT Duration 4 weeks   PT Treatment/Interventions ADLs/Self Care Home Management;Cryotherapy;Patient/family education;Manual techniques;Manual lymph drainage;Passive range of motion;Gait training;Stair training;Balance training;Electrical Stimulation   PT Next Visit Plan Pt treatment will focus on gait, decreasing pain, decreasing edema and increasing ROM.  double check on compression garments; continue calf stretching, add in TKE   PT Home Exercise Plan to continue home health exercises.   Consulted and Agree with Plan of Care Patient      Patient will benefit from skilled therapeutic intervention in order to improve the following deficits and impairments:  Decreased activity tolerance, Decreased balance, Decreased range of motion, Difficulty walking, Increased edema, Pain, Increased fascial restricitons  Visit Diagnosis: Stiffness of right knee, not elsewhere classified  Localized edema  Other abnormalities of gait and mobility  Chronic pain of right knee     Problem List Patient Active Problem List   Diagnosis Date Noted  . Primary osteoarthritis of right knee 03/18/2017  . Dyslipidemia, goal LDL below 100 05/05/2016  . CKD (chronic kidney disease) 02/01/2016  . Vitamin D deficiency 01/16/2016  . GERD (gastroesophageal reflux disease) 08/19/2015  . Gout 08/19/2015  . Immunization refused 10/16/2014  . Medial meniscus, posterior horn derangement 02/02/2014  . Diabetes mellitus type 2 in obese ([place]) 06/25/2013  . Chronic pain of both knees 03/23/2013  . Patellar tendonitis 03/22/2013  . Patellar tendinitis of right knee 06/16/2012  . Essential hypertension 04/30/2011  . Obesity 02/10/2008     Geraldine Solar PT, [place] [address] [place], [place], [address] Phone:  [phone]   Fax:  [phone]  Name: JAIYAH BEINING MRN: [id number] Date of Birth: April 14, 1966

## 2017-04-17 ENCOUNTER — Ambulatory Visit (HOSPITAL_COMMUNITY): Payer: Medicare Other | Admitting: Physical Therapy

## 2017-04-17 DIAGNOSIS — M25661 Stiffness of right knee, not elsewhere classified: Secondary | ICD-10-CM | POA: Diagnosis not present

## 2017-04-17 DIAGNOSIS — G8929 Other chronic pain: Secondary | ICD-10-CM | POA: Diagnosis not present

## 2017-04-17 DIAGNOSIS — R2689 Other abnormalities of gait and mobility: Secondary | ICD-10-CM | POA: Diagnosis not present

## 2017-04-17 DIAGNOSIS — M25561 Pain in right knee: Secondary | ICD-10-CM

## 2017-04-17 DIAGNOSIS — M25562 Pain in left knee: Secondary | ICD-10-CM | POA: Diagnosis not present

## 2017-04-17 DIAGNOSIS — R600 Localized edema: Secondary | ICD-10-CM

## 2017-04-17 NOTE — Therapy (Signed)
[place], [place], [address] Phone: [phone]   Fax:  [phone]  Physical Therapy Treatment  Patient Details  Name: Kelli Spencer MRN: [id number] Date of Birth: 1966/03/15 Referring Provider: Arther Abbott   Encounter Date: 04/17/2017      PT End of Session - 04/17/17 1023    Visit Number 5   Number of Visits 12   Date for PT Re-Evaluation 05/07/17   Authorization Type UHC medicare/ medicaid   Authorization - Visit Number 5   Authorization - Number of Visits 10   PT Start Time [phone]   PT Stop Time 1027   PT Time Calculation (min) 39 min   Activity Tolerance Patient tolerated treatment well   Behavior During Therapy [facility] for tasks assessed/performed      Past Medical History:  Diagnosis Date  . Arthritis   . Asthma   . Diabetes mellitus without complication ([place])   . Diastolic hypertension   . GERD (gastroesophageal reflux disease)   . Hyperlipidemia   . Hypertension   . Migraines   . Obesity   . Seizures ([place])    pt states she has seizures only when she gets upset. pt is taking no seizure meds    Past Surgical History:  Procedure Laterality Date  . ABDOMINAL HYSTERECTOMY  1997   fibroids  . COLONOSCOPY N/A 06/23/2016   Procedure: COLONOSCOPY;  Surgeon: Danie Binder, MD;  [facility];  Service: Endoscopy;  Laterality: N/A;  11:30 AM  . KNEE ARTHROSCOPY WITH MEDIAL MENISECTOMY Left 02/09/2014   Procedure: KNEE ARTHROSCOPY WITH MEDIAL MENISECTOMY;  Surgeon: Carole Civil, MD;  Location: [facility];  Service: Orthopedics;  Laterality: Left;  . PARTIAL HYSTERECTOMY    . TOTAL KNEE ARTHROPLASTY Right 03/18/2017   Procedure: TOTAL KNEE ARTHROPLASTY;  Surgeon: Carole Civil, MD;  Location: [facility];  Service: Orthopedics;  Laterality: Right;    There were no vitals filed for this visit.      Subjective Assessment - 04/17/17 0948    Subjective Pt states that her knee is  feeling better.  Everything is getting easier for her to do.  She is still having to take breaks with cooking    Pertinent History HTN, DM, back pain    How long can you sit comfortably? Able to sit for 10 minutes   How long can you stand comfortably? Standing for 15 minutes with upper extremity support    How long can you walk comfortably? Walking with her walker in the house only less than five minutes.    Patient Stated Goals walk without an assistive device;  walk longer, stand longer, to do steps    Currently in Pain? Yes   Pain Score 2    Pain Orientation Right;Anterior   Pain Type Acute pain   Pain Onset 1 to 4 weeks ago                         [facility] Adult PT Treatment/Exercise - 04/17/17 0001      Knee/Hip Exercises: Stretches   Sports administrator Right;3 reps;20 seconds   Quad Stretch Limitations prone with sheet   Knee: Self-Stretch to increase Flexion Right;10 seconds   Knee: Self-Stretch Limitations 10 reps onto 12" step   Gastroc Stretch Both;3 reps;30 seconds   Gastroc Stretch Limitations slant board (about half of her foot on the board due to increased tightness)  Knee/Hip Exercises: Standing   Heel Raises 15 reps   Knee Flexion Right;10 reps   Terminal Knee Extension Limitations 10   Functional Squat 15 reps   Rocker Board 2 minutes   SLS 5     Knee/Hip Exercises: Supine   Quad Sets Right;1 set;15 reps   Heel Slides Right;15 reps   Knee Extension Limitations 4   Knee Flexion Limitations 98     Manual Therapy   Manual Therapy Edema management;Myofascial release   Manual therapy comments completed seperately from all other skilled interventions   Edema Management retro massage with BLE elevated to decrease edema   Myofascial Release to adhesions along proximal scar and distal VMO                  PT Short Term Goals - 04/09/17 1043      PT SHORT TERM GOAL #1   Title PT pain in her right knee to be no greater than a 5/10 to allow pt  to be able to walk with least assistive device x 20 minutes to be able to complete short shopping tasks.    Time 2   Period Weeks   Status On-going     PT SHORT TERM GOAL #2   Title PT right knee ROM to be to 95 degrees to allow pt to sit in comfort for 30 minutes to enjoy a meal.    Time 2   Period Weeks   Status On-going     PT SHORT TERM GOAL #3   Title Pt to be able to stand for 30 minutes in order to make a simple meal    Time 2   Period Weeks   Status On-going           PT Long Term Goals - 04/09/17 1044      PT LONG TERM GOAL #1   Title Pt pain to be no greater than a 2/10 to allow pt to walk without an assistive device for up to 40 minutes for longer shopping trips    Time 4   Period Weeks   Status On-going     PT LONG TERM GOAL #2   Title Pt right knee ROM to be to 110 degrees to allow pt to bend down and pick items off the floor with ease.    Time 4   Period Weeks   Status On-going     PT LONG TERM GOAL #3   Title Pt to be able to ascend and descend four steps in a reciprocal manner with a handrail without difficulty.    Time 4   Period Weeks   Status On-going     PT LONG TERM GOAL #4   Title PT single leg stance to be at least 30 seconds on both LE to allow pt to feel comfortable and confident walking over uneven terrain.   Time 4   Period Weeks   Status On-going               Plan - 04/17/17 1024    Clinical Impression Statement Pt swelling is slowly decreasing with the expected improved ROM.  Introduced more closed chain exercises focusing more on range and balance than on strength.  Pt now walking withou an assisitve device.    Rehab Potential Good   PT Frequency 3x / week   PT Duration 4 weeks   PT Treatment/Interventions ADLs/Self Care Home Management;Cryotherapy;Patient/family education;Manual techniques;Manual lymph drainage;Passive range of motion;Gait training;Stair training;Balance training;Electrical  Stimulation   PT Next Visit Plan  Start functional strengthing exercises making sure that this is not increasing pt edema.    PT Home Exercise Plan to continue home health exercises.   Consulted and Agree with Plan of Care Patient      Patient will benefit from skilled therapeutic intervention in order to improve the following deficits and impairments:  Decreased activity tolerance, Decreased balance, Decreased range of motion, Difficulty walking, Increased edema, Pain, Increased fascial restricitons  Visit Diagnosis: Stiffness of right knee, not elsewhere classified  Localized edema  Other abnormalities of gait and mobility  Chronic pain of right knee     Problem List Patient Active Problem List   Diagnosis Date Noted  . Primary osteoarthritis of right knee 03/18/2017  . Dyslipidemia, goal LDL below 100 05/05/2016  . CKD (chronic kidney disease) 02/01/2016  . Vitamin D deficiency 01/16/2016  . GERD (gastroesophageal reflux disease) 08/19/2015  . Gout 08/19/2015  . Immunization refused 10/16/2014  . Medial meniscus, posterior horn derangement 02/02/2014  . Diabetes mellitus type 2 in obese ([place]) 06/25/2013  . Chronic pain of both knees 03/23/2013  . Patellar tendonitis 03/22/2013  . Patellar tendinitis of right knee 06/16/2012  . Essential hypertension 04/30/2011  . Obesity 02/10/2008   Rayetta Humphrey, PT CLT [phone] 04/17/2017, 10:28 AM  [place] [address] [place], [place], [address] Phone: [phone]   Fax:  [phone]  Name: KIERYN BURTIS MRN: [id number] Date of Birth: 08/23/1966

## 2017-04-20 ENCOUNTER — Telehealth (HOSPITAL_COMMUNITY): Payer: Self-pay | Admitting: Family Medicine

## 2017-04-20 ENCOUNTER — Ambulatory Visit (HOSPITAL_COMMUNITY): Payer: Medicare Other | Admitting: Physical Therapy

## 2017-04-20 NOTE — Telephone Encounter (Signed)
04/20/17 pt left a messge that she has been sick all weekend and still wasn't feeling to good.

## 2017-04-22 ENCOUNTER — Encounter (HOSPITAL_COMMUNITY): Payer: Self-pay

## 2017-04-22 ENCOUNTER — Ambulatory Visit (HOSPITAL_COMMUNITY): Payer: Medicare Other

## 2017-04-22 DIAGNOSIS — R2689 Other abnormalities of gait and mobility: Secondary | ICD-10-CM | POA: Diagnosis not present

## 2017-04-22 DIAGNOSIS — G8929 Other chronic pain: Secondary | ICD-10-CM | POA: Diagnosis not present

## 2017-04-22 DIAGNOSIS — M25661 Stiffness of right knee, not elsewhere classified: Secondary | ICD-10-CM

## 2017-04-22 DIAGNOSIS — R600 Localized edema: Secondary | ICD-10-CM | POA: Diagnosis not present

## 2017-04-22 DIAGNOSIS — M25561 Pain in right knee: Secondary | ICD-10-CM | POA: Diagnosis not present

## 2017-04-22 DIAGNOSIS — M25562 Pain in left knee: Secondary | ICD-10-CM | POA: Diagnosis not present

## 2017-04-22 NOTE — Therapy (Signed)
[place], [place], [address] Phone: [phone]   Fax:  [phone]  Physical Therapy Treatment  Patient Details  Name: Kelli Spencer MRN: [id number] Date of Birth: May 03, 1966 Referring Provider: Arther Abbott   Encounter Date: 04/22/2017      PT End of Session - 04/22/17 0950    Visit Number 6   Number of Visits 12   Date for PT Re-Evaluation 05/07/17   Authorization Type UHC medicare/ medicaid   Authorization - Visit Number 6   Authorization - Number of Visits 10   PT Start Time [phone]   PT Stop Time 1027   PT Time Calculation (min) 40 min   Activity Tolerance Patient tolerated treatment well   Behavior During Therapy [facility] for tasks assessed/performed      Past Medical History:  Diagnosis Date  . Arthritis   . Asthma   . Diabetes mellitus without complication ([place])   . Diastolic hypertension   . GERD (gastroesophageal reflux disease)   . Hyperlipidemia   . Hypertension   . Migraines   . Obesity   . Seizures ([place])    pt states she has seizures only when she gets upset. pt is taking no seizure meds    Past Surgical History:  Procedure Laterality Date  . ABDOMINAL HYSTERECTOMY  1997   fibroids  . COLONOSCOPY N/A 06/23/2016   Procedure: COLONOSCOPY;  Surgeon: Danie Binder, MD;  [facility];  Service: Endoscopy;  Laterality: N/A;  11:30 AM  . KNEE ARTHROSCOPY WITH MEDIAL MENISECTOMY Left 02/09/2014   Procedure: KNEE ARTHROSCOPY WITH MEDIAL MENISECTOMY;  Surgeon: Carole Civil, MD;  Location: [facility];  Service: Orthopedics;  Laterality: Left;  . PARTIAL HYSTERECTOMY    . TOTAL KNEE ARTHROPLASTY Right 03/18/2017   Procedure: TOTAL KNEE ARTHROPLASTY;  Surgeon: Carole Civil, MD;  Location: [facility];  Service: Orthopedics;  Laterality: Right;    There were no vitals filed for this visit.      Subjective Assessment - 04/22/17 0949    Subjective Pt states that she had a head  cold and she is finally feeling better from that. Her knee is feeling better and better.    Pertinent History HTN, DM, back pain    How long can you sit comfortably? Able to sit for 10 minutes   How long can you stand comfortably? Standing for 15 minutes with upper extremity support    How long can you walk comfortably? Walking with her walker in the house only less than five minutes.    Patient Stated Goals walk without an assistive device;  walk longer, stand longer, to do steps    Currently in Pain? Yes   Pain Score 2    Pain Location Knee   Pain Orientation Right;Anterior   Pain Descriptors / Indicators Aching;Throbbing   Pain Type Acute pain   Pain Onset 1 to 4 weeks ago   Pain Frequency Constant   Aggravating Factors  night time   Pain Relieving Factors ice   Effect of Pain on Daily Activities decrease activity               [facility] Adult PT Treatment/Exercise - 04/22/17 0001      Knee/Hip Exercises: Stretches   Sports administrator Right;3 reps;30 seconds   Quad Stretch Limitations prone with sheet   Knee: Self-Stretch to increase Flexion Right;10 seconds   Knee: Self-Stretch Limitations 10 reps onto 12" step   Gastroc  Stretch Both;3 reps;30 seconds   Gastroc Stretch Limitations slant board (about half of her foot on the board due to increased tightness)     Knee/Hip Exercises: Standing   Forward Lunges Both;2 sets;10 reps   Forward Lunges Limitations max cueing for proper technique   Lateral Step Up Right;2 sets;10 reps;Hand Hold: 0;Step Height: 4"   Forward Step Up Right;2 sets;10 reps;Hand Hold: 0;Step Height: 4"   Step Down Right;15 reps;1 set;Hand Hold: 1;Step Height: 4"   Step Down Limitations heel taps; pt unsteady throughout   Functional Squat 15 reps     Knee/Hip Exercises: Seated   Sit to Sand 10 reps;without UE support  from chair     Knee/Hip Exercises: Supine   Knee Extension Limitations 5   Knee Flexion Limitations 100     Manual Therapy   Manual  Therapy Myofascial release   Manual therapy comments completed seperately from all other skilled interventions   Myofascial Release to adhesions along proximal scar and distal VMO                PT Education - 04/22/17 1027    Education provided Yes   Education Details exercise technique, may be more sore following this session   Person(s) Educated Patient   Methods Explanation;Demonstration   Comprehension Verbalized understanding;Returned demonstration          PT Short Term Goals - 04/09/17 1043      PT SHORT TERM GOAL #1   Title PT pain in her right knee to be no greater than a 5/10 to allow pt to be able to walk with least assistive device x 20 minutes to be able to complete short shopping tasks.    Time 2   Period Weeks   Status On-going     PT SHORT TERM GOAL #2   Title PT right knee ROM to be to 95 degrees to allow pt to sit in comfort for 30 minutes to enjoy a meal.    Time 2   Period Weeks   Status On-going     PT SHORT TERM GOAL #3   Title Pt to be able to stand for 30 minutes in order to make a simple meal    Time 2   Period Weeks   Status On-going           PT Long Term Goals - 04/09/17 1044      PT LONG TERM GOAL #1   Title Pt pain to be no greater than a 2/10 to allow pt to walk without an assistive device for up to 40 minutes for longer shopping trips    Time 4   Period Weeks   Status On-going     PT LONG TERM GOAL #2   Title Pt right knee ROM to be to 110 degrees to allow pt to bend down and pick items off the floor with ease.    Time 4   Period Weeks   Status On-going     PT LONG TERM GOAL #3   Title Pt to be able to ascend and descend four steps in a reciprocal manner with a handrail without difficulty.    Time 4   Period Weeks   Status On-going     PT LONG TERM GOAL #4   Title PT single leg stance to be at least 30 seconds on both LE to allow pt to feel comfortable and confident walking over uneven terrain.   Time 4   Period  Weeks   Status On-going               Plan - 04/22/17 1028    Clinical Impression Statement Began session with manual to address soft tissue restrictions in distal VMO and along proximal scar. Progressed pt to more functional strengthening this date. She was able to perform all exercises without increased pain but did demo increased fatigue and weakness, especially with heel taps. Educated pt that she may be more sore following this session and she verbalized understanding. Assess how pt tolerated progressed therex next session; if she did well with it, continue to progress functional strengthening as tolerated   Rehab Potential Good   PT Frequency 3x / week   PT Duration 4 weeks   PT Treatment/Interventions ADLs/Self Care Home Management;Cryotherapy;Patient/family education;Manual techniques;Manual lymph drainage;Passive range of motion;Gait training;Stair training;Balance training;Electrical Stimulation   PT Next Visit Plan Continue functional strengthing exercises pending how she responded to this session/as long as this did not increase edema.    PT Home Exercise Plan to continue home health exercises.   Consulted and Agree with Plan of Care Patient      Patient will benefit from skilled therapeutic intervention in order to improve the following deficits and impairments:  Decreased activity tolerance, Decreased balance, Decreased range of motion, Difficulty walking, Increased edema, Pain, Increased fascial restricitons  Visit Diagnosis: Stiffness of right knee, not elsewhere classified  Localized edema  Other abnormalities of gait and mobility  Chronic pain of right knee     Problem List Patient Active Problem List   Diagnosis Date Noted  . Primary osteoarthritis of right knee 03/18/2017  . Dyslipidemia, goal LDL below 100 05/05/2016  . CKD (chronic kidney disease) 02/01/2016  . Vitamin D deficiency 01/16/2016  . GERD (gastroesophageal reflux disease) 08/19/2015  .  Gout 08/19/2015  . Immunization refused 10/16/2014  . Medial meniscus, posterior horn derangement 02/02/2014  . Diabetes mellitus type 2 in obese ([place]) 06/25/2013  . Chronic pain of both knees 03/23/2013  . Patellar tendonitis 03/22/2013  . Patellar tendinitis of right knee 06/16/2012  . Essential hypertension 04/30/2011  . Obesity 02/10/2008     Geraldine Solar PT, [place] [address] [place], [place], [address] Phone: [phone]   Fax:  [phone]  Name: Kelli Spencer MRN: [id number] Date of Birth: 11/23/1965

## 2017-04-23 ENCOUNTER — Other Ambulatory Visit: Payer: Self-pay | Admitting: Family Medicine

## 2017-04-24 ENCOUNTER — Ambulatory Visit (HOSPITAL_COMMUNITY): Payer: Medicare Other

## 2017-04-24 DIAGNOSIS — R600 Localized edema: Secondary | ICD-10-CM

## 2017-04-24 DIAGNOSIS — M25661 Stiffness of right knee, not elsewhere classified: Secondary | ICD-10-CM

## 2017-04-24 DIAGNOSIS — M25562 Pain in left knee: Secondary | ICD-10-CM | POA: Diagnosis not present

## 2017-04-24 DIAGNOSIS — M25561 Pain in right knee: Secondary | ICD-10-CM | POA: Diagnosis not present

## 2017-04-24 DIAGNOSIS — R2689 Other abnormalities of gait and mobility: Secondary | ICD-10-CM | POA: Diagnosis not present

## 2017-04-24 DIAGNOSIS — G8929 Other chronic pain: Secondary | ICD-10-CM | POA: Diagnosis not present

## 2017-04-24 NOTE — Therapy (Signed)
[place], [place], [address] Phone: [phone]   Fax:  [phone]  Physical Therapy Treatment  Patient Details  Name: Kelli Spencer MRN: [id number] Date of Birth: 07/29/1966 Referring Provider: Arther Abbott   Encounter Date: 04/24/2017      PT End of Session - 04/24/17 1441    Visit Number 7   Number of Visits 12   Date for PT Re-Evaluation 05/07/17   Authorization Type UHC medicare/ medicaid   Authorization - Visit Number 7   Authorization - Number of Visits 10   PT Start Time 6962   PT Stop Time 1513   PT Time Calculation (min) 41 min   Activity Tolerance Patient tolerated treatment well;No increased pain   Behavior During Therapy [facility] for tasks assessed/performed      Past Medical History:  Diagnosis Date  . Arthritis   . Asthma   . Diabetes mellitus without complication ([place])   . Diastolic hypertension   . GERD (gastroesophageal reflux disease)   . Hyperlipidemia   . Hypertension   . Migraines   . Obesity   . Seizures ([place])    pt states she has seizures only when she gets upset. pt is taking no seizure meds    Past Surgical History:  Procedure Laterality Date  . ABDOMINAL HYSTERECTOMY  1997   fibroids  . COLONOSCOPY N/A 06/23/2016   Procedure: COLONOSCOPY;  Surgeon: Danie Binder, MD;  [facility];  Service: Endoscopy;  Laterality: N/A;  11:30 AM  . KNEE ARTHROSCOPY WITH MEDIAL MENISECTOMY Left 02/09/2014   Procedure: KNEE ARTHROSCOPY WITH MEDIAL MENISECTOMY;  Surgeon: Carole Civil, MD;  Location: [facility];  Service: Orthopedics;  Laterality: Left;  . PARTIAL HYSTERECTOMY    . TOTAL KNEE ARTHROPLASTY Right 03/18/2017   Procedure: TOTAL KNEE ARTHROPLASTY;  Surgeon: Carole Civil, MD;  Location: [facility];  Service: Orthopedics;  Laterality: Right;    There were no vitals filed for this visit.      Subjective Assessment - 04/24/17 1432    Subjective Pt stated she  has had a head cold this week and is starting to feekl better.  reports knee is tight today, does continue to have edema present.  Does have compression hose that usually wears daily   Patient Stated Goals walk without an assistive device;  walk longer, stand longer, to do steps    Currently in Pain? Yes   Pain Score 5    Pain Location Knee   Pain Orientation Left   Pain Descriptors / Indicators Tightness   Pain Type Acute pain   Pain Onset 1 to 4 weeks ago   Pain Frequency Constant   Aggravating Factors  night time   Pain Relieving Factors ice   Effect of Pain on Daily Activities decrease activity                         [facility] Adult PT Treatment/Exercise - 04/24/17 0001      Ambulation/Gait   Ambulation Distance (Feet) 226 Feet   Gait Comments Cueing to improve heel strike and knee flexion; Pt not engaging core and taking uneven strides worked with pt on adjusting gait pattern, increased cueing for heel to toe     Knee/Hip Exercises: Stretches   Sports administrator Right;3 reps;30 seconds   Quad Stretch Limitations prone with sheet   Knee: Self-Stretch to increase Flexion Right;10 seconds   Knee: Self-Stretch  Limitations 10 reps onto 12" step     Knee/Hip Exercises: Standing   Knee Flexion Right;15 reps   Terminal Knee Extension Limitations 15x 5" RTB   Lateral Step Up Right;10 reps;Hand Hold: 1;Step Height: 4"   Forward Step Up Right;1 set;15 reps;Hand Hold: 1;Step Height: 4"   Functional Squat 15 reps   Rocker Board 2 minutes   SLS 5x     Knee/Hip Exercises: Seated   Sit to Sand 10 reps;without UE support     Knee/Hip Exercises: Supine   Quad Sets Right;1 set;15 reps   Heel Slides 10 reps   Knee Extension Limitations 5   Knee Flexion Limitations 103     Manual Therapy   Manual Therapy Edema management;Myofascial release   Manual therapy comments completed seperately from all other skilled interventions   Edema Management retro massage with BLE elevated to  decrease edema   Myofascial Release to adhesions along proximal scar and distal VMO                  PT Short Term Goals - 04/09/17 1043      PT SHORT TERM GOAL #1   Title PT pain in her right knee to be no greater than a 5/10 to allow pt to be able to walk with least assistive device x 20 minutes to be able to complete short shopping tasks.    Time 2   Period Weeks   Status On-going     PT SHORT TERM GOAL #2   Title PT right knee ROM to be to 95 degrees to allow pt to sit in comfort for 30 minutes to enjoy a meal.    Time 2   Period Weeks   Status On-going     PT SHORT TERM GOAL #3   Title Pt to be able to stand for 30 minutes in order to make a simple meal    Time 2   Period Weeks   Status On-going           PT Long Term Goals - 04/09/17 1044      PT LONG TERM GOAL #1   Title Pt pain to be no greater than a 2/10 to allow pt to walk without an assistive device for up to 40 minutes for longer shopping trips    Time 4   Period Weeks   Status On-going     PT LONG TERM GOAL #2   Title Pt right knee ROM to be to 110 degrees to allow pt to bend down and pick items off the floor with ease.    Time 4   Period Weeks   Status On-going     PT LONG TERM GOAL #3   Title Pt to be able to ascend and descend four steps in a reciprocal manner with a handrail without difficulty.    Time 4   Period Weeks   Status On-going     PT LONG TERM GOAL #4   Title PT single leg stance to be at least 30 seconds on both LE to allow pt to feel comfortable and confident walking over uneven terrain.   Time 4   Period Weeks   Status On-going               Plan - 04/24/17 1534    Clinical Impression Statement Session focus improving AROM and functional strengthening.  Pt continues to exhibit edema present proxima knee and myofascial restrictions near incision.  Began session wiht  manual techniques to address these restricitons.  Pt making slow gains with AROM 5-103 degrees.   Therex continue to focus on end range stretching and functional strenghtening with [facility]C activities.  No reports of increased pain through session.  Pt did c/o head cold this session, slight decrease in activity tolerance.     Rehab Potential Good   PT Frequency 3x / week   PT Duration 4 weeks   PT Treatment/Interventions ADLs/Self Care Home Management;Cryotherapy;Patient/family education;Manual techniques;Manual lymph drainage;Passive range of motion;Gait training;Stair training;Balance training;Electrical Stimulation   PT Next Visit Plan reassess prior MD apt on 6/26 next session   PT Home Exercise Plan to continue home health exercises.      Patient will benefit from skilled therapeutic intervention in order to improve the following deficits and impairments:  Decreased activity tolerance, Decreased balance, Decreased range of motion, Difficulty walking, Increased edema, Pain, Increased fascial restricitons  Visit Diagnosis: Stiffness of right knee, not elsewhere classified  Localized edema  Other abnormalities of gait and mobility     Problem List Patient Active Problem List   Diagnosis Date Noted  . Primary osteoarthritis of right knee 03/18/2017  . Dyslipidemia, goal LDL below 100 05/05/2016  . CKD (chronic kidney disease) 02/01/2016  . Vitamin D deficiency 01/16/2016  . GERD (gastroesophageal reflux disease) 08/19/2015  . Gout 08/19/2015  . Immunization refused 10/16/2014  . Medial meniscus, posterior horn derangement 02/02/2014  . Diabetes mellitus type 2 in obese ([place]) 06/25/2013  . Chronic pain of both knees 03/23/2013  . Patellar tendonitis 03/22/2013  . Patellar tendinitis of right knee 06/16/2012  . Essential hypertension 04/30/2011  . Obesity 02/10/2008   Ihor Austin, [place]; [place]  Aldona Lento 04/24/2017, 3:49 PM  [place] [address] [place], [place], [address] Phone: [phone]   Fax:   [phone]  Name: Kelli Spencer MRN: [id number] Date of Birth: 09/04/1966

## 2017-04-27 ENCOUNTER — Ambulatory Visit (HOSPITAL_COMMUNITY): Payer: Medicare Other | Admitting: Physical Therapy

## 2017-04-27 DIAGNOSIS — M25661 Stiffness of right knee, not elsewhere classified: Secondary | ICD-10-CM | POA: Diagnosis not present

## 2017-04-27 DIAGNOSIS — R600 Localized edema: Secondary | ICD-10-CM | POA: Diagnosis not present

## 2017-04-27 DIAGNOSIS — M25562 Pain in left knee: Secondary | ICD-10-CM | POA: Diagnosis not present

## 2017-04-27 DIAGNOSIS — G8929 Other chronic pain: Secondary | ICD-10-CM | POA: Diagnosis not present

## 2017-04-27 DIAGNOSIS — M25561 Pain in right knee: Secondary | ICD-10-CM | POA: Diagnosis not present

## 2017-04-27 DIAGNOSIS — R2689 Other abnormalities of gait and mobility: Secondary | ICD-10-CM

## 2017-04-27 NOTE — Therapy (Signed)
[place], [place], [address] Phone: [phone]   Fax:  [phone]  Physical Therapy Treatment/Discharge   Patient Details  Name: Kelli Spencer MRN: [id number] Date of Birth: 21-Feb-1966 Referring Provider: Arther Abbott   Encounter Date: 04/27/2017      PT End of Session - 04/27/17 1140    Visit Number 8   Number of Visits 8   Date for PT Re-Evaluation 05/07/17   Authorization Type [facility] medicare/ medicaid   Authorization - Visit Number 8   Authorization - Number of Visits 8   PT Start Time 8367   PT Stop Time 1150   PT Time Calculation (min) 35 min   Activity Tolerance Patient tolerated treatment well;No increased pain   Behavior During Therapy WFL for tasks assessed/performed      Past Medical History:  Diagnosis Date  . Arthritis   . Asthma   . Diabetes mellitus without complication ([place])   . Diastolic hypertension   . GERD (gastroesophageal reflux disease)   . Hyperlipidemia   . Hypertension   . Migraines   . Obesity   . Seizures ([place])    pt states she has seizures only when she gets upset. pt is taking no seizure meds    Past Surgical History:  Procedure Laterality Date  . ABDOMINAL HYSTERECTOMY  1997   fibroids  . COLONOSCOPY N/A 06/23/2016   Procedure: COLONOSCOPY;  Surgeon: Danie Binder, MD;  Location: [facility];  Service: Endoscopy;  Laterality: N/A;  11:30 AM  . KNEE ARTHROSCOPY WITH MEDIAL MENISECTOMY Left 02/09/2014   Procedure: KNEE ARTHROSCOPY WITH MEDIAL MENISECTOMY;  Surgeon: Carole Civil, MD;  Location: [facility] ORS;  Service: Orthopedics;  Laterality: Left;  . PARTIAL HYSTERECTOMY    . TOTAL KNEE ARTHROPLASTY Right 03/18/2017   Procedure: TOTAL KNEE ARTHROPLASTY;  Surgeon: Carole Civil, MD;  Location: [facility] ORS;  Service: Orthopedics;  Laterality: Right;    There were no vitals filed for this visit.      Subjective Assessment - 04/27/17 1123    Subjective Pt  states she is sick and feels weak but as far as her knee she gets around very good    How long can you sit comfortably? Able to sit as long as she wants was 10 minutes    How long can you stand comfortably? Able to stand for up to 30 mintues was 15  minutes with UE support    How long can you walk comfortably? Walking with no assistive device was using a walker    Patient Stated Goals walk without an assistive device;  walk longer, stand longer, to do steps    Currently in Pain? Yes   Pain Score 3    Pain Location Knee   Pain Orientation Right   Pain Descriptors / Indicators Aching   Pain Onset 1 to 4 weeks ago   Pain Frequency Intermittent   Aggravating Factors  activitiy             [facility] PT Assessment - 04/27/17 0001      Assessment   Medical Diagnosis Rt TKR   Referring Provider Arther Abbott    Onset Date/Surgical Date 03/18/17   Next MD Visit 05/07/2017   Prior Therapy acute     Precautions   Precautions None     Restrictions   Weight Bearing Restrictions No     Home Environment   Living Environment Private residence  Prior Function   Level of Independence Independent   Vocation Unemployed   Leisure cooking      Cognition   Overall Cognitive Status Within Functional Limits for tasks assessed     Observation/Other Assessments   Focus on Therapeutic Outcomes (FOTO)   81 was 38     Observation/Other Assessments-Edema    Edema Circumferential     Circumferential Edema   Circumferential - Right 49  no change    Circumferential - Left  43     Functional Tests   Functional tests Single leg stance;Sit to Stand     Single Leg Stance   Comments Rt 37 seconds was 20; Lt 38     Sit to Stand   Comments 5 x in 10.6 was 11.6     AROM   Right Knee Extension 3  was 8    Right Knee Flexion 107  was 80      Strength   Right Hip Flexion 5/5   Right Hip Extension 5/5   Right Hip ABduction 5/5  was 4+/5    Right Knee Flexion 5/5   Right Knee Extension  5/5  was 3/5    Right Ankle Dorsiflexion 5/5     Ambulation/Gait   Ambulation Distance (Feet) --   Assistive device --   Gait Pattern --   Gait Comments 3 minute walk test                              PT Education - 04/27/17 1150    Education provided Yes   Education Details The importance of continuing to get edema down; edema is chronic it may take months to work out.  If edema does not pt may want to consider coming back to therapy for Lymphedema treatment.    Person(s) Educated Patient   Methods Explanation   Comprehension Verbalized understanding          PT Short Term Goals - 04/27/17 1134      PT SHORT TERM GOAL #1   Title PT pain in her right knee to be no greater than a 5/10 to allow pt to be able to walk with least assistive device x 20 minutes to be able to complete short shopping tasks.    Baseline 04/27/2017:  Pt is able to walk without any assistive device for over 20 minutes but pain level still goes as high as an 8/10    Time 2   Period Weeks   Status Partially Met     PT SHORT TERM GOAL #2   Title PT right knee ROM to be to 95 degrees to allow pt to sit in comfort for 30 minutes to enjoy a meal.    Time 2   Period Weeks   Status Achieved     PT SHORT TERM GOAL #3   Title Pt to be able to stand for 30 minutes in order to make a simple meal    Time 2   Period Weeks   Status Achieved           PT Long Term Goals - 04/27/17 1135      PT LONG TERM GOAL #1   Title Pt pain to be no greater than a 2/10 to allow pt to walk without an assistive device for up to 40 minutes for longer shopping trips    Baseline  6/25 :Has walked for over an hour at [facility] but  again pain level is still high. See eval for base line    Time 4   Period Weeks   Status Partially Met     PT LONG TERM GOAL #2   Title Pt right knee ROM to be to 110 degrees to allow pt to bend down and pick items off the floor with ease.    Baseline able to pick items off  the floor with ease   Time 4   Period Weeks   Status Partially Met     PT LONG TERM GOAL #3   Title Pt to be able to ascend and descend four steps in a reciprocal manner with a handrail without difficulty.    Baseline sometimes she is able to other times she is not    Time 4   Period Weeks   Status Partially Met     PT LONG TERM GOAL #4   Title PT single leg stance to be at least 30 seconds on both LE to allow pt to feel comfortable and confident walking over uneven terrain.   Time 4   Period Weeks   Status Achieved               Plan - 05-06-2017 1141    Clinical Impression Statement Pt reassessed.  She has met or partially met all goals at this time.  She is happy with her functional level and is ready for discharge.    Rehab Potential Good   PT Frequency 3x / week   PT Duration 4 weeks   PT Treatment/Interventions ADLs/Self Care Home Management;Cryotherapy;Patient/family education;Manual techniques;Manual lymph drainage;Passive range of motion;Gait training;Stair training;Balance training;Electrical Stimulation   PT Next Visit Plan Discharge.    PT Home Exercise Plan to continue home health exercises.      Patient will benefit from skilled therapeutic intervention in order to improve the following deficits and impairments:  Decreased activity tolerance, Decreased balance, Decreased range of motion, Difficulty walking, Increased edema, Pain, Increased fascial restricitons  Visit Diagnosis: Stiffness of right knee, not elsewhere classified  Localized edema  Other abnormalities of gait and mobility       G-Codes - 05/06/2017 1152    Functional Limitation Mobility: Walking and moving around   Mobility: Walking and Moving Around Goal Status [phone]) At least 20 percent but less than 40 percent impaired, limited or restricted   Mobility: Walking and Moving Around Discharge Status [phone]) At least 20 percent but less than 40 percent impaired, limited or restricted       Problem List Patient Active Problem List   Diagnosis Date Noted  . Primary osteoarthritis of right knee 03/18/2017  . Dyslipidemia, goal LDL below 100 05/05/2016  . CKD (chronic kidney disease) 02/01/2016  . Vitamin D deficiency 01/16/2016  . GERD (gastroesophageal reflux disease) 08/19/2015  . Gout 08/19/2015  . Immunization refused 10/16/2014  . Medial meniscus, posterior horn derangement 02/02/2014  . Diabetes mellitus type 2 in obese ([place]) 06/25/2013  . Chronic pain of both knees 03/23/2013  . Patellar tendonitis 03/22/2013  . Patellar tendinitis of right knee 06/16/2012  . Essential hypertension 04/30/2011  . Obesity 02/10/2008  Rayetta Humphrey, PT CLT [phone] May 06, 2017, 11:52 AM  [place] [address] [place], [place], [address] Phone: [phone]   Fax:  [phone]  Name: Kelli Spencer MRN: [id number] Date of Birth: 25-Jul-1966  PHYSICAL THERAPY DISCHARGE SUMMARY  Visits from Start of Care: 8  Current functional level related to goals /  functional outcomes: As above    Remaining deficits: As above    Education / Equipment: HEP Plan: Patient agrees to discharge.  Patient goals were met. Patient is being discharged due to meeting the stated rehab goals.  ?????        Rayetta Humphrey, [place] CLT [phone]

## 2017-04-28 ENCOUNTER — Ambulatory Visit (INDEPENDENT_AMBULATORY_CARE_PROVIDER_SITE_OTHER): Payer: Self-pay | Admitting: Orthopedic Surgery

## 2017-04-28 ENCOUNTER — Ambulatory Visit (HOSPITAL_COMMUNITY)
Admission: RE | Admit: 2017-04-28 | Discharge: 2017-04-28 | Disposition: A | Discharge status: Home / Self Care | Payer: Medicare Other | Source: Ambulatory Visit | Attending: Family Medicine | Admitting: Family Medicine

## 2017-04-28 ENCOUNTER — Ambulatory Visit (INDEPENDENT_AMBULATORY_CARE_PROVIDER_SITE_OTHER): Payer: Medicare Other | Admitting: Family Medicine

## 2017-04-28 ENCOUNTER — Encounter: Payer: Self-pay | Admitting: Family Medicine

## 2017-04-28 ENCOUNTER — Encounter: Payer: Self-pay | Admitting: Orthopedic Surgery

## 2017-04-28 VITALS — BP 120/84 | HR 92 | Temp 99.3°F | Resp 17 | Ht 60.0 in | Wt 189.0 lb

## 2017-04-28 DIAGNOSIS — I10 Essential (primary) hypertension: Secondary | ICD-10-CM | POA: Diagnosis not present

## 2017-04-28 DIAGNOSIS — J209 Acute bronchitis, unspecified: Secondary | ICD-10-CM | POA: Insufficient documentation

## 2017-04-28 DIAGNOSIS — E1169 Type 2 diabetes mellitus with other specified complication: Secondary | ICD-10-CM | POA: Diagnosis not present

## 2017-04-28 DIAGNOSIS — Z4889 Encounter for other specified surgical aftercare: Secondary | ICD-10-CM

## 2017-04-28 DIAGNOSIS — Z96652 Presence of left artificial knee joint: Secondary | ICD-10-CM

## 2017-04-28 DIAGNOSIS — E669 Obesity, unspecified: Secondary | ICD-10-CM

## 2017-04-28 DIAGNOSIS — R0602 Shortness of breath: Secondary | ICD-10-CM | POA: Diagnosis not present

## 2017-04-28 DIAGNOSIS — R05 Cough: Secondary | ICD-10-CM | POA: Diagnosis not present

## 2017-04-28 LAB — CBC
HCT: 41 % (ref 35.0–45.0)
Hemoglobin: 13.1 g/dL (ref 11.7–15.5)
MCH: 28.7 pg (ref 27.0–33.0)
MCHC: 32 g/dL (ref 32.0–36.0)
MCV: 89.7 fL (ref 80.0–100.0)
MPV: 10.6 fL (ref 7.5–12.5)
Platelets: 425 10*3/uL — ABNORMAL HIGH (ref 140–400)
RBC: 4.57 MIL/uL (ref 3.80–5.10)
RDW: 13.8 % (ref 11.0–15.0)
WBC: 8.2 10*3/uL (ref 3.8–10.8)

## 2017-04-28 MED ORDER — PENICILLIN V POTASSIUM 500 MG PO TABS: 500.0000 mg | ORAL_TABLET | Freq: Three times a day (TID) | ORAL | 30 refills | Status: DC

## 2017-04-28 MED ORDER — BENZONATATE 100 MG PO CAPS: 100.0000 mg | ORAL_CAPSULE | Freq: Two times a day (BID) | ORAL | 0 refills | Status: DC | PRN

## 2017-04-28 NOTE — Patient Instructions (Addendum)
F/u as before, call if you need me sooner  CXR today and labs, will attempt to call in am with results  You are treated for acute  Bbronchitis. 2 medicatios are sent in Tylenol 1 every 6 hrs  As needed for pain and fever  Push fluids, get rest, take medcation as prescribed, if worsening need to go to ED  [facility] you feel better soon  Labs are cBC , cmp, hBA1C      Acute Bronchitis, Adult Acute bronchitis is when air tubes (bronchi) in the lungs suddenly get swollen. The condition can make it hard to breathe. It can also cause these symptoms:  A cough.  Coughing up clear, yellow, or green mucus.  Wheezing.  Chest congestion.  Shortness of breath.  A fever.  Body aches.  Chills.  A sore throat.  Follow these instructions at home: Medicines  Take over-the-counter and prescription medicines only as told by your doctor.  If you were prescribed an antibiotic medicine, take it as told by your doctor. Do not stop taking the antibiotic even if you start to feel better. General instructions  Rest.  Drink enough fluids to keep your pee (urine) clear or pale yellow.  Avoid smoking and secondhand smoke. If you smoke and you need help quitting, ask your doctor. Quitting will help your lungs heal faster.  Use an inhaler, cool mist vaporizer, or humidifier as told by your doctor.  Keep all follow-up visits as told by your doctor. This is important. How is this prevented? To lower your risk of getting this condition again:  Wash your hands often with soap and water. If you cannot use soap and water, use hand sanitizer.  Avoid contact with people who have cold symptoms.  Try not to touch your hands to your mouth, nose, or eyes.  Make sure to get the flu shot every year.  Contact a doctor if:  Your symptoms do not get better in 2 weeks. Get help right away if:  You cough up blood.  You have chest pain.  You have very bad shortness of breath.  You become  dehydrated.  You faint (pass out) or keep feeling like you are going to pass out.  You keep throwing up (vomiting).  You have a very bad headache.  Your fever or chills gets worse. This information is not intended to replace advice given to you by your health care provider. Make sure you discuss any questions you have with your health care provider. Document Released: 04/07/2008 Document Revised: 05/28/2016 Document Reviewed: 04/09/2016 Elsevier Interactive Patient Education  2017 [employer].

## 2017-04-28 NOTE — Progress Notes (Signed)
POST OP TKA   Chief Complaint  Patient presents with  . Follow-up    Recheck on right knee replacement, DOS 03-18-17.    POD#  41 DAYS   C/O SORENESS  SHE IS OFF OF THE CANE   HER ROM IS 3 - 110  INCISION IS CLEAN   CONTINUE PT FU 6 WEEKS

## 2017-04-29 ENCOUNTER — Ambulatory Visit (HOSPITAL_COMMUNITY): Payer: Medicare Other

## 2017-04-29 LAB — COMPREHENSIVE METABOLIC PANEL
ALBUMIN: 4.6 g/dL (ref 3.6–5.1)
ALT: 14 U/L (ref 6–29)
AST: 13 U/L (ref 10–35)
Alkaline Phosphatase: 110 U/L (ref 33–130)
BILIRUBIN TOTAL: 0.3 mg/dL (ref 0.2–1.2)
BUN: 24 mg/dL (ref 7–25)
CO2: 23 mmol/L (ref 20–31)
CREATININE: 1.4 mg/dL — AB (ref 0.50–1.05)
Calcium: 10 mg/dL (ref 8.6–10.4)
Chloride: 102 mmol/L (ref 98–110)
Glucose, Bld: 206 mg/dL — ABNORMAL HIGH (ref 65–99)
Potassium: 4.7 mmol/L (ref 3.5–5.3)
SODIUM: 138 mmol/L (ref 135–146)
TOTAL PROTEIN: 8.4 g/dL — AB (ref 6.1–8.1)

## 2017-04-29 LAB — HEMOGLOBIN A1C
HEMOGLOBIN A1C: 6.5 % — AB (ref <5.7)
MEAN PLASMA GLUCOSE: 140 mg/dL

## 2017-05-01 ENCOUNTER — Encounter (HOSPITAL_COMMUNITY): Payer: Medicare Other

## 2017-05-03 ENCOUNTER — Encounter: Payer: Self-pay | Admitting: Family Medicine

## 2017-05-03 NOTE — Assessment & Plan Note (Signed)
Controlled, no change in medication DASH diet and commitment to daily physical activity for a minimum of 30 minutes discussed and encouraged, as a part of hypertension management. The importance of attaining a healthy weight is also discussed.  BP/Weight 04/28/2017 03/20/2017 03/18/2017 03/13/2017 02/23/2017 02/04/101 05/04/5365  Systolic BP 440 347 - 425 - 956 -  Diastolic BP 84 71 - 83 - 80 -  Wt. (Lbs) 189 - 198 198 198 197.08 194  BMI 36.91 - 38.67 38.67 38.67 38.49 37.89

## 2017-05-03 NOTE — Assessment & Plan Note (Signed)
2 week h/o cough fever and chills , worsening fatigue and poor papetite, cXR and antibiotic with decongestant also labs to assess hydration and cBC

## 2017-05-03 NOTE — Assessment & Plan Note (Signed)
Kelli Spencer is reminded of the importance of commitment to daily physical activity for 30 minutes or more, as able and the need to limit carbohydrate intake to 30 to 60 grams per meal to help with blood sugar control.   The need to take medication as prescribed, test blood sugar as directed, and to call between visits if there is a concern that blood sugar is uncontrolled is also discussed.   Kelli Spencer is reminded of the importance of daily foot exam, annual eye examination, and good blood sugar, blood pressure and cholesterol control.  Controlled, no change in medication  Diabetic Labs Latest Ref Rng & Units 04/28/2017 03/19/2017 03/13/2017 12/04/2016 07/21/2016  HbA1c <5.7 % 6.5(H) - - 7.0(H) 6.4(H)  Microalbumin Not Estab. ug/mL - - - - -  Micro/Creat Ratio 0.0 - 30.0 mg/g creat - - - - -  Chol <200 mg/dL - - - 138 121(L)  HDL >50 mg/dL - - - 43(L) 42(L)  Calc LDL <100 mg/dL - - - 85 69  Triglycerides <150 mg/dL - - - 50 50  Creatinine 0.50 - 1.05 mg/dL 1.40(H) 1.15(H) 1.33(H) 1.32(H) 1.11(H)   BP/Weight 04/28/2017 03/20/2017 03/18/2017 03/13/2017 02/23/2017 0/0/8676 11/12/5091  Systolic BP 267 124 - 580 - 998 -  Diastolic BP 84 71 - 83 - 80 -  Wt. (Lbs) 189 - 198 198 198 197.08 194  BMI 36.91 - 38.67 38.67 38.67 38.49 37.89   Foot/eye exam completion dates 08/21/2016 08/15/2015  Foot Form Completion Done Done

## 2017-05-03 NOTE — Progress Notes (Signed)
Kelli Spencer     MRN: [id number]      DOB: 1966-04-29   HPI Kelli Spencer is here with a 2 week h/o cough fever, chills and poor appetite as well as worsening fatigue The PT denies any adverse reactions to current medications since the last visit.  She denies urinary symptoms, she denies nausea, vomit or loose stool. ROS  Denies sinus pressure, nasal congestion, ear pain or sore throat.  Denies chest pains, palpitations and leg swelling Denies abdominal pain, nausea, vomiting,diarrhea or constipation.   Denies dysuria, frequency, hesitancy or incontinence. Denies joint pain, swelling and limitation in mobility. Denies headaches, seizures, numbness, or tingling. Denies depression, anxiety or insomnia. Denies skin break down or rash.   PE  BP 120/84   Pulse 92   Temp 99.3 F (37.4 C) (Oral)   Resp 17   Ht 5' (1.524 m)   Wt 189 lb (85.7 kg)   LMP 06/10/1988 (Approximate)   SpO2 97%   BMI 36.91 kg/m   Patient alert and oriented and in no cardiopulmonary distress.  HEENT: No facial asymmetry, EOMI,   oropharynx pink and moist.  Neck supple no JVD, no mass.  Chest: Adequate air entry , bilateral crackles, no wheezes. CVS: S1, S2 no murmurs, no S3.Regular rate.  ABD: Soft no localized tenderness or guarding, normal bS  Ext: No edema  MS: Adequate ROM spine, shoulders, hips and knees.  Skin: Intact, no ulcerations or rash noted.  Psych: Good eye contact, normal affect. Memory intact not anxious or depressed appearing.  CNS: CN 2-12 intact, power,  normal throughout.no focal deficits noted.   Assessment & Plan Acute bronchitis 2 week h/o cough fever and chills , worsening fatigue and poor papetite, cXR and antibiotic with decongestant also labs to assess hydration and cBC  Diabetes mellitus type 2 in obese [facility]) Kelli Spencer is reminded of the importance of commitment to daily physical activity for 30 minutes or more, as able and the need to limit carbohydrate intake  to 30 to 60 grams per meal to help with blood sugar control.   The need to take medication as prescribed, test blood sugar as directed, and to call between visits if there is a concern that blood sugar is uncontrolled is also discussed.   Kelli Spencer is reminded of the importance of daily foot exam, annual eye examination, and good blood sugar, blood pressure and cholesterol control.  Controlled, no change in medication  Diabetic Labs Latest Ref Rng & Units 04/28/2017 03/19/2017 03/13/2017 12/04/2016 07/21/2016  HbA1c <5.7 % 6.5(H) - - 7.0(H) 6.4(H)  Microalbumin Not Estab. ug/mL - - - - -  Micro/Creat Ratio 0.0 - 30.0 mg/g creat - - - - -  Chol <200 mg/dL - - - 138 121(L)  HDL >50 mg/dL - - - 43(L) 42(L)  Calc LDL <100 mg/dL - - - 85 69  Triglycerides <150 mg/dL - - - 50 50  Creatinine 0.50 - 1.05 mg/dL 1.40(H) 1.15(H) 1.33(H) 1.32(H) 1.11(H)   BP/Weight 04/28/2017 03/20/2017 03/18/2017 03/13/2017 02/23/2017 02/05/8098 06/05/3824  Systolic BP 053 976 - 734 - 193 -  Diastolic BP 84 71 - 83 - 80 -  Wt. (Lbs) 189 - 198 198 198 197.08 194  BMI 36.91 - 38.67 38.67 38.67 38.49 37.89   Foot/eye exam completion dates 08/21/2016 08/15/2015  Foot Form Completion Done Done        Essential hypertension Controlled, no change in medication DASH diet and commitment to daily physical  activity for a minimum of 30 minutes discussed and encouraged, as a part of hypertension management. The importance of attaining a healthy weight is also discussed.  BP/Weight 04/28/2017 03/20/2017 03/18/2017 03/13/2017 02/23/2017 12/06/3610 12/07/4973  Systolic BP 300 511 - 021 - 117 -  Diastolic BP 84 71 - 83 - 80 -  Wt. (Lbs) 189 - 198 198 198 197.08 194  BMI 36.91 - 38.67 38.67 38.67 38.49 37.89

## 2017-05-04 ENCOUNTER — Encounter (HOSPITAL_COMMUNITY): Payer: Medicare Other | Admitting: Physical Therapy

## 2017-05-07 ENCOUNTER — Encounter (HOSPITAL_COMMUNITY): Payer: Medicare Other | Admitting: Physical Therapy

## 2017-05-11 ENCOUNTER — Encounter (HOSPITAL_COMMUNITY): Payer: Medicare Other | Admitting: Physical Therapy

## 2017-05-12 ENCOUNTER — Encounter: Payer: Medicare Other | Admitting: Family Medicine

## 2017-05-13 ENCOUNTER — Encounter (HOSPITAL_COMMUNITY): Payer: Medicare Other | Admitting: Physical Therapy

## 2017-05-15 ENCOUNTER — Encounter (HOSPITAL_COMMUNITY): Payer: Medicare Other | Admitting: Physical Therapy

## 2017-05-27 ENCOUNTER — Other Ambulatory Visit (HOSPITAL_COMMUNITY)
Admission: RE | Admit: 2017-05-27 | Discharge: 2017-05-27 | Disposition: A | Discharge status: Home / Self Care | Payer: Medicare Other | Source: Ambulatory Visit | Attending: Family Medicine | Admitting: Family Medicine

## 2017-05-27 ENCOUNTER — Ambulatory Visit (INDEPENDENT_AMBULATORY_CARE_PROVIDER_SITE_OTHER): Payer: Medicare Other | Admitting: Family Medicine

## 2017-05-27 ENCOUNTER — Encounter: Payer: Self-pay | Admitting: Family Medicine

## 2017-05-27 VITALS — BP 116/78 | HR 52 | Temp 98.0°F | Resp 16 | Ht 60.0 in | Wt 190.5 lb

## 2017-05-27 DIAGNOSIS — Z01419 Encounter for gynecological examination (general) (routine) without abnormal findings: Secondary | ICD-10-CM | POA: Diagnosis not present

## 2017-05-27 DIAGNOSIS — N189 Chronic kidney disease, unspecified: Secondary | ICD-10-CM | POA: Insufficient documentation

## 2017-05-27 DIAGNOSIS — E669 Obesity, unspecified: Secondary | ICD-10-CM | POA: Insufficient documentation

## 2017-05-27 DIAGNOSIS — Z0000 Encounter for general adult medical examination without abnormal findings: Secondary | ICD-10-CM | POA: Diagnosis not present

## 2017-05-27 DIAGNOSIS — E785 Hyperlipidemia, unspecified: Secondary | ICD-10-CM | POA: Insufficient documentation

## 2017-05-27 DIAGNOSIS — Z1211 Encounter for screening for malignant neoplasm of colon: Secondary | ICD-10-CM

## 2017-05-27 DIAGNOSIS — E1169 Type 2 diabetes mellitus with other specified complication: Secondary | ICD-10-CM

## 2017-05-27 LAB — HEMOCCULT GUIAC POC 1CARD (OFFICE): FECAL OCCULT BLD: NEGATIVE

## 2017-05-27 NOTE — Progress Notes (Signed)
Kelli Spencer     MRN: [id number]      DOB: 1965/11/19  HPI: Patient is in for annual physical exam. No other health concerns are expressed or addressed at the visit. Recent labs, if available are reviewed. Immunization is reviewed , and  updated if needed.   PE: Pleasant  female, alert and oriented x 3, in no cardio-pulmonary distress. Afebrile. HEENT No facial trauma or asymetry. Sinuses non tender.  Extra occullar muscles intact, pupils equally reactive to light. External ears normal, tympanic membranes clear. Oropharynx moist, no exudate. Neck: supple, no adenopathy,JVD or thyromegaly.No bruits.  Chest: Clear to ascultation bilaterally.No crackles or wheezes. Non tender to palpation  Breast: No asymetry,no masses or lumps. No tenderness. No nipple discharge or inversion. No axillary or supraclavicular adenopathy  Cardiovascular system; Heart sounds normal,  S1 and  S2 ,no S3.  No murmur, or thrill. Apical beat not displaced Peripheral pulses normal.  Abdomen: Soft, non tender, no organomegaly or masses. No bruits. Bowel sounds normal. No guarding, tenderness or rebound.  Rectal:  Normal sphincter tone. No rectal mass. Guaiac negative stool.  GU: External genitalia normal female genitalia , normal female distribution of hair. No lesions. Urethral meatus normal in size, no  Prolapse, no lesions visibly  Present. Bladder non tender. Vagina pink and moist , with no visible lesions ,physiologic  discharge present . Adequate pelvic support no  cystocele or rectocele noted  Uterus absent, no adnexal masses, no  adnexal tenderness.   Musculoskeletal exam: Full ROM of spine, hips , shoulders and knees. No deformity ,swelling or crepitus noted. No muscle wasting or atrophy.   Neurologic: Cranial nerves 2 to 12 intact. Power, tone ,sensation and reflexes normal throughout. No disturbance in gait. No tremor.  Skin: Intact, no ulceration, erythema ,  scaling or rash noted.Well healed surgical scar right knee Pigmentation normal throughout  Psych; Normal mood and affect. Judgement and concentration normal   Assessment & Plan:  Annual physical exam Annual exam as documented.  Immunization and cancer screening needs are specifically addressed at this visit.   Diabetes mellitus type 2 in obese ([facility]) Controlled, no change in medication Kelli Spencer is reminded of the importance of commitment to daily physical activity for 30 minutes or more, as able and the need to limit carbohydrate intake to 30 to 60 grams per meal to help with blood sugar control.   The need to take medication as prescribed, test blood sugar as directed, and to call between visits if there is a concern that blood sugar is uncontrolled is also discussed.   Kelli Spencer is reminded of the importance of daily foot exam, annual eye examination, and good blood sugar, blood pressure and cholesterol control.  Diabetic Labs Latest Ref Rng & Units 04/28/2017 03/19/2017 03/13/2017 12/04/2016 07/21/2016  HbA1c <5.7 % 6.5(H) - - 7.0(H) 6.4(H)  Microalbumin Not Estab. ug/mL - - - - -  Micro/Creat Ratio 0.0 - 30.0 mg/g creat - - - - -  Chol <200 mg/dL - - - 138 121(L)  HDL >50 mg/dL - - - 43(L) 42(L)  Calc LDL <100 mg/dL - - - 85 69  Triglycerides <150 mg/dL - - - 50 50  Creatinine 0.50 - 1.05 mg/dL 1.40(H) 1.15(H) 1.33(H) 1.32(H) 1.11(H)   BP/Weight 05/27/2017 04/28/2017 03/20/2017 03/18/2017 03/13/2017 11/08/2692 06/07/4626  Systolic BP 035 009 381 - 829 - 937  Diastolic BP 78 84 71 - 83 - 80  Wt. (Lbs) 190.5 189 - 198 198  198 197.08  BMI 37.2 36.91 - 38.67 38.67 38.67 38.49   Foot/eye exam completion dates 08/21/2016 08/15/2015  Foot Form Completion Done Done

## 2017-05-27 NOTE — Assessment & Plan Note (Signed)
Heme negative stool, no palpable rectal mass

## 2017-05-27 NOTE — Assessment & Plan Note (Signed)
Annual exam as documented. . Immunization and cancer screening needs are specifically addressed at this visit.  

## 2017-05-27 NOTE — Assessment & Plan Note (Signed)
Controlled, no change in medication Kelli Spencer is reminded of the importance of commitment to daily physical activity for 30 minutes or more, as able and the need to limit carbohydrate intake to 30 to 60 grams per meal to help with blood sugar control.   The need to take medication as prescribed, test blood sugar as directed, and to call between visits if there is a concern that blood sugar is uncontrolled is also discussed.   Kelli Spencer is reminded of the importance of daily foot exam, annual eye examination, and good blood sugar, blood pressure and cholesterol control.  Diabetic Labs Latest Ref Rng & Units 04/28/2017 03/19/2017 03/13/2017 12/04/2016 07/21/2016  HbA1c <5.7 % 6.5(H) - - 7.0(H) 6.4(H)  Microalbumin Not Estab. ug/mL - - - - -  Micro/Creat Ratio 0.0 - 30.0 mg/g creat - - - - -  Chol <200 mg/dL - - - 138 121(L)  HDL >50 mg/dL - - - 43(L) 42(L)  Calc LDL <100 mg/dL - - - 85 69  Triglycerides <150 mg/dL - - - 50 50  Creatinine 0.50 - 1.05 mg/dL 1.40(H) 1.15(H) 1.33(H) 1.32(H) 1.11(H)   BP/Weight 05/27/2017 04/28/2017 03/20/2017 03/18/2017 03/13/2017 6/38/7564 01/03/2950  Systolic BP 884 166 063 - 016 - 010  Diastolic BP 78 84 71 - 83 - 80  Wt. (Lbs) 190.5 189 - 198 198 198 197.08  BMI 37.2 36.91 - 38.67 38.67 38.67 38.49   Foot/eye exam completion dates 08/21/2016 08/15/2015  Foot Form Completion Done Done

## 2017-05-27 NOTE — Patient Instructions (Addendum)
F/u in 4 month, call; if you need me before  Fasting lipid, cmp and EGFr, hBA1C  1 week before next visit  You are referred for diabetic eye exam to Dr Arlyss Repress from office today  Pap being sent today also  It is important that you exercise regularly at least 30 minutes 5 times a week. If you develop chest pain, have severe difficulty breathing, or feel very tired, stop exercising immediately and seek medical attention  Please work on good  health habits so that your health will improve. 1. Commitment to daily physical activity for 30 to 60  minutes, if you are able to do this.  2. Commitment to wise food choices. Aim for half of your  food intake to be vegetable and fruit, one quarter starchy foods, and one quarter protein. Try to eat on a regular schedule  3 meals per day, snacking between meals should be limited to vegetables or fruits or small portions of nuts. 64 ounces of water per day is generally recommended, unless you have specific health conditions, like heart failure or kidney failure where you will need to limit fluid intake.  3. Commitment to sufficient and a  good quality of physical and mental rest daily, generally between 6 to 8 hours per day.  WITH PERSISTANCE AND PERSEVERANCE, THE IMPOSSIBLE , BECOMES THE NORM!  Thankful surgery on left knee went well

## 2017-05-28 ENCOUNTER — Other Ambulatory Visit (HOSPITAL_COMMUNITY)
Admission: RE | Admit: 2017-05-28 | Discharge: 2017-05-28 | Disposition: A | Discharge status: Home / Self Care | Payer: Medicare Other | Source: Other Acute Inpatient Hospital | Attending: Family Medicine | Admitting: Family Medicine

## 2017-05-28 DIAGNOSIS — Z0000 Encounter for general adult medical examination without abnormal findings: Secondary | ICD-10-CM | POA: Insufficient documentation

## 2017-05-29 LAB — MICROALBUMIN / CREATININE URINE RATIO
Creatinine, Urine: 151 mg/dL
Microalb Creat Ratio: <2 mg/g creat (ref 0.0–30.0)
Microalb, Ur: <3 ug/mL — ABNORMAL HIGH

## 2017-06-02 ENCOUNTER — Telehealth: Payer: Self-pay | Admitting: Family Medicine

## 2017-06-02 ENCOUNTER — Other Ambulatory Visit: Payer: Self-pay | Admitting: Family Medicine

## 2017-06-02 LAB — CYTOLOGY - PAP
DIAGNOSIS: NEGATIVE
HPV: NOT DETECTED

## 2017-06-02 MED ORDER — FLUCONAZOLE 150 MG PO TABS: 150.0000 mg | ORAL_TABLET | Freq: Once | ORAL | 0 refills | Status: AC

## 2017-06-02 NOTE — Telephone Encounter (Signed)
-----   Message from Fayrene Helper, MD sent at 06/02/2017 12:37 PM EDT ----- Please advise the patient that pap is normal. She has fungal infection and I have entered one tablet to treat this , fluconazole, please send after you speak with her

## 2017-06-02 NOTE — Telephone Encounter (Signed)
Patient informed of message below, verbalized understanding.  

## 2017-06-08 ENCOUNTER — Ambulatory Visit: Payer: Medicare Other | Admitting: Nutrition

## 2017-06-08 ENCOUNTER — Other Ambulatory Visit: Payer: Self-pay | Admitting: Family Medicine

## 2017-06-08 ENCOUNTER — Other Ambulatory Visit: Payer: Self-pay

## 2017-06-08 DIAGNOSIS — E1169 Type 2 diabetes mellitus with other specified complication: Secondary | ICD-10-CM

## 2017-06-08 DIAGNOSIS — E669 Obesity, unspecified: Principal | ICD-10-CM

## 2017-06-08 NOTE — Progress Notes (Signed)
Referral entered for DM ed

## 2017-06-09 ENCOUNTER — Ambulatory Visit (INDEPENDENT_AMBULATORY_CARE_PROVIDER_SITE_OTHER): Payer: Medicare Other | Admitting: Orthopedic Surgery

## 2017-06-09 DIAGNOSIS — Z96651 Presence of right artificial knee joint: Secondary | ICD-10-CM

## 2017-06-09 DIAGNOSIS — Z4889 Encounter for other specified surgical aftercare: Secondary | ICD-10-CM

## 2017-06-09 MED ORDER — IBUPROFEN 400 MG PO TABS: ORAL_TABLET | ORAL | 0 refills | Status: DC

## 2017-06-09 MED ORDER — SENNA 8.6 MG PO TABS: 1.0000 | ORAL_TABLET | Freq: Two times a day (BID) | ORAL | 5 refills | Status: DC

## 2017-06-09 MED ORDER — HYDROCODONE-ACETAMINOPHEN 7.5-325 MG PO TABS: 1.0000 | ORAL_TABLET | Freq: Three times a day (TID) | ORAL | 0 refills | Status: DC | PRN

## 2017-06-09 NOTE — Progress Notes (Signed)
This is a routine postop visit  Date of surgery 03/18/2017 right total knee  Patient complains of mild medial knee pain.  She complains of some constipation on hydrocodone. She has decreased her pain medication to once or twice a day  We also took her ibuprofen and split it into 400 mg tablets.  The knee looks good her flexion is 115 her knee is stable  She'll come back in 3 months  Encounter Diagnoses  Name Primary?  Kelli Spencer Aftercare following surgery Yes  . Status post total right knee replacement      Meds ordered this encounter  Medications  . ibuprofen (ADVIL,MOTRIN) 400 MG tablet    Sig: Take 2 tablets every 8 hrs as needed    Dispense:  90 tablet    Refill:  0  . HYDROcodone-acetaminophen (NORCO) 7.5-325 MG tablet    Sig: Take 1 tablet by mouth every 8 (eight) hours as needed for moderate pain.    Dispense:  21 tablet    Refill:  0  . senna (SENOKOT) 8.6 MG TABS tablet    Sig: Take 1 tablet (8.6 mg total) by mouth 2 (two) times daily.    Dispense:  60 each    Refill:  5

## 2017-06-24 ENCOUNTER — Other Ambulatory Visit: Payer: Self-pay | Admitting: Family Medicine

## 2017-07-01 ENCOUNTER — Encounter: Payer: Medicare Other | Attending: Family Medicine | Admitting: Nutrition

## 2017-07-01 VITALS — Ht 60.0 in | Wt 195.0 lb

## 2017-07-01 DIAGNOSIS — IMO0002 Reserved for concepts with insufficient information to code with codable children: Secondary | ICD-10-CM

## 2017-07-01 DIAGNOSIS — Z713 Dietary counseling and surveillance: Secondary | ICD-10-CM | POA: Diagnosis not present

## 2017-07-01 DIAGNOSIS — E669 Obesity, unspecified: Secondary | ICD-10-CM | POA: Diagnosis not present

## 2017-07-01 DIAGNOSIS — E118 Type 2 diabetes mellitus with unspecified complications: Secondary | ICD-10-CM

## 2017-07-01 DIAGNOSIS — E1169 Type 2 diabetes mellitus with other specified complication: Secondary | ICD-10-CM | POA: Diagnosis not present

## 2017-07-01 DIAGNOSIS — E1165 Type 2 diabetes mellitus with hyperglycemia: Secondary | ICD-10-CM

## 2017-07-01 NOTE — Progress Notes (Signed)
  Medical Nutrition Therapy:  Appt start time: 1000 end time:  1030.   Assessment:  Primary concerns today: Follow up..  A1c 6.5%.  Feels much better. Has been taking off Januvia and only on Trajenta, Glipizide.  Changes:  Now drinking only water.  Cutting down on portions. Working on increasing exercise. Gained 5 lbs. Lab Results  Component Value Date   HGBA1C 6.5 (H) 04/28/2017   CMP Latest Ref Rng & Units 04/28/2017 03/19/2017 03/13/2017  Glucose 65 - 99 mg/dL 206(H) 189(H) 125(H)  BUN 7 - 25 mg/dL 24 15 22(H)  Creatinine 0.50 - 1.05 mg/dL 1.40(H) 1.15(H) 1.33(H)  Sodium 135 - 146 mmol/L 138 136 141  Potassium 3.5 - 5.3 mmol/L 4.7 4.4 4.0  Chloride 98 - 110 mmol/L 102 103 105  CO2 20 - 31 mmol/L 23 26 26   Calcium 8.6 - 10.4 mg/dL 10.0 8.7(L) 9.8  Total Protein 6.1 - 8.1 g/dL 8.4(H) - -  Total Bilirubin 0.2 - 1.2 mg/dL 0.3 - -  Alkaline Phos 33 - 130 U/L 110 - -  AST 10 - 35 U/L 13 - -  ALT 6 - 29 U/L 14 - -   Lipid Panel     Component Value Date/Time   CHOL 138 12/04/2016 0943   TRIG 50 12/04/2016 0943   HDL 43 (L) 12/04/2016 0943   CHOLHDL 3.2 12/04/2016 0943   VLDL 10 12/04/2016 0943   LDLCALC 85 12/04/2016 0943       Preferred Learning Style:   No preference indicated   Learning Readiness:     Ready  Change in progress   MEDICATIONS:    DIETARY INTAKE:   24-hr recall:  B ( AM):Oatmeal or  cheerios with milk,  Or 2 eggs and fruit Snk ( AM): L ( PM): skipped Snk ( PM):  D ( PM): Pork chop, hamburger casserole, water  Snk ( PM): Beverages: water  Usual physical activity: ADL  Estimated energy needs: 1200  calories 135 g carbohydrates 90 g protein 33 g fat  Progress Towards Goal(s):  In progress.   Nutritional Diagnosis:  NB-1.1 Food and nutrition-related knowledge deficit As related to DIabetes.  As evidenced by A1C 6.5%.    Intervention:  Nutrition and Diabetes education provided on My Plate, CHO counting, meal planning, portion sizes,  timing of meals, avoiding snacks between meals unless having a low blood sugar, target ranges for A1C and blood sugars, signs/symptoms and treatment of hyper/hypoglycemia, monitoring blood sugars, taking medications as prescribed, benefits of exercising 30 minutes per day and prevention of complications of DM. Marland Kitchen Goals 1. Keep up the good work 2. Don't skip meals 3. Increase fresh vegetables and fruit 4. Cut down on fried foods 5. Walk 15 minutes  6. Lose 3 lbs per month Keep A1C 6.5% or less.   Teaching Method Utilized:  Visual Auditory Hands on  Handouts given during visit include:  The Plate Method  Meal Plan Card   Barriers to learning/adherence to lifestyle change: none  Demonstrated degree of understanding via:  Teach Back   Monitoring/Evaluation:  Dietary intake, exercise, meal planning, and body weight in 3 month(s).

## 2017-07-01 NOTE — Patient Instructions (Addendum)
Goals 1. Keep up the good work 2. Don't skip meals 3. Increase fresh vegetables and fruit 4. Cut down on fried foods 5. Walk 15 minutes  6. Lose 3 lbs per month Keep A1C 6.5% or less.

## 2017-08-08 IMAGING — MG DIGITAL SCREENING BILATERAL MAMMOGRAM WITH CAD
5 series · 5 of 5 positions shown · non-contrast
Comparison: Previous exam(s).

CLINICAL DATA: Screening.

EXAM:
DIGITAL SCREENING BILATERAL MAMMOGRAM WITH CAD

[R CC]
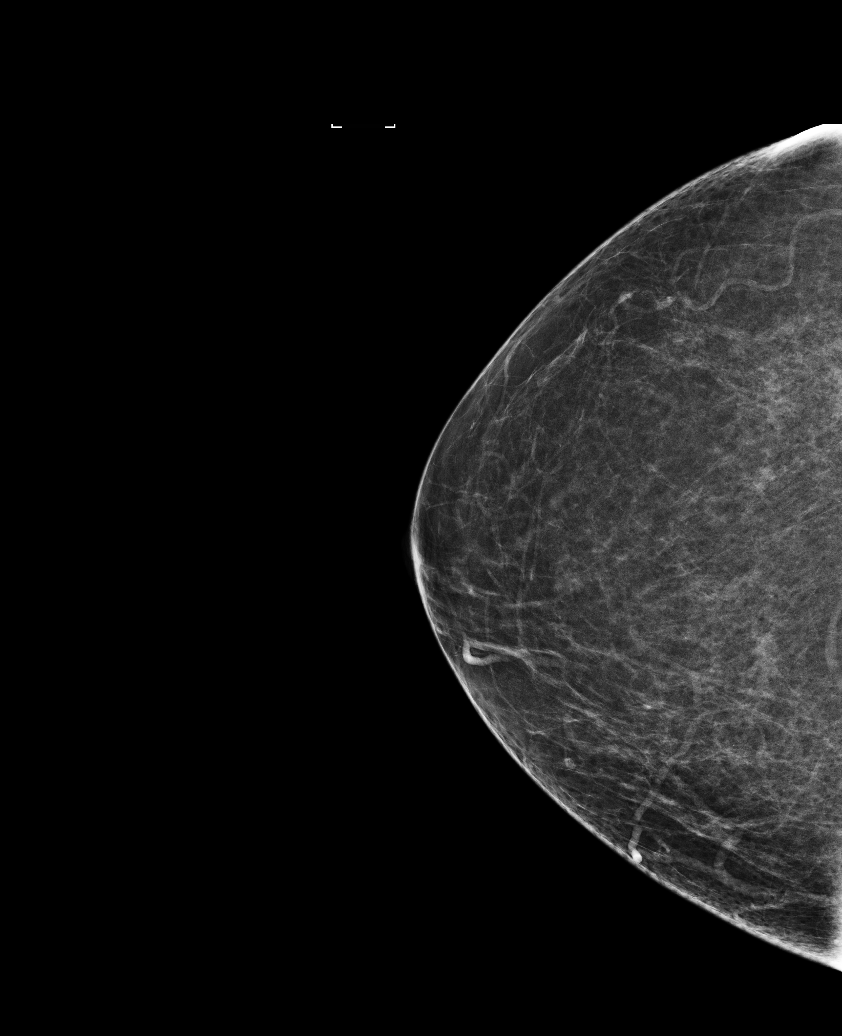

[R MLO (1 of 2)]
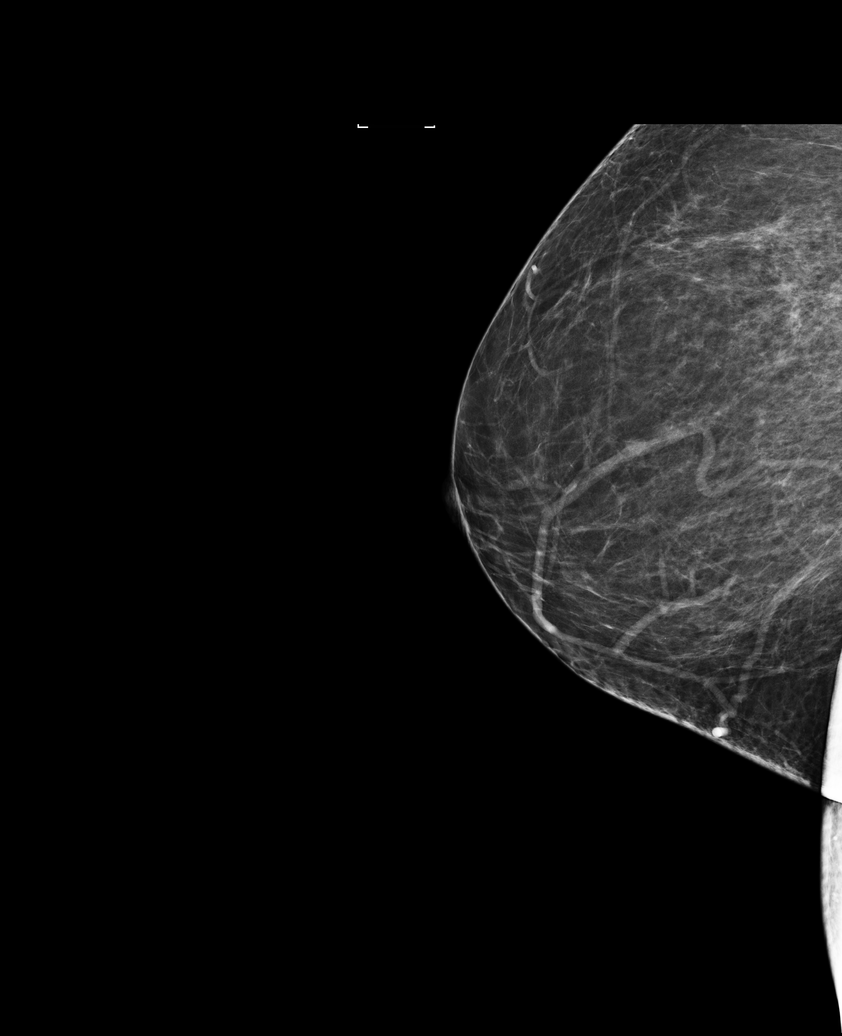

[R MLO (2 of 2)]
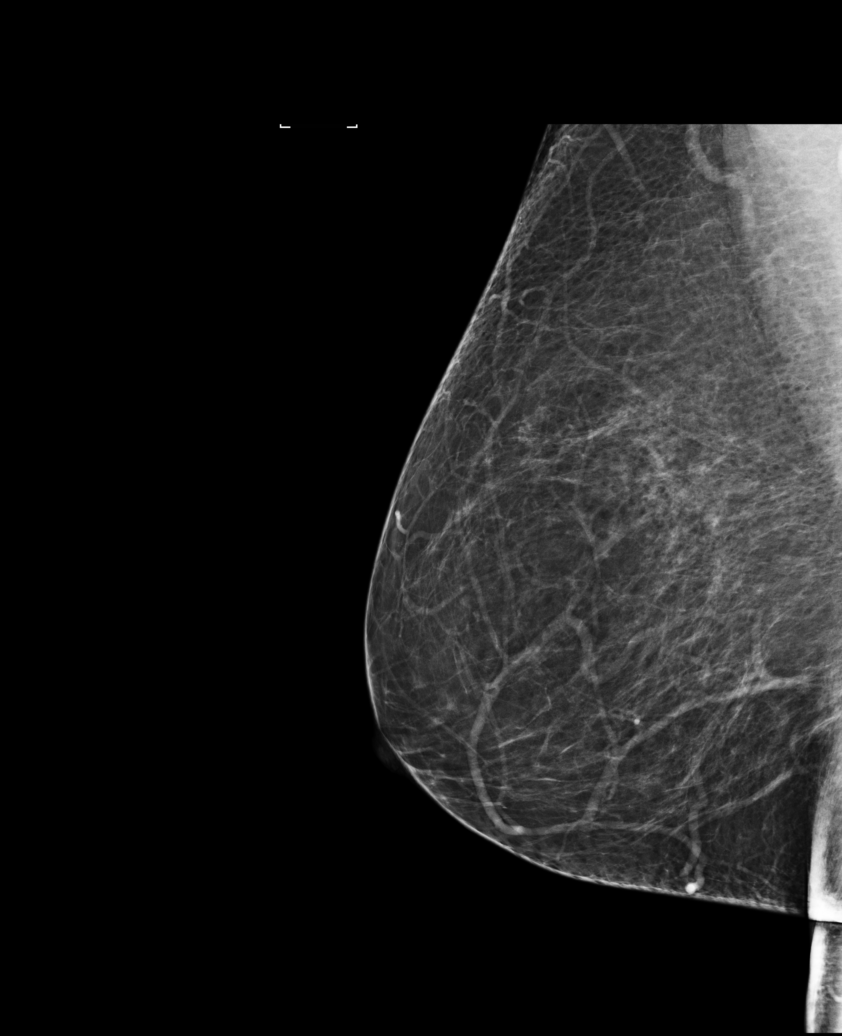

[L CC]
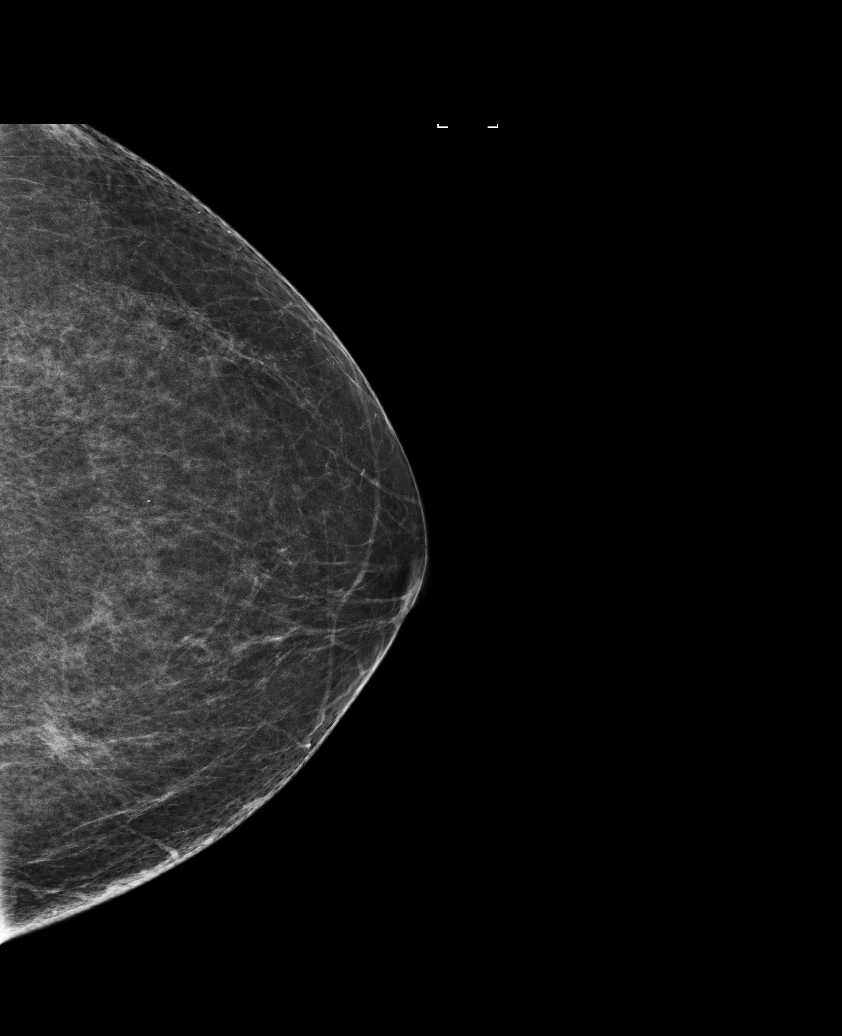

[L MLO]
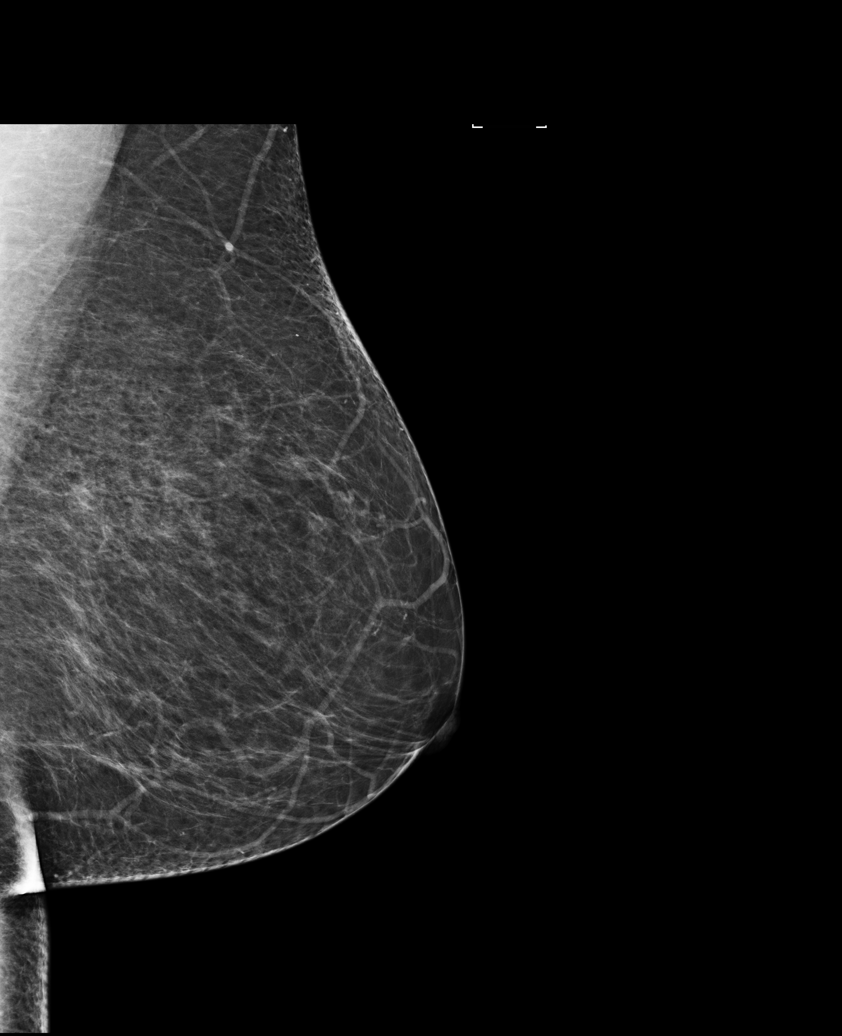

[5 of 5 positions shown; findings below may reference images not displayed]

ACR Breast Density Category b: There are scattered areas of
fibroglandular density.
FINDINGS: There are no findings suspicious for malignancy. Images were
processed with CAD.
IMPRESSION: No mammographic evidence of malignancy. A result letter of this
screening mammogram will be mailed directly to the patient.

RECOMMENDATION:
Screening mammogram in one year. (Code:AS-G-LCT)

BI-RADS CATEGORY  1: Negative.

## 2017-08-30 ENCOUNTER — Other Ambulatory Visit: Payer: Self-pay | Admitting: Family Medicine

## 2017-08-30 DIAGNOSIS — E785 Hyperlipidemia, unspecified: Secondary | ICD-10-CM

## 2017-08-30 DIAGNOSIS — E669 Obesity, unspecified: Secondary | ICD-10-CM

## 2017-08-30 DIAGNOSIS — E1169 Type 2 diabetes mellitus with other specified complication: Secondary | ICD-10-CM

## 2017-09-01 ENCOUNTER — Other Ambulatory Visit: Payer: Self-pay | Admitting: Family Medicine

## 2017-09-09 ENCOUNTER — Ambulatory Visit (INDEPENDENT_AMBULATORY_CARE_PROVIDER_SITE_OTHER): Payer: Medicare Other | Admitting: Orthopedic Surgery

## 2017-09-09 VITALS — BP 116/78 | HR 86 | Ht 60.0 in | Wt 190.0 lb

## 2017-09-09 DIAGNOSIS — Z96651 Presence of right artificial knee joint: Secondary | ICD-10-CM

## 2017-09-09 NOTE — Progress Notes (Signed)
Chief Complaint  Patient presents with  . Follow-up    Recheck on right TKA, DOS 03-18-17.   6 months after right total knee.  Patient is doing well having some mild discomfort in her left knee.  She is walking without her cane she has excellent flexion and extension with good strength  She will follow-up for her annual x-ray May 2019

## 2017-09-22 DIAGNOSIS — N189 Chronic kidney disease, unspecified: Secondary | ICD-10-CM | POA: Diagnosis not present

## 2017-09-22 DIAGNOSIS — E785 Hyperlipidemia, unspecified: Secondary | ICD-10-CM | POA: Diagnosis not present

## 2017-09-22 DIAGNOSIS — E1169 Type 2 diabetes mellitus with other specified complication: Secondary | ICD-10-CM | POA: Diagnosis not present

## 2017-09-23 LAB — LIPID PANEL
CHOL/HDL RATIO: 3.4 (calc) (ref <5.0)
CHOLESTEROL: 138 mg/dL (ref <200)
HDL: 41 mg/dL — ABNORMAL LOW (ref >50)
LDL CHOLESTEROL (CALC): 82 mg/dL
NON-HDL CHOLESTEROL (CALC): 97 mg/dL (ref <130)
Triglycerides: 66 mg/dL (ref <150)

## 2017-09-23 LAB — HEMOGLOBIN A1C
EAG (MMOL/L): 9.2 (calc)
HEMOGLOBIN A1C: 7.4 %{Hb} — AB (ref <5.7)
MEAN PLASMA GLUCOSE: 166 (calc)

## 2017-09-23 LAB — COMPLETE METABOLIC PANEL WITH GFR
AG Ratio: 1.3 (calc) (ref 1.0–2.5)
ALKALINE PHOSPHATASE (APISO): 114 U/L (ref 33–130)
ALT: 11 U/L (ref 6–29)
AST: 14 U/L (ref 10–35)
Albumin: 4.1 g/dL (ref 3.6–5.1)
BUN/Creatinine Ratio: 16 (calc) (ref 6–22)
BUN: 20 mg/dL (ref 7–25)
CO2: 29 mmol/L (ref 20–32)
CREATININE: 1.24 mg/dL — AB (ref 0.50–1.05)
Calcium: 9.6 mg/dL (ref 8.6–10.4)
Chloride: 104 mmol/L (ref 98–110)
GFR, Est African American: 57 mL/min/{1.73_m2} — ABNORMAL LOW (ref >=60)
GFR, Est Non African American: 50 mL/min/{1.73_m2} — ABNORMAL LOW (ref >=60)
GLOBULIN: 3.2 g/dL (ref 1.9–3.7)
GLUCOSE: 142 mg/dL — AB (ref 65–99)
Potassium: 4.2 mmol/L (ref 3.5–5.3)
SODIUM: 141 mmol/L (ref 135–146)
Total Bilirubin: 0.4 mg/dL (ref 0.2–1.2)
Total Protein: 7.3 g/dL (ref 6.1–8.1)

## 2017-09-23 LAB — MICROALBUMIN / CREATININE URINE RATIO
CREATININE, URINE: 166 mg/dL (ref 20–275)
MICROALB UR: 0.7 mg/dL
MICROALB/CREAT RATIO: 4 ug/mg{creat} (ref <30)

## 2017-09-29 ENCOUNTER — Ambulatory Visit (INDEPENDENT_AMBULATORY_CARE_PROVIDER_SITE_OTHER): Payer: Medicare Other | Admitting: Family Medicine

## 2017-09-29 ENCOUNTER — Ambulatory Visit (HOSPITAL_COMMUNITY)
Admission: RE | Admit: 2017-09-29 | Discharge: 2017-09-29 | Disposition: A | Discharge status: Home / Self Care | Payer: Medicare Other | Source: Ambulatory Visit | Attending: Family Medicine | Admitting: Family Medicine

## 2017-09-29 ENCOUNTER — Encounter: Payer: Self-pay | Admitting: Family Medicine

## 2017-09-29 VITALS — BP 124/80 | HR 74 | Resp 16 | Ht 60.0 in | Wt 198.0 lb

## 2017-09-29 DIAGNOSIS — I10 Essential (primary) hypertension: Secondary | ICD-10-CM

## 2017-09-29 DIAGNOSIS — E669 Obesity, unspecified: Secondary | ICD-10-CM | POA: Diagnosis not present

## 2017-09-29 DIAGNOSIS — M8938 Hypertrophy of bone, other site: Secondary | ICD-10-CM | POA: Diagnosis not present

## 2017-09-29 DIAGNOSIS — E1169 Type 2 diabetes mellitus with other specified complication: Secondary | ICD-10-CM

## 2017-09-29 DIAGNOSIS — M4316 Spondylolisthesis, lumbar region: Secondary | ICD-10-CM | POA: Diagnosis not present

## 2017-09-29 DIAGNOSIS — M5441 Lumbago with sciatica, right side: Secondary | ICD-10-CM | POA: Insufficient documentation

## 2017-09-29 DIAGNOSIS — E559 Vitamin D deficiency, unspecified: Secondary | ICD-10-CM | POA: Diagnosis not present

## 2017-09-29 DIAGNOSIS — E785 Hyperlipidemia, unspecified: Secondary | ICD-10-CM

## 2017-09-29 DIAGNOSIS — M545 Low back pain: Secondary | ICD-10-CM | POA: Diagnosis not present

## 2017-09-29 MED ORDER — HYDROCODONE-ACETAMINOPHEN 7.5-325 MG PO TABS: ORAL_TABLET | ORAL | 0 refills | Status: DC

## 2017-09-29 MED ORDER — TIZANIDINE HCL 4 MG PO CAPS: 4.0000 mg | ORAL_CAPSULE | Freq: Three times a day (TID) | ORAL | 0 refills | Status: DC | PRN

## 2017-09-29 MED ORDER — IBUPROFEN 400 MG PO TABS: ORAL_TABLET | ORAL | 0 refills | Status: DC

## 2017-09-29 NOTE — Progress Notes (Signed)
Kelli Spencer     MRN: [id number]      DOB: Apr 25, 1966   HPI Kelli Spencer is here for follow up and re-evaluation of chronic medical conditions, medication management and review of any available recent lab and radiology data.  Preventive health is updated, specifically  Cancer screening and Immunization.   Questions or concerns regarding consultations or procedures which the PT has had in the interim are  addressed. The PT denies any adverse reactions to current medications since the last visit.  6 day h/o acute right lower back pain radiating to RLQ rated at an 8 or 9 , colicky   Denies polyuria, polydipsia, blurred vision , or hypoglycemic episodes.   ROS Denies recent fever or chills. Denies sinus pressure, nasal congestion, ear pain or sore throat. Denies chest congestion, productive cough or wheezing. Denies chest pains, palpitations and leg swelling Denies , nausea, vomiting,diarrhea or constipation.   Denies dysuria, frequency, hesitancy or incontinence. Denies headaches, seizures, numbness, or tingling. Denies depression, anxiety or insomnia. Denies skin break down or rash.   PE  BP 124/80   Pulse 74   Resp 16   Ht 5' (1.524 m)   Wt 198 lb (89.8 kg)   LMP 06/10/1988 (Approximate)   SpO2 96%   BMI 38.67 kg/m   Patient alert and oriented and in no cardiopulmonary distress.Pt in pain, limited mobility due to back pain  HEENT: No facial asymmetry, EOMI,   oropharynx pink and moist.  Neck supple no JVD, no mass.  Chest: Clear to auscultation bilaterally.  CVS: S1, S2 no murmurs, no S3.Regular rate.  ABD: Soft non tender.   Ext: No edema  MS: Decreased  ROM lumbar spine,  and knees.  Skin: Intact, no ulcerations or rash noted.  Psych: Good eye contact, normal affect. Memory intact  anxious not  depressed appearing.  CNS: CN 2-12 intact, power,  normal throughout.no focal deficits noted.   Assessment & Plan  Acute low back pain with right-sided sciatica X  ray of lumbar spine and medication sent in for acute pain, which is musculoskeletal, if no relief, will need ortho eval  Diabetes mellitus type 2 in obese [facility]) Kelli Spencer is reminded of the importance of commitment to daily physical activity for 30 minutes or more, as able and the need to limit carbohydrate intake to 30 to 60 grams per meal to help with blood sugar control.   The need to take medication as prescribed, test blood sugar as directed, and to call between visits if there is a concern that blood sugar is uncontrolled is also discussed.   Kelli Spencer is reminded of the importance of daily foot exam, annual eye examination, and good blood sugar, blood pressure and cholesterol control. Deteriorated, needs to change diet to improve control, no med change Updated lab needed a next visit.   Diabetic Labs Latest Ref Rng & Units 09/22/2017 05/28/2017 04/28/2017 03/19/2017 03/13/2017  HbA1c <5.7 % of total Hgb 7.4(H) - 6.5(H) - -  Microalbumin mg/dL 0.7 <3.0(H) - - -  Micro/Creat Ratio <30 mcg/mg creat 4 <2.0 - - -  Chol <200 mg/dL 138 - - - -  HDL >50 mg/dL 41(L) - - - -  Calc LDL <100 mg/dL - - - - -  Triglycerides <150 mg/dL 66 - - - -  Creatinine 0.50 - 1.05 mg/dL 1.24(H) - 1.40(H) 1.15(H) 1.33(H)   BP/Weight 10/01/2017 09/29/2017 09/09/2017 07/01/2017 05/27/2017 04/28/2017 3/82/5053  Systolic BP - 976 734 -  530 051 102  Diastolic BP - 80 78 - 78 84 71  Wt. (Lbs) 200 198 190 195 190.5 189 -  BMI 39.06 38.67 37.11 38.08 37.2 36.91 -   Foot/eye exam completion dates 09/29/2017 08/21/2016  Foot Form Completion Done Done        Essential hypertension Controlled, no change in medication DASH diet and commitment to daily physical activity for a minimum of 30 minutes discussed and encouraged, as a part of hypertension management. The importance of attaining a healthy weight is also discussed.  BP/Weight 10/01/2017 09/29/2017 09/09/2017 07/01/2017 05/27/2017 04/28/2017 11/13/7354  Systolic  BP - 701 410 - 301 314 388  Diastolic BP - 80 78 - 78 84 71  Wt. (Lbs) 200 198 190 195 190.5 189 -  BMI 39.06 38.67 37.11 38.08 37.2 36.91 -       Dyslipidemia, goal LDL below 100 Hyperlipidemia:Low fat diet discussed and encouraged.   Lipid Panel  Lab Results  Component Value Date   CHOL 138 09/22/2017   HDL 41 (L) 09/22/2017   LDLCALC 85 12/04/2016   TRIG 66 09/22/2017   CHOLHDL 3.4 09/22/2017   Controlled, no change in medication     Obesity Deteriorated. Patient re-educated about  the importance of commitment to a  minimum of 150 minutes of exercise per week.  The importance of healthy food choices with portion control discussed. Encouraged to start a food diary, count calories and to consider  joining a support group. Sample diet sheets offered. Goals set by the patient for the next several months.   Weight /BMI 10/01/2017 09/29/2017 09/09/2017  WEIGHT 200 lb 198 lb 190 lb  HEIGHT 5\' 0"  5\' 0"  5\' 0"   BMI 39.06 kg/m2 38.67 kg/m2 37.11 kg/m2

## 2017-09-29 NOTE — Patient Instructions (Addendum)
F/u in 4.5 months, call if you need me before  Non fast hBa1C, chem 7 and EGFr and vit D 1 week before next visit  X ray of back today, and medication is sent to your pharmacy  Please reduce sweets and carbs   Hope you feel better soon call if not  Careful not to fall  Thank you  for choosing [facility]. We consider it a privelige to serve you.  Delivering excellent health care in a caring and  compassionate way is our goal.  Partnering with you,  so that together we can achieve this goal is our strategy.

## 2017-10-01 ENCOUNTER — Encounter: Payer: Medicare Other | Attending: Family Medicine | Admitting: Nutrition

## 2017-10-01 ENCOUNTER — Encounter: Payer: Self-pay | Admitting: Nutrition

## 2017-10-01 VITALS — Ht 60.0 in | Wt 200.0 lb

## 2017-10-01 DIAGNOSIS — Z713 Dietary counseling and surveillance: Secondary | ICD-10-CM | POA: Insufficient documentation

## 2017-10-01 DIAGNOSIS — E1169 Type 2 diabetes mellitus with other specified complication: Secondary | ICD-10-CM | POA: Insufficient documentation

## 2017-10-01 DIAGNOSIS — IMO0002 Reserved for concepts with insufficient information to code with codable children: Secondary | ICD-10-CM

## 2017-10-01 DIAGNOSIS — E669 Obesity, unspecified: Secondary | ICD-10-CM | POA: Diagnosis not present

## 2017-10-01 DIAGNOSIS — E118 Type 2 diabetes mellitus with unspecified complications: Secondary | ICD-10-CM

## 2017-10-01 DIAGNOSIS — E1165 Type 2 diabetes mellitus with hyperglycemia: Secondary | ICD-10-CM

## 2017-10-01 NOTE — Progress Notes (Signed)
  Medical Nutrition Therapy:  Appt start time: 1100 end time:  1130.   Assessment:  Primary concerns today: Follow up..  .last A1C 7.4% up from 6.5%. Hasn't been testing her blood sugars. Glipizide and Trajenta daily. Admits to eating sweets-chocolate. Skipping meals at times. Not exercising  much. Had to go to ER for some abdominal pain. Had some xray's done. No results yet.   Limited commitment. Still craves sweets. Diet low in fresh fruits and vegetables.   Lab Results  Component Value Date   HGBA1C 7.4 (H) 09/22/2017   CMP Latest Ref Rng & Units 09/22/2017 04/28/2017 03/19/2017  Glucose 65 - 99 mg/dL 142(H) 206(H) 189(H)  BUN 7 - 25 mg/dL 20 24 15   Creatinine 0.50 - 1.05 mg/dL 1.24(H) 1.40(H) 1.15(H)  Sodium 135 - 146 mmol/L 141 138 136  Potassium 3.5 - 5.3 mmol/L 4.2 4.7 4.4  Chloride 98 - 110 mmol/L 104 102 103  CO2 20 - 32 mmol/L 29 23 26   Calcium 8.6 - 10.4 mg/dL 9.6 10.0 8.7(L)  Total Protein 6.1 - 8.1 g/dL 7.3 8.4(H) -  Total Bilirubin 0.2 - 1.2 mg/dL 0.4 0.3 -  Alkaline Phos 33 - 130 U/L - 110 -  AST 10 - 35 U/L 14 13 -  ALT 6 - 29 U/L 11 14 -   Lipid Panel     Component Value Date/Time   CHOL 138 09/22/2017 0936   TRIG 66 09/22/2017 0936   HDL 41 (L) 09/22/2017 0936   CHOLHDL 3.4 09/22/2017 0936   VLDL 10 12/04/2016 0943   LDLCALC 85 12/04/2016 0943   Preferred Learning Style:   No preference indicated   Learning Readiness:     Ready  Change in progress   MEDICATIONS:    DIETARY INTAKE:   24-hr recall:  B ( AM):Boiled eggs or scrambled eggs, coffee,  Sometimes cherrios. Snk ( AM): L ( PM):  skipped Snk ( PM):  D ( PM): Fried chicken and string beans, Lipton tea lemon. Snk ( PM): Beverages: water  Usual physical activity: ADL  Estimated energy needs: 1200  calories 135 g carbohydrates 90 g protein 33 g fat  Progress Towards Goal(s):  In progress.   Nutritional Diagnosis:  NB-1.1 Food and nutrition-related knowledge deficit As  related to DIabetes.  As evidenced by A1C 6.5%.    Intervention:  Nutrition and Diabetes education provided on My Plate, CHO counting, meal planning, portion sizes, timing of meals, avoiding snacks between meals unless having a low blood sugar, target ranges for A1C and blood sugars, signs/symptoms and treatment of hyper/hypoglycemia, monitoring blood sugars, taking medications as prescribed, benefits of exercising 30 minutes per day and prevention of complications of DM. Marland Kitchen  Goals 1 Cut out fried chicken wings 2. Cut out sweet tea and sweetts 3. Drink only water 4. Go by the food pantry for food assistance. Use Whole wheat bread instead of crissant Walk 30 minutes daily Test blood sugar twice a day; am and before bed.. Teaching Method Utilized:  Visual Auditory Hands on  Handouts given during visit include:  The Plate Method  Meal Plan Card   Barriers to learning/adherence to lifestyle change: none  Demonstrated degree of understanding via:  Teach Back   Monitoring/Evaluation:  Dietary intake, exercise, meal planning, and body weight in 3 month(s).

## 2017-10-01 NOTE — Patient Instructions (Signed)
Goals 1 Cut out fried chicken wings 2. Cut out sweet tea and sweetts 3. Drink only water 4. Go by the food pantry for food assistance. Use Whole wheat bread instead of crissant Walk 30 minutes daily Test blood sugar twice a day; am and before bed.Marland Kitchen

## 2017-10-07 ENCOUNTER — Encounter: Payer: Self-pay | Admitting: Family Medicine

## 2017-10-12 ENCOUNTER — Encounter: Payer: Self-pay | Admitting: Family Medicine

## 2017-10-12 NOTE — Assessment & Plan Note (Addendum)
Kelli Spencer is reminded of the importance of commitment to daily physical activity for 30 minutes or more, as able and the need to limit carbohydrate intake to 30 to 60 grams per meal to help with blood sugar control.   The need to take medication as prescribed, test blood sugar as directed, and to call between visits if there is a concern that blood sugar is uncontrolled is also discussed.   Kelli Spencer is reminded of the importance of daily foot exam, annual eye examination, and good blood sugar, blood pressure and cholesterol control. Deteriorated, needs to change diet to improve control, no med change Updated lab needed a next visit.   Diabetic Labs Latest Ref Rng & Units 09/22/2017 05/28/2017 04/28/2017 03/19/2017 03/13/2017  HbA1c <5.7 % of total Hgb 7.4(H) - 6.5(H) - -  Microalbumin mg/dL 0.7 <3.0(H) - - -  Micro/Creat Ratio <30 mcg/mg creat 4 <2.0 - - -  Chol <200 mg/dL 138 - - - -  HDL >50 mg/dL 41(L) - - - -  Calc LDL <100 mg/dL - - - - -  Triglycerides <150 mg/dL 66 - - - -  Creatinine 0.50 - 1.05 mg/dL 1.24(H) - 1.40(H) 1.15(H) 1.33(H)   BP/Weight 10/01/2017 09/29/2017 09/09/2017 07/01/2017 05/27/2017 04/28/2017 4/58/5929  Systolic BP - 244 628 - 638 177 116  Diastolic BP - 80 78 - 78 84 71  Wt. (Lbs) 200 198 190 195 190.5 189 -  BMI 39.06 38.67 37.11 38.08 37.2 36.91 -   Foot/eye exam completion dates 09/29/2017 08/21/2016  Foot Form Completion Done Done

## 2017-10-12 NOTE — Assessment & Plan Note (Signed)
Deteriorated. Patient re-educated about  the importance of commitment to a  minimum of 150 minutes of exercise per week.  The importance of healthy food choices with portion control discussed. Encouraged to start a food diary, count calories and to consider  joining a support group. Sample diet sheets offered. Goals set by the patient for the next several months.   Weight /BMI 10/01/2017 09/29/2017 09/09/2017  WEIGHT 200 lb 198 lb 190 lb  HEIGHT 5\' 0"  5\' 0"  5\' 0"   BMI 39.06 kg/m2 38.67 kg/m2 37.11 kg/m2

## 2017-10-12 NOTE — Assessment & Plan Note (Addendum)
X ray of lumbar spine and medication sent in for acute pain, which is musculoskeletal, if no relief, will need ortho eval

## 2017-10-12 NOTE — Assessment & Plan Note (Signed)
Hyperlipidemia:Low fat diet discussed and encouraged.   Lipid Panel  Lab Results  Component Value Date   CHOL 138 09/22/2017   HDL 41 (L) 09/22/2017   LDLCALC 85 12/04/2016   TRIG 66 09/22/2017   CHOLHDL 3.4 09/22/2017   Controlled, no change in medication

## 2017-10-12 NOTE — Assessment & Plan Note (Signed)
Controlled, no change in medication DASH diet and commitment to daily physical activity for a minimum of 30 minutes discussed and encouraged, as a part of hypertension management. The importance of attaining a healthy weight is also discussed.  BP/Weight 10/01/2017 09/29/2017 09/09/2017 07/01/2017 05/27/2017 04/28/2017 06/17/9469  Systolic BP - 761 518 - 343 735 789  Diastolic BP - 80 78 - 78 84 71  Wt. (Lbs) 200 198 190 195 190.5 189 -  BMI 39.06 38.67 37.11 38.08 37.2 36.91 -

## 2017-10-18 ENCOUNTER — Other Ambulatory Visit: Payer: Self-pay | Admitting: Family Medicine

## 2017-10-19 NOTE — Telephone Encounter (Signed)
Seen 11 27 18

## 2017-11-04 ENCOUNTER — Telehealth: Payer: Self-pay

## 2017-11-04 NOTE — Telephone Encounter (Signed)
Has been having side effects from the amlodipine 2.5 and wants it changed. Has been having all over itching for awhile now and her legs have been swelling and she checked and its all side effects of the amlodipine. Please advise

## 2017-11-05 ENCOUNTER — Other Ambulatory Visit: Payer: Self-pay | Admitting: Family Medicine

## 2017-11-05 MED ORDER — HYDROCHLOROTHIAZIDE 12.5 MG PO CAPS: 12.5000 mg | ORAL_CAPSULE | Freq: Every day | ORAL | 1 refills | Status: DC

## 2017-11-05 MED ORDER — POTASSIUM CHLORIDE CRYS ER 10 MEQ PO TBCR: 10.0000 meq | EXTENDED_RELEASE_TABLET | Freq: Every day | ORAL | 1 refills | Status: DC

## 2017-11-05 NOTE — Telephone Encounter (Signed)
Reports swelling and itching, diiscontinued and started on hCTZ 12.5 mg and  klorcon 10 meq daily

## 2017-11-17 ENCOUNTER — Ambulatory Visit: Payer: Medicare Other | Admitting: Nutrition

## 2017-11-23 ENCOUNTER — Other Ambulatory Visit: Payer: Self-pay | Admitting: Family Medicine

## 2017-11-25 ENCOUNTER — Other Ambulatory Visit: Payer: Self-pay | Admitting: Family Medicine

## 2017-11-25 DIAGNOSIS — E785 Hyperlipidemia, unspecified: Secondary | ICD-10-CM

## 2017-12-16 ENCOUNTER — Ambulatory Visit (INDEPENDENT_AMBULATORY_CARE_PROVIDER_SITE_OTHER): Payer: Medicare Other

## 2017-12-16 VITALS — BP 118/80 | HR 65 | Resp 16 | Ht 60.0 in | Wt 197.0 lb

## 2017-12-16 DIAGNOSIS — Z0000 Encounter for general adult medical examination without abnormal findings: Secondary | ICD-10-CM | POA: Diagnosis not present

## 2017-12-16 NOTE — Patient Instructions (Signed)
Kelli Spencer , Thank you for taking time to come for your Medicare Wellness Visit. I appreciate your ongoing commitment to your health goals. Please review the following plan we discussed and let me know if I can assist you in the future.   Screening recommendations/referrals: Colonoscopy: due  Mammogram: due 12/18/2017 Bone Density: not due  Recommended yearly ophthalmology/optometry visit for glaucoma screening and checkup Recommended yearly dental visit for hygiene and checkup  Vaccinations: Influenza vaccine: complete Pneumococcal vaccine: postponed per patient  Tdap vaccine: postponed per patient  Shingles vaccine: check with provider    Advanced directives: info given   Conditions/risks identified: yes  Next appointment: scheduled   Preventive Care 40-64 Years, Female Preventive care refers to lifestyle choices and visits with your health care provider that can promote health and wellness. What does preventive care include?  A yearly physical exam. This is also called an annual well check.  Dental exams once or twice a year.  Routine eye exams. Ask your health care provider how often you should have your eyes checked.  Personal lifestyle choices, including:  Daily care of your teeth and gums.  Regular physical activity.  Eating a healthy diet.  Avoiding tobacco and drug use.  Limiting alcohol use.  Practicing safe sex.  Taking low-dose aspirin daily starting at age 74.  Taking vitamin and mineral supplements as recommended by your health care provider. What happens during an annual well check? The services and screenings done by your health care provider during your annual well check will depend on your age, overall health, lifestyle risk factors, and family history of disease. Counseling  Your health care provider may ask you questions about your:  Alcohol use.  Tobacco use.  Drug use.  Emotional well-being.  Home and relationship  well-being.  Sexual activity.  Eating habits.  Work and work Statistician.  Method of birth control.  Menstrual cycle.  Pregnancy history. Screening  You may have the following tests or measurements:  Height, weight, and BMI.  Blood pressure.  Lipid and cholesterol levels. These may be checked every 5 years, or more frequently if you are over 25 years old.  Skin check.  Lung cancer screening. You may have this screening every year starting at age 58 if you have a 30-pack-year history of smoking and currently smoke or have quit within the past 15 years.  Fecal occult blood test (FOBT) of the stool. You may have this test every year starting at age 39.  Flexible sigmoidoscopy or colonoscopy. You may have a sigmoidoscopy every 5 years or a colonoscopy every 10 years starting at age 86.  Hepatitis C blood test.  Hepatitis B blood test.  Sexually transmitted disease (STD) testing.  Diabetes screening. This is done by checking your blood sugar (glucose) after you have not eaten for a while (fasting). You may have this done every 1-3 years.  Mammogram. This may be done every 1-2 years. Talk to your health care provider about when you should start having regular mammograms. This may depend on whether you have a family history of breast cancer.  BRCA-related cancer screening. This may be done if you have a family history of breast, ovarian, tubal, or peritoneal cancers.  Pelvic exam and Pap test. This may be done every 3 years starting at age 7. Starting at age 93, this may be done every 5 years if you have a Pap test in combination with an HPV test.  Bone density scan. This is done to screen  for osteoporosis. You may have this scan if you are at high risk for osteoporosis. Discuss your test results, treatment options, and if necessary, the need for more tests with your health care provider. Vaccines  Your health care provider may recommend certain vaccines, such  as:  Influenza vaccine. This is recommended every year.  Tetanus, diphtheria, and acellular pertussis (Tdap, Td) vaccine. You may need a Td booster every 10 years.  Zoster vaccine. You may need this after age 47.  Pneumococcal 13-valent conjugate (PCV13) vaccine. You may need this if you have certain conditions and were not previously vaccinated.  Pneumococcal polysaccharide (PPSV23) vaccine. You may need one or two doses if you smoke cigarettes or if you have certain conditions. Talk to your health care provider about which screenings and vaccines you need and how often you need them. This information is not intended to replace advice given to you by your health care provider. Make sure you discuss any questions you have with your health care provider. Document Released: 11/16/2015 Document Revised: 07/09/2016 Document Reviewed: 08/21/2015 Elsevier Interactive Patient Education  2017 [place] Prevention in the Home Falls can cause injuries. They can happen to people of all ages. There are many things you can do to make your home safe and to help prevent falls. What can I do on the outside of my home?  Regularly fix the edges of walkways and driveways and fix any cracks.  Remove anything that might make you trip as you walk through a door, such as a raised step or threshold.  Trim any bushes or trees on the path to your home.  Use bright outdoor lighting.  Clear any walking paths of anything that might make someone trip, such as rocks or tools.  Regularly check to see if handrails are loose or broken. Make sure that both sides of any steps have handrails.  Any raised decks and porches should have guardrails on the edges.  Have any leaves, snow, or ice cleared regularly.  Use sand or salt on walking paths during winter.  Clean up any spills in your garage right away. This includes oil or grease spills. What can I do in the bathroom?  Use night  lights.  Install grab bars by the toilet and in the tub and shower. Do not use towel bars as grab bars.  Use non-skid mats or decals in the tub or shower.  If you need to sit down in the shower, use a plastic, non-slip stool.  Keep the floor dry. Clean up any water that spills on the floor as soon as it happens.  Remove soap buildup in the tub or shower regularly.  Attach bath mats securely with double-sided non-slip rug tape.  Do not have throw rugs and other things on the floor that can make you trip. What can I do in the bedroom?  Use night lights.  Make sure that you have a light by your bed that is easy to reach.  Do not use any sheets or blankets that are too big for your bed. They should not hang down onto the floor.  Have a firm chair that has side arms. You can use this for support while you get dressed.  Do not have throw rugs and other things on the floor that can make you trip. What can I do in the kitchen?  Clean up any spills right away.  Avoid walking on wet floors.  Keep items that you use  a lot in easy-to-reach places.  If you need to reach something above you, use a strong step stool that has a grab bar.  Keep electrical cords out of the way.  Do not use floor polish or wax that makes floors slippery. If you must use wax, use non-skid floor wax.  Do not have throw rugs and other things on the floor that can make you trip. What can I do with my stairs?  Do not leave any items on the stairs.  Make sure that there are handrails on both sides of the stairs and use them. Fix handrails that are broken or loose. Make sure that handrails are as long as the stairways.  Check any carpeting to make sure that it is firmly attached to the stairs. Fix any carpet that is loose or worn.  Avoid having throw rugs at the top or bottom of the stairs. If you do have throw rugs, attach them to the floor with carpet tape.  Make sure that you have a light switch at the  top of the stairs and the bottom of the stairs. If you do not have them, ask someone to add them for you. What else can I do to help prevent falls?  Wear shoes that:  Do not have high heels.  Have rubber bottoms.  Are comfortable and fit you well.  Are closed at the toe. Do not wear sandals.  If you use a stepladder:  Make sure that it is fully opened. Do not climb a closed stepladder.  Make sure that both sides of the stepladder are locked into place.  Ask someone to hold it for you, if possible.  Clearly mark and make sure that you can see:  Any grab bars or handrails.  First and last steps.  Where the edge of each step is.  Use tools that help you move around (mobility aids) if they are needed. These include:  Canes.  Walkers.  Scooters.  Crutches.  Turn on the lights when you go into a dark area. Replace any light bulbs as soon as they burn out.  Set up your furniture so you have a clear path. Avoid moving your furniture around.  If any of your floors are uneven, fix them.  If there are any pets around you, be aware of where they are.  Review your medicines with your doctor. Some medicines can make you feel dizzy. This can increase your chance of falling. Ask your doctor what other things that you can do to help prevent falls. This information is not intended to replace advice given to you by your health care provider. Make sure you discuss any questions you have with your health care provider. Document Released: 08/16/2009 Document Revised: 03/27/2016 Document Reviewed: 11/24/2014 Elsevier Interactive Patient Education  2017 [employer].

## 2017-12-16 NOTE — Progress Notes (Signed)
Subjective:   Kelli Spencer is a 52 y.o. female who presents for Medicare Annual (Subsequent) preventive examination.  Review of Systems:  Cardiac Risk Factors include: none     Objective:     Vitals: BP 118/80   Pulse 65   Resp 16   Ht 5' (1.524 m)   Wt 197 lb (89.4 kg)   LMP 06/10/1988 (Approximate)   SpO2 96%   BMI 38.47 kg/m   Body mass index is 38.47 kg/m.  Advanced Directives 12/16/2017 04/27/2017 04/24/2017 04/07/2017 03/18/2017 03/13/2017 12/09/2016  Does Patient Have a Medical Advance Directive? No No No No No No No  Would patient like information on creating a medical advance directive? Yes (ED - Information included in AVS) - - - No - Patient declined No - Patient declined No - Patient declined  Pre-existing out of facility DNR order (yellow form or pink MOST form) - - - - - - -    Tobacco Social History   Tobacco Use  Smoking Status Never Smoker  Smokeless Tobacco Never Used     Counseling given: Not Answered   Clinical Intake:     Pain : No/denies pain     Nutritional Status: BMI > 30  Obese Diabetes: Yes  How often do you need to have someone help you when you read instructions, pamphlets, or other written materials from your doctor or pharmacy?: 1 - Never What is the last grade level you completed in school?: 9th   Interpreter Needed?: No  Information entered by :: Kate Sable, LPN   Past Medical History:  Diagnosis Date  . Arthritis   . Asthma   . Diabetes mellitus without complication ([place])   . Diastolic hypertension   . GERD (gastroesophageal reflux disease)   . Hyperlipidemia   . Hypertension   . Migraines   . Obesity   . Seizures ([place])    pt states she has seizures only when she gets upset. pt is taking no seizure meds   Past Surgical History:  Procedure Laterality Date  . ABDOMINAL HYSTERECTOMY  1997   fibroids  . COLONOSCOPY N/A 06/23/2016   Procedure: COLONOSCOPY;  Surgeon: Danie Binder, MD;  Location: [facility];   Service: Endoscopy;  Laterality: N/A;  11:30 AM  . KNEE ARTHROSCOPY WITH MEDIAL MENISECTOMY Left 02/09/2014   Procedure: KNEE ARTHROSCOPY WITH MEDIAL MENISECTOMY;  Surgeon: Carole Civil, MD;  Location: [facility] OR[facility];  Service: Orthopedics;  Laterality: Left;  . PARTIAL HYSTERECTOMY    . TOTAL KNEE ARTHROPLASTY Right 03/18/2017   Procedure: TOTAL KNEE ARTHROPLASTY;  Surgeon: Carole Civil, MD;  Location: [facility];  Service: Orthopedics;  Laterality: Right;   Family History  Problem Relation Age of Onset  . Kidney failure Mother   . Diabetes Mother   . Hypertension Mother   . Stroke Mother   . Diabetes Father   . Heart disease Father   . Hypertension Sister    Social History   Socioeconomic History  . Marital status: Divorced    Spouse name: None  . Number of children: 3  . Years of education: None  . Highest education level: None  Social Needs  . Financial resource strain: Not very hard  . Food insecurity - worry: None  . Food insecurity - inability: None  . Transportation needs - medical: Patient refused  . Transportation needs - non-medical: Patient refused  Occupational History  . Occupation: disabled  Tobacco Use  . Smoking status: Never  Smoker  . Smokeless tobacco: Never Used  Substance and Sexual Activity  . Alcohol use: No  . Drug use: No  . Sexual activity: Not Currently  Other Topics Concern  . None  Social History Narrative  . None    Outpatient Encounter Medications as of 12/16/2017  Medication Sig  . albuterol (PROVENTIL HFA;VENTOLIN HFA) 108 (90 Base) MCG/ACT inhaler Inhale 2 puffs into the lungs every 6 (six) hours as needed for wheezing or shortness of breath.  Marland Kitchen aspirin EC 325 MG EC tablet Take 1 tablet (325 mg total) by mouth daily with breakfast.  . cholecalciferol (VITAMIN D) 400 UNITS TABS tablet Take 800 Units by mouth daily.  Marland Kitchen glipiZIDE (GLUCOTROL) 10 MG tablet TAKE 1 TABLET BY MOUTH EVERY DAY  . glucose blood (ONETOUCH VERIO) test strip Once  daily testing  . hydrochlorothiazide (MICROZIDE) 12.5 MG capsule Take 1 capsule (12.5 mg total) by mouth daily.  Marland Kitchen HYDROcodone-acetaminophen (NORCO) 7.5-325 MG tablet One tablet two times daily as needed for uncontrolled back pain  . hydrOXYzine (ATARAX/VISTARIL) 50 MG tablet TAKE 1 TABLET BY MOUTH AT BEDTIME FOR ITCHING OR ANXIETY  . ibuprofen (ADVIL,MOTRIN) 400 MG tablet Two tablets every 8 hours for 1 week only  . omeprazole (PRILOSEC) 40 MG capsule Take 1 capsule (40 mg total) by mouth 2 (two) times daily.  Glory Rosebush DELICA LANCETS 79K MISC TO USE WITH ONE TOUCH VERIO FLEX, TEST ONCE A DAY  . potassium chloride (K-DUR,KLOR-CON) 10 MEQ tablet Take 1 tablet (10 mEq total) by mouth daily.  . pravastatin (PRAVACHOL) 10 MG tablet TAKE 1 TABLET(10 MG) BY MOUTH DAILY  . senna (SENOKOT) 8.6 MG TABS tablet Take 1 tablet (8.6 mg total) by mouth 2 (two) times daily.  Marland Kitchen tiZANidine (ZANAFLEX) 4 MG capsule Take 1 capsule (4 mg total) by mouth 3 (three) times daily as needed for muscle spasms.  . TRADJENTA 5 MG TABS tablet TAKE 1 TABLET(5 MG) BY MOUTH DAILY   No facility-administered encounter medications on file as of 12/16/2017.     Activities of Daily Living In your present state of health, do you have any difficulty performing the following activities: 12/16/2017 03/18/2017  Hearing? N -  Vision? N -  Difficulty concentrating or making decisions? N -  Walking or climbing stairs? N -  Dressing or bathing? N -  Doing errands, shopping? N N  Preparing Food and eating ? N -  Using the Toilet? N -  In the past six months, have you accidently leaked urine? N -  Do you have problems with loss of bowel control? N -  Managing your Medications? N -  Managing your Finances? N -  Housekeeping or managing your Housekeeping? N -  Some recent data might be hidden    Patient Care Team: Fayrene Helper, MD as PCP - General (Family Medicine) Carole Civil, MD as Consulting Physician (Orthopedic  Surgery) Arlana Lindau, RD as Dietitian (Nutrition)    Assessment:   This is a routine wellness examination for [name].  Exercise Activities and Dietary recommendations Current Exercise Habits: Home exercise routine;The patient does not participate in regular exercise at present  Goals    None      Fall Risk Fall Risk  12/16/2017 05/27/2017 12/04/2016 10/07/2016 08/28/2016  Falls in the past year? No No No No No   Is the patient's home free of loose throw rugs in walkways, pet beds, electrical cords, etc?   yes  Grab bars in the bathroom? yes      Handrails on the stairs?   yes      Adequate lighting?   yes   Depression Screen PHQ 2/9 Scores 12/16/2017 05/27/2017 12/04/2016 10/07/2016  PHQ - 2 Score 2 0 0 0  PHQ- 9 Score 10 - - -     Cognitive Function         There is no immunization history on file for this patient.  Qualifies for Shingles Vaccine? yes  Screening Tests Health Maintenance  Topic Date Due  . OPHTHALMOLOGY EXAM  01/21/2017  . INFLUENZA VACCINE  02/22/2018 (Originally 06/03/2017)  . TETANUS/TDAP  01/13/2019 (Originally 08/03/1983)  . PNEUMOCOCCAL POLYSACCHARIDE VACCINE (1) 01/20/2019 (Originally 08/02/1966)  . HEMOGLOBIN A1C  03/22/2018  . COLON CANCER SCREENING ANNUAL FOBT  05/27/2018  . URINE MICROALBUMIN  09/22/2018  . FOOT EXAM  10/12/2018  . MAMMOGRAM  12/18/2018  . PAP SMEAR  05/27/2020  . Hepatitis C Screening  Completed  . HIV Screening  Completed    Cancer Screenings: Lung: Low Dose CT Chest recommended if Age 45-80 years, 30 pack-year currently smoking OR have quit w/in 15years. Patient does not qualify. Breast:  Up to date on Mammogram? Yes   Up to date of Bone Density/Dexa? Yes Colorectal: done 06/16/2016  Additional Screenings:       Plan:      I have personally reviewed and noted the following in the patient's chart:   . Medical and social history . Use of alcohol, tobacco or illicit drugs  . Current medications and  supplements . Functional ability and status . Nutritional status . Physical activity . Advanced directives . List of other physicians . Hospitalizations, surgeries, and ER visits in previous 12 months . Vitals . Screenings to include cognitive, depression, and falls . Referrals and appointments  In addition, I have reviewed and discussed with patient certain preventive protocols, quality metrics, and best practice recommendations. A written personalized care plan for preventive services as well as general preventive health recommendations were provided to patient.     Kate Sable, LPN, LPN  [id number]

## 2017-12-17 IMAGING — DX DG CHEST 2V
2 series · 2 of 2 positions shown · non-contrast
Comparison: Chest x-ray of January 03, 2016

CLINICAL DATA: Fever, cough, and shortness of breath for the past 2
weeks. History of hypertension, diabetes, and asthma. Nonsmoker.

EXAM:
CHEST  2 VIEW

[chest pa]
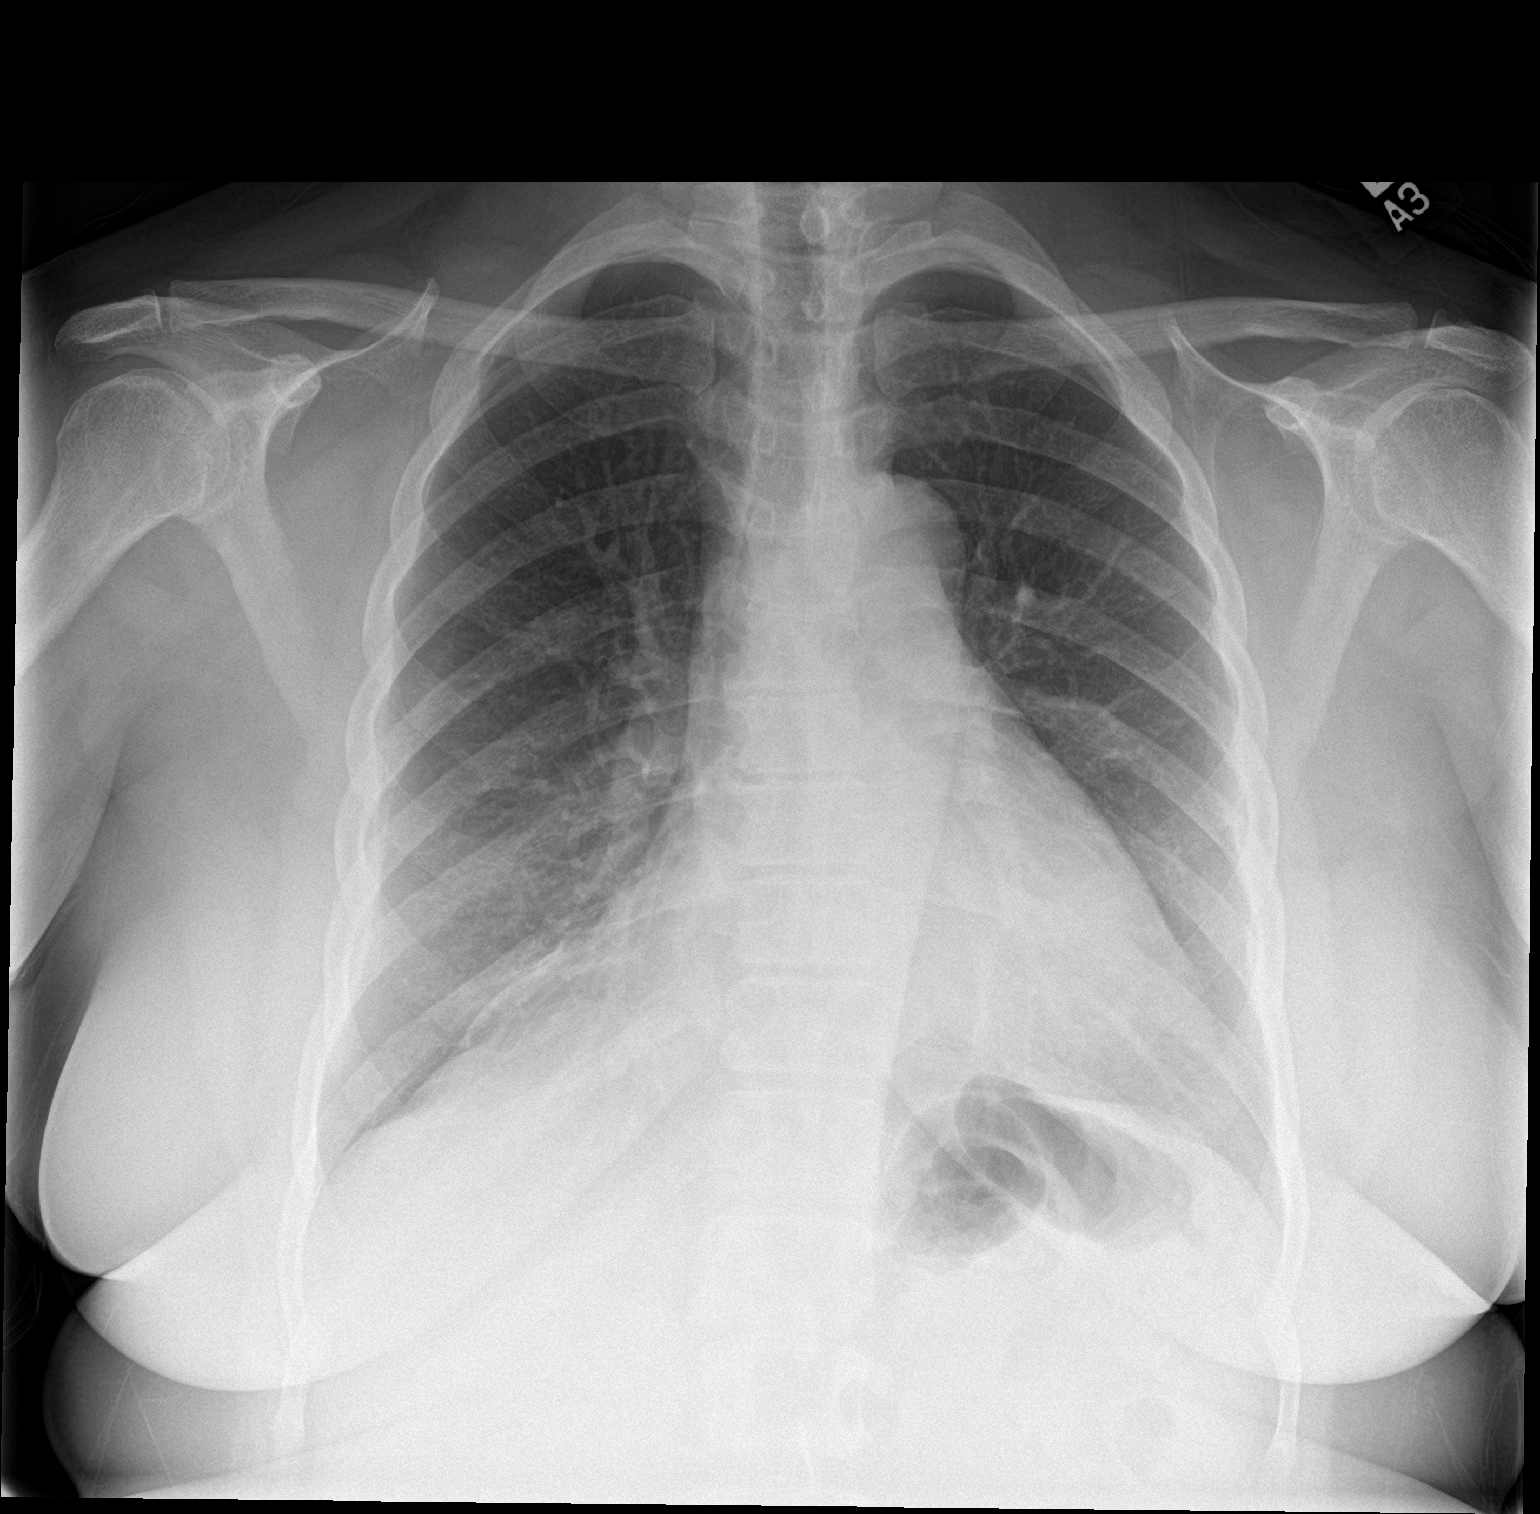

[chest lat]
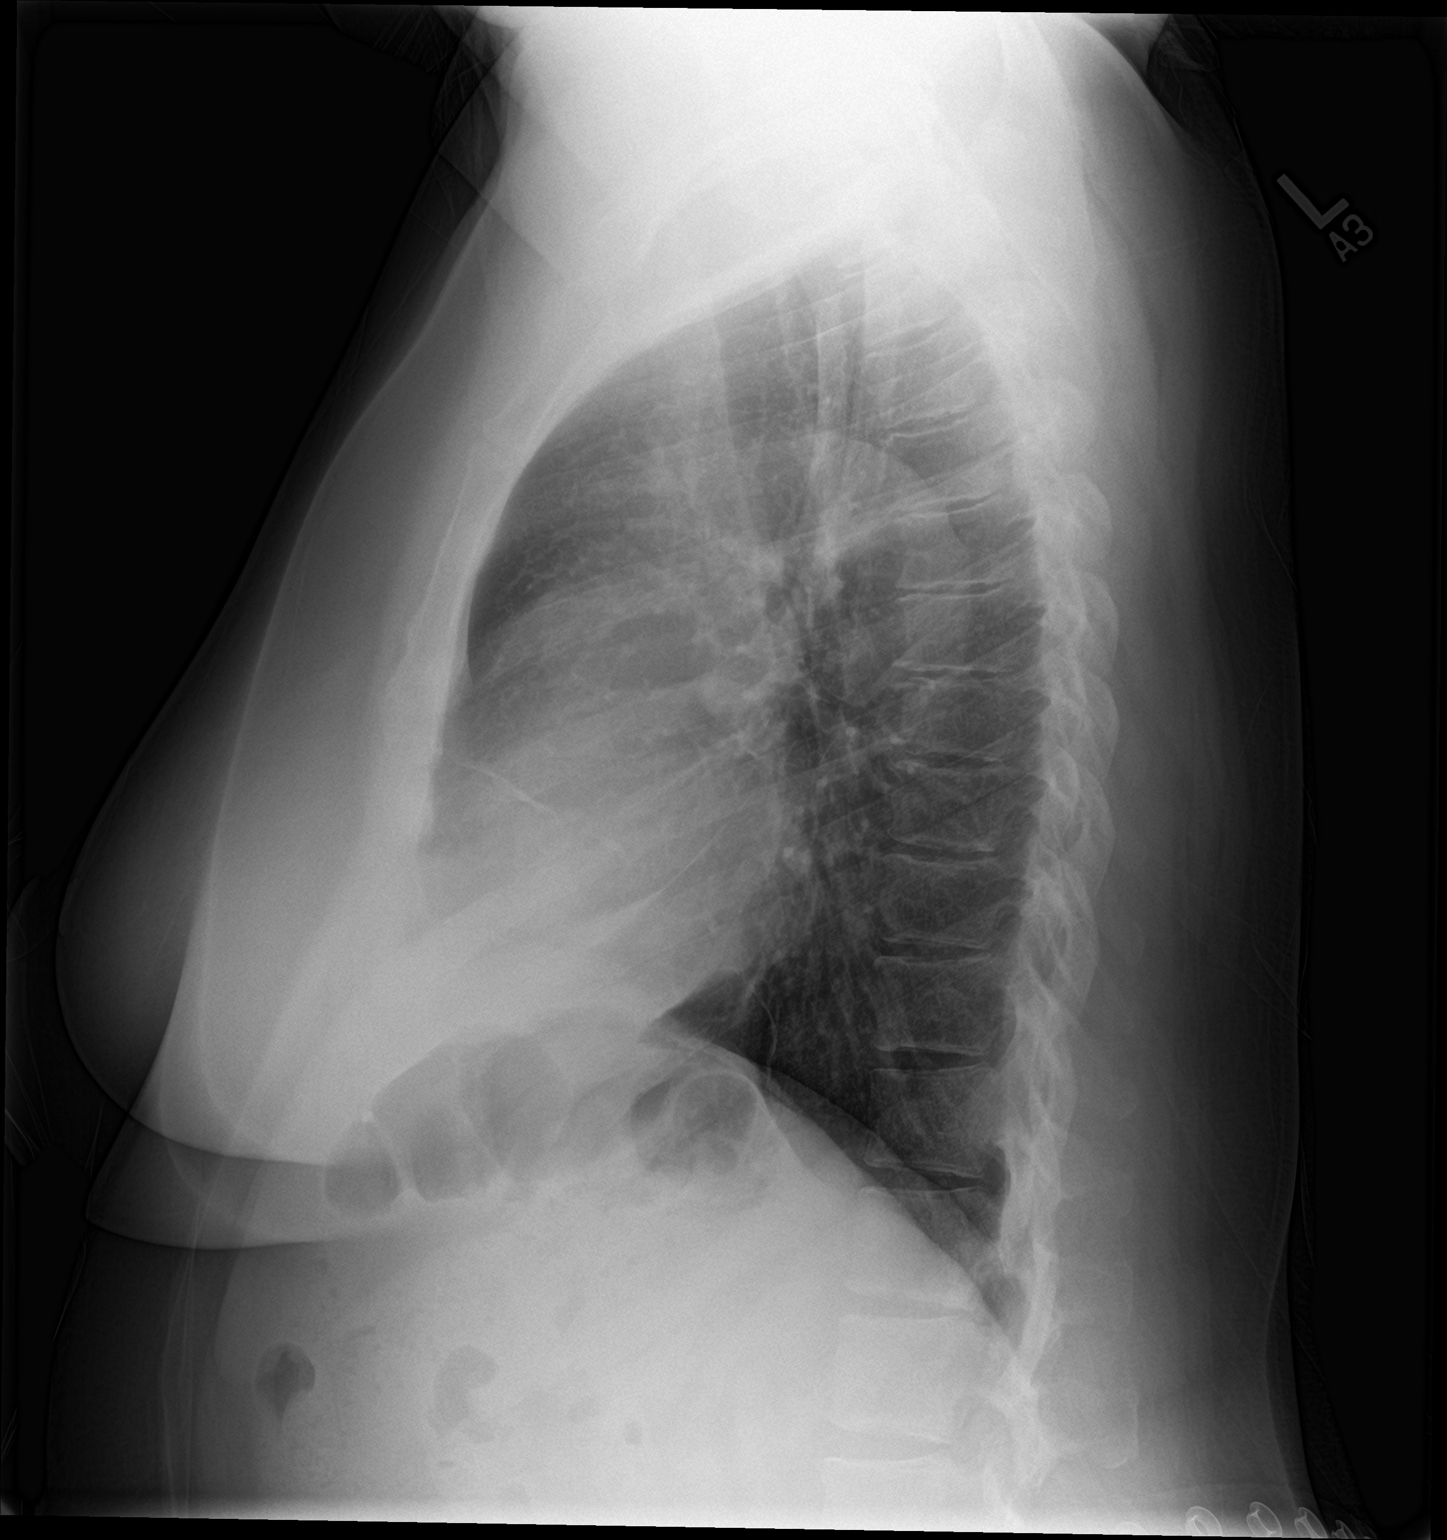

[2 of 2 positions shown; findings below may reference images not displayed]

FINDINGS: The lungs are well-expanded. There are coarse lung markings in the
right infrahilar region which are chronic. There is no pleural
effusion. The heart is top-normal in size. The pulmonary vascularity
is normal. The trachea is midline. The bony thorax exhibits no acute
abnormality.
IMPRESSION: There is no acute cardiopulmonary abnormality.

## 2017-12-31 ENCOUNTER — Other Ambulatory Visit: Payer: Self-pay | Admitting: Family Medicine

## 2017-12-31 DIAGNOSIS — E1169 Type 2 diabetes mellitus with other specified complication: Secondary | ICD-10-CM

## 2017-12-31 DIAGNOSIS — E669 Obesity, unspecified: Principal | ICD-10-CM

## 2018-01-02 ENCOUNTER — Other Ambulatory Visit: Payer: Self-pay | Admitting: Family Medicine

## 2018-01-02 DIAGNOSIS — E669 Obesity, unspecified: Principal | ICD-10-CM

## 2018-01-02 DIAGNOSIS — E1169 Type 2 diabetes mellitus with other specified complication: Secondary | ICD-10-CM

## 2018-01-19 DIAGNOSIS — E559 Vitamin D deficiency, unspecified: Secondary | ICD-10-CM | POA: Diagnosis not present

## 2018-01-19 DIAGNOSIS — E1169 Type 2 diabetes mellitus with other specified complication: Secondary | ICD-10-CM | POA: Diagnosis not present

## 2018-01-20 LAB — BASIC METABOLIC PANEL WITH GFR
BUN/Creatinine Ratio: 20 (calc) (ref 6–22)
BUN: 26 mg/dL — AB (ref 7–25)
CO2: 29 mmol/L (ref 20–32)
CREATININE: 1.28 mg/dL — AB (ref 0.50–1.05)
Calcium: 10.4 mg/dL (ref 8.6–10.4)
Chloride: 101 mmol/L (ref 98–110)
GFR, EST NON AFRICAN AMERICAN: 48 mL/min/{1.73_m2} — AB (ref >=60)
GFR, Est African American: 55 mL/min/{1.73_m2} — ABNORMAL LOW (ref >=60)
GLUCOSE: 175 mg/dL — AB (ref 65–99)
Potassium: 5.1 mmol/L (ref 3.5–5.3)
SODIUM: 140 mmol/L (ref 135–146)

## 2018-01-20 LAB — HEMOGLOBIN A1C
Hgb A1c MFr Bld: 8.1 % of total Hgb — ABNORMAL HIGH (ref <5.7)
MEAN PLASMA GLUCOSE: 186 (calc)
eAG (mmol/L): 10.3 (calc)

## 2018-01-20 LAB — VITAMIN D 25 HYDROXY (VIT D DEFICIENCY, FRACTURES): Vit D, 25-Hydroxy: 45 ng/mL (ref 30–100)

## 2018-01-26 ENCOUNTER — Encounter: Payer: Self-pay | Admitting: Gastroenterology

## 2018-01-26 ENCOUNTER — Encounter: Payer: Self-pay | Admitting: Family Medicine

## 2018-01-26 ENCOUNTER — Ambulatory Visit (INDEPENDENT_AMBULATORY_CARE_PROVIDER_SITE_OTHER): Payer: Medicare Other | Admitting: Family Medicine

## 2018-01-26 VITALS — BP 112/80 | HR 74 | Resp 16 | Ht 60.0 in | Wt 191.0 lb

## 2018-01-26 DIAGNOSIS — R1319 Other dysphagia: Secondary | ICD-10-CM

## 2018-01-26 DIAGNOSIS — E669 Obesity, unspecified: Secondary | ICD-10-CM

## 2018-01-26 DIAGNOSIS — E1121 Type 2 diabetes mellitus with diabetic nephropathy: Secondary | ICD-10-CM | POA: Diagnosis not present

## 2018-01-26 DIAGNOSIS — K219 Gastro-esophageal reflux disease without esophagitis: Secondary | ICD-10-CM

## 2018-01-26 DIAGNOSIS — I10 Essential (primary) hypertension: Secondary | ICD-10-CM | POA: Diagnosis not present

## 2018-01-26 DIAGNOSIS — R1310 Dysphagia, unspecified: Secondary | ICD-10-CM

## 2018-01-26 DIAGNOSIS — Z1231 Encounter for screening mammogram for malignant neoplasm of breast: Secondary | ICD-10-CM

## 2018-01-26 DIAGNOSIS — E785 Hyperlipidemia, unspecified: Secondary | ICD-10-CM | POA: Diagnosis not present

## 2018-01-26 DIAGNOSIS — IMO0002 Reserved for concepts with insufficient information to code with codable children: Secondary | ICD-10-CM

## 2018-01-26 DIAGNOSIS — E1165 Type 2 diabetes mellitus with hyperglycemia: Secondary | ICD-10-CM

## 2018-01-26 DIAGNOSIS — E1169 Type 2 diabetes mellitus with other specified complication: Secondary | ICD-10-CM

## 2018-01-26 MED ORDER — TIZANIDINE HCL 2 MG PO CAPS: ORAL_CAPSULE | ORAL | 2 refills | Status: DC

## 2018-01-26 MED ORDER — HYDROXYZINE HCL 50 MG PO TABS: ORAL_TABLET | ORAL | 1 refills | Status: DC

## 2018-01-26 MED ORDER — PRAVASTATIN SODIUM 10 MG PO TABS
ORAL_TABLET | ORAL | 1 refills | Status: DC
Start: 2018-01-26 — End: 2018-09-18

## 2018-01-26 MED ORDER — LINAGLIPTIN 5 MG PO TABS
ORAL_TABLET | ORAL | 1 refills | Status: DC
Start: 2018-01-26 — End: 2018-09-22

## 2018-01-26 MED ORDER — BLOOD GLUCOSE METER KIT: PACK | 0 refills | Status: DC

## 2018-01-26 MED ORDER — GLIPIZIDE 10 MG PO TABS: 10.0000 mg | ORAL_TABLET | Freq: Every day | ORAL | 1 refills | Status: DC

## 2018-01-26 NOTE — Patient Instructions (Addendum)
Annual physical exam July 26 or after, call if you need me sooner  Mammogram is to be scheduled at checkout   You need to test blood sugar every morning and record. It is out of control now, but I know you will fix this. Goal for fasting blood sugar ranges from 90 to 130   We will send in new meter and supply for once daily testing  Non fasting CBC, chem 7 and EGFR and HBA1C 1 week before next visit   You are referred to Dr Oneida Alar about reflux  You are referred to [facility] " in [place] for eye exam   Thank you  for choosing [facility]. We consider it a privelige to serve you.  Delivering excellent health care in a caring and  compassionate way is our goal.  Partnering with you,  so that together we can achieve this goal is our strategy.

## 2018-01-26 NOTE — Progress Notes (Signed)
Kelli Spencer     MRN: [id number]      DOB: 1966/10/21   HPI Kelli Spencer is here for follow up and re-evaluation of chronic medical conditions, medication management and review of any available recent lab and radiology data.  Preventive health is updated, specifically  Cancer screening and Immunization.   Denies polyuria, polydipsia, blurred vision , or hypoglycemic episodes. Does report eating increased sweets and starchy foods  And insufficiently exercise which she attributes to knee pain  The PT denies any adverse reactions to current medications since the last visit.  C/o uncontrolled reflux symptoms x 6 months, and food sticking on very high dose omeprazole , needs gI eval ROS Denies recent fever or chills. Denies sinus pressure, nasal congestion, ear pain or sore throat. Denies chest congestion, productive cough or wheezing. Denies chest pains, palpitations and leg swelling Denies abdominal pain, nausea, vomiting,diarrhea or constipation.   Denies dysuria, frequency, hesitancy or incontinence.  Denies headaches, seizures, numbness, or tingling. Denies depression, anxiety or insomnia. Denies skin break down or rash.   PE  BP 112/80   Pulse 74   Resp 16   Ht 5' (1.524 m)   Wt 191 lb (86.6 kg)   LMP 06/10/1988 (Approximate)   SpO2 96%   BMI 37.30 kg/m   Patient alert and oriented and in no cardiopulmonary distress.  HEENT: No facial asymmetry, EOMI,   oropharynx pink and moist.  Neck supple no JVD, no mass.  Chest: Clear to auscultation bilaterally.  CVS: S1, S2 no murmurs, no S3.Regular rate.  ABD: Soft non tender.   Ext: No edema  MS: Adequate ROM spine, shoulders, hips and reduced in  knees.  Skin: Intact, no ulcerations or rash noted.  Psych: Good eye contact, normal affect. Memory intact not anxious or depressed appearing.  CNS: CN 2-12 intact, power,  normal throughout.no focal deficits noted.   Assessment & Plan  Uncontrolled type 2 diabetes  mellitus with nephropathy ([place]) Uncontrolled, needs to modify diet Kelli Spencer is reminded of the importance of commitment to daily physical activity for 30 minutes or more, as able and the need to limit carbohydrate intake to 30 to 60 grams per meal to help with blood sugar control.   The need to take medication as prescribed, test blood sugar as directed, and to call between visits if there is a concern that blood sugar is uncontrolled is also discussed.   Kelli Spencer is reminded of the importance of daily foot exam, annual eye examination, and good blood sugar, blood pressure and cholesterol control.  Diabetic Labs Latest Ref Rng & Units 01/19/2018 09/22/2017 05/28/2017 04/28/2017 03/19/2017  HbA1c <5.7 % of total Hgb 8.1(H) 7.4(H) - 6.5(H) -  Microalbumin mg/dL - 0.7 <3.0(H) - -  Micro/Creat Ratio <30 mcg/mg creat - 4 <2.0 - -  Chol <200 mg/dL - 138 - - -  HDL >50 mg/dL - 41(L) - - -  Calc LDL mg/dL (calc) - 82 - - -  Triglycerides <150 mg/dL - 66 - - -  Creatinine 0.50 - 1.05 mg/dL 1.28(H) 1.24(H) - 1.40(H) 1.15(H)   BP/Weight 01/26/2018 12/16/2017 10/01/2017 09/29/2017 09/09/2017 07/01/2017 11/04/5850  Systolic BP 778 242 - 353 614 - 431  Diastolic BP 80 80 - 80 78 - 78  Wt. (Lbs) 191 197 200 198 190 195 190.5  BMI 37.3 38.47 39.06 38.67 37.11 38.08 37.2   Foot/eye exam completion dates 09/29/2017 08/21/2016  Foot Form Completion Done Done  Essential hypertension Controlled, no change in medication DASH diet and commitment to daily physical activity for a minimum of 30 minutes discussed and encouraged, as a part of hypertension management. The importance of attaining a healthy weight is also discussed.  BP/Weight 01/26/2018 12/16/2017 10/01/2017 09/29/2017 09/09/2017 07/01/2017 06/14/7516  Systolic BP 001 749 - 449 675 - 916  Diastolic BP 80 80 - 80 78 - 78  Wt. (Lbs) 191 197 200 198 190 195 190.5  BMI 37.3 38.47 39.06 38.67 37.11 38.08 37.2       Morbid obesity  (HCC) Improved Patient re-educated about  the importance of commitment to a  minimum of 150 minutes of exercise per week.  The importance of healthy food choices with portion control discussed.  Goals set by the patient for the next several months.   Weight /BMI 01/26/2018 12/16/2017 10/01/2017  WEIGHT 191 lb 197 lb 200 lb  HEIGHT 5\' 0"  5\' 0"  5\' 0"   BMI 37.3 kg/m2 38.47 kg/m2 39.06 kg/m2       Dyslipidemia, goal LDL below 100 Hyperlipidemia:Low fat diet discussed and encouraged.   Lipid Panel  Lab Results  Component Value Date   CHOL 138 09/22/2017   HDL 41 (L) 09/22/2017   LDLCALC 82 09/22/2017   TRIG 66 09/22/2017   CHOLHDL 3.4 09/22/2017   needs to increase exercise commitment to improve HDL    GERD (gastroesophageal reflux disease) Uncontrolled with dysphagia, needs GI eval and management as already on high dose PPI

## 2018-01-26 NOTE — Assessment & Plan Note (Addendum)
Uncontrolled, needs to modify diet Kelli Spencer is reminded of the importance of commitment to daily physical activity for 30 minutes or more, as able and the need to limit carbohydrate intake to 30 to 60 grams per meal to help with blood sugar control.   The need to take medication as prescribed, test blood sugar as directed, and to call between visits if there is a concern that blood sugar is uncontrolled is also discussed.   Kelli Spencer is reminded of the importance of daily foot exam, annual eye examination, and good blood sugar, blood pressure and cholesterol control.  Diabetic Labs Latest Ref Rng & Units 01/19/2018 09/22/2017 05/28/2017 04/28/2017 03/19/2017  HbA1c <5.7 % of total Hgb 8.1(H) 7.4(H) - 6.5(H) -  Microalbumin mg/dL - 0.7 <3.0(H) - -  Micro/Creat Ratio <30 mcg/mg creat - 4 <2.0 - -  Chol <200 mg/dL - 138 - - -  HDL >50 mg/dL - 41(L) - - -  Calc LDL mg/dL (calc) - 82 - - -  Triglycerides <150 mg/dL - 66 - - -  Creatinine 0.50 - 1.05 mg/dL 1.28(H) 1.24(H) - 1.40(H) 1.15(H)   BP/Weight 01/26/2018 12/16/2017 10/01/2017 09/29/2017 09/09/2017 07/01/2017 9/37/1696  Systolic BP 789 381 - 017 510 - 258  Diastolic BP 80 80 - 80 78 - 78  Wt. (Lbs) 191 197 200 198 190 195 190.5  BMI 37.3 38.47 39.06 38.67 37.11 38.08 37.2   Foot/eye exam completion dates 09/29/2017 08/21/2016  Foot Form Completion Done Done

## 2018-01-28 ENCOUNTER — Ambulatory Visit (HOSPITAL_COMMUNITY)
Admission: RE | Admit: 2018-01-28 | Discharge: 2018-01-28 | Disposition: A | Discharge status: Home / Self Care | Payer: Medicare Other | Source: Ambulatory Visit | Attending: Family Medicine | Admitting: Family Medicine

## 2018-01-28 DIAGNOSIS — Z1231 Encounter for screening mammogram for malignant neoplasm of breast: Secondary | ICD-10-CM | POA: Insufficient documentation

## 2018-01-30 ENCOUNTER — Encounter: Payer: Self-pay | Admitting: Family Medicine

## 2018-01-30 NOTE — Assessment & Plan Note (Addendum)
Hyperlipidemia:Low fat diet discussed and encouraged.   Lipid Panel  Lab Results  Component Value Date   CHOL 138 09/22/2017   HDL 41 (L) 09/22/2017   LDLCALC 82 09/22/2017   TRIG 66 09/22/2017   CHOLHDL 3.4 09/22/2017   needs to increase exercise commitment to improve HDL

## 2018-01-30 NOTE — Assessment & Plan Note (Signed)
Controlled, no change in medication DASH diet and commitment to daily physical activity for a minimum of 30 minutes discussed and encouraged, as a part of hypertension management. The importance of attaining a healthy weight is also discussed.  BP/Weight 01/26/2018 12/16/2017 10/01/2017 09/29/2017 09/09/2017 07/01/2017 8/47/8412  Systolic BP 820 813 - 887 195 - 974  Diastolic BP 80 80 - 80 78 - 78  Wt. (Lbs) 191 197 200 198 190 195 190.5  BMI 37.3 38.47 39.06 38.67 37.11 38.08 37.2

## 2018-01-30 NOTE — Assessment & Plan Note (Signed)
Improved Patient re-educated about  the importance of commitment to a  minimum of 150 minutes of exercise per week.  The importance of healthy food choices with portion control discussed.  Goals set by the patient for the next several months.   Weight /BMI 01/26/2018 12/16/2017 10/01/2017  WEIGHT 191 lb 197 lb 200 lb  HEIGHT 5\' 0"  5\' 0"  5\' 0"   BMI 37.3 kg/m2 38.47 kg/m2 39.06 kg/m2

## 2018-01-30 NOTE — Assessment & Plan Note (Signed)
Uncontrolled with dysphagia, needs GI eval and management as already on high dose PPI

## 2018-02-01 ENCOUNTER — Other Ambulatory Visit: Payer: Self-pay | Admitting: Family Medicine

## 2018-02-01 ENCOUNTER — Encounter: Payer: Self-pay | Admitting: Gastroenterology

## 2018-02-01 DIAGNOSIS — R928 Other abnormal and inconclusive findings on diagnostic imaging of breast: Secondary | ICD-10-CM

## 2018-02-02 ENCOUNTER — Ambulatory Visit (HOSPITAL_COMMUNITY)
Admission: RE | Admit: 2018-02-02 | Discharge: 2018-02-02 | Disposition: A | Discharge status: Home / Self Care | Payer: Medicare Other | Source: Ambulatory Visit | Attending: Family Medicine | Admitting: Family Medicine

## 2018-02-02 DIAGNOSIS — R928 Other abnormal and inconclusive findings on diagnostic imaging of breast: Secondary | ICD-10-CM | POA: Diagnosis not present

## 2018-02-20 ENCOUNTER — Other Ambulatory Visit: Payer: Self-pay | Admitting: Family Medicine

## 2018-03-17 ENCOUNTER — Ambulatory Visit (INDEPENDENT_AMBULATORY_CARE_PROVIDER_SITE_OTHER): Payer: Medicare Other

## 2018-03-17 ENCOUNTER — Encounter: Payer: Self-pay | Admitting: Orthopedic Surgery

## 2018-03-17 ENCOUNTER — Ambulatory Visit (INDEPENDENT_AMBULATORY_CARE_PROVIDER_SITE_OTHER): Payer: Medicare Other | Admitting: Orthopedic Surgery

## 2018-03-17 VITALS — BP 128/85 | HR 72 | Ht 60.0 in | Wt 194.0 lb

## 2018-03-17 DIAGNOSIS — Z96651 Presence of right artificial knee joint: Secondary | ICD-10-CM | POA: Diagnosis not present

## 2018-03-17 NOTE — Progress Notes (Signed)
ANNUAL FOLLOW UP FOR  Right  TKA   Chief Complaint  Patient presents with  . Routine Post Op    follow up right total knee replacement 03/18/17 patient states she is well      HPI: The patient is here for the annual  follow-up x-ray for knee replacement. The patient is not complaining of pain weakness instability or stiffness in the repaired knee.   Review of Systems  Constitutional: Negative for fever.    Past Medical History:  Diagnosis Date  . Arthritis   . Asthma   . Diabetes mellitus without complication ([place])   . Diastolic hypertension   . GERD (gastroesophageal reflux disease)   . Hyperlipidemia   . Hypertension   . Migraines   . Obesity   . Seizures ([place])    pt states she has seizures only when she gets upset. pt is taking no seizure meds     Examination of the right  KNEE  BP 128/85   Pulse 72   Ht 5' (1.524 m)   Wt 194 lb (88 kg)   LMP 06/10/1988 (Approximate)   BMI 37.89 kg/m   General the patient is normally groomed in no distress  Mood normal Affect pleasant   The patient is Awake and alert ; oriented normal   Inspection shows : incision healed nicely without erythema, no tenderness no swelling  Range of motion total range of motion is 115  Stability the knee is stable anterior to posterior as well as medial to lateral  Strength quadriceps strength is normal  Skin no erythema around the skin incision  Cardiovascular NO EDEMA   Neuro: normal sensation in the operative leg   Gait: normal expected gait without cane    Medical decision-making section  X-rays ordered with the following personal interpretation  Normal alignment without loosening , with mod patella tilt   Diagnosis  Encounter Diagnosis  Name Primary?  . S/P total knee replacement, right 03/18/17 Yes     Plan follow-up 1 year repeat x-rays

## 2018-03-31 ENCOUNTER — Ambulatory Visit: Payer: Medicare Other | Admitting: Gastroenterology

## 2018-03-31 ENCOUNTER — Telehealth: Payer: Self-pay

## 2018-03-31 ENCOUNTER — Ambulatory Visit (INDEPENDENT_AMBULATORY_CARE_PROVIDER_SITE_OTHER): Payer: Medicare Other | Admitting: Gastroenterology

## 2018-03-31 ENCOUNTER — Encounter: Payer: Self-pay | Admitting: Gastroenterology

## 2018-03-31 ENCOUNTER — Other Ambulatory Visit: Payer: Self-pay

## 2018-03-31 VITALS — BP 118/80 | HR 102 | Temp 99.0°F | Ht 60.0 in | Wt 192.4 lb

## 2018-03-31 DIAGNOSIS — R1319 Other dysphagia: Secondary | ICD-10-CM | POA: Insufficient documentation

## 2018-03-31 DIAGNOSIS — R1310 Dysphagia, unspecified: Secondary | ICD-10-CM | POA: Insufficient documentation

## 2018-03-31 DIAGNOSIS — K219 Gastro-esophageal reflux disease without esophagitis: Secondary | ICD-10-CM

## 2018-03-31 DIAGNOSIS — K5900 Constipation, unspecified: Secondary | ICD-10-CM | POA: Diagnosis not present

## 2018-03-31 HISTORY — DX: Other dysphagia: R13.19

## 2018-03-31 MED ORDER — PANTOPRAZOLE SODIUM 40 MG PO TBEC: 40.0000 mg | DELAYED_RELEASE_TABLET | Freq: Every day | ORAL | 3 refills | Status: DC

## 2018-03-31 MED ORDER — LUBIPROSTONE 24 MCG PO CAPS: 24.0000 ug | ORAL_CAPSULE | Freq: Two times a day (BID) | ORAL | 3 refills | Status: DC

## 2018-03-31 NOTE — Progress Notes (Signed)
cc'ed to pcp °

## 2018-03-31 NOTE — Telephone Encounter (Signed)
Opened in error

## 2018-03-31 NOTE — Assessment & Plan Note (Signed)
Poorly controlled GERD and complaints of esophageal dysphagia to solid food and large pills. Failed to improve on omeprazole 40mg  BID. Would offer EGD/ED in near future for further evaluation.  I have discussed the risks, alternatives, benefits with regards to but not limited to the risk of reaction to medication, bleeding, infection, perforation and the patient is agreeable to proceed. Written consent to be obtained.  Will stop omeprazole and start pantoprazole 40mg  daily.   Reinforced antireflux measures.

## 2018-03-31 NOTE — Assessment & Plan Note (Signed)
Begin amitiza 69mcg bid with food. Call if persistent problems. Next colonoscopy planned for 06/2021.

## 2018-03-31 NOTE — Progress Notes (Addendum)
Primary Care Physician:  Fayrene Helper, MD  Primary Gastroenterologist:  Barney Drain, MD REVIEWED-NO ADDITIONAL RECOMMENDATIONS.  Chief Complaint  Patient presents with  . Dysphagia    HPI:  Kelli Spencer is a 52 y.o. female here at the request of Dr. Moshe Cipro for further evaluation of GERD and dysphagia.   Patient reports several month history of poorly controlled heartburn. Sometimes taking up to three omeprazole a day and not getting relief. Has used zantac on top of that and symptoms may subside briefly. No SOB. Feels pressure like she needs to belch. No diaphoresis. Some relief at times with AlkaSeltzer. Sometimes symptoms wake her up. Feels like "I'm leaving the world". Feeling of something being in esophagus/throat, pressure happens without meals at times. Solid foods and large pills get stuck. No ibuprofen in several months but was on large doses prior to that for knee surgery and dental pain. No abd pain. Complains of constipation.  BM 1 every week or so. No melena, brbpr.   Current Outpatient Medications  Medication Sig Dispense Refill  . blood glucose meter kit and supplies Dispense based on patient and insurance preference. Once daily testing dx e11.9 1 each 0  . cholecalciferol (VITAMIN D) 400 UNITS TABS tablet Take 800 Units by mouth daily.    Marland Kitchen glipiZIDE (GLUCOTROL) 10 MG tablet TAKE 1 TABLET BY MOUTH EVERY DAY 90 tablet 0  . glucose blood (ONETOUCH VERIO) test strip Once daily testing 100 each 1  . hydrochlorothiazide (MICROZIDE) 12.5 MG capsule Take 1 capsule (12.5 mg total) by mouth daily. 90 capsule 1  . hydrOXYzine (ATARAX/VISTARIL) 50 MG tablet TAKE 1 TABLET BY MOUTH AT BEDTIME FOR ITCHING OR ANXIETY 30 tablet 1  . linagliptin (TRADJENTA) 5 MG TABS tablet TAKE 1 TABLET(5 MG) BY MOUTH DAILY 90 tablet 1  . omeprazole (PRILOSEC) 40 MG capsule Take 1 capsule (40 mg total) by mouth 2 (two) times daily. 180 capsule 1  . ONETOUCH DELICA LANCETS 79T MISC TO USE WITH ONE  TOUCH VERIO FLEX, TEST ONCE A DAY 100 each 3  . potassium chloride (K-DUR,KLOR-CON) 10 MEQ tablet Take 1 tablet (10 mEq total) by mouth daily. 90 tablet 1  . pravastatin (PRAVACHOL) 10 MG tablet TAKE 1 TABLET(10 MG) BY MOUTH DAILY 90 tablet 1  . senna (SENOKOT) 8.6 MG TABS tablet Take 1 tablet (8.6 mg total) by mouth 2 (two) times daily. 60 each 5  . tizanidine (ZANAFLEX) 2 MG capsule One  Capsule at bedtime as needed, back spasm 30 capsule 2  . aspirin EC 325 MG EC tablet Take 1 tablet (325 mg total) by mouth daily with breakfast. (Patient not taking: Reported on 03/31/2018) 30 tablet 0   No current facility-administered medications for this visit.     Allergies as of 03/31/2018 - Review Complete 03/31/2018  Allergen Reaction Noted  . Prednisone Swelling and Other (See Comments) 06/09/2013  . Amlodipine Swelling 11/05/2017  . Ace inhibitors Cough 07/04/2016  . Metformin and related Diarrhea 05/05/2016    Past Medical History:  Diagnosis Date  . Arthritis   . Asthma   . Diabetes mellitus without complication ([place])   . Diastolic hypertension   . GERD (gastroesophageal reflux disease)   . Hyperlipidemia   . Hypertension   . Migraines   . Obesity   . Seizures ([place])    pt states she has seizures only when she gets upset. pt is taking no seizure meds    Past Surgical History:  Procedure Laterality Date  .  ABDOMINAL HYSTERECTOMY  1997   fibroids  . COLONOSCOPY N/A 06/23/2016   Dr. Oneida Alar: prep not adequate to detect polyps less than 24m, redundant left colon. hemorrhoids. next TCS in 5 years.   .Marland KitchenKNEE ARTHROSCOPY WITH MEDIAL MENISECTOMY Left 02/09/2014   Procedure: KNEE ARTHROSCOPY WITH MEDIAL MENISECTOMY;  Surgeon: SCarole Civil MD;  Location: [facility];  Service: Orthopedics;  Laterality: Left;  . PARTIAL HYSTERECTOMY    . TOTAL KNEE ARTHROPLASTY Right 03/18/2017   Procedure: TOTAL KNEE ARTHROPLASTY;  Surgeon: HCarole Civil MD;  Location: [facility];  Service: Orthopedics;   Laterality: Right;    Family History  Problem Relation Age of Onset  . Kidney failure Mother   . Diabetes Mother   . Hypertension Mother   . Stroke Mother   . Diabetes Father   . Heart disease Father   . Hypertension Sister   . Colon cancer Neg Hx     Social History   Socioeconomic History  . Marital status: Divorced    Spouse name: Not on file  . Number of children: 3  . Years of education: Not on file  . Highest education level: Not on file  Occupational History  . Occupation: disabled  Social Needs  . Financial resource strain: Not very hard  . Food insecurity:    Worry: Not on file    Inability: Not on file  . Transportation needs:    Medical: Patient refused    Non-medical: Patient refused  Tobacco Use  . Smoking status: Never Smoker  . Smokeless tobacco: Never Used  Substance and Sexual Activity  . Alcohol use: No  . Drug use: No  . Sexual activity: Not Currently  Lifestyle  . Physical activity:    Days per week: Not on file    Minutes per session: Not on file  . Stress: Not on file  Relationships  . Social connections:    Talks on phone: Not on file    Gets together: Not on file    Attends religious service: Not on file    Active member of club or organization: Not on file    Attends meetings of clubs or organizations: Not on file    Relationship status: Not on file  . Intimate partner violence:    Fear of current or ex partner: Not on file    Emotionally abused: Not on file    Physically abused: Not on file    Forced sexual activity: Not on file  Other Topics Concern  . Not on file  Social History Narrative  . Not on file      ROS:  General: Negative for anorexia, weight loss, fever, chills, fatigue, weakness. Eyes: Negative for vision changes.  ENT: Negative for hoarseness,  nasal congestion. See hpi. CV: Negative for chest pain, angina, palpitations, dyspnea on exertion, peripheral edema.  Respiratory: Negative for dyspnea at rest,  dyspnea on exertion, cough, sputum, wheezing.  GI: See history of present illness. GU:  Negative for dysuria, hematuria, urinary incontinence, urinary frequency, nocturnal urination.  MS: Negative for joint pain, low back pain.  Derm: Negative for rash or itching.  Neuro: Negative for weakness, abnormal sensation, seizure, frequent headaches, memory loss, confusion.  Psych: Negative for anxiety, depression, suicidal ideation, hallucinations.  Endo: Negative for unusual weight change.  Heme: Negative for bruising or bleeding. Allergy: Negative for rash or hives.    Physical Examination:  BP 118/80   Pulse (!) 102   Temp 99 F (37.2 C) (  Oral)   Ht 5' (1.524 m)   Wt 192 lb 6.4 oz (87.3 kg)   LMP 06/10/1988 (Approximate)   BMI 37.58 kg/m    General: Well-nourished, well-developed in no acute distress.  Head: Normocephalic, atraumatic.   Eyes: Conjunctiva pink, no icterus. Mouth: Oropharyngeal mucosa moist and pink , no lesions erythema or exudate. Neck: Supple without thyromegaly, masses, or lymphadenopathy.  Lungs: Clear to auscultation bilaterally.  Heart: Regular rate and rhythm, no murmurs rubs or gallops.  Abdomen: Bowel sounds are normal, nontender, nondistended, no hepatosplenomegaly or masses, no abdominal bruits or    hernia , no rebound or guarding.   Rectal: not performed Extremities: No lower extremity edema. No clubbing or deformities.  Neuro: Alert and oriented x 4 , grossly normal neurologically.  Skin: Warm and dry, no rash or jaundice.   Psych: Alert and cooperative, normal mood and affect.  Labs: Lab Results  Component Value Date   WBC 8.2 04/28/2017   HGB 13.1 04/28/2017   HCT 41.0 04/28/2017   MCV 89.7 04/28/2017   PLT 425 (H) 04/28/2017   Lab Results  Component Value Date   CREATININE 1.28 (H) 01/19/2018   BUN 26 (H) 01/19/2018   NA 140 01/19/2018   K 5.1 01/19/2018   CL 101 01/19/2018   CO2 29 01/19/2018   Lab Results  Component Value Date    ALT 11 09/22/2017   AST 14 09/22/2017   ALKPHOS 110 04/28/2017   BILITOT 0.4 09/22/2017     Imaging Studies: Dg Knee Ap/lat W/sunrise Right  Result Date: 03/17/2018 Radiology report 3 views right knee AP lateral and patellar sunrise views of the knee FINDINGS: A total knee prosthesis is noted. It is in anatomic alignment. There is no evidence of loosening.  Patellar tilt noted with patella tilting medially Impression : Patellar tilt otherwise normal TKA  xray

## 2018-03-31 NOTE — Patient Instructions (Addendum)
1. Stop omeprazole. Start pantoprazole ONCE daily before breakfast.  2. Stop Senna. Start Amitiza one capsule once to twice daily with food for constipation.  3. Upper endoscopy as scheduled. See separate instructions.    Gastroesophageal Reflux Disease, Adult Normally, food travels down the esophagus and stays in the stomach to be digested. However, when a person has gastroesophageal reflux disease (GERD), food and stomach acid move back up into the esophagus. When this happens, the esophagus becomes sore and inflamed. Over time, GERD can create small holes (ulcers) in the lining of the esophagus. What are the causes? This condition is caused by a problem with the muscle between the esophagus and the stomach (lower esophageal sphincter, or LES). Normally, the LES muscle closes after food passes through the esophagus to the stomach. When the LES is weakened or abnormal, it does not close properly, and that allows food and stomach acid to go back up into the esophagus. The LES can be weakened by certain dietary substances, medicines, and medical conditions, including:  Tobacco use.  Pregnancy.  Having a hiatal hernia.  Heavy alcohol use.  Certain foods and beverages, such as coffee, chocolate, onions, and peppermint.  What increases the risk? This condition is more likely to develop in:  People who have an increased body weight.  People who have connective tissue disorders.  People who use NSAID medicines.  What are the signs or symptoms? Symptoms of this condition include:  Heartburn.  Difficult or painful swallowing.  The feeling of having a lump in the throat.  Abitter taste in the mouth.  Bad breath.  Having a large amount of saliva.  Having an upset or bloated stomach.  Belching.  Chest pain.  Shortness of breath or wheezing.  Ongoing (chronic) cough or a night-time cough.  Wearing away of tooth enamel.  Weight loss.  Different conditions can cause chest  pain. Make sure to see your health care provider if you experience chest pain. How is this diagnosed? Your health care provider will take a medical history and perform a physical exam. To determine if you have mild or severe GERD, your health care provider may also monitor how you respond to treatment. You may also have other tests, including:  An endoscopy toexamine your stomach and esophagus with a small camera.  A test thatmeasures the acidity level in your esophagus.  A test thatmeasures how much pressure is on your esophagus.  A barium swallow or modified barium swallow to show the shape, size, and functioning of your esophagus.  How is this treated? The goal of treatment is to help relieve your symptoms and to prevent complications. Treatment for this condition may vary depending on how severe your symptoms are. Your health care provider may recommend:  Changes to your diet.  Medicine.  Surgery.  Follow these instructions at home: Diet  Follow a diet as recommended by your health care provider. This may involve avoiding foods and drinks such as: ? Coffee and tea (with or without caffeine). ? Drinks that containalcohol. ? Energy drinks and sports drinks. ? Carbonated drinks or sodas. ? Chocolate and cocoa. ? Peppermint and mint flavorings. ? Garlic and onions. ? Horseradish. ? Spicy and acidic foods, including peppers, chili powder, curry powder, vinegar, hot sauces, and barbecue sauce. ? Citrus fruit juices and citrus fruits, such as oranges, lemons, and limes. ? Tomato-based foods, such as red sauce, chili, salsa, and pizza with red sauce. ? Fried and fatty foods, such as donuts, french fries,  potato chips, and high-fat dressings. ? High-fat meats, such as hot dogs and fatty cuts of red and white meats, such as rib eye steak, sausage, ham, and bacon. ? High-fat dairy items, such as whole milk, butter, and cream cheese.  Eat small, frequent meals instead of large  meals.  Avoid drinking large amounts of liquid with your meals.  Avoid eating meals during the 2-3 hours before bedtime.  Avoid lying down right after you eat.  Do not exercise right after you eat. General instructions  Pay attention to any changes in your symptoms.  Take over-the-counter and prescription medicines only as told by your health care provider. Do not take aspirin, ibuprofen, or other NSAIDs unless your health care provider told you to do so.  Do not use any tobacco products, including cigarettes, chewing tobacco, and e-cigarettes. If you need help quitting, ask your health care provider.  Wear loose-fitting clothing. Do not wear anything tight around your waist that causes pressure on your abdomen.  Raise (elevate) the head of your bed 6 inches (15cm).  Try to reduce your stress, such as with yoga or meditation. If you need help reducing stress, ask your health care provider.  If you are overweight, reduce your weight to an amount that is healthy for you. Ask your health care provider for guidance about a safe weight loss goal.  Keep all follow-up visits as told by your health care provider. This is important. Contact a health care provider if:  You have new symptoms.  You have unexplained weight loss.  You have difficulty swallowing, or it hurts to swallow.  You have wheezing or a persistent cough.  Your symptoms do not improve with treatment.  You have a hoarse voice. Get help right away if:  You have pain in your arms, neck, jaw, teeth, or back.  You feel sweaty, dizzy, or light-headed.  You have chest pain or shortness of breath.  You vomit and your vomit looks like blood or coffee grounds.  You faint.  Your stool is bloody or black.  You cannot swallow, drink, or eat. This information is not intended to replace advice given to you by your health care provider. Make sure you discuss any questions you have with your health care  provider. Document Released: 07/30/2005 Document Revised: 03/19/2016 Document Reviewed: 02/14/2015 Elsevier Interactive Patient Education  [employer].

## 2018-04-02 ENCOUNTER — Encounter (HOSPITAL_COMMUNITY): Payer: Self-pay | Admitting: *Deleted

## 2018-04-02 ENCOUNTER — Encounter (HOSPITAL_COMMUNITY)
Admission: RE | Disposition: A | Discharge status: Home / Self Care | Payer: Self-pay | Source: Ambulatory Visit | Attending: Gastroenterology

## 2018-04-02 ENCOUNTER — Ambulatory Visit (HOSPITAL_COMMUNITY)
Admission: RE | Admit: 2018-04-02 | Discharge: 2018-04-02 | Disposition: A | Discharge status: Home / Self Care | Payer: Medicare Other | Source: Ambulatory Visit | Attending: Gastroenterology | Admitting: Gastroenterology

## 2018-04-02 ENCOUNTER — Other Ambulatory Visit: Payer: Self-pay

## 2018-04-02 DIAGNOSIS — K2950 Unspecified chronic gastritis without bleeding: Secondary | ICD-10-CM | POA: Insufficient documentation

## 2018-04-02 DIAGNOSIS — K222 Esophageal obstruction: Secondary | ICD-10-CM | POA: Diagnosis not present

## 2018-04-02 DIAGNOSIS — E785 Hyperlipidemia, unspecified: Secondary | ICD-10-CM | POA: Insufficient documentation

## 2018-04-02 DIAGNOSIS — R1310 Dysphagia, unspecified: Secondary | ICD-10-CM | POA: Diagnosis not present

## 2018-04-02 DIAGNOSIS — I10 Essential (primary) hypertension: Secondary | ICD-10-CM | POA: Insufficient documentation

## 2018-04-02 DIAGNOSIS — K219 Gastro-esophageal reflux disease without esophagitis: Secondary | ICD-10-CM | POA: Diagnosis not present

## 2018-04-02 DIAGNOSIS — E119 Type 2 diabetes mellitus without complications: Secondary | ICD-10-CM | POA: Diagnosis not present

## 2018-04-02 DIAGNOSIS — Z7984 Long term (current) use of oral hypoglycemic drugs: Secondary | ICD-10-CM | POA: Insufficient documentation

## 2018-04-02 DIAGNOSIS — K2970 Gastritis, unspecified, without bleeding: Secondary | ICD-10-CM

## 2018-04-02 DIAGNOSIS — K317 Polyp of stomach and duodenum: Secondary | ICD-10-CM | POA: Diagnosis not present

## 2018-04-02 DIAGNOSIS — Z79899 Other long term (current) drug therapy: Secondary | ICD-10-CM | POA: Diagnosis not present

## 2018-04-02 DIAGNOSIS — Z96651 Presence of right artificial knee joint: Secondary | ICD-10-CM | POA: Diagnosis not present

## 2018-04-02 DIAGNOSIS — R1319 Other dysphagia: Secondary | ICD-10-CM

## 2018-04-02 HISTORY — PX: ESOPHAGOGASTRODUODENOSCOPY: SHX5428

## 2018-04-02 HISTORY — PX: SAVORY DILATION: SHX5439

## 2018-04-02 LAB — GLUCOSE, CAPILLARY: GLUCOSE-CAPILLARY: 112 mg/dL — AB (ref 65–99)

## 2018-04-02 SURGERY — EGD (ESOPHAGOGASTRODUODENOSCOPY)
Anesthesia: Moderate Sedation

## 2018-04-02 MED ORDER — LIDOCAINE VISCOUS HCL 2 % MT SOLN
OROMUCOSAL | Status: DC | PRN
  Administered 2018-04-02: 1 via OROMUCOSAL

## 2018-04-02 MED ORDER — ONDANSETRON HCL 4 MG/2ML IJ SOLN
4.0000 mg | Freq: Once | INTRAMUSCULAR | Status: AC
  Administered 2018-04-02: 4 mg via INTRAVENOUS

## 2018-04-02 MED ORDER — MINERAL OIL PO OIL
TOPICAL_OIL | ORAL | Status: AC
  Filled 2018-04-02: qty 30

## 2018-04-02 MED ORDER — MIDAZOLAM HCL 5 MG/5ML IJ SOLN
INTRAMUSCULAR | Status: AC
  Filled 2018-04-02: qty 10

## 2018-04-02 MED ORDER — ONDANSETRON HCL 4 MG/2ML IJ SOLN
INTRAMUSCULAR | Status: AC
  Filled 2018-04-02: qty 2

## 2018-04-02 MED ORDER — MIDAZOLAM HCL 5 MG/5ML IJ SOLN
INTRAMUSCULAR | Status: DC | PRN
  Administered 2018-04-02 (×2): 2 mg via INTRAVENOUS
  Administered 2018-04-02: 1 mg via INTRAVENOUS
  Administered 2018-04-02: 2 mg via INTRAVENOUS

## 2018-04-02 MED ORDER — LIDOCAINE VISCOUS HCL 2 % MT SOLN
OROMUCOSAL | Status: AC
  Filled 2018-04-02: qty 15

## 2018-04-02 MED ORDER — MEPERIDINE HCL 100 MG/ML IJ SOLN
INTRAMUSCULAR | Status: AC
  Filled 2018-04-02: qty 2

## 2018-04-02 MED ORDER — MEPERIDINE HCL 100 MG/ML IJ SOLN
INTRAMUSCULAR | Status: DC | PRN
  Administered 2018-04-02 (×4): 25 mg

## 2018-04-02 MED ORDER — SODIUM CHLORIDE 0.9 % IV SOLN
INTRAVENOUS | Status: DC
  Administered 2018-04-02: 1000 mL via INTRAVENOUS

## 2018-04-02 NOTE — H&P (Signed)
Primary Care Physician:  Fayrene Helper, MD Primary Gastroenterologist:  Dr. Oneida Alar  Pre-Procedure History & Physical: HPI:  Kelli Spencer is a 52 y.o. female here for [place].  Past Medical History:  Diagnosis Date  . Arthritis   . Asthma   . Diabetes mellitus without complication ([place])   . Diastolic hypertension   . GERD (gastroesophageal reflux disease)   . Hyperlipidemia   . Hypertension   . Migraines   . Obesity   . Seizures ([place])    pt states she has seizures only when she gets upset. pt is taking no seizure meds    Past Surgical History:  Procedure Laterality Date  . ABDOMINAL HYSTERECTOMY  1997   fibroids  . COLONOSCOPY N/A 06/23/2016   Dr. Oneida Alar: prep not adequate to detect polyps less than 33m, redundant left colon. hemorrhoids. next TCS in 5 years.   .Marland KitchenKNEE ARTHROSCOPY WITH MEDIAL MENISECTOMY Left 02/09/2014   Procedure: KNEE ARTHROSCOPY WITH MEDIAL MENISECTOMY;  Surgeon: SCarole Civil MD;  Location: [facility];  Service: Orthopedics;  Laterality: Left;  . PARTIAL HYSTERECTOMY    . TOTAL KNEE ARTHROPLASTY Right 03/18/2017   Procedure: TOTAL KNEE ARTHROPLASTY;  Surgeon: HCarole Civil MD;  Location: [facility];  Service: Orthopedics;  Laterality: Right;    Prior to Admission medications   Medication Sig Start Date End Date Taking? Authorizing Provider  cholecalciferol (VITAMIN D) 400 UNITS TABS tablet Take 800 Units by mouth daily.   Yes [provider]  glipiZIDE (GLUCOTROL) 10 MG tablet TAKE 1 TABLET BY MOUTH EVERY DAY 02/22/18  Yes SFayrene Helper MD  hydrochlorothiazide (MICROZIDE) 12.5 MG capsule Take 1 capsule (12.5 mg total) by mouth daily. 11/05/17  Yes SFayrene Helper MD  linagliptin (TRADJENTA) 5 MG TABS tablet TAKE 1 TABLET(5 MG) BY MOUTH DAILY 01/26/18  Yes SFayrene Helper MD  lubiprostone (AMITIZA) 24 MCG capsule Take 1 capsule (24 mcg total) by mouth 2 (two) times daily with a meal. 03/31/18  Yes LMahala Menghini PA-C   pantoprazole (PROTONIX) 40 MG tablet Take 1 tablet (40 mg total) by mouth daily before breakfast. 03/31/18  Yes LMahala Menghini PA-C  pravastatin (PRAVACHOL) 10 MG tablet TAKE 1 TABLET(10 MG) BY MOUTH DAILY 01/26/18  Yes SFayrene Helper MD  tizanidine (ZANAFLEX) 2 MG capsule One  Capsule at bedtime as needed, back spasm Patient taking differently: Take 2 mg by mouth at bedtime as needed for muscle spasms.  01/26/18  Yes SFayrene Helper MD  aspirin EC 325 MG EC tablet Take 1 tablet (325 mg total) by mouth daily with breakfast. Patient not taking: Reported on 03/31/2018 03/20/17   HCarole Civil MD  blood glucose meter kit and supplies Dispense based on patient and insurance preference. Once daily testing dx e11.9 Patient not taking: Reported on 03/31/2018 01/26/18   SFayrene Helper MD  glucose blood ([facility]) test strip Once daily testing Patient not taking: Reported on 03/31/2018 06/19/16   SFayrene Helper MD  hydrOXYzine (ATARAX/VISTARIL) 50 MG tablet TAKE 1 TABLET BY MOUTH AT BEDTIME FOR ITCHING OR ANXIETY Patient taking differently: Take 50 mg by mouth at bedtime as needed for anxiety or itching.  01/26/18   SFayrene Helper MD  [facility] 331VMISC TO USE WITH ONE TOUCH VERIO FLEX, TEST ONCE A DAY Patient not taking: Reported on 03/31/2018 09/15/16   SFayrene Helper MD  potassium chloride (K-DUR,KLOR-CON) 10 MEQ tablet Take 1 tablet (10  mEq total) by mouth daily. Patient not taking: Reported on 03/31/2018 11/05/17   Fayrene Helper, MD    Allergies as of 03/31/2018 - Review Complete 03/31/2018  Allergen Reaction Noted  . Prednisone Swelling and Other (See Comments) 06/09/2013  . Amlodipine Itching and Swelling 11/05/2017  . Ace inhibitors Cough 07/04/2016  . Metformin and related Diarrhea 05/05/2016    Family History  Problem Relation Age of Onset  . Kidney failure Mother   . Diabetes Mother   . Hypertension Mother   . Stroke Mother    . Diabetes Father   . Heart disease Father   . Hypertension Sister   . Colon cancer Neg Hx     Social History   Socioeconomic History  . Marital status: Divorced    Spouse name: Not on file  . Number of children: 3  . Years of education: Not on file  . Highest education level: Not on file  Occupational History  . Occupation: disabled  Social Needs  . Financial resource strain: Not very hard  . Food insecurity:    Worry: Not on file    Inability: Not on file  . Transportation needs:    Medical: Patient refused    Non-medical: Patient refused  Tobacco Use  . Smoking status: Never Smoker  . Smokeless tobacco: Never Used  Substance and Sexual Activity  . Alcohol use: No  . Drug use: No  . Sexual activity: Not Currently  Lifestyle  . Physical activity:    Days per week: Not on file    Minutes per session: Not on file  . Stress: Not on file  Relationships  . Social connections:    Talks on phone: Not on file    Gets together: Not on file    Attends religious service: Not on file    Active member of club or organization: Not on file    Attends meetings of clubs or organizations: Not on file    Relationship status: Not on file  . Intimate partner violence:    Fear of current or ex partner: Not on file    Emotionally abused: Not on file    Physically abused: Not on file    Forced sexual activity: Not on file  Other Topics Concern  . Not on file  Social History Narrative  . Not on file    Review of Systems: See HPI, otherwise negative ROS   Physical Exam: BP 114/69   Pulse 81   Temp 98 F (36.7 C) (Oral)   Resp 14   Ht 5' (1.524 m)   Wt 192 lb (87.1 kg)   LMP 06/10/1988 (Approximate)   SpO2 100%   BMI 37.50 kg/m  General:   Alert,  pleasant and cooperative in NAD Head:  Normocephalic and atraumatic. Neck:  Supple; Lungs:  Clear throughout to auscultation.    Heart:  Regular rate and rhythm. Abdomen:  Soft, nontender and nondistended. Normal bowel  sounds, without guarding, and without rebound.   Neurologic:  Alert and  oriented x4;  grossly normal neurologically.  Impression/Plan:     DYSPHAGIA  PLAN:  EGD/DIL TODAY. DISCUSSED PROCEDURE, BENEFITS, & RISKS: < 1% chance of medication reaction, bleeding, OR perforation.

## 2018-04-02 NOTE — Op Note (Signed)
[facility] Patient Name: Kelli Spencer Procedure Date: 04/02/2018 11:00 AM MRN: [id number] Date of Birth: [birth year] Attending MD: Barney Drain MD, MD CSN: [id number] Age: 52 Admit Type: Outpatient Procedure:                Upper GI endoscopy WITH COLD FORCEPS                            BIOPSY/ESOPHAGEAL DILATION Indications:              Dysphagia, GERD, BMI 37.5 Providers:                Barney Drain MD, MD, Janeece Riggers, RN, Nelma Rothman,                            Technician Referring MD:             Norwood Levo. Simpson MD, MD Medicines:                Ondansetron 4 mg IV, Meperidine 100 mg IV,                            Midazolam 7 mg IV Complications:            No immediate complications. Estimated Blood Loss:     Estimated blood loss was minimal. Procedure:                Pre-Anesthesia Assessment:                           - Prior to the procedure, a History and Physical                            was performed, and patient medications and                            allergies were reviewed. The patient's tolerance of                            previous anesthesia was also reviewed. The risks                            and benefits of the procedure and the sedation                            options and risks were discussed with the patient.                            All questions were answered, and informed consent                            was obtained. Prior Anticoagulants: The patient has                            taken no previous anticoagulant or antiplatelet  agents. ASA Grade Assessment: II - A patient with                            mild systemic disease. After reviewing the risks                            and benefits, the patient was deemed in                            satisfactory condition to undergo the procedure.                            After obtaining informed consent, the endoscope was                            passed  under direct vision. Throughout the                            procedure, the patient's blood pressure, pulse, and                            oxygen saturations were monitored continuously. The                            [id number] ([id number]) scope was introduced through the                            mouth, and advanced to the second part of duodenum.                            The upper GI endoscopy was accomplished without                            difficulty. The patient tolerated the procedure                            well. Scope In: 11:26:29 AM Scope Out: 11:37:19 AM Total Procedure Duration: 0 hours 10 minutes 50 seconds  Findings:      One benign-appearing, intrinsic moderate (circumferential scarring or       stenosis; an endoscope may pass) stenosis was found. This stenosis       measured 1.4 cm (inner diameter). The stenosis was traversed. A       guidewire was placed and the scope was withdrawn. Dilation was performed       with a Savary dilator with mild resistance at 15 mm, 16 mm and 17 mm.      Multiple small sessile polyps were found in the cardia and in the       gastric fundus. Biopsies were taken with a cold forceps for histology.      Patchy mild inflammation characterized by congestion (edema) and       erythema was found in the gastric fundus and in the gastric antrum.       Biopsies were taken with a cold forceps for Helicobacter pylori       testing(2: BODY,  3: ANTRUM).      The examined duodenum was normal. Impression:               - DYSPHAGIA DUE TO Benign-appearing esophageal                            STRICTURE/GERD. Dilated.                           - Multiple gastric polyps. Biopsied.                           - MILD Gastritis. Biopsied. Moderate Sedation:      Moderate (conscious) sedation was administered by the endoscopy nurse       and supervised by the endoscopist. The following parameters were       monitored: oxygen saturation, heart rate, blood  pressure, and response       to care. Total physician intraservice time was 27 minutes. Recommendation:           - Await pathology results.                           - Low fat diet. LOSE WEIGHT TO BMI < 30.                           - Continue present medications.                           - Return to my office in 4 months.                           - Patient has a contact number available for                            emergencies. The signs and symptoms of potential                            delayed complications were discussed with the                            patient. Return to normal activities tomorrow.                            Written discharge instructions were provided to the                            patient. Procedure Code(s):        --- Professional ---                           [id number], Esophagogastroduodenoscopy, flexible,                            transoral; with insertion of guide wire followed by                            passage  of dilator(s) through esophagus over guide                            wire                           43239, Esophagogastroduodenoscopy, flexible,                            transoral; with biopsy, single or multiple                           G0500, Moderate sedation services provided by the                            same physician or other qualified health care                            professional performing a gastrointestinal                            endoscopic service that sedation supports,                            requiring the presence of an independent trained                            observer to assist in the monitoring of the                            patient's level of consciousness and physiological                            status; initial 15 minutes of intra-service time;                            patient age 87 years or older (additional time may                            be reported with [phone], as appropriate)                            [phone], Moderate sedation services provided by the                            same physician or other qualified health care                            professional performing the diagnostic or                            therapeutic service that the sedation supports,                            requiring the presence of an independent trained  observer to assist in the monitoring of the                            patient's level of consciousness and physiological                            status; each additional 15 minutes intraservice                            time (List separately in addition to code for                            primary service) Diagnosis Code(s):        --- Professional ---                           K22.2, Esophageal obstruction                           K31.7, Polyp of stomach and duodenum                           K29.70, Gastritis, unspecified, without bleeding                           R13.10, Dysphagia, unspecified CPT copyright 2017 [employer]. All rights reserved. The codes documented in this report are preliminary and upon coder review may  be revised to meet current compliance requirements. Barney Drain, MD Barney Drain MD, MD 04/02/2018 11:48:31 AM This report has been signed electronically. Number of Addenda: 0

## 2018-04-08 ENCOUNTER — Encounter (HOSPITAL_COMMUNITY): Payer: Self-pay | Admitting: Gastroenterology

## 2018-04-14 ENCOUNTER — Telehealth: Payer: Self-pay | Admitting: Gastroenterology

## 2018-04-14 NOTE — Discharge Instructions (Signed)
I STRETCHED your esophagus. You have a stricture near the base of your esophagus DUE TO UNCONTROLLED REFLUX. You have SMALL STOMACH POLYPS AND mild gastritis. I biopsied your stomach.    DRINK WATER TO KEEP YOUR URINE LIGHT YELLOW.  STRICTLY AVOID REFLUX TRIGGERS. SEE INFO BELOW.  STRICTLY FOLLOW A LOW FAT DIET. SEE INFO BELOW.  CONTINUE YOUR WEIGHT LOSS EFFORTS.  WHILE I DO NOT WANT TO ALARM YOU, YOUR BODY MASS INDEX IS OVER 30 WHICH MEANS YOU ARE OBESE. OBESITY IS ASSOCIATED WITH AN INCREASED RISK FOR CIRRHOSIS AND ALL CANCERS, INCLUDING ESOPHAGEAL AND COLON CANCER. A WEIGHT OF 150 LBS OR LESS WILL KEEP YOUR BODY MASS INDEX(BMI) UNDER 30.   CONTINUE PROTONIX. TAKE 30 MINUTES PRIOR TO BREAKFAST.   YOUR BIOPSY WILL BE BACK IN 7 DAYS  FOLLOW UP IN 4 MOS.  UPPER ENDOSCOPY AFTER CARE Read the instructions outlined below and refer to this sheet in the next week. These discharge instructions provide you with general information on caring for yourself after you leave the hospital. While your treatment has been planned according to the most current medical practices available, unavoidable complications occasionally occur. If you have any problems or questions after discharge, call DR. Zafar Debrosse, [phone].  ACTIVITY  You may resume your regular activity, but move at a slower pace for the next 24 hours.   Take frequent rest periods for the next 24 hours.   Walking will help get rid of the air and reduce the bloated feeling in your belly (abdomen).   No driving for 24 hours (because of the medicine (anesthesia) used during the test).   You may shower.   Do not sign any important legal documents or operate any machinery for 24 hours (because of the anesthesia used during the test).    NUTRITION  Drink plenty of fluids.   You may resume your normal diet as instructed by your doctor.   Begin with a light meal and progress to your normal diet. Heavy or fried foods are harder to digest  and may make you feel sick to your stomach (nauseated).   Avoid alcoholic beverages for 24 hours or as instructed.    MEDICATIONS  You may resume your normal medications.   WHAT YOU CAN EXPECT TODAY  Some feelings of bloating in the abdomen.   Passage of more gas than usual.    IF YOU HAD A BIOPSY TAKEN DURING THE UPPER ENDOSCOPY:  Eat a soft diet IF YOU HAVE NAUSEA, BLOATING, ABDOMINAL PAIN, OR VOMITING.    FINDING OUT THE RESULTS OF YOUR TEST Not all test results are available during your visit. DR. Oneida Alar WILL CALL YOU WITHIN 14 DAYS OF YOUR PROCEDUE WITH YOUR RESULTS. Do not assume everything is normal if you have not heard from DR. Zula Hovsepian, CALL HER OFFICE AT [phone].  SEEK IMMEDIATE MEDICAL ATTENTION AND CALL THE OFFICE: [phone] IF:  You have more than a spotting of blood in your stool.   Your belly is swollen (abdominal distention).   You are nauseated or vomiting.   You have a temperature over 101F.   You have abdominal pain or discomfort that is severe or gets worse throughout the day.  Gastritis  Gastritis is an inflammation (the body's way of reacting to injury and/or infection) of the stomach. It is often caused by viral or bacterial (germ) infections. It can also be caused BY ASPIRIN, BC/GOODY POWDER'S, (IBUPROFEN) MOTRIN, OR ALEVE (NAPROXEN), chemicals (including alcohol), SPICY FOODS, and medications. This illness may  be associated with generalized malaise (feeling tired, not well), UPPER ABDOMINAL STOMACH cramps, and fever. One common bacterial cause of gastritis is an organism known as H. Pylori. This can be treated with antibiotics.   ESOPHAGEAL STRICTURE  Esophageal strictures can be caused by stomach acid backing up into the tube that carries food from the mouth down to the stomach (lower esophagus).  TREATMENT There are a number of  medicines used to treat reflux/stricture, including: Antacids.  Proton-pump inhibitors: PANTOPRAZOLE PEPCID  OR ZANTAC     Lifestyle and home remedies TO CONTROL REFLUX/HEARTBURN You may eliminate or reduce the frequency of heartburn by making the following lifestyle changes:   Control your weight. Being overweight is a major risk factor for heartburn and GERD. Excess pounds put pressure on your abdomen, pushing up your stomach and causing acid to back up into your esophagus.    Eat smaller meals. 4 TO 6 MEALS A DAY. This reduces pressure on the lower esophageal sphincter, helping to prevent the valve from opening and acid from washing back into your esophagus.    Loosen your belt. Clothes that fit tightly around your waist put pressure on your abdomen and the lower esophageal sphincter.    Eliminate heartburn triggers. Everyone has specific triggers. Common triggers such as fatty or fried foods, spicy food, tomato sauce, carbonated beverages, alcohol, chocolate, mint, garlic, onion, caffeine and nicotine may make heartburn worse.    Avoid stooping or bending. Tying your shoes is OK. Bending over for longer periods to weed your garden isn't, especially soon after eating.    Don't lie down after a meal. Wait at least three to four hours after eating before going to bed, and don't lie down right after eating.    Alternative medicine  Several home remedies exist for treating GERD, but they provide only temporary relief. They include drinking baking soda (sodium bicarbonate) added to water or drinking other fluids such as baking soda mixed with cream of tartar and water.   Although these liquids create temporary relief by neutralizing, washing away or buffering acids, eventually they aggravate the situation by adding gas and fluid to your stomach, increasing pressure and causing more acid reflux. Further, adding more sodium to your diet may increase your blood pressure and add stress to your heart, and excessive bicarbonate ingestion can alter the acid-base balance in your body.   Low-Fat  Diet  BREADS, CEREALS, PASTA, RICE, DRIED PEAS, AND BEANS  These products are high in carbohydrates and most are low in fat. Therefore, they can be increased in the diet as substitutes for fatty foods. They too, however, contain calories and should not be eaten in excess. Cereals can be eaten for snacks as well as for breakfast.   Include foods that contain fiber (fruits, vegetables, whole grains, and legumes). Research shows that fiber may lower blood cholesterol levels, especially the water-soluble fiber found in fruits, vegetables, oat products, and legumes.  FRUITS AND VEGETABLES  It is good to eat fruits and vegetables. Besides being sources of fiber, both are rich in vitamins and some minerals. They help you get the daily allowances of these nutrients. Fruits and vegetables can be used for snacks and desserts.  MEATS  Limit lean meat, chicken, [place], and fish to no more than 6 ounces per day.  Beef, Pork, and Lamb  Use lean cuts of beef, pork, and lamb. Lean cuts include:   Extra-lean ground beef.   Arm roast.   Sirloin tip.  Center-cut ham.   Round steak.   Loin chops.   Rump roast.   Tenderloin.   Trim all fat off the outside of meats before cooking. It is not necessary to severely decrease the intake of red meat, but lean choices should be made. Lean meat is rich in protein and contains a highly absorbable form of iron. Premenopausal women, in particular, should avoid reducing lean red meat because this could increase the risk for low red blood cells (iron-deficiency anemia).  Processed Meats  Processed meats, such as bacon, bologna, salami, sausage, and hot dogs contain large quantities of fat, are not rich in valuable nutrients, and should not be eaten very often.  Organ Meats  The organ meats, such as liver, sweetbreads, kidneys, and brain are very rich in cholesterol. They should be limited.  Chicken and [place]  These are good sources of protein. The fat  of poultry can be reduced by removing the skin and underlying fat layers before cooking. Chicken and [place] can be substituted for lean red meat in the diet. Poultry should not be fried or covered with high-fat sauces.  Fish and Shellfish  Fish is a good source of protein. Shellfish contain cholesterol, but they usually are low in saturated fatty acids. The preparation of fish is important. Like chicken and [place], they should not be fried or covered with high-fat sauces.  EGGS  Egg yolks often are hidden in cooked and processed foods. Egg whites contain no fat or cholesterol. They can be eaten often. Try 1 to 2 egg whites instead of whole eggs in recipes or use egg substitutes that do not contain yolk.  MILK AND DAIRY PRODUCTS  Use skim or 1% milk instead of 2% or whole milk. Decrease whole milk, natural, and processed cheeses. Use nonfat or low-fat (2%) cottage cheese or low-fat cheeses made from vegetable oils. Choose nonfat or low-fat (1 to 2%) yogurt. Experiment with evaporated skim milk in recipes that call for heavy cream. Substitute low-fat yogurt or low-fat cottage cheese for sour cream in dips and salad dressings. Have at least 2 servings of low-fat dairy products, such as 2 glasses of skim (or 1%) milk each day to help get your daily calcium intake.  FATS AND OILS  Reduce the total intake of fats, especially saturated fat. Butterfat, lard, and beef fats are high in saturated fat and cholesterol. These should be avoided as much as possible. Vegetable fats do not contain cholesterol, but certain vegetable fats, such as coconut oil, palm oil, and palm kernel oil are very high in saturated fats. These should be limited. These fats are often used in bakery goods, processed foods, popcorn, oils, and nondairy creamers. Vegetable shortenings and some peanut butters contain hydrogenated oils, which are also saturated fats. Read the labels on these foods and check for saturated vegetable  oils.  Unsaturated vegetable oils and fats do not raise blood cholesterol. However, they should be limited because they are fats and are high in calories. Total fat should still be limited to 30% of your daily caloric intake. Desirable liquid vegetable oils are corn oil, cottonseed oil, olive oil, canola oil, safflower oil, soybean oil, and sunflower oil. Peanut oil is not as good, but small amounts are acceptable. Buy a heart-healthy tub margarine that has no partially hydrogenated oils in the ingredients. Mayonnaise and salad dressings often are made from unsaturated fats, but they should also be limited because of their high calorie and fat content.  Seeds, nuts, peanut butter,  olives, and avocados are high in fat, but the fat is mainly the unsaturated type. These foods should be limited mainly to avoid excess calories and fat.  OTHER EATING TIPS  Snacks   Most sweets should be limited as snacks. They tend to be rich in calories and fats, and their caloric content outweighs their nutritional value. Some good choices in snacks are graham crackers, melba toast, soda crackers, bagels (no egg), English muffins, fruits, and vegetables. These snacks are preferable to snack crackers, [place] fries, and chips. Popcorn should be air-popped or cooked in small amounts of liquid vegetable oil.  Desserts  Eat fruit, low-fat yogurt, and fruit ices instead of pastries, cake, and cookies. Sherbet, angel food cake, gelatin dessert, frozen low-fat yogurt, or other frozen products that do not contain saturated fat (pure fruit juice bars, frozen ice pops) are also acceptable.   COOKING METHODS  Choose those methods that use little or no fat. They include:  Poaching.   Braising.   Steaming.   Grilling.   Baking.   Stir-frying.   Broiling.   Microwaving.   Foods can be cooked in a nonstick pan without added fat, or use a nonfat cooking spray in regular cookware. Limit fried foods and avoid frying in  saturated fat. Add moisture to lean meats by using water, broth, cooking wines, and other nonfat or low-fat sauces along with the cooking methods mentioned above.  Soups and stews should be chilled after cooking. The fat that forms on top after a few hours in the refrigerator should be skimmed off. When preparing meals, avoid using excess salt. Salt can contribute to raising blood pressure in some people.  EATING AWAY FROM HOME  Order entres, potatoes, and vegetables without sauces or butter. When meat exceeds the size of a deck of cards (3 to 4 ounces), the rest can be taken home for another meal.  Choose vegetable or fruit salads and ask for low-calorie salad dressings to be served on the side. Use dressings sparingly. Limit high-fat toppings, such as bacon, crumbled eggs, cheese, sunflower seeds, and olives. Ask for heart-healthy tub margarine instead of butter.

## 2018-04-14 NOTE — Telephone Encounter (Signed)
LMOM to call.

## 2018-04-14 NOTE — Telephone Encounter (Signed)
Please call pt. HER stomach Bx shows mild gastritis AND BENIGN POLYPS REMOVED FROM HER STOMACH.    DRINK WATER TO KEEP YOUR URINE LIGHT YELLOW.  STRICTLY AVOID REFLUX TRIGGERS.   STRICTLY FOLLOW A LOW FAT DIET.  CONTINUE YOUR WEIGHT LOSS EFFORTS.  A WEIGHT OF 150 LBS OR LESS WILL KEEP YOUR BODY MASS INDEX(BMI) UNDER 30.   CONTINUE PROTONIX. TAKE 30 MINUTES PRIOR TO BREAKFAST.  FOLLOW UP IN 4 MOS E30 DYSPHAGIA/GERD.

## 2018-04-15 NOTE — Telephone Encounter (Signed)
PATIENT SCHEDULED  °

## 2018-04-15 NOTE — Telephone Encounter (Signed)
PT is aware.

## 2018-04-21 ENCOUNTER — Other Ambulatory Visit: Payer: Self-pay | Admitting: Family Medicine

## 2018-05-01 ENCOUNTER — Other Ambulatory Visit: Payer: Self-pay | Admitting: Family Medicine

## 2018-05-26 DIAGNOSIS — E1169 Type 2 diabetes mellitus with other specified complication: Secondary | ICD-10-CM | POA: Diagnosis not present

## 2018-05-27 LAB — CBC
HCT: 37 % (ref 35.0–45.0)
Hemoglobin: 12.6 g/dL (ref 11.7–15.5)
MCH: 29.2 pg (ref 27.0–33.0)
MCHC: 34.1 g/dL (ref 32.0–36.0)
MCV: 85.8 fL (ref 80.0–100.0)
MPV: 11.2 fL (ref 7.5–12.5)
PLATELETS: 313 10*3/uL (ref 140–400)
RBC: 4.31 10*6/uL (ref 3.80–5.10)
RDW: 12.6 % (ref 11.0–15.0)
WBC: 6 10*3/uL (ref 3.8–10.8)

## 2018-05-27 LAB — BASIC METABOLIC PANEL WITH GFR
BUN / CREAT RATIO: 20 (calc) (ref 6–22)
BUN: 25 mg/dL (ref 7–25)
CHLORIDE: 101 mmol/L (ref 98–110)
CO2: 30 mmol/L (ref 20–32)
Calcium: 10 mg/dL (ref 8.6–10.4)
Creat: 1.22 mg/dL — ABNORMAL HIGH (ref 0.50–1.05)
GFR, Est African American: 59 mL/min/{1.73_m2} — ABNORMAL LOW (ref >=60)
GFR, Est Non African American: 51 mL/min/{1.73_m2} — ABNORMAL LOW (ref >=60)
GLUCOSE: 170 mg/dL — AB (ref 65–99)
Potassium: 4.6 mmol/L (ref 3.5–5.3)
SODIUM: 140 mmol/L (ref 135–146)

## 2018-05-27 LAB — HEMOGLOBIN A1C
HEMOGLOBIN A1C: 8.4 %{Hb} — AB (ref <5.7)
Mean Plasma Glucose: 194 (calc)
eAG (mmol/L): 10.8 (calc)

## 2018-06-01 ENCOUNTER — Ambulatory Visit (INDEPENDENT_AMBULATORY_CARE_PROVIDER_SITE_OTHER): Payer: Medicare Other | Admitting: Family Medicine

## 2018-06-01 ENCOUNTER — Encounter: Payer: Self-pay | Admitting: Family Medicine

## 2018-06-01 ENCOUNTER — Other Ambulatory Visit: Payer: Self-pay

## 2018-06-01 VITALS — BP 120/70 | HR 80 | Ht 60.0 in | Wt 196.1 lb

## 2018-06-01 DIAGNOSIS — Z1211 Encounter for screening for malignant neoplasm of colon: Secondary | ICD-10-CM

## 2018-06-01 DIAGNOSIS — IMO0002 Reserved for concepts with insufficient information to code with codable children: Secondary | ICD-10-CM

## 2018-06-01 DIAGNOSIS — E1121 Type 2 diabetes mellitus with diabetic nephropathy: Secondary | ICD-10-CM | POA: Diagnosis not present

## 2018-06-01 DIAGNOSIS — Z0000 Encounter for general adult medical examination without abnormal findings: Secondary | ICD-10-CM | POA: Diagnosis not present

## 2018-06-01 DIAGNOSIS — E785 Hyperlipidemia, unspecified: Secondary | ICD-10-CM

## 2018-06-01 DIAGNOSIS — E1165 Type 2 diabetes mellitus with hyperglycemia: Secondary | ICD-10-CM

## 2018-06-01 DIAGNOSIS — B3789 Other sites of candidiasis: Secondary | ICD-10-CM | POA: Diagnosis not present

## 2018-06-01 DIAGNOSIS — I10 Essential (primary) hypertension: Secondary | ICD-10-CM

## 2018-06-01 DIAGNOSIS — B369 Superficial mycosis, unspecified: Secondary | ICD-10-CM | POA: Insufficient documentation

## 2018-06-01 MED ORDER — GLIPIZIDE 5 MG PO TABS: ORAL_TABLET | ORAL | 3 refills | Status: DC

## 2018-06-01 MED ORDER — FLUCONAZOLE 150 MG PO TABS: ORAL_TABLET | ORAL | 0 refills | Status: DC

## 2018-06-01 MED ORDER — CLOTRIMAZOLE-BETAMETHASONE 1-0.05 % EX CREA: TOPICAL_CREAM | CUTANEOUS | 0 refills | Status: DC

## 2018-06-01 NOTE — Assessment & Plan Note (Signed)
Annual exam as documented. Counseling done  re healthy lifestyle involving commitment to 150 minutes exercise per week, heart healthy diet, and attaining healthy weight.The importance of adequate sleep also discussed. Regular seat belt use and home safety, is also discussed. Changes in health habits are decided on by the patient with goals and time frames  set for achieving them. Immunization and cancer screening needs are specifically addressed at this visit. Still refusing vaccines

## 2018-06-01 NOTE — Patient Instructions (Addendum)
F/U in 3.5 months, call if you need me before   Please return stool cards as soon as possible  Increase dose of glipizide since your blood sugar is uncontrolled and change eating .  New is glipizide 5 mg one at dinner . Continue glipizide 10 mg one at breakfast and also tragenta one daily  Test fasting blood sugar every day, this should be between 80 to 130  Fasting labs 5 days before next visit, lipid, cmp and eGFR , hBA1C and microalb  Thanks for choosing [facility], we consider it a privelige to serve you. It is important that you exercise regularly at least 30 minutes 5 times a week. If you develop chest pain, have severe difficulty breathing, or feel very tired, stop exercising immediately and seek medical attention

## 2018-06-01 NOTE — Progress Notes (Signed)
Kelli Spencer     MRN: [id number]      DOB: 1966-08-06  HPI: Patient is in for annual physical exam. Diabetes is uncontrolled and med is adjusted , nutrition counselling also done C/o perianal itching and  Vaginal itching over the past 4 months  Recent labs, if available are reviewed. Immunization is reviewed , and  updated if needed.   PE: BP 120/70 (BP Location: Left Arm, Patient Position: Sitting, Cuff Size: Large)   Pulse 80   Ht 5' (1.524 m)   Wt 196 lb 1.3 oz (88.9 kg)   LMP 06/10/1988 (Approximate)   SpO2 99%   BMI 38.29 kg/m   Pleasant  female, alert and oriented x 3, in no cardio-pulmonary distress. Afebrile. HEENT No facial trauma or asymetry. Sinuses non tender.  Extra occullar muscles intact, . External ears normal, tympanic membranes clear. Oropharynx moist, no exudate. Neck: supple, no adenopathy,JVD or thyromegaly.No bruits.  Chest: Clear to ascultation bilaterally.No crackles or wheezes. Non tender to palpation  Breast: No asymetry,no masses or lumps. No tenderness. No nipple discharge or inversion. No axillary or supraclavicular adenopathy  Cardiovascular system; Heart sounds normal,  S1 and  S2 ,no S3.  No murmur, or thrill. Apical beat not displaced Peripheral pulses normal.  Abdomen: Soft, non tender, no organomegaly or masses. No bruits. Bowel sounds normal. No guarding, tenderness or rebound.  Rectal:  Candidiasis and fungal infection in perianal area GU: Asymptomatic not examined.   Musculoskeletal exam: Decreased  ROM of spine, hips , shoulders and knees.  deformity ,swelling and  crepitus noted. No muscle wasting or atrophy.   Neurologic: Cranial nerves 2 to 12 intact. Power, tone ,sensation and reflexes normal throughout. disturbance in gait.noted.  No tremor.  Skin: Intact, peri anal rash noted. Vitiligo noted  Psych; Normal mood and affect. Judgement and concentration normal   Assessment & Plan:  Annual  physical exam Annual exam as documented. Counseling done  re healthy lifestyle involving commitment to 150 minutes exercise per week, heart healthy diet, and attaining healthy weight.The importance of adequate sleep also discussed. Regular seat belt use and home safety, is also discussed. Changes in health habits are decided on by the patient with goals and time frames  set for achieving them. Immunization and cancer screening needs are specifically addressed at this visit. Still refusing vaccines  Uncontrolled type 2 diabetes mellitus with nephropathy ([name]) Uncontrolled and deteriorated [name] is reminded of the importance of commitment to daily physical activity for 30 minutes or more, as able and the need to limit carbohydrate intake to 30 to 60 grams per meal to help with blood sugar control.   The need to take medication as prescribed, test blood sugar as directed, and to call between visits if there is a concern that blood sugar is uncontrolled is also discussed.   [name] is reminded of the importance of daily foot exam, annual eye examination, and good blood sugar, blood pressure and cholesterol control.  Diabetic Labs Latest Ref Rng & Units 05/26/2018 01/19/2018 09/22/2017 05/28/2017 04/28/2017  HbA1c <5.7 % of total Hgb 8.4(H) 8.1(H) 7.4(H) - 6.5(H)  Microalbumin mg/dL - - 0.7 <3.0(H) -  Micro/Creat Ratio <30 mcg/mg creat - - 4 <2.0 -  Chol <200 mg/dL - - 138 - -  HDL >50 mg/dL - - 41(L) - -  Calc LDL mg/dL (calc) - - 82 - -  Triglycerides <150 mg/dL - - 66 - -  Creatinine 0.50 - 1.05 mg/dL  1.22(H) 1.28(H) 1.24(H) - 1.40(H)   BP/Weight 06/01/2018 04/02/2018 03/31/2018 03/17/2018 01/26/2018 12/16/2017 54/65/6812  Systolic BP 751 99 700 174 944 967 -  Diastolic BP 70 68 80 85 80 80 -  Wt. (Lbs) 196.08 192 192.4 194 191 197 200  BMI 38.29 37.5 37.58 37.89 37.3 38.47 39.06   Foot/eye exam completion dates 09/29/2017 08/21/2016  Foot Form Completion Done Done     Increase dose of  glipizide to 15 mg daily totoal, pt to start testing daily and change diet also    Candidiasis of anus Oral fluconazole prescribed  Dermatomycosis Topical clotrimazole / betamethasone twice daily for 1 week , then as needed perianally

## 2018-06-05 DIAGNOSIS — B3789 Other sites of candidiasis: Secondary | ICD-10-CM | POA: Insufficient documentation

## 2018-06-05 NOTE — Assessment & Plan Note (Signed)
Uncontrolled and deteriorated Kelli Spencer is reminded of the importance of commitment to daily physical activity for 30 minutes or more, as able and the need to limit carbohydrate intake to 30 to 60 grams per meal to help with blood sugar control.   The need to take medication as prescribed, test blood sugar as directed, and to call between visits if there is a concern that blood sugar is uncontrolled is also discussed.   Kelli Spencer is reminded of the importance of daily foot exam, annual eye examination, and good blood sugar, blood pressure and cholesterol control.  Diabetic Labs Latest Ref Rng & Units 05/26/2018 01/19/2018 09/22/2017 05/28/2017 04/28/2017  HbA1c <5.7 % of total Hgb 8.4(H) 8.1(H) 7.4(H) - 6.5(H)  Microalbumin mg/dL - - 0.7 <3.0(H) -  Micro/Creat Ratio <30 mcg/mg creat - - 4 <2.0 -  Chol <200 mg/dL - - 138 - -  HDL >50 mg/dL - - 41(L) - -  Calc LDL mg/dL (calc) - - 82 - -  Triglycerides <150 mg/dL - - 66 - -  Creatinine 0.50 - 1.05 mg/dL 1.22(H) 1.28(H) 1.24(H) - 1.40(H)   BP/Weight 06/01/2018 04/02/2018 03/31/2018 03/17/2018 01/26/2018 12/16/2017 18/59/0931  Systolic BP 121 99 624 469 507 225 -  Diastolic BP 70 68 80 85 80 80 -  Wt. (Lbs) 196.08 192 192.4 194 191 197 200  BMI 38.29 37.5 37.58 37.89 37.3 38.47 39.06   Foot/eye exam completion dates 09/29/2017 08/21/2016  Foot Form Completion Done Done     Increase dose of glipizide to 15 mg daily totoal, pt to start testing daily and change diet also

## 2018-06-05 NOTE — Assessment & Plan Note (Signed)
Oral fluconazole prescribed

## 2018-06-05 NOTE — Assessment & Plan Note (Signed)
Topical clotrimazole / betamethasone twice daily for 1 week , then as needed perianally

## 2018-06-10 ENCOUNTER — Other Ambulatory Visit: Payer: Self-pay | Admitting: Family Medicine

## 2018-06-11 ENCOUNTER — Other Ambulatory Visit (INDEPENDENT_AMBULATORY_CARE_PROVIDER_SITE_OTHER): Payer: Medicare Other

## 2018-06-11 DIAGNOSIS — Z1211 Encounter for screening for malignant neoplasm of colon: Secondary | ICD-10-CM

## 2018-06-11 LAB — HEMOCCULT GUIAC POC 1CARD (OFFICE)
Card #2 Fecal Occult Blod, POC: NEGATIVE
FECAL OCCULT BLD: NEGATIVE
FECAL OCCULT BLD: NEGATIVE

## 2018-06-14 LAB — HM DIABETES EYE EXAM

## 2018-06-23 ENCOUNTER — Telehealth: Payer: Self-pay | Admitting: Family Medicine

## 2018-06-23 NOTE — Telephone Encounter (Signed)
No redness seen just puffy underneath her eyes and she said they feel irritated and they it comes and goes. Wants something sent in. Her last eye exam was 6 months ago

## 2018-06-23 NOTE — Telephone Encounter (Signed)
New Message  Pt walked in stating her eyes are swollen and if she can be prescribed a rx for her eyes.  Pt verbalized she recently had eye exam where they came out to her.  Please f/u

## 2018-06-28 NOTE — Telephone Encounter (Signed)
May use artificial tears which is OTC for irritated eyes, helps with the irritation which is often due to dry eyes For puffiness under her eyes there is no medication I recommend Please tell her it is important to get enough sleep as lack of sleep may be the cause, also if she has allergy symptoms like stuffy nose and sneezing , puffiness may be reduced if she take daily or every other day allergy tablets , this may reduce the puffiness under her eyes

## 2018-06-30 NOTE — Telephone Encounter (Signed)
Pt aware.

## 2018-07-06 ENCOUNTER — Ambulatory Visit (INDEPENDENT_AMBULATORY_CARE_PROVIDER_SITE_OTHER): Payer: Medicare Other | Admitting: Gastroenterology

## 2018-07-06 ENCOUNTER — Encounter: Payer: Self-pay | Admitting: Gastroenterology

## 2018-07-06 VITALS — BP 112/75 | HR 68 | Temp 98.5°F | Ht 60.0 in | Wt 194.6 lb

## 2018-07-06 DIAGNOSIS — K219 Gastro-esophageal reflux disease without esophagitis: Secondary | ICD-10-CM | POA: Diagnosis not present

## 2018-07-06 DIAGNOSIS — K5900 Constipation, unspecified: Secondary | ICD-10-CM | POA: Diagnosis not present

## 2018-07-06 MED ORDER — PANTOPRAZOLE SODIUM 40 MG PO TBEC: 40.0000 mg | DELAYED_RELEASE_TABLET | Freq: Two times a day (BID) | ORAL | 3 refills | Status: DC

## 2018-07-06 NOTE — Assessment & Plan Note (Signed)
Doing much better on Amitiza 24 mcg twice daily.  Continue current regimen.  Next colonoscopy planned for August 2022.

## 2018-07-06 NOTE — Patient Instructions (Signed)
1. Increase pantoprazole to 30 minutes before breakfast and 30 minutes before your evening meal.  New prescription sent to your pharmacy.  Let me know if you continue to have nighttime symptoms. 2. Continue Amitiza twice daily with food. 3. Return to the office in 6 months or call sooner if needed.

## 2018-07-06 NOTE — Progress Notes (Signed)
Primary Care Physician: Fayrene Helper, MD  Primary Gastroenterologist:  Barney Drain, MD   Chief Complaint  Patient presents with  . Gastroesophageal Reflux    f/u, doing ok    HPI: Kelli Spencer is a 52 y.o. female here for follow-up.  She has a history of GERD and dysphagia.  History of constipation as well.  Next colonoscopy planned for August 2022.  She had a EGD back in May for refractory GERD/dysphagia.  She had a benign esophageal stricture status post dilation, gastritis without H. pylori, benign fundic gland gastric polyps.  For the most part she feels remarkably better.  2-3 times a week she has to take Alka-Seltzer at night for breakthrough heartburn.  Her swallowing is 100% improved.  No abdominal pain.  Bowel movements significantly improved on Amitiza.  No blood in the stool or melena.     Current Outpatient Medications  Medication Sig Dispense Refill  . ACCU-CHEK AVIVA PLUS test strip USE TO TEST BLOOD SUGAR EVERY DAY 50 each 6  . ACCU-CHEK SOFTCLIX LANCETS lancets USE TO TEST BLOOD SUGAR EVERY DAY 100 each 0  . blood glucose meter kit and supplies Dispense based on patient and insurance preference. Once daily testing dx e11.9 1 each 0  . cholecalciferol (VITAMIN D) 400 UNITS TABS tablet Take 800 Units by mouth daily.    . clotrimazole-betamethasone (LOTRISONE) cream Apply cream twice daily to rash for 1 week, then as needed 45 g 0  . glipiZIDE (GLUCOTROL) 10 MG tablet TAKE 1 TABLET BY MOUTH EVERY DAY 90 tablet 0  . glipiZIDE (GLUCOTROL) 5 MG tablet Take 1 tablet every evening at dinnertime 90 tablet 3  . hydrochlorothiazide (MICROZIDE) 12.5 MG capsule TAKE 1 CAPSULE BY MOUTH DAILY 90 capsule 0  . hydrOXYzine (ATARAX/VISTARIL) 50 MG tablet TAKE 1 TABLET BY MOUTH AT BEDTIME FOR ITCHING OR ANXIETY (Patient taking differently: Take 50 mg by mouth at bedtime as needed for anxiety or itching. ) 30 tablet 1  . linagliptin (TRADJENTA) 5 MG TABS tablet TAKE 1  TABLET(5 MG) BY MOUTH DAILY 90 tablet 1  . lubiprostone (AMITIZA) 24 MCG capsule Take 1 capsule (24 mcg total) by mouth 2 (two) times daily with a meal. 180 capsule 3  . pantoprazole (PROTONIX) 40 MG tablet Take 1 tablet (40 mg total) by mouth daily before breakfast. 90 tablet 3  . potassium chloride (K-DUR,KLOR-CON) 10 MEQ tablet TAKE 1 TABLET(10 MEQ) BY MOUTH DAILY 90 tablet 0  . pravastatin (PRAVACHOL) 10 MG tablet TAKE 1 TABLET(10 MG) BY MOUTH DAILY 90 tablet 1  . tizanidine (ZANAFLEX) 2 MG capsule One  Capsule at bedtime as needed, back spasm (Patient taking differently: Take 2 mg by mouth at bedtime as needed for muscle spasms. ) 30 capsule 2  . fluconazole (DIFLUCAN) 150 MG tablet Take one tablet by mouth once daily for 2 days (Patient not taking: Reported on 07/06/2018) 2 tablet 0   No current facility-administered medications for this visit.     Allergies as of 07/06/2018 - Review Complete 07/06/2018  Allergen Reaction Noted  . Prednisone Swelling and Other (See Comments) 06/09/2013  . Amlodipine Itching and Swelling 11/05/2017  . Ace inhibitors Cough 07/04/2016  . Metformin and related Diarrhea 05/05/2016    ROS:  General: Negative for anorexia, weight loss, fever, chills, fatigue, weakness. ENT: Negative for hoarseness, difficulty swallowing , nasal congestion. CV: Negative for chest pain, angina, palpitations, dyspnea on exertion, peripheral edema.  Respiratory: Negative for  dyspnea at rest, dyspnea on exertion, cough, sputum, wheezing.  GI: See history of present illness. GU:  Negative for dysuria, hematuria, urinary incontinence, urinary frequency, nocturnal urination.  Endo: Negative for unusual weight change.    Physical Examination:   BP 112/75   Pulse 68   Temp 98.5 F (36.9 C) (Oral)   Ht 5' (1.524 m)   Wt 194 lb 9.6 oz (88.3 kg)   LMP 06/10/1988 (Approximate)   BMI 38.01 kg/m   General: Well-nourished, well-developed in no acute distress.  Eyes: No  icterus. Mouth: Oropharyngeal mucosa moist and pink , no lesions erythema or exudate. Lungs: Clear to auscultation bilaterally.  Heart: Regular rate and rhythm, no murmurs rubs or gallops.  Abdomen: Bowel sounds are normal, nontender, nondistended, no hepatosplenomegaly or masses, no abdominal bruits or hernia , no rebound or guarding.   Extremities: No lower extremity edema. No clubbing or deformities. Neuro: Alert and oriented x 4   Skin: Warm and dry, no jaundice.   Psych: Alert and cooperative, normal mood and affect.

## 2018-07-06 NOTE — Assessment & Plan Note (Signed)
Doing much better but continues to have some nocturnal symptoms.  Increase pantoprazole to 40 mg 30 minutes before breakfast and 30 minutes before her evening meal.  Call with persistent symptoms.  Otherwise we will see her back in 6 months.

## 2018-07-06 NOTE — Progress Notes (Signed)
CC'D TO PCP °

## 2018-07-16 ENCOUNTER — Other Ambulatory Visit: Payer: Self-pay | Admitting: Family Medicine

## 2018-07-31 ENCOUNTER — Other Ambulatory Visit: Payer: Self-pay | Admitting: Family Medicine

## 2018-09-07 DIAGNOSIS — E785 Hyperlipidemia, unspecified: Secondary | ICD-10-CM | POA: Diagnosis not present

## 2018-09-07 DIAGNOSIS — E1121 Type 2 diabetes mellitus with diabetic nephropathy: Secondary | ICD-10-CM | POA: Diagnosis not present

## 2018-09-07 DIAGNOSIS — I10 Essential (primary) hypertension: Secondary | ICD-10-CM | POA: Diagnosis not present

## 2018-09-07 DIAGNOSIS — E1165 Type 2 diabetes mellitus with hyperglycemia: Secondary | ICD-10-CM | POA: Diagnosis not present

## 2018-09-08 LAB — LIPID PANEL
Cholesterol: 135 mg/dL (ref <200)
HDL: 42 mg/dL — AB (ref >50)
LDL Cholesterol (Calc): 80 mg/dL (calc)
Non-HDL Cholesterol (Calc): 93 mg/dL (calc) (ref <130)
TRIGLYCERIDES: 57 mg/dL (ref <150)
Total CHOL/HDL Ratio: 3.2 (calc) (ref <5.0)

## 2018-09-08 LAB — COMPLETE METABOLIC PANEL WITH GFR
AG Ratio: 1.2 (calc) (ref 1.0–2.5)
ALT: 13 U/L (ref 6–29)
AST: 13 U/L (ref 10–35)
Albumin: 4.5 g/dL (ref 3.6–5.1)
Alkaline phosphatase (APISO): 127 U/L (ref 33–130)
BUN/Creatinine Ratio: 20 (calc) (ref 6–22)
BUN: 27 mg/dL — ABNORMAL HIGH (ref 7–25)
CALCIUM: 10.4 mg/dL (ref 8.6–10.4)
CO2: 28 mmol/L (ref 20–32)
CREATININE: 1.38 mg/dL — AB (ref 0.50–1.05)
Chloride: 100 mmol/L (ref 98–110)
GFR, EST NON AFRICAN AMERICAN: 43 mL/min/{1.73_m2} — AB (ref >=60)
GFR, Est African American: 50 mL/min/{1.73_m2} — ABNORMAL LOW (ref >=60)
GLUCOSE: 152 mg/dL — AB (ref 65–99)
Globulin: 3.7 g/dL (calc) (ref 1.9–3.7)
Potassium: 4.9 mmol/L (ref 3.5–5.3)
Sodium: 139 mmol/L (ref 135–146)
Total Bilirubin: 0.4 mg/dL (ref 0.2–1.2)
Total Protein: 8.2 g/dL — ABNORMAL HIGH (ref 6.1–8.1)

## 2018-09-08 LAB — HEMOGLOBIN A1C
Hgb A1c MFr Bld: 7.7 % of total Hgb — ABNORMAL HIGH (ref <5.7)
Mean Plasma Glucose: 174 (calc)
eAG (mmol/L): 9.7 (calc)

## 2018-09-08 LAB — MICROALBUMIN, URINE: MICROALB UR: 0.9 mg/dL

## 2018-09-14 ENCOUNTER — Ambulatory Visit (INDEPENDENT_AMBULATORY_CARE_PROVIDER_SITE_OTHER): Payer: Medicare Other | Admitting: Family Medicine

## 2018-09-14 ENCOUNTER — Encounter: Payer: Self-pay | Admitting: Family Medicine

## 2018-09-14 VITALS — BP 130/80 | HR 72 | Resp 16 | Ht 60.0 in | Wt 186.0 lb

## 2018-09-14 DIAGNOSIS — E1121 Type 2 diabetes mellitus with diabetic nephropathy: Secondary | ICD-10-CM | POA: Diagnosis not present

## 2018-09-14 DIAGNOSIS — E785 Hyperlipidemia, unspecified: Secondary | ICD-10-CM

## 2018-09-14 DIAGNOSIS — E559 Vitamin D deficiency, unspecified: Secondary | ICD-10-CM

## 2018-09-14 DIAGNOSIS — I10 Essential (primary) hypertension: Secondary | ICD-10-CM

## 2018-09-14 NOTE — Patient Instructions (Addendum)
Wellness with nurse end Feb  CONGRATULATIONS on I proved sugar and weight loss, goal is 7 or less  Please reconsider vaccines  MD follow up in 4 months, call if you need me before   Non fasting chem 7 and EGFr and HBa1C and TSH and vit D  It is important that you exercise regularly at least 30 minutes 5 times a week. If you develop chest pain, have severe difficulty breathing, or feel very tired, stop exercising immediately and seek medical attention    Goal for fasting blood sugar ranges from 80 to 120 and 2 hours after any meal or at bedtime should be between 130 to 170.

## 2018-09-18 ENCOUNTER — Other Ambulatory Visit: Payer: Self-pay | Admitting: Family Medicine

## 2018-09-18 DIAGNOSIS — E785 Hyperlipidemia, unspecified: Secondary | ICD-10-CM

## 2018-09-22 ENCOUNTER — Other Ambulatory Visit: Payer: Self-pay | Admitting: Family Medicine

## 2018-09-22 DIAGNOSIS — E1169 Type 2 diabetes mellitus with other specified complication: Secondary | ICD-10-CM

## 2018-09-22 DIAGNOSIS — E669 Obesity, unspecified: Principal | ICD-10-CM

## 2018-09-29 ENCOUNTER — Encounter: Payer: Self-pay | Admitting: Family Medicine

## 2018-09-29 NOTE — Progress Notes (Signed)
CINTHYA BORS     MRN: [id number]      DOB: 1966-02-16   HPI Ms. Preston is here for follow up and re-evaluation of chronic medical conditions, medication management and review of any available recent lab and radiology data.  Preventive health is updated, specifically  Cancer screening and Immunization.   Questions or concerns regarding consultations or procedures which the PT has had in the interim are  addressed. The PT denies any adverse reactions to current medications since the last visit.  There are no new concerns.  There are no specific complaints   ROS Denies recent fever or chills. Denies sinus pressure, nasal congestion, ear pain or sore throat. Denies chest congestion, productive cough or wheezing. Denies chest pains, palpitations and leg swelling Denies abdominal pain, nausea, vomiting,diarrhea or constipation.   Denies dysuria, frequency, hesitancy or incontinence. Denies joint pain, swelling and limitation in mobility. Denies headaches, seizures, numbness, or tingling. Denies depression, anxiety or insomnia. Denies skin break down or rash.   PE  BP 130/80   Pulse 72   Resp 16   Ht 5' (1.524 m)   Wt 186 lb (84.4 kg)   LMP 06/10/1988 (Approximate)   SpO2 99%   BMI 36.33 kg/m   Patient alert and oriented and in no cardiopulmonary distress.  HEENT: No facial asymmetry, EOMI,   oropharynx pink and moist.  Neck supple no JVD, no mass.  Chest: Clear to auscultation bilaterally.  CVS: S1, S2 no murmurs, no S3.Regular rate.  ABD: Soft non tender.   Ext: No edema  MS: Adequate ROM spine, shoulders, hips and knees.  Skin: Intact, no ulcerations or rash noted.  Psych: Good eye contact, normal affect. Memory intact not anxious or depressed appearing.  CNS: CN 2-12 intact, power,  normal throughout.no focal deficits noted.   Assessment & Plan  Essential hypertension Controlled, no change in medication DASH diet and commitment to daily physical activity  for a minimum of 30 minutes discussed and encouraged, as a part of hypertension management. The importance of attaining a healthy weight is also discussed.  BP/Weight 09/14/2018 07/06/2018 06/01/2018 04/02/2018 03/31/2018 03/17/2018 05/18/9677  Systolic BP 938 101 751 99 025 852 778  Diastolic BP 80 75 70 68 80 85 80  Wt. (Lbs) 186 194.6 196.08 192 192.4 194 191  BMI 36.33 38.01 38.29 37.5 37.58 37.89 37.3       Dyslipidemia, goal LDL below 100 Hyperlipidemia:Low fat diet discussed and encouraged.   Lipid Panel  Lab Results  Component Value Date   CHOL 135 09/07/2018   HDL 42 (L) 09/07/2018   LDLCALC 80 09/07/2018   TRIG 57 09/07/2018   CHOLHDL 3.2 09/07/2018    Needs to commit to regular exercise to improve HDL   Uncontrolled type 2 diabetes mellitus with nephropathy ([place]) Ms. Waldvogel is reminded of the importance of commitment to daily physical activity for 30 minutes or more, as able and the need to limit carbohydrate intake to 30 to 60 grams per meal to help with blood sugar control.   The need to take medication as prescribed, test blood sugar as directed, and to call between visits if there is a concern that blood sugar is uncontrolled is also discussed.   Ms. Glassco is reminded of the importance of daily foot exam, annual eye examination, and good blood sugar, blood pressure and cholesterol control. Improved no medication change  Diabetic Labs Latest Ref Rng & Units 09/07/2018 05/26/2018 01/19/2018 09/22/2017 05/28/2017  HbA1c <5.7 %  of total Hgb 7.7(H) 8.4(H) 8.1(H) 7.4(H) -  Microalbumin mg/dL 0.9 - - 0.7 <3.0(H)  Micro/Creat Ratio <30 mcg/mg creat - - - 4 <2.0  Chol <200 mg/dL 135 - - 138 -  HDL >50 mg/dL 42(L) - - 41(L) -  Calc LDL mg/dL (calc) 80 - - 82 -  Triglycerides <150 mg/dL 57 - - 66 -  Creatinine 0.50 - 1.05 mg/dL 1.38(H) 1.22(H) 1.28(H) 1.24(H) -   BP/Weight 09/14/2018 07/06/2018 06/01/2018 04/02/2018 03/31/2018 03/17/2018 3/47/4259  Systolic BP 563 875 643 99 118  329 518  Diastolic BP 80 75 70 68 80 85 80  Wt. (Lbs) 186 194.6 196.08 192 192.4 194 191  BMI 36.33 38.01 38.29 37.5 37.58 37.89 37.3   Foot/eye exam completion dates Latest Ref Rng & Units 06/14/2018 06/01/2018  Eye Exam No Retinopathy No Retinopathy -  Foot Form Completion - - Done        Morbid obesity ([place]) Improved Patient re-educated about  the importance of commitment to a  minimum of 150 minutes of exercise per week.  The importance of healthy food choices with portion control discussed. Encouraged to start a food diary, count calories and to consider  joining a support group. Sample diet sheets offered. Goals set by the patient for the next several months.   Weight /BMI 09/14/2018 07/06/2018 06/01/2018  WEIGHT 186 lb 194 lb 9.6 oz 196 lb 1.3 oz  HEIGHT 5\' 0"  5\' 0"  5\' 0"   BMI 36.33 kg/m2 38.01 kg/m2 38.29 kg/m2

## 2018-09-29 NOTE — Assessment & Plan Note (Signed)
Hyperlipidemia:Low fat diet discussed and encouraged.   Lipid Panel  Lab Results  Component Value Date   CHOL 135 09/07/2018   HDL 42 (L) 09/07/2018   LDLCALC 80 09/07/2018   TRIG 57 09/07/2018   CHOLHDL 3.2 09/07/2018    Needs to commit to regular exercise to improve HDL

## 2018-09-29 NOTE — Assessment & Plan Note (Signed)
Improved Patient re-educated about  the importance of commitment to a  minimum of 150 minutes of exercise per week.  The importance of healthy food choices with portion control discussed. Encouraged to start a food diary, count calories and to consider  joining a support group. Sample diet sheets offered. Goals set by the patient for the next several months.   Weight /BMI 09/14/2018 07/06/2018 06/01/2018  WEIGHT 186 lb 194 lb 9.6 oz 196 lb 1.3 oz  HEIGHT 5\' 0"  5\' 0"  5\' 0"   BMI 36.33 kg/m2 38.01 kg/m2 38.29 kg/m2

## 2018-09-29 NOTE — Assessment & Plan Note (Signed)
Controlled, no change in medication DASH diet and commitment to daily physical activity for a minimum of 30 minutes discussed and encouraged, as a part of hypertension management. The importance of attaining a healthy weight is also discussed.  BP/Weight 09/14/2018 07/06/2018 06/01/2018 04/02/2018 03/31/2018 03/17/2018 0/13/1438  Systolic BP 887 579 728 99 206 015 615  Diastolic BP 80 75 70 68 80 85 80  Wt. (Lbs) 186 194.6 196.08 192 192.4 194 191  BMI 36.33 38.01 38.29 37.5 37.58 37.89 37.3

## 2018-09-29 NOTE — Assessment & Plan Note (Signed)
Ms. Alkhatib is reminded of the importance of commitment to daily physical activity for 30 minutes or more, as able and the need to limit carbohydrate intake to 30 to 60 grams per meal to help with blood sugar control.   The need to take medication as prescribed, test blood sugar as directed, and to call between visits if there is a concern that blood sugar is uncontrolled is also discussed.   Ms. Genao is reminded of the importance of daily foot exam, annual eye examination, and good blood sugar, blood pressure and cholesterol control. Improved no medication change  Diabetic Labs Latest Ref Rng & Units 09/07/2018 05/26/2018 01/19/2018 09/22/2017 05/28/2017  HbA1c <5.7 % of total Hgb 7.7(H) 8.4(H) 8.1(H) 7.4(H) -  Microalbumin mg/dL 0.9 - - 0.7 <3.0(H)  Micro/Creat Ratio <30 mcg/mg creat - - - 4 <2.0  Chol <200 mg/dL 135 - - 138 -  HDL >50 mg/dL 42(L) - - 41(L) -  Calc LDL mg/dL (calc) 80 - - 82 -  Triglycerides <150 mg/dL 57 - - 66 -  Creatinine 0.50 - 1.05 mg/dL 1.38(H) 1.22(H) 1.28(H) 1.24(H) -   BP/Weight 09/14/2018 07/06/2018 06/01/2018 04/02/2018 03/31/2018 03/17/2018 0/22/3361  Systolic BP 224 497 530 99 051 102 111  Diastolic BP 80 75 70 68 80 85 80  Wt. (Lbs) 186 194.6 196.08 192 192.4 194 191  BMI 36.33 38.01 38.29 37.5 37.58 37.89 37.3   Foot/eye exam completion dates Latest Ref Rng & Units 06/14/2018 06/01/2018  Eye Exam No Retinopathy No Retinopathy -  Foot Form Completion - - Done

## 2018-10-11 ENCOUNTER — Ambulatory Visit (INDEPENDENT_AMBULATORY_CARE_PROVIDER_SITE_OTHER): Payer: Medicare Other | Admitting: Family Medicine

## 2018-10-11 ENCOUNTER — Encounter: Payer: Self-pay | Admitting: Family Medicine

## 2018-10-11 DIAGNOSIS — E790 Hyperuricemia without signs of inflammatory arthritis and tophaceous disease: Secondary | ICD-10-CM

## 2018-10-11 DIAGNOSIS — M10072 Idiopathic gout, left ankle and foot: Secondary | ICD-10-CM

## 2018-10-11 DIAGNOSIS — I10 Essential (primary) hypertension: Secondary | ICD-10-CM | POA: Diagnosis not present

## 2018-10-11 DIAGNOSIS — E1021 Type 1 diabetes mellitus with diabetic nephropathy: Secondary | ICD-10-CM | POA: Diagnosis not present

## 2018-10-11 DIAGNOSIS — M109 Gout, unspecified: Secondary | ICD-10-CM | POA: Insufficient documentation

## 2018-10-11 MED ORDER — INDOMETHACIN 50 MG PO CAPS: ORAL_CAPSULE | ORAL | 0 refills | Status: AC

## 2018-10-11 NOTE — Patient Instructions (Signed)
F/U as before , call if you need me sooner  You are treated for acute gout of left foot and left great toe.Marland Kitchen   Uric acid level in lab today  Indomethacin is prescribed to treat the pain,  Take as directed, start today  Thank you  for choosing [facility]. We consider it a privelige to serve you.  Delivering excellent health care in a caring and  compassionate way is our goal.  Partnering with you,  so that together we can achieve this goal is our strategy.

## 2018-10-11 NOTE — Assessment & Plan Note (Signed)
6 day h/o acute left foot and great toe pain, no trauma , has had problems  In the past Indomethacin prescribed,, and uric acid level to be checked today

## 2018-10-12 ENCOUNTER — Other Ambulatory Visit: Payer: Self-pay | Admitting: Family Medicine

## 2018-10-12 ENCOUNTER — Encounter: Payer: Self-pay | Admitting: Family Medicine

## 2018-10-12 DIAGNOSIS — E790 Hyperuricemia without signs of inflammatory arthritis and tophaceous disease: Secondary | ICD-10-CM | POA: Insufficient documentation

## 2018-10-12 LAB — URIC ACID: Uric Acid, Serum: 9.3 mg/dL — ABNORMAL HIGH (ref 2.5–7.0)

## 2018-10-12 MED ORDER — ALLOPURINOL 300 MG PO TABS: 300.0000 mg | ORAL_TABLET | Freq: Every day | ORAL | 6 refills | Status: DC

## 2018-10-12 NOTE — Assessment & Plan Note (Signed)
Deteriorated. Patient re-educated about  the importance of commitment to a  minimum of 150 minutes of exercise per week.  The importance of healthy food choices with portion control discussed. Encouraged to start a food diary, count calories and to consider  joining a support group. Sample diet sheets offered. Goals set by the patient for the next several months.   Weight /BMI 10/11/2018 09/14/2018 07/06/2018  WEIGHT 195 lb 186 lb 194 lb 9.6 oz  HEIGHT 5\' 0"  5\' 0"  5\' 0"   BMI 38.08 kg/m2 36.33 kg/m2 38.01 kg/m2

## 2018-10-12 NOTE — Assessment & Plan Note (Signed)
left foot and great toe pain and swelling x 6 days, no trauma. Indomethacin x 6 days in tapered doses , check uric acid level, if elevated, she will need to commit to daily allopurinol

## 2018-10-12 NOTE — Assessment & Plan Note (Signed)
Kelli Spencer is reminded of the importance of commitment to daily physical activity for 30 minutes or more, as able and the need to limit carbohydrate intake to 30 to 60 grams per meal to help with blood sugar control.   The need to take medication as prescribed, test blood sugar as directed, and to call between visits if there is a concern that blood sugar is uncontrolled is also discussed.   Kelli Spencer is reminded of the importance of daily foot exam, annual eye examination, and good blood sugar, blood pressure and cholesterol control.  Diabetic Labs Latest Ref Rng & Units 09/07/2018 05/26/2018 01/19/2018 09/22/2017 05/28/2017  HbA1c <5.7 % of total Hgb 7.7(H) 8.4(H) 8.1(H) 7.4(H) -  Microalbumin mg/dL 0.9 - - 0.7 <3.0(H)  Micro/Creat Ratio <30 mcg/mg creat - - - 4 <2.0  Chol <200 mg/dL 135 - - 138 -  HDL >50 mg/dL 42(L) - - 41(L) -  Calc LDL mg/dL (calc) 80 - - 82 -  Triglycerides <150 mg/dL 57 - - 66 -  Creatinine 0.50 - 1.05 mg/dL 1.38(H) 1.22(H) 1.28(H) 1.24(H) -   BP/Weight 10/11/2018 09/14/2018 07/06/2018 06/01/2018 04/02/2018 03/31/2018 7/34/0370  Systolic BP 964 383 818 403 99 754 360  Diastolic BP 78 80 75 70 68 80 85  Wt. (Lbs) 195 186 194.6 196.08 192 192.4 194  BMI 38.08 36.33 38.01 38.29 37.5 37.58 37.89   Foot/eye exam completion dates Latest Ref Rng & Units 06/14/2018 06/01/2018  Eye Exam No Retinopathy No Retinopathy -  Foot Form Completion - - Done

## 2018-10-12 NOTE — Assessment & Plan Note (Signed)
Controlled, no change in medication DASH diet and commitment to daily physical activity for a minimum of 30 minutes discussed and encouraged, as a part of hypertension management. The importance of attaining a healthy weight is also discussed.  BP/Weight 10/11/2018 09/14/2018 07/06/2018 06/01/2018 04/02/2018 03/31/2018 0/34/7425  Systolic BP 956 387 564 332 99 951 884  Diastolic BP 78 80 75 70 68 80 85  Wt. (Lbs) 195 186 194.6 196.08 192 192.4 194  BMI 38.08 36.33 38.01 38.29 37.5 37.58 37.89

## 2018-10-12 NOTE — Progress Notes (Signed)
STARLYNN KLINKNER     MRN: [id number]      DOB: 08-25-1966   HPI Ms. Blanchard is herewith a 6 day h/o pain and swelling of left foot and great toe, no trauma to toe, has had gout in the past. She is unaware of eating any foods in excess to trigger the attack.Pain rated at a 9  ROS Denies recent fever or chills. Denies sinus pressure, nasal congestion, ear pain or sore throat. Denies chest congestion, productive cough or wheezing. Denies chest pains, palpitations and leg swelling Denies abdominal pain, nausea, vomiting,diarrhea or constipation.   Denies dysuria, frequency, hesitancy or incontinence. Denies headaches, seizures, numbness, or tingling. Denies depression, anxiety or insomnia. Denies skin break down or rash.   PE  BP 100/78   Pulse 75   Resp 12   Ht 5' (1.524 m)   Wt 195 lb (88.5 kg)   LMP 06/10/1988 (Approximate)   SpO2 94%   BMI 38.08 kg/m   Patient alert and oriented and in no cardiopulmonary distress.  HEENT: No facial asymmetry, EOMI,   oropharynx pink and moist.  Neck supple no JVD, no mass.  Chest: Clear to auscultation bilaterally.  CVS: S1, S2 no murmurs, no S3.Regular rate.  ABD: Soft non tender.   Ext: No edema  MS: Adequate ROM spine, shoulders, hips and knees.left foot swollen and tender , in particular at base of left great toe  Skin: Intact, no ulcerations or rash noted.  Psych: Good eye contact, normal affect. Memory intact not anxious or depressed appearing.  CNS: CN 2-12 intact, power,  normal throughout.no focal deficits noted.   Assessment & Plan  Gout 6 day h/o acute left foot and great toe pain, no trauma , has had problems  In the past Indomethacin prescribed,, and uric acid level to be checked today  Gout attack left foot and great toe pain and swelling x 6 days, no trauma. Indomethacin x 6 days in tapered doses , check uric acid level, if elevated, she will need to commit to daily allopurinol  Essential  hypertension Controlled, no change in medication DASH diet and commitment to daily physical activity for a minimum of 30 minutes discussed and encouraged, as a part of hypertension management. The importance of attaining a healthy weight is also discussed.  BP/Weight 10/11/2018 09/14/2018 07/06/2018 06/01/2018 04/02/2018 03/31/2018 8/54/6270  Systolic BP 350 093 818 299 99 371 696  Diastolic BP 78 80 75 70 68 80 85  Wt. (Lbs) 195 186 194.6 196.08 192 192.4 194  BMI 38.08 36.33 38.01 38.29 37.5 37.58 37.89       Type 1 diabetes mellitus with diabetic nephropathy ([place]) Ms. Boody is reminded of the importance of commitment to daily physical activity for 30 minutes or more, as able and the need to limit carbohydrate intake to 30 to 60 grams per meal to help with blood sugar control.   The need to take medication as prescribed, test blood sugar as directed, and to call between visits if there is a concern that blood sugar is uncontrolled is also discussed.   Ms. Kosta is reminded of the importance of daily foot exam, annual eye examination, and good blood sugar, blood pressure and cholesterol control.  Diabetic Labs Latest Ref Rng & Units 09/07/2018 05/26/2018 01/19/2018 09/22/2017 05/28/2017  HbA1c <5.7 % of total Hgb 7.7(H) 8.4(H) 8.1(H) 7.4(H) -  Microalbumin mg/dL 0.9 - - 0.7 <3.0(H)  Micro/Creat Ratio <30 mcg/mg creat - - - 4 <2.0  Chol <200 mg/dL 135 - - 138 -  HDL >50 mg/dL 42(L) - - 41(L) -  Calc LDL mg/dL (calc) 80 - - 82 -  Triglycerides <150 mg/dL 57 - - 66 -  Creatinine 0.50 - 1.05 mg/dL 1.38(H) 1.22(H) 1.28(H) 1.24(H) -   BP/Weight 10/11/2018 09/14/2018 07/06/2018 06/01/2018 04/02/2018 03/31/2018 3/64/3837  Systolic BP 793 968 864 847 99 207 218  Diastolic BP 78 80 75 70 68 80 85  Wt. (Lbs) 195 186 194.6 196.08 192 192.4 194  BMI 38.08 36.33 38.01 38.29 37.5 37.58 37.89   Foot/eye exam completion dates Latest Ref Rng & Units 06/14/2018 06/01/2018  Eye Exam No Retinopathy No Retinopathy -   Foot Form Completion - - Done        Morbid obesity ([facility]) Deteriorated. Patient re-educated about  the importance of commitment to a  minimum of 150 minutes of exercise per week.  The importance of healthy food choices with portion control discussed. Encouraged to start a food diary, count calories and to consider  joining a support group. Sample diet sheets offered. Goals set by the patient for the next several months.   Weight /BMI 10/11/2018 09/14/2018 07/06/2018  WEIGHT 195 lb 186 lb 194 lb 9.6 oz  HEIGHT 5\' 0"  5\' 0"  5\' 0"   BMI 38.08 kg/m2 36.33 kg/m2 38.01 kg/m2

## 2018-10-12 NOTE — Assessment & Plan Note (Signed)
Will start daily allopurinol on completion of indomethacin

## 2018-10-27 ENCOUNTER — Other Ambulatory Visit: Payer: Self-pay | Admitting: Family Medicine

## 2018-11-03 ENCOUNTER — Other Ambulatory Visit: Payer: Self-pay | Admitting: Family Medicine

## 2018-11-04 DIAGNOSIS — H524 Presbyopia: Secondary | ICD-10-CM | POA: Diagnosis not present

## 2018-11-04 DIAGNOSIS — H2513 Age-related nuclear cataract, bilateral: Secondary | ICD-10-CM | POA: Diagnosis not present

## 2018-11-04 DIAGNOSIS — H5203 Hypermetropia, bilateral: Secondary | ICD-10-CM | POA: Diagnosis not present

## 2018-11-04 DIAGNOSIS — H52223 Regular astigmatism, bilateral: Secondary | ICD-10-CM | POA: Diagnosis not present

## 2018-11-04 LAB — HM DIABETES EYE EXAM

## 2018-11-16 ENCOUNTER — Telehealth: Payer: Self-pay

## 2018-11-16 NOTE — Telephone Encounter (Signed)
Spoke with patient about test strip recall, she knew about this from her pharmacy, was given a new bottle of strips

## 2018-11-25 DIAGNOSIS — H5213 Myopia, bilateral: Secondary | ICD-10-CM | POA: Diagnosis not present

## 2018-12-01 ENCOUNTER — Other Ambulatory Visit: Payer: Self-pay | Admitting: Family Medicine

## 2018-12-01 ENCOUNTER — Telehealth: Payer: Self-pay

## 2018-12-01 DIAGNOSIS — M10072 Idiopathic gout, left ankle and foot: Secondary | ICD-10-CM

## 2018-12-01 MED ORDER — INDOMETHACIN 50 MG PO CAPS: ORAL_CAPSULE | ORAL | 0 refills | Status: DC

## 2018-12-01 NOTE — Telephone Encounter (Signed)
Kelli Spencer called and reports that she is having an attack of the gout in her right foot this time, before she was having gout in her left foot and you prescribed her something for it. She can barely walk now because of so much pain in the left foot, can you call her in a refill of the medication you prescribed before? Or something to help? Thank-you

## 2018-12-01 NOTE — Telephone Encounter (Signed)
Pls let her know that I sent in the indomethacin, she is to make sure that she drinks a lot of water Pls remind and review with her the fact that she should be taking allopurinol to lower the uric acid, if not, resend please  Also pls add uric acid level to lab order she already has  For her next appt with me  Remind her to avoid / reduce intake of all foods which trigger the gout Thanks!

## 2018-12-01 NOTE — Telephone Encounter (Signed)
Uric acid lab ordered to be drawn before next visit, patient called, she is aware

## 2018-12-01 NOTE — Telephone Encounter (Signed)
Patient called , she is aware, uric acid lab ordered

## 2018-12-13 DIAGNOSIS — H524 Presbyopia: Secondary | ICD-10-CM | POA: Diagnosis not present

## 2018-12-13 DIAGNOSIS — H52223 Regular astigmatism, bilateral: Secondary | ICD-10-CM | POA: Diagnosis not present

## 2018-12-24 ENCOUNTER — Other Ambulatory Visit: Payer: Self-pay | Admitting: Family Medicine

## 2018-12-24 DIAGNOSIS — E1169 Type 2 diabetes mellitus with other specified complication: Secondary | ICD-10-CM

## 2018-12-24 DIAGNOSIS — E669 Obesity, unspecified: Principal | ICD-10-CM

## 2018-12-27 ENCOUNTER — Ambulatory Visit (INDEPENDENT_AMBULATORY_CARE_PROVIDER_SITE_OTHER): Payer: Medicare Other

## 2018-12-27 VITALS — BP 118/84 | HR 91 | Resp 15 | Ht 60.0 in | Wt 193.0 lb

## 2018-12-27 DIAGNOSIS — Z0000 Encounter for general adult medical examination without abnormal findings: Secondary | ICD-10-CM | POA: Diagnosis not present

## 2018-12-27 NOTE — Progress Notes (Signed)
Subjective:   Kelli Spencer is a 53 y.o. female who presents for Medicare Annual (Subsequent) preventive examination.  Review of Systems:   Cardiac Risk Factors include: diabetes mellitus;dyslipidemia;hypertension;obesity (BMI >30kg/m2);sedentary lifestyle     Objective:     Vitals: BP 118/84   Pulse 91   Resp 15   Ht 5' (1.524 m)   Wt 193 lb (87.5 kg)   LMP 06/10/1988 (Approximate)   SpO2 95%   BMI 37.69 kg/m   Body mass index is 37.69 kg/m.  Advanced Directives 12/27/2018 04/02/2018 12/16/2017 04/27/2017 04/24/2017 04/07/2017 03/18/2017  Does Patient Have a Medical Advance Directive? _0  No No  Would patient like information on creating a medical advance directive? Yes (ED - Information included in AVS) Yes (MAU/Ambulatory/Procedural Areas - Information given) Yes (ED - Information included in AVS) - - - No - Patient declined  Pre-existing out of facility DNR order (yellow form or pink MOST form) - - - - - - -    Tobacco Social History   Tobacco Use  Smoking Status Never Smoker  Smokeless Tobacco Never Used     Counseling given: Not Answered   Clinical Intake:  Pre-visit preparation completed: Yes  Pain : No/denies pain Pain Score: 0-No pain     Nutritional Status: BMI > 30  Obese Diabetes: Yes CBG done?: No Did pt. bring in CBG monitor from home?: No  How often do you need to have someone help you when you read instructions, pamphlets, or other written materials from your doctor or pharmacy?: 2 - Rarely What is the last grade level you completed in school?: 9th grade  Interpreter Needed?: No  Information entered by :: Sarissa Dern  Past Medical History:  Diagnosis Date  . Arthritis   . Asthma   . Diabetes mellitus without complication ([place])   . Diastolic hypertension   . GERD (gastroesophageal reflux disease)   . Hyperlipidemia   . Hypertension   . Migraines   . Obesity   . Seizures ([place])    pt states she has seizures only when she  gets upset. pt is taking no seizure meds   Past Surgical History:  Procedure Laterality Date  . ABDOMINAL HYSTERECTOMY  1997   fibroids  . COLONOSCOPY N/A 06/23/2016   Dr. Oneida Alar: prep not adequate to detect polyps less than 48m, redundant left colon. hemorrhoids. next TCS in 5 years.   . ESOPHAGOGASTRODUODENOSCOPY N/A 04/02/2018   Dr. fOneida Alar Benign-appearing esophageal stricture status post dilation, multiple benign gastric polyps, mild gastritis no H. pylori.  .Marland KitchenKNEE ARTHROSCOPY WITH MEDIAL MENISECTOMY Left 02/09/2014   Procedure: KNEE ARTHROSCOPY WITH MEDIAL MENISECTOMY;  Surgeon: SCarole Civil MD;  Location: [facility];  Service: Orthopedics;  Laterality: Left;  . PARTIAL HYSTERECTOMY    . SAVORY DILATION N/A 04/02/2018   Procedure: SAVORY DILATION;  Surgeon: FDanie Binder MD;  Location: [facility];  Service: Endoscopy;  Laterality: N/A;  . TOTAL KNEE ARTHROPLASTY Right 03/18/2017   Procedure: TOTAL KNEE ARTHROPLASTY;  Surgeon: HCarole Civil MD;  Location: [facility];  Service: Orthopedics;  Laterality: Right;   Family History  Problem Relation Age of Onset  . Kidney failure Mother   . Diabetes Mother   . Hypertension Mother   . Stroke Mother   . Diabetes Father   . Heart disease Father   . Hypertension Sister   . Colon cancer Neg Hx    Social History   Socioeconomic History  .  Marital status: Divorced    Spouse name: Not on file  . Number of children: 3  . Years of education: Not on file  . Highest education level: 9th grade  Occupational History  . Occupation: disabled  Social Needs  . Financial resource strain: Not very hard  . Food insecurity:    Worry: Never true    Inability: Never true  . Transportation needs:    Medical: No    Non-medical: No  Tobacco Use  . Smoking status: Never Smoker  . Smokeless tobacco: Never Used  Substance and Sexual Activity  . Alcohol use: No  . Drug use: No  . Sexual activity: Not Currently  Lifestyle  . Physical  activity:    Days per week: 0 days    Minutes per session: 0 min  . Stress: To some extent  Relationships  . Social connections:    Talks on phone: More than three times a week    Gets together: More than three times a week    Attends religious service: More than 4 times per year    Active member of club or organization: Yes    Attends meetings of clubs or organizations: 1 to 4 times per year    Relationship status: Divorced  Other Topics Concern  . Not on file  Social History Narrative   Daughter currently living with her until she can get her own place.     Outpatient Encounter Medications as of 12/27/2018  Medication Sig  . ACCU-CHEK AVIVA PLUS test strip USE TO TEST BLOOD SUGAR EVERY DAY  . ACCU-CHEK SOFTCLIX LANCETS lancets USE TO TEST BLOOD SUGAR EVERY DAY  . allopurinol (ZYLOPRIM) 300 MG tablet Take 1 tablet (300 mg total) by mouth daily.  . blood glucose meter kit and supplies Dispense based on patient and insurance preference. Once daily testing dx e11.9  . cholecalciferol (VITAMIN D) 400 UNITS TABS tablet Take 800 Units by mouth daily.  . clotrimazole-betamethasone (LOTRISONE) cream Apply cream twice daily to rash for 1 week, then as needed  . glipiZIDE (GLUCOTROL) 10 MG tablet TAKE 1 TABLET BY MOUTH EVERY DAY  . glipiZIDE (GLUCOTROL) 5 MG tablet Take 1 tablet every evening at dinnertime  . hydrochlorothiazide (MICROZIDE) 12.5 MG capsule TAKE 1 CAPSULE BY MOUTH DAILY  . hydrOXYzine (ATARAX/VISTARIL) 50 MG tablet TAKE 1 TABLET BY MOUTH AT BEDTIME FOR ITCHING OR ANXIETY (Patient taking differently: Take 50 mg by mouth at bedtime as needed for anxiety or itching. )  . lubiprostone (AMITIZA) 24 MCG capsule Take 1 capsule (24 mcg total) by mouth 2 (two) times daily with a meal.  . pantoprazole (PROTONIX) 40 MG tablet Take 1 tablet (40 mg total) by mouth 2 (two) times daily before a meal.  . potassium chloride (K-DUR,KLOR-CON) 10 MEQ tablet TAKE 1 TABLET(10 MEQ) BY MOUTH DAILY  .  pravastatin (PRAVACHOL) 10 MG tablet TAKE 1 TABLET(10 MG) BY MOUTH DAILY  . tizanidine (ZANAFLEX) 2 MG capsule One  Capsule at bedtime as needed, back spasm (Patient taking differently: Take 2 mg by mouth at bedtime as needed for muscle spasms. )  . TRADJENTA 5 MG TABS tablet TAKE 1 TABLET(5 MG) BY MOUTH DAILY  . [DISCONTINUED] indomethacin (INDOCIN) 50 MG capsule TAKE 1 CAPSULE BY MOUTH THREE TIMES DAILY FOR 2 DAYS, 1 CAPSULE TWICE DAILY FOR 2 DAYS, 1 CAPSULE EVERY DAY FOR 2 DAYS THEN STOP   No facility-administered encounter medications on file as of 12/27/2018.     Activities of  Daily Living In your present state of health, do you have any difficulty performing the following activities: 12/27/2018  Hearing? N  Vision? N  Difficulty concentrating or making decisions? N  Walking or climbing stairs? Y  Comment when she is having knee pain or gout flare   Dressing or bathing? N  Doing errands, shopping? N  Preparing Food and eating ? N  Using the Toilet? N  In the past six months, have you accidently leaked urine? N  Do you have problems with loss of bowel control? N  Managing your Medications? N  Managing your Finances? N  Housekeeping or managing your Housekeeping? N  Some recent data might be hidden    Patient Care Team: Fayrene Helper, MD as PCP - General (Family Medicine) Carole Civil, MD as Consulting Physician (Orthopedic Surgery) Arlana Lindau, RD as Dietitian (Nutrition) Danie Binder, MD as Consulting Physician (Gastroenterology)    Assessment:   This is a routine wellness examination for [place].  Exercise Activities and Dietary recommendations Current Exercise Habits: The patient does not participate in regular exercise at present, Exercise limited by: orthopedic condition(s)  Goals    . DIET - EAT MORE FRUITS AND VEGETABLES     Follow the low purine diet     . Increase physical activity     Try to walk 30 minutes after dinner 2 evenings a week         Fall Risk Fall Risk  12/27/2018 10/11/2018 09/14/2018 06/01/2018 12/16/2017  Falls in the past year? 0 0 0 No No  Number falls in past yr: 0 0 - - -  Injury with Fall? 0 0 - - -   Is the patient's home free of loose throw rugs in walkways, pet beds, electrical cords, etc?   yes      Grab bars in the bathroom? yes      Handrails on the stairs?   yes      Adequate lighting?   yes  Timed Get Up and Go performed: get up and go performed in 5 seconds   Depression Screen PHQ 2/9 Scores 12/27/2018 10/11/2018 09/14/2018 06/01/2018  PHQ - 2 Score 0 0 1 0  PHQ- 9 Score - 0 4 -     Cognitive Function     6CIT Screen 12/27/2018  What Year? 0 points  What month? 0 points  What time? 0 points  Count back from 20 0 points  Months in reverse 0 points  Repeat phrase 0 points  Total Score 0     There is no immunization history on file for this patient.  Qualifies for Shingles Vaccine? Ask insurance if covered   Screening Tests Health Maintenance  Topic Date Due  . TETANUS/TDAP  01/13/2019 (Originally 08/03/1983)  . INFLUENZA VACCINE  03/04/2019 (Originally 06/03/2018)  . PNEUMOCOCCAL POLYSACCHARIDE VACCINE AGE 56-64 HIGH RISK  10/15/2022 (Originally 08/02/1966)  . HEMOGLOBIN A1C  03/08/2019  . FOOT EXAM  06/06/2019  . COLON CANCER SCREENING ANNUAL FOBT  06/12/2019  . URINE MICROALBUMIN  09/08/2019  . OPHTHALMOLOGY EXAM  11/05/2019  . MAMMOGRAM  01/29/2020  . PAP SMEAR-Modifier  05/27/2020  . COLONOSCOPY  06/23/2026  . Hepatitis C Screening  Completed  . HIV Screening  Completed    Cancer Screenings: Lung: Low Dose CT Chest recommended if Age 39-80 years, 30 pack-year currently smoking OR have quit w/in 15years. Patient does not qualify. Breast:  Up to date on Mammogram? Yes   Up to  date of Bone Density/Dexa? n/a Colorectal: up to date   Additional Screenings: done  Hepatitis C Screening: done      Plan:     I have personally reviewed and noted the following in the  patient's chart:   . Medical and social history . Use of alcohol, tobacco or illicit drugs  . Current medications and supplements . Functional ability and status . Nutritional status . Physical activity . Advanced directives . List of other physicians . Hospitalizations, surgeries, and ER visits in previous 12 months . Vitals . Screenings to include cognitive, depression, and falls . Referrals and appointments  In addition, I have reviewed and discussed with patient certain preventive protocols, quality metrics, and best practice recommendations. A written personalized care plan for preventive services as well as general preventive health recommendations were provided to patient.     Kate Sable, LPN, LPN  0/07/2329

## 2018-12-27 NOTE — Patient Instructions (Addendum)
Kelli Spencer , Thank you for taking time to come for your Medicare Wellness Visit. I appreciate your ongoing commitment to your health goals. Please review the following plan we discussed and let me know if I can assist you in the future.   Screening recommendations/referrals: Colonoscopy: done Mammogram: done  Bone Density: n/a Recommended yearly ophthalmology/optometry visit for glaucoma screening and checkup Recommended yearly dental visit for hygiene and checkup  Vaccinations: Influenza vaccine: declined Pneumococcal vaccine: declined Tdap vaccine: declined  Shingles vaccine: ask insurance if covered   Advanced directives: form given   Conditions/risks identified: diabetes, HTN  Next appointment: AWV in 1 year  Preventive Care 40-64 Years, Female Preventive care refers to lifestyle choices and visits with your health care provider that can promote health and wellness. What does preventive care include?  A yearly physical exam. This is also called an annual well check.  Dental exams once or twice a year.  Routine eye exams. Ask your health care provider how often you should have your eyes checked.  Personal lifestyle choices, including:  Daily care of your teeth and gums.  Regular physical activity.  Eating a healthy diet.  Avoiding tobacco and drug use.  Limiting alcohol use.  Practicing safe sex.  Taking low-dose aspirin daily starting at age 61.  Taking vitamin and mineral supplements as recommended by your health care provider. What happens during an annual well check? The services and screenings done by your health care provider during your annual well check will depend on your age, overall health, lifestyle risk factors, and family history of disease. Counseling  Your health care provider may ask you questions about your:  Alcohol use.  Tobacco use.  Drug use.  Emotional well-being.  Home and relationship well-being.  Sexual activity.  Eating  habits.  Work and work Statistician.  Method of birth control.  Menstrual cycle.  Pregnancy history. Screening  You may have the following tests or measurements:  Height, weight, and BMI.  Blood pressure.  Lipid and cholesterol levels. These may be checked every 5 years, or more frequently if you are over 40 years old.  Skin check.  Lung cancer screening. You may have this screening every year starting at age 34 if you have a 30-pack-year history of smoking and currently smoke or have quit within the past 15 years.  Fecal occult blood test (FOBT) of the stool. You may have this test every year starting at age 3.  Flexible sigmoidoscopy or colonoscopy. You may have a sigmoidoscopy every 5 years or a colonoscopy every 10 years starting at age 55.  Hepatitis C blood test.  Hepatitis B blood test.  Sexually transmitted disease (STD) testing.  Diabetes screening. This is done by checking your blood sugar (glucose) after you have not eaten for a while (fasting). You may have this done every 1-3 years.  Mammogram. This may be done every 1-2 years. Talk to your health care provider about when you should start having regular mammograms. This may depend on whether you have a family history of breast cancer.  BRCA-related cancer screening. This may be done if you have a family history of breast, ovarian, tubal, or peritoneal cancers.  Pelvic exam and Pap test. This may be done every 3 years starting at age 75. Starting at age 46, this may be done every 5 years if you have a Pap test in combination with an HPV test.  Bone density scan. This is done to screen for osteoporosis. You may have  this scan if you are at high risk for osteoporosis. Discuss your test results, treatment options, and if necessary, the need for more tests with your health care provider. Vaccines  Your health care provider may recommend certain vaccines, such as:  Influenza vaccine. This is recommended every  year.  Tetanus, diphtheria, and acellular pertussis (Tdap, Td) vaccine. You may need a Td booster every 10 years.  Zoster vaccine. You may need this after age 18.  Pneumococcal 13-valent conjugate (PCV13) vaccine. You may need this if you have certain conditions and were not previously vaccinated.  Pneumococcal polysaccharide (PPSV23) vaccine. You may need one or two doses if you smoke cigarettes or if you have certain conditions. Talk to your health care provider about which screenings and vaccines you need and how often you need them. This information is not intended to replace advice given to you by your health care provider. Make sure you discuss any questions you have with your health care provider. Document Released: 11/16/2015 Document Revised: 07/09/2016 Document Reviewed: 08/21/2015 Elsevier Interactive Patient Education  2017 [place] Prevention in the Home Falls can cause injuries. They can happen to people of all ages. There are many things you can do to make your home safe and to help prevent falls. What can I do on the outside of my home?  Regularly fix the edges of walkways and driveways and fix any cracks.  Remove anything that might make you trip as you walk through a door, such as a raised step or threshold.  Trim any bushes or trees on the path to your home.  Use bright outdoor lighting.  Clear any walking paths of anything that might make someone trip, such as rocks or tools.  Regularly check to see if handrails are loose or broken. Make sure that both sides of any steps have handrails.  Any raised decks and porches should have guardrails on the edges.  Have any leaves, snow, or ice cleared regularly.  Use sand or salt on walking paths during winter.  Clean up any spills in your garage right away. This includes oil or grease spills. What can I do in the bathroom?  Use night lights.  Install grab bars by the toilet and in the tub and shower.  Do not use towel bars as grab bars.  Use non-skid mats or decals in the tub or shower.  If you need to sit down in the shower, use a plastic, non-slip stool.  Keep the floor dry. Clean up any water that spills on the floor as soon as it happens.  Remove soap buildup in the tub or shower regularly.  Attach bath mats securely with double-sided non-slip rug tape.  Do not have throw rugs and other things on the floor that can make you trip. What can I do in the bedroom?  Use night lights.  Make sure that you have a light by your bed that is easy to reach.  Do not use any sheets or blankets that are too big for your bed. They should not hang down onto the floor.  Have a firm chair that has side arms. You can use this for support while you get dressed.  Do not have throw rugs and other things on the floor that can make you trip. What can I do in the kitchen?  Clean up any spills right away.  Avoid walking on wet floors.  Keep items that you use a lot in easy-to-reach places.  If you need to reach something above you, use a strong step stool that has a grab bar.  Keep electrical cords out of the way.  Do not use floor polish or wax that makes floors slippery. If you must use wax, use non-skid floor wax.  Do not have throw rugs and other things on the floor that can make you trip. What can I do with my stairs?  Do not leave any items on the stairs.  Make sure that there are handrails on both sides of the stairs and use them. Fix handrails that are broken or loose. Make sure that handrails are as long as the stairways.  Check any carpeting to make sure that it is firmly attached to the stairs. Fix any carpet that is loose or worn.  Avoid having throw rugs at the top or bottom of the stairs. If you do have throw rugs, attach them to the floor with carpet tape.  Make sure that you have a light switch at the top of the stairs and the bottom of the stairs. If you do not have them,  ask someone to add them for you. What else can I do to help prevent falls?  Wear shoes that:  Do not have high heels.  Have rubber bottoms.  Are comfortable and fit you well.  Are closed at the toe. Do not wear sandals.  If you use a stepladder:  Make sure that it is fully opened. Do not climb a closed stepladder.  Make sure that both sides of the stepladder are locked into place.  Ask someone to hold it for you, if possible.  Clearly mark and make sure that you can see:  Any grab bars or handrails.  First and last steps.  Where the edge of each step is.  Use tools that help you move around (mobility aids) if they are needed. These include:  Canes.  Walkers.  Scooters.  Crutches.  Turn on the lights when you go into a dark area. Replace any light bulbs as soon as they burn out.  Set up your furniture so you have a clear path. Avoid moving your furniture around.  If any of your floors are uneven, fix them.  If there are any pets around you, be aware of where they are.  Review your medicines with your doctor. Some medicines can make you feel dizzy. This can increase your chance of falling. Ask your doctor what other things that you can do to help prevent falls. This information is not intended to replace advice given to you by your health care provider. Make sure you discuss any questions you have with your health care provider. Document Released: 08/16/2009 Document Revised: 03/27/2016 Document Reviewed: 11/24/2014 Elsevier Interactive Patient Education  2017 [employer].

## 2019-01-04 ENCOUNTER — Telehealth: Payer: Self-pay

## 2019-01-04 ENCOUNTER — Encounter: Payer: Self-pay | Admitting: Gastroenterology

## 2019-01-04 ENCOUNTER — Ambulatory Visit (INDEPENDENT_AMBULATORY_CARE_PROVIDER_SITE_OTHER): Payer: Medicare Other | Admitting: Gastroenterology

## 2019-01-04 VITALS — BP 127/77 | HR 85 | Temp 97.1°F | Ht 60.0 in | Wt 193.2 lb

## 2019-01-04 DIAGNOSIS — K2100 Gastro-esophageal reflux disease with esophagitis, without bleeding: Secondary | ICD-10-CM

## 2019-01-04 DIAGNOSIS — K5900 Constipation, unspecified: Secondary | ICD-10-CM

## 2019-01-04 NOTE — Telephone Encounter (Signed)
Per Neil Crouch, PA, I called [facility] and spoke to the pharmacist [facility]. Pt has not been getting the bid dosing because she called in refills from previous prescription. Per Larene Beach, if pt will call them on 01/11/2019 and tell them she needs her new prescription for bid dosing they will make the change and discard the old prescription. I called and left vm for a return call from pt.

## 2019-01-04 NOTE — Progress Notes (Signed)
Primary Care Physician: Fayrene Helper, MD  Primary Gastroenterologist:  Barney Drain, MD   Chief Complaint  Patient presents with  . Gastroesophageal Reflux    occ but better    HPI: Kelli Spencer is a 53 y.o. female here for follow-up.  She has a history of GERD, dysphagia, constipation.  Next colonoscopy planned for August 2022.  She had a EGD in May 2019 for refractory GERD/dysphagia.  She had a benign esophageal stricture status post dilation, gastritis without H. pylori, benign fundic gland gastric polyps.  Sometimes needs pantoprazole twice a day but she had issues getting the new prescription for twice daily filled at her pharmacy.  She therefore is being forced to take once daily or she will run out quickly.  Denies any dysphagia, abdominal pain, melena, rectal bleeding.  Constipation well managed with Amitiza, averaging taking it every other day.   Current Outpatient Medications  Medication Sig Dispense Refill  . ACCU-CHEK AVIVA PLUS test strip USE TO TEST BLOOD SUGAR EVERY DAY 50 each 6  . ACCU-CHEK SOFTCLIX LANCETS lancets USE TO TEST BLOOD SUGAR EVERY DAY 100 each 5  . allopurinol (ZYLOPRIM) 300 MG tablet Take 1 tablet (300 mg total) by mouth daily. 30 tablet 6  . blood glucose meter kit and supplies Dispense based on patient and insurance preference. Once daily testing dx e11.9 1 each 0  . cholecalciferol (VITAMIN D) 400 UNITS TABS tablet Take 800 Units by mouth daily.    . clotrimazole-betamethasone (LOTRISONE) cream Apply cream twice daily to rash for 1 week, then as needed 45 g 0  . glipiZIDE (GLUCOTROL) 10 MG tablet TAKE 1 TABLET BY MOUTH EVERY DAY 90 tablet 1  . glipiZIDE (GLUCOTROL) 5 MG tablet Take 1 tablet every evening at dinnertime 90 tablet 3  . hydrochlorothiazide (MICROZIDE) 12.5 MG capsule TAKE 1 CAPSULE BY MOUTH DAILY 90 capsule 0  . hydrOXYzine (ATARAX/VISTARIL) 50 MG tablet TAKE 1 TABLET BY MOUTH AT BEDTIME FOR ITCHING OR ANXIETY (Patient  taking differently: Take 50 mg by mouth at bedtime as needed for anxiety or itching. ) 30 tablet 1  . lubiprostone (AMITIZA) 24 MCG capsule Take 1 capsule (24 mcg total) by mouth 2 (two) times daily with a meal. 180 capsule 3  . pantoprazole (PROTONIX) 40 MG tablet Take 1 tablet (40 mg total) by mouth 2 (two) times daily before a meal. 180 tablet 3  . potassium chloride (K-DUR,KLOR-CON) 10 MEQ tablet TAKE 1 TABLET(10 MEQ) BY MOUTH DAILY 90 tablet 0  . pravastatin (PRAVACHOL) 10 MG tablet TAKE 1 TABLET(10 MG) BY MOUTH DAILY 90 tablet 2  . tizanidine (ZANAFLEX) 2 MG capsule One  Capsule at bedtime as needed, back spasm (Patient taking differently: Take 2 mg by mouth at bedtime as needed for muscle spasms. ) 30 capsule 2  . TRADJENTA 5 MG TABS tablet TAKE 1 TABLET(5 MG) BY MOUTH DAILY 90 tablet 0   No current facility-administered medications for this visit.     Allergies as of 01/04/2019 - Review Complete 01/04/2019  Allergen Reaction Noted  . Prednisone Swelling and Other (See Comments) 06/09/2013  . Amlodipine Itching and Swelling 11/05/2017  . Ace inhibitors Cough 07/04/2016  . Metformin and related Diarrhea 05/05/2016    ROS:  General: Negative for anorexia, weight loss, fever, chills, fatigue, weakness. ENT: Negative for hoarseness, difficulty swallowing , nasal congestion. CV: Negative for chest pain, angina, palpitations, dyspnea on exertion, peripheral edema.  Respiratory: Negative for dyspnea at  rest, dyspnea on exertion, cough, sputum, wheezing.  GI: See history of present illness. GU:  Negative for dysuria, hematuria, urinary incontinence, urinary frequency, nocturnal urination.  Endo: Negative for unusual weight change.    Physical Examination:   BP 127/77   Pulse 85   Temp (!) 97.1 F (36.2 C) (Oral)   Ht 5' (1.524 m)   Wt 193 lb 3.2 oz (87.6 kg)   LMP 06/10/1988 (Approximate)   BMI 37.73 kg/m   General: Well-nourished, well-developed in no acute distress.  Eyes:  No icterus. Mouth: Oropharyngeal mucosa moist and pink , no lesions erythema or exudate. Lungs: Clear to auscultation bilaterally.  Heart: Regular rate and rhythm, no murmurs rubs or gallops.  Abdomen: Bowel sounds are normal, nontender, nondistended, no hepatosplenomegaly or masses, no abdominal bruits or hernia , no rebound or guarding.   Extremities: No lower extremity edema. No clubbing or deformities. Neuro: Alert and oriented x 4   Skin: Warm and dry, no jaundice.   Psych: Alert and cooperative, normal mood and affect.

## 2019-01-04 NOTE — Assessment & Plan Note (Signed)
Doing well on Amitiza.  Takes it about every other day.  Plan for next colonoscopy in August 2022.

## 2019-01-04 NOTE — Assessment & Plan Note (Signed)
Symptoms stable, occasional breakthrough symptoms especially in the evenings.  We will contact pharmacy to see why she is having problems filling her new prescription for twice daily.  Continue pantoprazole 40 mg 1-2 times daily before meals.  Return to the office in 1 year or call sooner if needed.

## 2019-01-04 NOTE — Telephone Encounter (Signed)
LMOM for a return call.  

## 2019-01-04 NOTE — Patient Instructions (Signed)
1. Continue pantoprazole 1-2 times daily for reflux.  We will contact pharmacy regarding the issues you have been having with getting your prescription filled.   2. Continue Amitiza 1 twice daily as needed for constipation. 3. Return to the office in 1 year or call sooner if needed.

## 2019-01-05 ENCOUNTER — Telehealth: Payer: Self-pay | Admitting: Gastroenterology

## 2019-01-05 NOTE — Telephone Encounter (Signed)
[phone] PATIENT RETURNED CALL, PLEASE CALL BACK

## 2019-01-06 NOTE — Telephone Encounter (Signed)
PT is aware.

## 2019-01-06 NOTE — Telephone Encounter (Signed)
See previous phone note. I have spoken to the pt.

## 2019-01-07 DIAGNOSIS — I10 Essential (primary) hypertension: Secondary | ICD-10-CM | POA: Diagnosis not present

## 2019-01-07 DIAGNOSIS — E1165 Type 2 diabetes mellitus with hyperglycemia: Secondary | ICD-10-CM | POA: Diagnosis not present

## 2019-01-07 DIAGNOSIS — E1121 Type 2 diabetes mellitus with diabetic nephropathy: Secondary | ICD-10-CM | POA: Diagnosis not present

## 2019-01-07 DIAGNOSIS — E559 Vitamin D deficiency, unspecified: Secondary | ICD-10-CM | POA: Diagnosis not present

## 2019-01-08 LAB — BASIC METABOLIC PANEL WITH GFR
BUN / CREAT RATIO: 20 (calc) (ref 6–22)
BUN: 24 mg/dL (ref 7–25)
CALCIUM: 10.1 mg/dL (ref 8.6–10.4)
CO2: 28 mmol/L (ref 20–32)
CREATININE: 1.18 mg/dL — AB (ref 0.50–1.05)
Chloride: 100 mmol/L (ref 98–110)
GFR, EST AFRICAN AMERICAN: 61 mL/min/{1.73_m2} (ref >=60)
GFR, Est Non African American: 52 mL/min/{1.73_m2} — ABNORMAL LOW (ref >=60)
GLUCOSE: 189 mg/dL — AB (ref 65–99)
POTASSIUM: 4.3 mmol/L (ref 3.5–5.3)
Sodium: 140 mmol/L (ref 135–146)

## 2019-01-08 LAB — HEMOGLOBIN A1C
HEMOGLOBIN A1C: 9.8 %{Hb} — AB (ref <5.7)
MEAN PLASMA GLUCOSE: 235 (calc)
eAG (mmol/L): 13 (calc)

## 2019-01-08 LAB — VITAMIN D 25 HYDROXY (VIT D DEFICIENCY, FRACTURES): VIT D 25 HYDROXY: 39 ng/mL (ref 30–100)

## 2019-01-08 LAB — URIC ACID: Uric Acid, Serum: 2.5 mg/dL (ref 2.5–7.0)

## 2019-01-08 LAB — TSH: TSH: 3.52 mIU/L

## 2019-01-13 ENCOUNTER — Ambulatory Visit (INDEPENDENT_AMBULATORY_CARE_PROVIDER_SITE_OTHER): Payer: Medicare Other | Admitting: Family Medicine

## 2019-01-13 ENCOUNTER — Other Ambulatory Visit: Payer: Self-pay

## 2019-01-13 ENCOUNTER — Ambulatory Visit: Payer: Medicare Other | Admitting: Family Medicine

## 2019-01-13 VITALS — BP 120/80 | HR 80 | Ht 60.0 in | Wt 193.0 lb

## 2019-01-13 DIAGNOSIS — E1121 Type 2 diabetes mellitus with diabetic nephropathy: Secondary | ICD-10-CM | POA: Diagnosis not present

## 2019-01-13 DIAGNOSIS — IMO0002 Reserved for concepts with insufficient information to code with codable children: Secondary | ICD-10-CM

## 2019-01-13 DIAGNOSIS — E785 Hyperlipidemia, unspecified: Secondary | ICD-10-CM | POA: Diagnosis not present

## 2019-01-13 DIAGNOSIS — Z2821 Immunization not carried out because of patient refusal: Secondary | ICD-10-CM

## 2019-01-13 DIAGNOSIS — I10 Essential (primary) hypertension: Secondary | ICD-10-CM | POA: Diagnosis not present

## 2019-01-13 DIAGNOSIS — E1165 Type 2 diabetes mellitus with hyperglycemia: Secondary | ICD-10-CM

## 2019-01-13 MED ORDER — GLUCOSE BLOOD VI STRP: ORAL_STRIP | 5 refills | Status: DC

## 2019-01-13 MED ORDER — ACCU-CHEK SOFTCLIX LANCETS MISC: 5 refills | Status: AC

## 2019-01-13 MED ORDER — GLIPIZIDE 10 MG PO TABS: 10.0000 mg | ORAL_TABLET | Freq: Two times a day (BID) | ORAL | 1 refills | Status: DC

## 2019-01-13 NOTE — Patient Instructions (Signed)
Follow up in 12.5 weeks  Fasting lab work due before next visit. Your order has been sent to the lab   Test at least once daily (fasting) may test twice daily since your blood sugar is uncontrolled.  Before breakfast - 80-120 Bedtime - 130-180 are your goal ranges  Contact us if your sugar drops below 70 or remains high   YOU CAN DO THIS AND WE ARE HERE TO HELP YOU!

## 2019-01-15 ENCOUNTER — Encounter: Payer: Self-pay | Admitting: Family Medicine

## 2019-01-15 NOTE — Assessment & Plan Note (Signed)
Deteriorated. Obesity linked with hypertension and uncontrolled diabetes  Patient re-educated about  the importance of commitment to a  minimum of 150 minutes of exercise per week as able.  The importance of healthy food choices with portion control discussed, as well as eating regularly and within a 12 hour window most days. The need to choose "clean , green" food 50 to 75% of the time is discussed, as well as to make water the primary drink and set a goal of 64 ounces water daily.  Encouraged to start a food diary,  and to consider  joining a support group. Sample diet sheets offered. Goals set by the patient for the next several months.   Weight /BMI 01/13/2019 01/04/2019 12/27/2018  WEIGHT 193 lb 193 lb 3.2 oz 193 lb  HEIGHT - 5\' 0"  5\' 0"   BMI 37.69 kg/m2 37.73 kg/m2 37.69 kg/m2

## 2019-01-15 NOTE — Assessment & Plan Note (Signed)
Hyperlipidemia:Low fat diet discussed and encouraged.   Lipid Panel  Lab Results  Component Value Date   CHOL 135 09/07/2018   HDL 42 (L) 09/07/2018   LDLCALC 80 09/07/2018   TRIG 57 09/07/2018   CHOLHDL 3.2 09/07/2018   Controlled, no change in medication Needs to increase exercise

## 2019-01-15 NOTE — Assessment & Plan Note (Signed)
Controlled, no change in medication DASH diet and commitment to daily physical activity for a minimum of 30 minutes discussed and encouraged, as a part of hypertension management. The importance of attaining a healthy weight is also discussed.  BP/Weight 01/13/2019 01/04/2019 12/27/2018 10/11/2018 09/14/2018 07/06/2018 6/37/8588  Systolic BP 502 774 128 786 767 209 470  Diastolic BP 80 77 84 78 80 75 70  Wt. (Lbs) 193 193.2 193 195 186 194.6 196.08  BMI 37.69 37.73 37.69 38.08 36.33 38.01 38.29

## 2019-01-15 NOTE — Assessment & Plan Note (Signed)
Re educated and influenza vaccine again refused

## 2019-01-15 NOTE — Progress Notes (Signed)
Kelli Spencer     MRN: [id number]      DOB: [birth year]   HPI Kelli Spencer is here for follow up and re-evaluation of chronic medical conditions, medication management and review of any available recent lab and radiology data.  Preventive health is updated, specifically  Cancer screening and Immunization.   Questions or concerns regarding consultations or procedures which the PT has had in the interim are  addressed. The PT denies any adverse reactions to current medications since the last visit.  C/o increased and uncontrolled stress due to poor relationship with Kelli Spencer daughter currently living with Kelli Spencer who has mental health challenges , she has not been  Following a diabetic diet, eating pout a lot and not testing She is however surprised and distressed as far as Kelli Spencer markedly uncontrolled blood sugar test result  ROS Denies recent fever or chills. Denies sinus pressure, nasal congestion, ear pain or sore throat. Denies chest congestion, productive cough or wheezing. Denies chest pains, palpitations and leg swelling Denies abdominal pain, nausea, vomiting,diarrhea or constipation.   Denies dysuria, frequency, hesitancy or incontinence. Denies uncontrolled  joint pain, swelling and limitation in mobility. Denies headaches, seizures, numbness, or tingling. C/o  Depression and  anxiety or insomnia. Denies skin break down or rash.   PE  BP 120/80   Pulse 80   Wt 193 lb (87.5 kg)   LMP 06/10/1988 (Approximate)   BMI 37.69 kg/m   Patient alert and oriented and in no cardiopulmonary distress.  HEENT: No facial asymmetry, EOMI,   oropharynx pink and moist.  Neck supple no JVD, no mass.  Chest: Clear to auscultation bilaterally.  CVS: S1, S2 no murmurs, no S3.Regular rate.  ABD: Soft non tender.   Ext: No edema  MS: Adequate ROM spine, shoulders, hips and reduced in right  knee.  Skin: Intact, no ulcerations or rash noted.  Psych: Good eye contact, tearful  affect. Memory  intact both  anxious and  depressed appearing.  CNS: CN 2-12 intact, power,  normal throughout.no focal deficits noted.   Assessment & Plan  Essential hypertension Controlled, no change in medication DASH diet and commitment to daily physical activity for a minimum of 30 minutes discussed and encouraged, as a part of hypertension management. The importance of attaining a healthy weight is also discussed.  BP/Weight 01/13/2019 01/04/2019 12/27/2018 10/11/2018 09/14/2018 07/06/2018 1/49/7026  Systolic BP 378 588 502 774 128 786 767  Diastolic BP 80 77 84 78 80 75 70  Wt. (Lbs) 193 193.2 193 195 186 194.6 196.08  BMI 37.69 37.73 37.69 38.08 36.33 38.01 38.29       Uncontrolled type 2 diabetes mellitus with diabetic nephropathy, without long-term current use of insulin ([place]) Deteriorated Kelli Spencer is reminded of the importance of commitment to daily physical activity for 30 minutes or more, as able and the need to limit carbohydrate intake to 30 to 60 grams per meal to help with blood sugar control.   The need to take medication as prescribed, test blood sugar as directed, and to call between visits if there is a concern that blood sugar is uncontrolled is also discussed.   Kelli Spencer is reminded of the importance of daily foot exam, annual eye examination, and good blood sugar, blood pressure and cholesterol control. Diabetic education x 8 mins Updated lab needed at/ before next visit. May test twice daily  Diabetic Labs Latest Ref Rng & Units 01/07/2019 09/07/2018 05/26/2018 01/19/2018 09/22/2017  HbA1c <5.7 % of  total Hgb 9.8(H) 7.7(H) 8.4(H) 8.1(H) 7.4(H)  Microalbumin mg/dL - 0.9 - - 0.7  Micro/Creat Ratio <30 mcg/mg creat - - - - 4  Chol <200 mg/dL - 135 - - 138  HDL >50 mg/dL - 42(L) - - 41(L)  Calc LDL mg/dL (calc) - 80 - - 82  Triglycerides <150 mg/dL - 57 - - 66  Creatinine 0.50 - 1.05 mg/dL 1.18(H) 1.38(H) 1.22(H) 1.28(H) 1.24(H)   BP/Weight 01/13/2019 01/04/2019 12/27/2018 10/11/2018  09/14/2018 07/06/2018 07/02/9406  Systolic BP 680 881 103 159 458 592 924  Diastolic BP 80 77 84 78 80 75 70  Wt. (Lbs) 193 193.2 193 195 186 194.6 196.08  BMI 37.69 37.73 37.69 38.08 36.33 38.01 38.29   Foot/eye exam completion dates Latest Ref Rng & Units 11/04/2018 06/14/2018  Eye Exam No Retinopathy No Retinopathy No Retinopathy  Foot Form Completion - - -        Morbid obesity ([facility]) Deteriorated. Obesity linked with hypertension and uncontrolled diabetes  Patient re-educated about  the importance of commitment to a  minimum of 150 minutes of exercise per week as able.  The importance of healthy food choices with portion control discussed, as well as eating regularly and within a 12 hour window most days. The need to choose "clean , green" food 50 to 75% of the time is discussed, as well as to make water the primary drink and set a goal of 64 ounces water daily.  Encouraged to start a food diary,  and to consider  joining a support group. Sample diet sheets offered. Goals set by the patient for the next several months.   Weight /BMI 01/13/2019 01/04/2019 12/27/2018  WEIGHT 193 lb 193 lb 3.2 oz 193 lb  HEIGHT - 5\' 0"  5\' 0"   BMI 37.69 kg/m2 37.73 kg/m2 37.69 kg/m2      Dyslipidemia, goal LDL below 100 Hyperlipidemia:Low fat diet discussed and encouraged.   Lipid Panel  Lab Results  Component Value Date   CHOL 135 09/07/2018   HDL 42 (L) 09/07/2018   LDLCALC 80 09/07/2018   TRIG 57 09/07/2018   CHOLHDL 3.2 09/07/2018   Controlled, no change in medication Needs to increase exercise    Immunization refused Re educated and influenza vaccine again refused

## 2019-01-15 NOTE — Assessment & Plan Note (Signed)
Deteriorated Kelli Spencer is reminded of the importance of commitment to daily physical activity for 30 minutes or more, as able and the need to limit carbohydrate intake to 30 to 60 grams per meal to help with blood sugar control.   The need to take medication as prescribed, test blood sugar as directed, and to call between visits if there is a concern that blood sugar is uncontrolled is also discussed.   Kelli Spencer is reminded of the importance of daily foot exam, annual eye examination, and good blood sugar, blood pressure and cholesterol control. Diabetic education x 8 mins Updated lab needed at/ before next visit. May test twice daily  Diabetic Labs Latest Ref Rng & Units 01/07/2019 09/07/2018 05/26/2018 01/19/2018 09/22/2017  HbA1c <5.7 % of total Hgb 9.8(H) 7.7(H) 8.4(H) 8.1(H) 7.4(H)  Microalbumin mg/dL - 0.9 - - 0.7  Micro/Creat Ratio <30 mcg/mg creat - - - - 4  Chol <200 mg/dL - 135 - - 138  HDL >50 mg/dL - 42(L) - - 41(L)  Calc LDL mg/dL (calc) - 80 - - 82  Triglycerides <150 mg/dL - 57 - - 66  Creatinine 0.50 - 1.05 mg/dL 1.18(H) 1.38(H) 1.22(H) 1.28(H) 1.24(H)   BP/Weight 01/13/2019 01/04/2019 12/27/2018 10/11/2018 09/14/2018 07/06/2018 06/20/5908  Systolic BP 311 216 244 695 072 257 505  Diastolic BP 80 77 84 78 80 75 70  Wt. (Lbs) 193 193.2 193 195 186 194.6 196.08  BMI 37.69 37.73 37.69 38.08 36.33 38.01 38.29   Foot/eye exam completion dates Latest Ref Rng & Units 11/04/2018 06/14/2018  Eye Exam No Retinopathy No Retinopathy No Retinopathy  Foot Form Completion - - -

## 2019-01-25 ENCOUNTER — Other Ambulatory Visit: Payer: Self-pay | Admitting: Family Medicine

## 2019-02-27 ENCOUNTER — Encounter: Payer: Self-pay | Admitting: Orthopedic Surgery

## 2019-03-16 ENCOUNTER — Encounter: Payer: Self-pay | Admitting: Orthopedic Surgery

## 2019-03-16 ENCOUNTER — Ambulatory Visit (INDEPENDENT_AMBULATORY_CARE_PROVIDER_SITE_OTHER): Payer: Medicare Other | Admitting: Orthopedic Surgery

## 2019-03-16 ENCOUNTER — Ambulatory Visit (INDEPENDENT_AMBULATORY_CARE_PROVIDER_SITE_OTHER): Payer: Medicare Other

## 2019-03-16 ENCOUNTER — Other Ambulatory Visit: Payer: Self-pay

## 2019-03-16 VITALS — BP 109/75 | HR 77 | Temp 96.6°F | Ht 60.0 in | Wt 193.0 lb

## 2019-03-16 DIAGNOSIS — Z96651 Presence of right artificial knee joint: Secondary | ICD-10-CM

## 2019-03-16 NOTE — Progress Notes (Signed)
ANNUAL FOLLOW UP FOR right  TKA   Chief Complaint  Patient presents with  . Routine Post Op    03/18/17 right total knee replacement      HPI: The patient is here for the annual  follow-up x-ray for knee replacement. The patient is not complaining of pain weakness instability or stiffness in the repaired knee.   Review of Systems  Constitutional: Negative for fever.  Respiratory: Negative for shortness of breath.   Cardiovascular: Negative for chest pain.  Skin: Negative.   Neurological: Negative for tingling and sensory change.    Past Medical History:  Diagnosis Date  . Arthritis   . Asthma   . Diabetes mellitus without complication ([place])   . Diastolic hypertension   . GERD (gastroesophageal reflux disease)   . Hyperlipidemia   . Hypertension   . Migraines   . Obesity   . Seizures ([place])    pt states she has seizures only when she gets upset. pt is taking no seizure meds     Examination of the right  KNEE  BP 109/75   Pulse 77   Temp (!) 96.6 F (35.9 C)   Ht 5' (1.524 m)   Wt 193 lb (87.5 kg)   LMP 06/10/1988 (Approximate)   BMI 37.69 kg/m   General the patient is normally groomed in no distress  Mood normal Affect pleasant   The patient is Awake and alert ; oriented normal   Inspection shows : incision healed nicely without erythema, no tenderness no swelling  Range of motion total range of motion is 115  Stability the knee is stable anterior to posterior as well as medial to lateral  Strength quadriceps strength is normal  Skin no erythema around the skin incision  Cardiovascular NO EDEMA   Neuro: normal sensation in the operative leg   Gait: normal expected gait without cane    Medical decision-making section  X-rays ordered with the following personal interpretation  Normal alignment without loosening   Diagnosis  Encounter Diagnosis  Name Primary?  . S/P total knee replacement, right 03/18/17 Yes     Plan follow-up 1 year repeat  x-rays

## 2019-03-26 ENCOUNTER — Other Ambulatory Visit: Payer: Self-pay | Admitting: Family Medicine

## 2019-03-26 DIAGNOSIS — E1169 Type 2 diabetes mellitus with other specified complication: Secondary | ICD-10-CM

## 2019-03-26 DIAGNOSIS — E669 Obesity, unspecified: Secondary | ICD-10-CM

## 2019-03-30 ENCOUNTER — Other Ambulatory Visit (HOSPITAL_COMMUNITY): Payer: Self-pay | Admitting: Family Medicine

## 2019-03-30 DIAGNOSIS — Z1231 Encounter for screening mammogram for malignant neoplasm of breast: Secondary | ICD-10-CM

## 2019-03-31 ENCOUNTER — Other Ambulatory Visit: Payer: Self-pay

## 2019-03-31 ENCOUNTER — Ambulatory Visit (HOSPITAL_COMMUNITY)
Admission: RE | Admit: 2019-03-31 | Discharge: 2019-03-31 | Disposition: A | Discharge status: Home / Self Care | Payer: Medicare Other | Source: Ambulatory Visit | Attending: Family Medicine | Admitting: Family Medicine

## 2019-03-31 DIAGNOSIS — Z1231 Encounter for screening mammogram for malignant neoplasm of breast: Secondary | ICD-10-CM | POA: Insufficient documentation

## 2019-04-06 ENCOUNTER — Telehealth: Payer: Self-pay

## 2019-04-06 DIAGNOSIS — E1169 Type 2 diabetes mellitus with other specified complication: Secondary | ICD-10-CM | POA: Diagnosis not present

## 2019-04-06 DIAGNOSIS — I10 Essential (primary) hypertension: Secondary | ICD-10-CM

## 2019-04-06 DIAGNOSIS — E785 Hyperlipidemia, unspecified: Secondary | ICD-10-CM | POA: Diagnosis not present

## 2019-04-06 NOTE — Telephone Encounter (Signed)
Labs entered to ne drawn before patients next visit

## 2019-04-07 LAB — CBC
HCT: 37.8 % (ref 35.0–45.0)
Hemoglobin: 12.5 g/dL (ref 11.7–15.5)
MCH: 29.8 pg (ref 27.0–33.0)
MCHC: 33.1 g/dL (ref 32.0–36.0)
MCV: 90.2 fL (ref 80.0–100.0)
MPV: 11.1 fL (ref 7.5–12.5)
Platelets: 319 10*3/uL (ref 140–400)
RBC: 4.19 10*6/uL (ref 3.80–5.10)
RDW: 12.6 % (ref 11.0–15.0)
WBC: 6.4 10*3/uL (ref 3.8–10.8)

## 2019-04-07 LAB — COMPLETE METABOLIC PANEL WITH GFR
AG Ratio: 1.4 (calc) (ref 1.0–2.5)
ALT: 13 U/L (ref 6–29)
AST: 14 U/L (ref 10–35)
Albumin: 4.5 g/dL (ref 3.6–5.1)
Alkaline phosphatase (APISO): 102 U/L (ref 37–153)
BUN/Creatinine Ratio: 16 (calc) (ref 6–22)
BUN: 19 mg/dL (ref 7–25)
CO2: 29 mmol/L (ref 20–32)
Calcium: 9.9 mg/dL (ref 8.6–10.4)
Chloride: 100 mmol/L (ref 98–110)
Creat: 1.2 mg/dL — ABNORMAL HIGH (ref 0.50–1.05)
GFR, Est African American: 59 mL/min/{1.73_m2} — ABNORMAL LOW (ref >=60)
GFR, Est Non African American: 51 mL/min/{1.73_m2} — ABNORMAL LOW (ref >=60)
Globulin: 3.3 g/dL (calc) (ref 1.9–3.7)
Glucose, Bld: 137 mg/dL — ABNORMAL HIGH (ref 65–99)
Potassium: 4.5 mmol/L (ref 3.5–5.3)
Sodium: 140 mmol/L (ref 135–146)
Total Bilirubin: 0.4 mg/dL (ref 0.2–1.2)
Total Protein: 7.8 g/dL (ref 6.1–8.1)

## 2019-04-07 LAB — LIPID PANEL
Cholesterol: 130 mg/dL (ref <200)
HDL: 42 mg/dL — ABNORMAL LOW (ref >=50)
LDL Cholesterol (Calc): 74 mg/dL (calc)
Non-HDL Cholesterol (Calc): 88 mg/dL (calc) (ref <130)
Total CHOL/HDL Ratio: 3.1 (calc) (ref <5.0)
Triglycerides: 64 mg/dL (ref <150)

## 2019-04-07 LAB — HEMOGLOBIN A1C
Hgb A1c MFr Bld: 7.6 % of total Hgb — ABNORMAL HIGH (ref <5.7)
Mean Plasma Glucose: 171 (calc)
eAG (mmol/L): 9.5 (calc)

## 2019-04-14 ENCOUNTER — Other Ambulatory Visit: Payer: Self-pay

## 2019-04-14 ENCOUNTER — Ambulatory Visit (INDEPENDENT_AMBULATORY_CARE_PROVIDER_SITE_OTHER): Payer: Medicare Other | Admitting: Family Medicine

## 2019-04-14 ENCOUNTER — Encounter: Payer: Self-pay | Admitting: Family Medicine

## 2019-04-14 VITALS — BP 109/75 | Ht 60.0 in | Wt 195.0 lb

## 2019-04-14 DIAGNOSIS — E1169 Type 2 diabetes mellitus with other specified complication: Secondary | ICD-10-CM | POA: Diagnosis not present

## 2019-04-14 DIAGNOSIS — E785 Hyperlipidemia, unspecified: Secondary | ICD-10-CM

## 2019-04-14 DIAGNOSIS — E669 Obesity, unspecified: Secondary | ICD-10-CM

## 2019-04-14 DIAGNOSIS — I10 Essential (primary) hypertension: Secondary | ICD-10-CM | POA: Diagnosis not present

## 2019-04-14 MED ORDER — HYDROXYZINE HCL 50 MG PO TABS: ORAL_TABLET | ORAL | 3 refills | Status: DC

## 2019-04-14 NOTE — Assessment & Plan Note (Signed)
Obesity  Linked with hypertension and diabetes Ms. Runnels is reminded of the importance of commitment to daily physical activity for 30 minutes or more, as able and the need to limit carbohydrate intake to 30 to 60 grams per meal to help with blood sugar control.   The need to take medication as prescribed, test blood sugar as directed, and to call between visits if there is a concern that blood sugar is uncontrolled is also discussed.   Ms. Bracco is reminded of the importance of daily foot exam, annual eye examination, and good blood sugar, blood pressure and cholesterol control.  Diabetic Labs Latest Ref Rng & Units 04/06/2019 01/07/2019 09/07/2018 05/26/2018 01/19/2018  HbA1c <5.7 % of total Hgb 7.6(H) 9.8(H) 7.7(H) 8.4(H) 8.1(H)  Microalbumin mg/dL - - 0.9 - -  Micro/Creat Ratio <30 mcg/mg creat - - - - -  Chol <200 mg/dL 130 - 135 - -  HDL > OR = 50 mg/dL 42(L) - 42(L) - -  Calc LDL mg/dL (calc) 74 - 80 - -  Triglycerides <150 mg/dL 64 - 57 - -  Creatinine 0.50 - 1.05 mg/dL 1.20(H) 1.18(H) 1.38(H) 1.22(H) 1.28(H)   BP/Weight 04/14/2019 03/16/2019 01/13/2019 01/04/2019 12/27/2018 10/11/2018 41/32/4401  Systolic BP 027 253 664 403 474 259 563  Diastolic BP 75 75 80 77 84 78 80  Wt. (Lbs) 195 193 193 193.2 193 195 186  BMI 38.08 37.69 37.69 37.73 37.69 38.08 36.33   Foot/eye exam completion dates Latest Ref Rng & Units 11/04/2018 06/14/2018  Eye Exam No Retinopathy No Retinopathy No Retinopathy  Foot Form Completion - - -

## 2019-04-14 NOTE — Patient Instructions (Signed)
Annual physical exam with MD and re eval blood sugar mid September, call if you need me sooner  Non fast HBA1C, chem 7 and eGFR early September  STOP metformin please  Goal for fasting blood sugar ranges from 80 to 120 and 2 hours after any meal or at bedtime should be between 130 to 170.   Test once daily, call with concerns  It is important that you exercise regularly at least 30 minutes 5 times a week. If you develop chest pain, have severe difficulty breathing, or feel very tired, stop exercising immediately and seek medical attention  Think about what you will eat, plan ahead. Choose " clean, green, fresh or frozen" over canned, processed or packaged foods which are more sugary, salty and fatty. 70 to 75% of food eaten should be vegetables and fruit. Three meals at set times with snacks allowed between meals, but they must be fruit or vegetables. Aim to eat over a 12 hour period , example 7 am to 7 pm, and STOP after  your last meal of the day. Drink water,generally about 64 ounces per day, no other drink is as healthy. Fruit juice is best enjoyed in a healthy way, by EATING the fruit. Social distancing. Frequent hand washing with soap and water Keeping your hands off of your face. These 3 practices will help to keep both you and your community healthy during this time. Please practice them faithfully! Thanks for choosing [facility], we consider it a privelige to serve you.

## 2019-04-14 NOTE — Assessment & Plan Note (Signed)
Controlled, no change in medication Kelli Spencer is reminded of the importance of commitment to daily physical activity for 30 minutes or more, as able and the need to limit carbohydrate intake to 30 to 60 grams per meal to help with blood sugar control.   The need to take medication as prescribed, test blood sugar as directed, and to call between visits if there is a concern that blood sugar is uncontrolled is also discussed.   Kelli Spencer is reminded of the importance of daily foot exam, annual eye examination, and good blood sugar, blood pressure and cholesterol control.  Diabetic Labs Latest Ref Rng & Units 04/06/2019 01/07/2019 09/07/2018 05/26/2018 01/19/2018  HbA1c <5.7 % of total Hgb 7.6(H) 9.8(H) 7.7(H) 8.4(H) 8.1(H)  Microalbumin mg/dL - - 0.9 - -  Micro/Creat Ratio <30 mcg/mg creat - - - - -  Chol <200 mg/dL 130 - 135 - -  HDL > OR = 50 mg/dL 42(L) - 42(L) - -  Calc LDL mg/dL (calc) 74 - 80 - -  Triglycerides <150 mg/dL 64 - 57 - -  Creatinine 0.50 - 1.05 mg/dL 1.20(H) 1.18(H) 1.38(H) 1.22(H) 1.28(H)   BP/Weight 04/14/2019 03/16/2019 01/13/2019 01/04/2019 12/27/2018 10/11/2018 65/46/5035  Systolic BP 465 681 275 170 017 494 496  Diastolic BP 75 75 80 77 84 78 80  Wt. (Lbs) 195 193 193 193.2 193 195 186  BMI 38.08 37.69 37.69 37.73 37.69 38.08 36.33   Foot/eye exam completion dates Latest Ref Rng & Units 11/04/2018 06/14/2018  Eye Exam No Retinopathy No Retinopathy No Retinopathy  Foot Form Completion - - -

## 2019-04-14 NOTE — Progress Notes (Signed)
Virtual Visit via Telephone Note  I connected with Kelli Spencer on 04/14/19 at  8:00 AM EDT by telephone and verified that I am speaking with the correct person using two identifiers.  Location: Patient: home Provider: office   I discussed the limitations, risks, security and privacy concerns of performing an evaluation and management service by telephone and the availability of in person appointments. I also discussed with the patient that there may be a patient responsible charge related to this service. The patient expressed understanding and agreed to proceed. This visit type is conducted due to national recommendations for restrictions regarding the COVID -19 Pandemic. Due to the patient's age and / or co morbidities, this format is felt to be most appropriate at this time without adequate follow up. The patient has no access to video technology/ had technical difficulties with video, requiring transitioning to audio format  only ( telephone ). All issues noted this document were discussed and addressed,no physical exam can be performed in this format.   History of Present Illness: Denies recent fever or chills. Denies sinus pressure, nasal congestion, ear pain or sore throat. Denies chest congestion, productive cough or wheezing. Denies chest pains, palpitations and leg swelling Denies abdominal pain, nausea, vomiting,diarrhea or constipation.   Denies dysuria, frequency, hesitancy or incontinence. Denies joint pain, swelling and limitation in mobility. Denies headaches, seizures, numbness, or tingling. Denies depression, anxiety or insomnia. Denies skin break down or rash. Denies polyuria, polydipsia, blurred vision , or hypoglycemic episodes.        Observations/Objective: BP 109/75   Ht 5' (1.524 m)   Wt 195 lb (88.5 kg)   LMP 06/10/1988 (Approximate)   BMI 38.08 kg/m  Good communication with no confusion and intact memory. Alert and oriented x 3 No signs of  respiratory distress during sppech    Assessment and Plan: Essential hypertension Controlled, no change in medication DASH diet and commitment to daily physical activity for a minimum of 30 minutes discussed and encouraged, as a part of hypertension management. The importance of attaining a healthy weight is also discussed.  BP/Weight 04/14/2019 03/16/2019 01/13/2019 01/04/2019 12/27/2018 10/11/2018 62/70/3500  Systolic BP 938 182 993 716 967 893 810  Diastolic BP 75 75 80 77 84 78 80  Wt. (Lbs) 195 193 193 193.2 193 195 186  BMI 38.08 37.69 37.69 37.73 37.69 38.08 36.33       Diabetes mellitus type 2 in obese (HCC) Controlled, no change in medication [name] is reminded of the importance of commitment to daily physical activity for 30 minutes or more, as able and the need to limit carbohydrate intake to 30 to 60 grams per meal to help with blood sugar control.   The need to take medication as prescribed, test blood sugar as directed, and to call between visits if there is a concern that blood sugar is uncontrolled is also discussed.   [name] is reminded of the importance of daily foot exam, annual eye examination, and good blood sugar, blood pressure and cholesterol control.  Diabetic Labs Latest Ref Rng & Units 04/06/2019 01/07/2019 09/07/2018 05/26/2018 01/19/2018  HbA1c <5.7 % of total Hgb 7.6(H) 9.8(H) 7.7(H) 8.4(H) 8.1(H)  Microalbumin mg/dL - - 0.9 - -  Micro/Creat Ratio <30 mcg/mg creat - - - - -  Chol <200 mg/dL 130 - 135 - -  HDL > OR = 50 mg/dL 42(L) - 42(L) - -  Calc LDL mg/dL (calc) 74 - 80 - -  Triglycerides <150  mg/dL 64 - 57 - -  Creatinine 0.50 - 1.05 mg/dL 1.20(H) 1.18(H) 1.38(H) 1.22(H) 1.28(H)   BP/Weight 04/14/2019 03/16/2019 01/13/2019 01/04/2019 12/27/2018 10/11/2018 24/23/5361  Systolic BP 443 154 008 676 195 093 267  Diastolic BP 75 75 80 77 84 78 80  Wt. (Lbs) 195 193 193 193.2 193 195 186  BMI 38.08 37.69 37.69 37.73 37.69 38.08 36.33   Foot/eye exam completion  dates Latest Ref Rng & Units 11/04/2018 06/14/2018  Eye Exam No Retinopathy No Retinopathy No Retinopathy  Foot Form Completion - - -        Dyslipidemia, goal LDL below 100 Needs to increase exercise to improve  Hyperlipidemia:Low fat diet discussed and encouraged.   Lipid Panel  Lab Results  Component Value Date   CHOL 130 04/06/2019   HDL 42 (L) 04/06/2019   LDLCALC 74 04/06/2019   TRIG 64 04/06/2019   CHOLHDL 3.1 04/06/2019       Morbid obesity ([place]) Obesity  Linked with hypertension and diabetes [name] is reminded of the importance of commitment to daily physical activity for 30 minutes or more, as able and the need to limit carbohydrate intake to 30 to 60 grams per meal to help with blood sugar control.   The need to take medication as prescribed, test blood sugar as directed, and to call between visits if there is a concern that blood sugar is uncontrolled is also discussed.   [name] is reminded of the importance of daily foot exam, annual eye examination, and good blood sugar, blood pressure and cholesterol control.  Diabetic Labs Latest Ref Rng & Units 04/06/2019 01/07/2019 09/07/2018 05/26/2018 01/19/2018  HbA1c <5.7 % of total Hgb 7.6(H) 9.8(H) 7.7(H) 8.4(H) 8.1(H)  Microalbumin mg/dL - - 0.9 - -  Micro/Creat Ratio <30 mcg/mg creat - - - - -  Chol <200 mg/dL 130 - 135 - -  HDL > OR = 50 mg/dL 42(L) - 42(L) - -  Calc LDL mg/dL (calc) 74 - 80 - -  Triglycerides <150 mg/dL 64 - 57 - -  Creatinine 0.50 - 1.05 mg/dL 1.20(H) 1.18(H) 1.38(H) 1.22(H) 1.28(H)   BP/Weight 04/14/2019 03/16/2019 01/13/2019 01/04/2019 12/27/2018 10/11/2018 12/45/8099  Systolic BP 833 825 053 976 734 193 790  Diastolic BP 75 75 80 77 84 78 80  Wt. (Lbs) 195 193 193 193.2 193 195 186  BMI 38.08 37.69 37.69 37.73 37.69 38.08 36.33   Foot/eye exam completion dates Latest Ref Rng & Units 11/04/2018 06/14/2018  Eye Exam No Retinopathy No Retinopathy No Retinopathy  Foot Form Completion - - -           Follow Up Instructions:    I discussed the assessment and treatment plan with the patient. The patient was provided an opportunity to ask questions and all were answered. The patient agreed with the plan and demonstrated an understanding of the instructions.   The patient was advised to call back or seek an in-person evaluation if the symptoms worsen or if the condition fails to improve as anticipated.  I provided 25 minutes of non-face-to-face time during this encounter.   Tula Nakayama, MD

## 2019-04-14 NOTE — Assessment & Plan Note (Signed)
Needs to increase exercise to improve  Hyperlipidemia:Low fat diet discussed and encouraged.   Lipid Panel  Lab Results  Component Value Date   CHOL 130 04/06/2019   HDL 42 (L) 04/06/2019   LDLCALC 74 04/06/2019   TRIG 64 04/06/2019   CHOLHDL 3.1 04/06/2019

## 2019-04-14 NOTE — Assessment & Plan Note (Signed)
Controlled, no change in medication DASH diet and commitment to daily physical activity for a minimum of 30 minutes discussed and encouraged, as a part of hypertension management. The importance of attaining a healthy weight is also discussed.  BP/Weight 04/14/2019 03/16/2019 01/13/2019 01/04/2019 12/27/2018 10/11/2018 68/34/1962  Systolic BP 229 798 921 194 174 081 448  Diastolic BP 75 75 80 77 84 78 80  Wt. (Lbs) 195 193 193 193.2 193 195 186  BMI 38.08 37.69 37.69 37.73 37.69 38.08 36.33

## 2019-04-17 ENCOUNTER — Other Ambulatory Visit: Payer: Self-pay | Admitting: Gastroenterology

## 2019-04-21 ENCOUNTER — Other Ambulatory Visit: Payer: Self-pay | Admitting: Family Medicine

## 2019-05-03 ENCOUNTER — Other Ambulatory Visit: Payer: Self-pay | Admitting: Family Medicine

## 2019-06-01 ENCOUNTER — Ambulatory Visit (INDEPENDENT_AMBULATORY_CARE_PROVIDER_SITE_OTHER): Payer: Medicare Other | Admitting: Family Medicine

## 2019-06-01 ENCOUNTER — Encounter (INDEPENDENT_AMBULATORY_CARE_PROVIDER_SITE_OTHER): Payer: Self-pay

## 2019-06-01 ENCOUNTER — Other Ambulatory Visit: Payer: Self-pay

## 2019-06-01 ENCOUNTER — Encounter: Payer: Self-pay | Admitting: Family Medicine

## 2019-06-01 VITALS — Ht 60.0 in | Wt 195.0 lb

## 2019-06-01 DIAGNOSIS — M545 Low back pain, unspecified: Secondary | ICD-10-CM

## 2019-06-01 MED ORDER — TIZANIDINE HCL 4 MG PO CAPS: 4.0000 mg | ORAL_CAPSULE | Freq: Every evening | ORAL | 0 refills | Status: DC | PRN

## 2019-06-01 NOTE — Patient Instructions (Signed)
Thank you for coming into the office today. I appreciate the opportunity to provide you with the care for your health and wellness. Today we discussed: back pain  Follow Up: as needed  No labs or referrals today.  Please read the attached information for back stretches.  Use tylenol extra strength during the day.  Use heating pads when sitting and laying these can help soothe the muscles.  (please be careful so you do not burn your skin)  Use a muscle rub for extra pain relief.  Please continue to practice social distancing to keep you, your family, and our community safe.  If you must go out, please wear a Mask and practice good handwashing.  [place] YOUR HANDS WELL AND FREQUENTLY. AVOID TOUCHING YOUR FACE, UNLESS YOUR HANDS ARE FRESHLY WASHED.  GET FRESH AIR DAILY. STAY HYDRATED WITH WATER.   It was a pleasure to see you and I look forward to continuing to work together on your health and well-being. Please do not hesitate to call the office if you need care or have questions about your care.  Have a wonderful day and week.  With Gratitude,  Cherly Beach, DNP, AGNP-BC    Back Exercises These exercises help to make your trunk and back strong. They also help to keep the lower back flexible. Doing these exercises can help to prevent back pain or lessen existing pain.  If you have back pain, try to do these exercises 2-3 times each day or as told by your doctor.  As you get better, do the exercises once each day. Repeat the exercises more often as told by your doctor.  To stop back pain from coming back, do the exercises once each day, or as told by your doctor. Exercises Single knee to chest Do these steps 3-5 times in a row for each leg: 1. Lie on your back on a firm bed or the floor with your legs stretched out. 2. Bring one knee to your chest. 3. Grab your knee or thigh with both hands and hold them it in place. 4. Pull on your knee until you feel a gentle stretch  in your lower back or buttocks. 5. Keep doing the stretch for 10-30 seconds. 6. Slowly let go of your leg and straighten it. Pelvic tilt Do these steps 5-10 times in a row: 1. Lie on your back on a firm bed or the floor with your legs stretched out. 2. Bend your knees so they point up to the ceiling. Your feet should be flat on the floor. 3. Tighten your lower belly (abdomen) muscles to press your lower back against the floor. This will make your tailbone point up to the ceiling instead of pointing down to your feet or the floor. 4. Stay in this position for 5-10 seconds while you gently tighten your muscles and breathe evenly. Cat-cow Do these steps until your lower back bends more easily: 1. Get on your hands and knees on a firm surface. Keep your hands under your shoulders, and keep your knees under your hips. You may put padding under your knees. 2. Let your head hang down toward your chest. Tighten (contract) the muscles in your belly. Point your tailbone toward the floor so your lower back becomes rounded like the back of a cat. 3. Stay in this position for 5 seconds. 4. Slowly lift your head. Let the muscles of your belly relax. Point your tailbone up toward the ceiling so your back forms a  sagging arch like the back of a cow. 5. Stay in this position for 5 seconds.  Press-ups Do these steps 5-10 times in a row: 1. Lie on your belly (face-down) on the floor. 2. Place your hands near your head, about shoulder-width apart. 3. While you keep your back relaxed and keep your hips on the floor, slowly straighten your arms to raise the top half of your body and lift your shoulders. Do not use your back muscles. You may change where you place your hands in order to make yourself more comfortable. 4. Stay in this position for 5 seconds. 5. Slowly return to lying flat on the floor.  Bridges Do these steps 10 times in a row: 1. Lie on your back on a firm surface. 2. Bend your knees so they  point up to the ceiling. Your feet should be flat on the floor. Your arms should be flat at your sides, next to your body. 3. Tighten your butt muscles and lift your butt off the floor until your waist is almost as high as your knees. If you do not feel the muscles working in your butt and the back of your thighs, slide your feet 1-2 inches farther away from your butt. 4. Stay in this position for 3-5 seconds. 5. Slowly lower your butt to the floor, and let your butt muscles relax. If this exercise is too easy, try doing it with your arms crossed over your chest. Belly crunches Do these steps 5-10 times in a row: 1. Lie on your back on a firm bed or the floor with your legs stretched out. 2. Bend your knees so they point up to the ceiling. Your feet should be flat on the floor. 3. Cross your arms over your chest. 4. Tip your chin a little bit toward your chest but do not bend your neck. 5. Tighten your belly muscles and slowly raise your chest just enough to lift your shoulder blades a tiny bit off of the floor. Avoid raising your body higher than that, because it can put too much stress on your low back. 6. Slowly lower your chest and your head to the floor. Back lifts Do these steps 5-10 times in a row: 1. Lie on your belly (face-down) with your arms at your sides, and rest your forehead on the floor. 2. Tighten the muscles in your legs and your butt. 3. Slowly lift your chest off of the floor while you keep your hips on the floor. Keep the back of your head in line with the curve in your back. Look at the floor while you do this. 4. Stay in this position for 3-5 seconds. 5. Slowly lower your chest and your face to the floor. Contact a doctor if:  Your back pain gets a lot worse when you do an exercise.  Your back pain does not get better 2 hours after you exercise. If you have any of these problems, stop doing the exercises. Do not do them again unless your doctor says it is okay. Get  help right away if:  You have sudden, very bad back pain. If this happens, stop doing the exercises. Do not do them again unless your doctor says it is okay. This information is not intended to replace advice given to you by your health care provider. Make sure you discuss any questions you have with your health care provider. Document Released: 11/22/2010 Document Revised: 07/15/2018 Document Reviewed: 07/15/2018 Elsevier Patient Education  2020 Elsevier  Inc.

## 2019-06-01 NOTE — Progress Notes (Signed)
Virtual Visit via Telephone Note   This visit type was conducted due to national recommendations for restrictions regarding the COVID-19 Pandemic (e.g. social distancing) in an effort to limit this patient's exposure and mitigate transmission in our community.  Due to her co-morbid illnesses, this patient is at least at moderate risk for complications without adequate follow up.  This format is felt to be most appropriate for this patient at this time.  The patient did not have access to video technology/had technical difficulties with video requiring transitioning to audio format only (telephone).  All issues noted in this document were discussed and addressed.  No physical exam could be performed with this format.    Evaluation Performed:  Follow-up visit  Date:  06/01/2019   ID:  Brayla, Pat 06-21-1966, MRN [id number]  Patient Location: Home Provider Location: Office  Location of Patient: Home Location of Provider: Telehealth Consent was obtain for visit to be over via telehealth. I verified that I am speaking with the correct person using two identifiers.  PCP:  Fayrene Helper, MD   Chief Complaint:  Back pain  History of Present Illness:    Kelli Spencer is a 53 y.o. female with   New onset of back pain for 5 days. This pain is located in the lower back without radiation. Pain score today during appt is rated 8/10. At its worse the pain is a level 10/10. It has started to disturb sleep and limiting movement. It is relieved by flexeril (previously had from back pain prior) It is aggravated by moving, sitting from laying, and standing from sitting.   There is no associated lower extremity numbness or weakness. There is no associated incontinence of stool or urine.  Has experienced before. Denies fall. She does report lifting two  24 pks of bottled water on Friday before the pain started.   The patient does not have symptoms concerning for COVID-19  infection (fever, chills, cough, or new shortness of breath).   Past Medical, Surgical, Social History, Allergies, and Medications have been Reviewed.   Past Medical History:  Diagnosis Date  . Arthritis   . Asthma   . Diabetes mellitus without complication ([place])   . Diastolic hypertension   . GERD (gastroesophageal reflux disease)   . Hyperlipidemia   . Hypertension   . Migraines   . Obesity   . Seizures ([place])    pt states she has seizures only when she gets upset. pt is taking no seizure meds   Past Surgical History:  Procedure Laterality Date  . ABDOMINAL HYSTERECTOMY  1997   fibroids  . COLONOSCOPY N/A 06/23/2016   Dr. Oneida Alar: prep not adequate to detect polyps less than 63m, redundant left colon. hemorrhoids. next TCS in 5 years.   . ESOPHAGOGASTRODUODENOSCOPY N/A 04/02/2018   Dr. fOneida Alar Benign-appearing esophageal stricture status post dilation, multiple benign gastric polyps, mild gastritis no H. pylori.  .Marland KitchenKNEE ARTHROSCOPY WITH MEDIAL MENISECTOMY Left 02/09/2014   Procedure: KNEE ARTHROSCOPY WITH MEDIAL MENISECTOMY;  Surgeon: SCarole Civil MD;  Location: [facility];  Service: Orthopedics;  Laterality: Left;  . PARTIAL HYSTERECTOMY    . SAVORY DILATION N/A 04/02/2018   Procedure: SAVORY DILATION;  Surgeon: FDanie Binder MD;  Location: [facility];  Service: Endoscopy;  Laterality: N/A;  . TOTAL KNEE ARTHROPLASTY Right 03/18/2017   Procedure: TOTAL KNEE ARTHROPLASTY;  Surgeon: HCarole Civil MD;  Location: [facility];  Service: Orthopedics;  Laterality: Right;  Current Meds  Medication Sig  . Accu-Chek Softclix Lancets lancets USE TO TEST blood sugar twice daily  Dx E11.65  . allopurinol (ZYLOPRIM) 300 MG tablet TAKE 1 TABLET(300 MG) BY MOUTH DAILY  . AMITIZA 24 MCG capsule TAKE 1 CAPSULE(24 MCG) BY MOUTH TWICE DAILY WITH A MEAL  . blood glucose meter kit and supplies Dispense based on patient and insurance preference. Once daily testing dx e11.9  .  cholecalciferol (VITAMIN D) 400 UNITS TABS tablet Take 800 Units by mouth daily.  . clotrimazole-betamethasone (LOTRISONE) cream Apply cream twice daily to rash for 1 week, then as needed  . glipiZIDE (GLUCOTROL) 10 MG tablet Take 1 tablet (10 mg total) by mouth 2 (two) times daily before a meal.  . glucose blood (ACCU-CHEK AVIVA PLUS) test strip USE TO TEST BLOOD SUGAR twice daily DX E11.65  . hydrochlorothiazide (MICROZIDE) 12.5 MG capsule TAKE 1 CAPSULE BY MOUTH DAILY  . hydrOXYzine (ATARAX/VISTARIL) 50 MG tablet TAKE 1 TABLET BY MOUTH AT BEDTIME FOR ITCHING OR ANXIETY  . pantoprazole (PROTONIX) 40 MG tablet Take 1 tablet (40 mg total) by mouth 2 (two) times daily before a meal.  . potassium chloride (K-DUR,KLOR-CON) 10 MEQ tablet TAKE 1 TABLET(10 MEQ) BY MOUTH DAILY  . pravastatin (PRAVACHOL) 10 MG tablet TAKE 1 TABLET(10 MG) BY MOUTH DAILY  . tizanidine (ZANAFLEX) 4 MG capsule Take 1 capsule (4 mg total) by mouth at bedtime as needed for muscle spasms.  . TRADJENTA 5 MG TABS tablet TAKE 1 TABLET(5 MG) BY MOUTH DAILY  . [DISCONTINUED] tizanidine (ZANAFLEX) 2 MG capsule One  Capsule at bedtime as needed, back spasm (Patient taking differently: Take 2 mg by mouth at bedtime as needed for muscle spasms. )     Allergies:   Prednisone, Amlodipine, Ace inhibitors, and Metformin and related   Social History   Tobacco Use  . Smoking status: Never Smoker  . Smokeless tobacco: Never Used  Substance Use Topics  . Alcohol use: No  . Drug use: No     Family Hx: The patient's family history includes Diabetes in her father and mother; Heart disease in her father; Hypertension in her mother and sister; Kidney failure in her mother; Stroke in her mother. There is no history of Colon cancer.  ROS:   Please see the history of present illness.    All other systems reviewed and are negative.   Labs/Other Tests and Data Reviewed:    Recent Labs: 01/07/2019: TSH 3.52 04/06/2019: ALT 13; BUN 19; Creat  1.20; Hemoglobin 12.5; Platelets 319; Potassium 4.5; Sodium 140   Recent Lipid Panel Lab Results  Component Value Date/Time   CHOL 130 04/06/2019 11:00 AM   TRIG 64 04/06/2019 11:00 AM   HDL 42 (L) 04/06/2019 11:00 AM   CHOLHDL 3.1 04/06/2019 11:00 AM   LDLCALC 74 04/06/2019 11:00 AM    Wt Readings from Last 3 Encounters:  06/01/19 195 lb (88.5 kg)  04/14/19 195 lb (88.5 kg)  03/16/19 193 lb (87.5 kg)     Objective:    Vital Signs:  Ht 5' (1.524 m)   Wt 195 lb (88.5 kg)   LMP 06/10/1988 (Approximate)   BMI 38.08 kg/m    GEN:  no acute distress RESPIRATORY:  no shortness of breasth noted in conversation PSYCH:  normal mood and affect  ASSESSMENT & PLAN:    1. Acute bilateral low back pain without sciatica S&S are consistent with back strain, related to lifting improperly. Provided education on back pain  treatment in [facility]. Hand out for stretches mailed with [facility]S Will give a increase in zanaflex for this month. However, I would like to avoid further months if not better. Reviewed side effects, risks and benefits of medication.   Patient acknowledged agreement and understanding of the plan.   - tizanidine (ZANAFLEX) 4 MG capsule; Take 1 capsule (4 mg total) by mouth at bedtime as needed for muscle spasms.  Dispense: 30 capsule; Refill: 0  Time:   Today, I have spent 10 minutes with the patient with telehealth technology discussing the above problems.     Medication Adjustments/Labs and Tests Ordered: Current medicines are reviewed at length with the patient today.  Concerns regarding medicines are outlined above.   Tests Ordered: No orders of the defined types were placed in this encounter.   Medication Changes: Meds ordered this encounter  Medications  . tizanidine (ZANAFLEX) 4 MG capsule    Sig: Take 1 capsule (4 mg total) by mouth at bedtime as needed for muscle spasms.    Dispense:  30 capsule    Refill:  0    Order Specific Question:   Supervising Provider     Answer:   Fayrene Helper [[id number]]    Disposition:  Follow up prn  Signed, Perlie Mayo, NP  06/01/2019 9:20 AM     [facility]

## 2019-06-06 ENCOUNTER — Other Ambulatory Visit: Payer: Self-pay | Admitting: Family Medicine

## 2019-06-06 DIAGNOSIS — E785 Hyperlipidemia, unspecified: Secondary | ICD-10-CM

## 2019-06-23 ENCOUNTER — Other Ambulatory Visit: Payer: Self-pay | Admitting: Family Medicine

## 2019-06-23 ENCOUNTER — Telehealth: Payer: Self-pay | Admitting: *Deleted

## 2019-06-23 DIAGNOSIS — E669 Obesity, unspecified: Secondary | ICD-10-CM

## 2019-06-23 DIAGNOSIS — E1169 Type 2 diabetes mellitus with other specified complication: Secondary | ICD-10-CM

## 2019-06-23 NOTE — Telephone Encounter (Signed)
Spoke with patient and let her know provider needs a letter social security office stating exactly what is needed before she can provide letter she is needing. Patient stated ss office is supposed to be sending her some paperwork and she will bring them to [place] when she gets them.

## 2019-06-23 NOTE — Telephone Encounter (Signed)
Please advise 

## 2019-06-23 NOTE — Telephone Encounter (Signed)
Pt called she has lost her social security card and the only way she can get another one is to have proof its hers. She said they told her she could get another one but she would have to have a certified note from her dr with her date of birth and name etc on it. She was wondering if Dr. Moshe Cipro would give her a note so she could get a new card

## 2019-06-23 NOTE — Telephone Encounter (Signed)
Need letter from [facility] specifying what is needed before I can agree to provide, pls let her know

## 2019-07-13 ENCOUNTER — Other Ambulatory Visit: Payer: Self-pay

## 2019-07-13 DIAGNOSIS — M545 Low back pain, unspecified: Secondary | ICD-10-CM

## 2019-07-13 MED ORDER — TIZANIDINE HCL 4 MG PO CAPS: 4.0000 mg | ORAL_CAPSULE | Freq: Every evening | ORAL | 0 refills | Status: DC | PRN

## 2019-07-20 DIAGNOSIS — E1169 Type 2 diabetes mellitus with other specified complication: Secondary | ICD-10-CM | POA: Diagnosis not present

## 2019-07-21 LAB — BASIC METABOLIC PANEL WITH GFR
BUN/Creatinine Ratio: 19 (calc) (ref 6–22)
BUN: 24 mg/dL (ref 7–25)
CO2: 27 mmol/L (ref 20–32)
Calcium: 9.9 mg/dL (ref 8.6–10.4)
Chloride: 101 mmol/L (ref 98–110)
Creat: 1.29 mg/dL — ABNORMAL HIGH (ref 0.50–1.05)
GFR, Est African American: 54 mL/min/{1.73_m2} — ABNORMAL LOW (ref >=60)
GFR, Est Non African American: 47 mL/min/{1.73_m2} — ABNORMAL LOW (ref >=60)
Glucose, Bld: 180 mg/dL — ABNORMAL HIGH (ref 65–99)
Potassium: 4.3 mmol/L (ref 3.5–5.3)
Sodium: 140 mmol/L (ref 135–146)

## 2019-07-21 LAB — HEMOGLOBIN A1C
Hgb A1c MFr Bld: 8.3 % of total Hgb — ABNORMAL HIGH (ref <5.7)
Mean Plasma Glucose: 192 (calc)
eAG (mmol/L): 10.6 (calc)

## 2019-07-25 ENCOUNTER — Other Ambulatory Visit: Payer: Self-pay

## 2019-07-25 ENCOUNTER — Encounter: Payer: Self-pay | Admitting: Family Medicine

## 2019-07-25 ENCOUNTER — Ambulatory Visit (INDEPENDENT_AMBULATORY_CARE_PROVIDER_SITE_OTHER): Payer: Medicare Other | Admitting: Family Medicine

## 2019-07-25 VITALS — BP 124/82 | HR 82 | Temp 98.0°F | Resp 15 | Ht 60.0 in | Wt 194.0 lb

## 2019-07-25 DIAGNOSIS — IMO0002 Reserved for concepts with insufficient information to code with codable children: Secondary | ICD-10-CM

## 2019-07-25 DIAGNOSIS — E1121 Type 2 diabetes mellitus with diabetic nephropathy: Secondary | ICD-10-CM

## 2019-07-25 DIAGNOSIS — E1165 Type 2 diabetes mellitus with hyperglycemia: Secondary | ICD-10-CM | POA: Diagnosis not present

## 2019-07-25 DIAGNOSIS — Z0000 Encounter for general adult medical examination without abnormal findings: Secondary | ICD-10-CM

## 2019-07-25 DIAGNOSIS — Z2821 Immunization not carried out because of patient refusal: Secondary | ICD-10-CM

## 2019-07-25 DIAGNOSIS — E1159 Type 2 diabetes mellitus with other circulatory complications: Secondary | ICD-10-CM

## 2019-07-25 MED ORDER — HYDROCHLOROTHIAZIDE 12.5 MG PO CAPS: 12.5000 mg | ORAL_CAPSULE | Freq: Every day | ORAL | 0 refills | Status: DC

## 2019-07-25 NOTE — Progress Notes (Signed)
Kelli Spencer     MRN: [id number]      DOB: 25-Sep-1966  HPI: Patient is in for annual physical exam. No other health concerns are expressed or addressed at the visit. Recent labs, if available are reviewed. Immunization is reviewed , and  updated if needed.   PE: BP 124/82   Pulse 82   Temp 98 F (36.7 C) (Temporal)   Resp 15   Ht 5' (1.524 m)   Wt 194 lb (88 kg)   LMP 06/10/1988 (Approximate)   SpO2 98%   BMI 37.89 kg/m   Pleasant  female, alert and oriented x 3, in no cardio-pulmonary distress. Afebrile. HEENT No facial trauma or asymetry. Sinuses non tender.  Extra occullar muscles intact, External ears normal, . Neck: supple, no adenopathy,JVD or thyromegaly.No bruits.  Chest: Clear to ascultation bilaterally.No crackles or wheezes. Non tender to palpation  Breast: Not examined  Cardiovascular system; Heart sounds normal,  S1 and  S2 ,no S3.  No murmur, or thrill. Apical beat not displaced Peripheral pulses normal.  Abdomen: Soft, non tender, no organomegaly or masses.  No guarding, tenderness or rebound.   Musculoskeletal exam: Decreased  ROM of spine, hips , shoulders and knees. deformity ,swelling and  crepitus noted. No muscle wasting or atrophy.   Neurologic: Cranial nerves 2 to 12 intact. Power, tone ,sensation and reflexes normal throughout. No disturbance in gait. No tremor.  Skin: Intact, no ulceration, erythema , scaling or rash noted. Pigmentation normal throughout  Psych; Normal mood and affect. Judgement and concentration normal   Assessment & Plan:  Annual physical exam Annual exam as documented. Counseling done  re healthy lifestyle involving commitment to 150 minutes exercise per week, heart healthy diet, and attaining healthy weight.The importance of adequate sleep also discussed. Regular seat belt use and home safety, is also discussed. Changes in health habits are decided on by the patient with goals and time frames   set for achieving them. Immunization and cancer screening needs are specifically addressed at this visit.   Immunization refused Offered, re educated, and again patient refused flu vaccine  Type 2 diabetes mellitus with vascular disease ([place]) Kelli Spencer is reminded of the importance of commitment to daily physical activity for 30 minutes or more, as able and the need to limit carbohydrate intake to 30 to 60 grams per meal to help with blood sugar control.   The need to take medication as prescribed, test blood sugar as directed, and to call between visits if there is a concern that blood sugar is uncontrolled is also discussed.   Kelli Spencer is reminded of the importance of daily foot exam, annual eye examination, and good blood sugar, blood pressure and cholesterol control. Deteriorated and uncontrolled  Diabetic Labs Latest Ref Rng & Units 07/20/2019 04/06/2019 01/07/2019 09/07/2018 05/26/2018  HbA1c <5.7 % of total Hgb 8.3(H) 7.6(H) 9.8(H) 7.7(H) 8.4(H)  Microalbumin mg/dL - - - 0.9 -  Micro/Creat Ratio <30 mcg/mg creat - - - - -  Chol <200 mg/dL - 130 - 135 -  HDL > OR = 50 mg/dL - 42(L) - 42(L) -  Calc LDL mg/dL (calc) - 74 - 80 -  Triglycerides <150 mg/dL - 64 - 57 -  Creatinine 0.50 - 1.05 mg/dL 1.29(H) 1.20(H) 1.18(H) 1.38(H) 1.22(H)   BP/Weight 07/25/2019 06/01/2019 04/14/2019 03/16/2019 01/13/2019 01/04/2019 [id number]  Systolic BP A999333 - 0000000 0000000 [id number]  Diastolic BP 82 - 75 75 80 77 84  Wt. (  Lbs) 194 195 195 193 193 193.2 193  BMI 37.89 38.08 38.08 37.69 37.69 37.73 37.69   Foot/eye exam completion dates Latest Ref Rng & Units 07/25/2019 11/04/2018  Eye Exam No Retinopathy - No Retinopathy  Foot Form Completion - Done -        Morbid obesity ([place])  Patient re-educated about  the importance of commitment to a  minimum of 150 minutes of exercise per week as able.  The importance of healthy food choices with portion control discussed, as well as eating regularly and within a 12  hour window most days. The need to choose "clean , green" food 50 to 75% of the time is discussed, as well as to make water the primary drink and set a goal of 64 ounces water daily.    Weight /BMI 07/25/2019 06/01/2019 04/14/2019  WEIGHT 194 lb 195 lb 195 lb  HEIGHT 5\' 0"  5\' 0"  5\' 0"   BMI 37.89 kg/m2 38.08 kg/m2 38.08 kg/m2

## 2019-07-25 NOTE — Patient Instructions (Addendum)
Non fasting HBA1C, chem7 and EGFr and microalb Dec 16 or after  F/u in office with MD mid March 2021, call if you need me sooner  Nurse to provide for patient to return 3 stool cards for FOB testing asap  It is important that you exercise regularly at least 30 minutes 5 times a week. If you develop chest pain, have severe difficulty breathing, or feel very tired, stop exercising immediately and seek medical attention    Think about what you will eat, plan ahead. Choose " clean, green, fresh or frozen" over canned, processed or packaged foods which are more sugary, salty and fatty. 70 to 75% of food eaten should be vegetables and fruit. Three meals at set times with snacks allowed between meals, but they must be fruit or vegetables. Aim to eat over a 12 hour period , example 7 am to 7 pm, and STOP after  your last meal of the day. Drink water,generally about 64 ounces per day, no other drink is as healthy. Fruit juice is best enjoyed in a healthy way, by EATING the fruit.  LIMIT and count carbs, your blood sugar is now uncontrolled  Examine feet daily, today's exam is normal

## 2019-07-30 ENCOUNTER — Encounter: Payer: Self-pay | Admitting: Family Medicine

## 2019-07-30 NOTE — Assessment & Plan Note (Signed)
Kelli Spencer is reminded of the importance of commitment to daily physical activity for 30 minutes or more, as able and the need to limit carbohydrate intake to 30 to 60 grams per meal to help with blood sugar control.   The need to take medication as prescribed, test blood sugar as directed, and to call between visits if there is a concern that blood sugar is uncontrolled is also discussed.   Kelli Spencer is reminded of the importance of daily foot exam, annual eye examination, and good blood sugar, blood pressure and cholesterol control. Deteriorated and uncontrolled  Diabetic Labs Latest Ref Rng & Units 07/20/2019 04/06/2019 01/07/2019 09/07/2018 05/26/2018  HbA1c <5.7 % of total Hgb 8.3(H) 7.6(H) 9.8(H) 7.7(H) 8.4(H)  Microalbumin mg/dL - - - 0.9 -  Micro/Creat Ratio <30 mcg/mg creat - - - - -  Chol <200 mg/dL - 130 - 135 -  HDL > OR = 50 mg/dL - 42(L) - 42(L) -  Calc LDL mg/dL (calc) - 74 - 80 -  Triglycerides <150 mg/dL - 64 - 57 -  Creatinine 0.50 - 1.05 mg/dL 1.29(H) 1.20(H) 1.18(H) 1.38(H) 1.22(H)   BP/Weight 07/25/2019 06/01/2019 04/14/2019 03/16/2019 01/13/2019 01/04/2019 [id number]  Systolic BP A999333 - 0000000 0000000 123456 [id number]  Diastolic BP 82 - 75 75 80 77 84  Wt. (Lbs) 194 195 195 193 193 193.2 193  BMI 37.89 38.08 38.08 37.69 37.69 37.73 37.69   Foot/eye exam completion dates Latest Ref Rng & Units 07/25/2019 11/04/2018  Eye Exam No Retinopathy - No Retinopathy  Foot Form Completion - Done -

## 2019-07-30 NOTE — Assessment & Plan Note (Signed)

## 2019-07-30 NOTE — Assessment & Plan Note (Signed)
  Patient re-educated about  the importance of commitment to a  minimum of 150 minutes of exercise per week as able.  The importance of healthy food choices with portion control discussed, as well as eating regularly and within a 12 hour window most days. The need to choose "clean , green" food 50 to 75% of the time is discussed, as well as to make water the primary drink and set a goal of 64 ounces water daily.    Weight /BMI 07/25/2019 06/01/2019 04/14/2019  WEIGHT 194 lb 195 lb 195 lb  HEIGHT 5\' 0"  5\' 0"  5\' 0"   BMI 37.89 kg/m2 38.08 kg/m2 38.08 kg/m2

## 2019-07-30 NOTE — Assessment & Plan Note (Signed)
Offered, re educated, and again patient refused flu vaccine

## 2019-08-08 ENCOUNTER — Other Ambulatory Visit (INDEPENDENT_AMBULATORY_CARE_PROVIDER_SITE_OTHER): Payer: Medicare Other

## 2019-08-08 DIAGNOSIS — Z1211 Encounter for screening for malignant neoplasm of colon: Secondary | ICD-10-CM

## 2019-08-08 LAB — HEMOCCULT GUIAC POC 1CARD (OFFICE)
Card #2 Fecal Occult Blod, POC: NEGATIVE
Card #3 Fecal Occult Blood, POC: NEGATIVE
Fecal Occult Blood, POC: NEGATIVE

## 2019-09-20 ENCOUNTER — Other Ambulatory Visit: Payer: Self-pay

## 2019-09-20 DIAGNOSIS — E1169 Type 2 diabetes mellitus with other specified complication: Secondary | ICD-10-CM

## 2019-09-20 MED ORDER — LINAGLIPTIN 5 MG PO TABS: ORAL_TABLET | ORAL | 0 refills | Status: DC

## 2019-09-28 ENCOUNTER — Other Ambulatory Visit: Payer: Self-pay | Admitting: Gastroenterology

## 2019-10-19 ENCOUNTER — Other Ambulatory Visit: Payer: Self-pay | Admitting: Family Medicine

## 2019-10-23 ENCOUNTER — Other Ambulatory Visit: Payer: Self-pay | Admitting: Family Medicine

## 2019-10-23 DIAGNOSIS — E1121 Type 2 diabetes mellitus with diabetic nephropathy: Secondary | ICD-10-CM

## 2019-10-23 DIAGNOSIS — IMO0002 Reserved for concepts with insufficient information to code with codable children: Secondary | ICD-10-CM

## 2019-10-26 DIAGNOSIS — E1121 Type 2 diabetes mellitus with diabetic nephropathy: Secondary | ICD-10-CM | POA: Diagnosis not present

## 2019-10-26 DIAGNOSIS — E1165 Type 2 diabetes mellitus with hyperglycemia: Secondary | ICD-10-CM | POA: Diagnosis not present

## 2019-10-27 LAB — MICROALBUMIN / CREATININE URINE RATIO
Creatinine, Urine: 146 mg/dL (ref 20–275)
Microalb Creat Ratio: 5 mcg/mg creat (ref <30)
Microalb, Ur: 0.8 mg/dL

## 2019-10-27 LAB — HEMOGLOBIN A1C
Hgb A1c MFr Bld: 8.8 % of total Hgb — ABNORMAL HIGH (ref <5.7)
Mean Plasma Glucose: 206 (calc)
eAG (mmol/L): 11.4 (calc)

## 2019-10-27 LAB — BASIC METABOLIC PANEL WITH GFR
BUN/Creatinine Ratio: 19 (calc) (ref 6–22)
BUN: 23 mg/dL (ref 7–25)
CO2: 28 mmol/L (ref 20–32)
Calcium: 10.1 mg/dL (ref 8.6–10.4)
Chloride: 100 mmol/L (ref 98–110)
Creat: 1.21 mg/dL — ABNORMAL HIGH (ref 0.50–1.05)
GFR, Est African American: 58 mL/min/{1.73_m2} — ABNORMAL LOW (ref >=60)
GFR, Est Non African American: 50 mL/min/{1.73_m2} — ABNORMAL LOW (ref >=60)
Glucose, Bld: 202 mg/dL — ABNORMAL HIGH (ref 65–99)
Potassium: 4.4 mmol/L (ref 3.5–5.3)
Sodium: 141 mmol/L (ref 135–146)

## 2019-11-08 ENCOUNTER — Encounter: Payer: Self-pay | Admitting: Family Medicine

## 2019-11-08 ENCOUNTER — Ambulatory Visit (INDEPENDENT_AMBULATORY_CARE_PROVIDER_SITE_OTHER): Payer: Medicare Other | Admitting: Family Medicine

## 2019-11-08 ENCOUNTER — Other Ambulatory Visit: Payer: Self-pay

## 2019-11-08 ENCOUNTER — Encounter (INDEPENDENT_AMBULATORY_CARE_PROVIDER_SITE_OTHER): Payer: Self-pay

## 2019-11-08 VITALS — BP 108/84 | Ht 60.0 in | Wt 179.2 lb

## 2019-11-08 DIAGNOSIS — Z6835 Body mass index (BMI) 35.0-35.9, adult: Secondary | ICD-10-CM

## 2019-11-08 DIAGNOSIS — E1165 Type 2 diabetes mellitus with hyperglycemia: Secondary | ICD-10-CM

## 2019-11-08 DIAGNOSIS — E1121 Type 2 diabetes mellitus with diabetic nephropathy: Secondary | ICD-10-CM | POA: Diagnosis not present

## 2019-11-08 DIAGNOSIS — I10 Essential (primary) hypertension: Secondary | ICD-10-CM

## 2019-11-08 DIAGNOSIS — IMO0002 Reserved for concepts with insufficient information to code with codable children: Secondary | ICD-10-CM

## 2019-11-08 DIAGNOSIS — Z2821 Immunization not carried out because of patient refusal: Secondary | ICD-10-CM

## 2019-11-08 DIAGNOSIS — E785 Hyperlipidemia, unspecified: Secondary | ICD-10-CM | POA: Diagnosis not present

## 2019-11-08 MED ORDER — BLOOD GLUCOSE METER KIT: PACK | 0 refills | Status: AC

## 2019-11-08 MED ORDER — METFORMIN HCL 500 MG PO TABS: 500.0000 mg | ORAL_TABLET | Freq: Two times a day (BID) | ORAL | 3 refills | Status: DC

## 2019-11-08 NOTE — Patient Instructions (Addendum)
F/U phone visit with MD in 5 weeks, pt to have blood sugars fasting to report AT [place]  TEST ONCE DAILY AND HAVE RECORD FOR VISIT  Fasting lipid, c mp and eGFr in 4 weeks  New meter with once daily testing supplies is prectribed today also  You are referred for eye eam, please call for your appointment Thanks for choosing [facility], we consider it a privelige to serve you.  It is important that you exercise regularly at least 30 minutes 5 times a week. If you develop chest pain, have severe difficulty breathing, or feel very tired, stop exercising immediately and seek medical attention    Think about what you will eat, plan ahead. Choose " clean, green, fresh or frozen" over canned, processed or packaged foods which are more sugary, salty and fatty. 70 to 75% of food eaten should be vegetables and fruit. Three meals at set times with snacks allowed between meals, but they must be fruit or vegetables. Aim to eat over a 12 hour period , example 7 am to 7 pm, and STOP after  your last meal of the day. Drink water,generally about 64 ounces per day, no other drink is as healthy. Fruit juice is best enjoyed in a healthy way, by EATING the fruit. BEST for 2021!

## 2019-11-08 NOTE — Assessment & Plan Note (Signed)
Deteriorated add metformin twice daily Kelli Spencer is reminded of the importance of commitment to daily physical activity for 30 minutes or more, as able and the need to limit carbohydrate intake to 30 to 60 grams per meal to help with blood sugar control.   The need to take medication as prescribed, test blood sugar as directed, and to call between visits if there is a concern that blood sugar is uncontrolled is also discussed.   Kelli Spencer is reminded of the importance of daily foot exam, annual eye examination, and good blood sugar, blood pressure and cholesterol control.  Diabetic Labs Latest Ref Rng & Units 10/26/2019 07/20/2019 04/06/2019 01/07/2019 09/07/2018  HbA1c <5.7 % of total Hgb 8.8(H) 8.3(H) 7.6(H) 9.8(H) 7.7(H)  Microalbumin mg/dL 0.8 - - - 0.9  Micro/Creat Ratio <30 mcg/mg creat 5 - - - -  Chol <200 mg/dL - - 130 - 135  HDL > OR = 50 mg/dL - - 42(L) - 42(L)  Calc LDL mg/dL (calc) - - 74 - 80  Triglycerides <150 mg/dL - - 64 - 57  Creatinine 0.50 - 1.05 mg/dL 1.21(H) 1.29(H) 1.20(H) 1.18(H) 1.38(H)   BP/Weight 11/08/2019 07/25/2019 06/01/2019 04/14/2019 03/16/2019 [id number]  Systolic BP 123XX123 A999333 - 0000000 0000000 123456 [id number]  Diastolic BP 84 82 - 75 75 80 77  Wt. (Lbs) 179.2 194 195 195 193 193 193.2  BMI 35 37.89 38.08 38.08 37.69 37.69 37.73   Foot/eye exam completion dates Latest Ref Rng & Units 07/25/2019 11/04/2018  Eye Exam No Retinopathy - No Retinopathy  Foot Form Completion - Done -  refer eye exam

## 2019-11-08 NOTE — Assessment & Plan Note (Signed)
Uncontrolled add metformin low dose Follow diet more closely, still resists diabetic education  Start daily testing, obtain new test supplies, return visit with numbers in 4 weeks Kelli Spencer is reminded of the importance of commitment to daily physical activity for 30 minutes or more, as able and the need to limit carbohydrate intake to 30 to 60 grams per meal to help with blood sugar control.   The need to take medication as prescribed, test blood sugar as directed, and to call between visits if there is a concern that blood sugar is uncontrolled is also discussed.   Kelli Spencer is reminded of the importance of daily foot exam, annual eye examination, and good blood sugar, blood pressure and cholesterol control.  Diabetic Labs Latest Ref Rng & Units 10/26/2019 07/20/2019 04/06/2019 01/07/2019 09/07/2018  HbA1c <5.7 % of total Hgb 8.8(H) 8.3(H) 7.6(H) 9.8(H) 7.7(H)  Microalbumin mg/dL 0.8 - - - 0.9  Micro/Creat Ratio <30 mcg/mg creat 5 - - - -  Chol <200 mg/dL - - 130 - 135  HDL > OR = 50 mg/dL - - 42(L) - 42(L)  Calc LDL mg/dL (calc) - - 74 - 80  Triglycerides <150 mg/dL - - 64 - 57  Creatinine 0.50 - 1.05 mg/dL 1.21(H) 1.29(H) 1.20(H) 1.18(H) 1.38(H)   BP/Weight 11/08/2019 07/25/2019 06/01/2019 04/14/2019 03/16/2019 [id number]  Systolic BP 123XX123 A999333 - 0000000 0000000 123456 [id number]  Diastolic BP 84 82 - 75 75 80 77  Wt. (Lbs) 179.2 194 195 195 193 193 193.2  BMI 35 37.89 38.08 38.08 37.69 37.69 37.73   Foot/eye exam completion dates Latest Ref Rng & Units 07/25/2019 11/04/2018  Eye Exam No Retinopathy - No Retinopathy  Foot Form Completion - Done -

## 2019-11-08 NOTE — Assessment & Plan Note (Signed)
Again refuses flu and pneumonia vaccines

## 2019-11-08 NOTE — Assessment & Plan Note (Signed)
Obesity linked with diabetes and hypertension  Patient re-educated about  the importance of commitment to a  minimum of 150 minutes of exercise per week as able.  The importance of healthy food choices with portion control discussed, as well as eating regularly and within a 12 hour window most days. The need to choose "clean , green" food 50 to 75% of the time is discussed, as well as to make water the primary drink and set a goal of 64 ounces water daily.    Weight /BMI 11/08/2019 07/25/2019 06/01/2019  WEIGHT 179 lb 3.2 oz 194 lb 195 lb  HEIGHT 5\' 0"  5\' 0"  5\' 0"   BMI 35 kg/m2 37.89 kg/m2 38.08 kg/m2

## 2019-11-08 NOTE — Progress Notes (Signed)
Virtual Visit via Telephone Note  I connected with Kelli Spencer on 11/08/19 at  9:20 AM EST by telephone and verified that I am speaking with the correct person using two identifiers.  Location: Patient: home Provider: office   I discussed the limitations, risks, security and privacy concerns of performing an evaluation and management service by telephone and the availability of in person appointments. I also discussed with the patient that there may be a patient responsible charge related to this service. The patient expressed understanding and agreed to proceed.   History of Present Illness: F/U uncontrolled diabetes, unfortunately has not been testing, states meter is non functional. Denies polyuria, polydipsia, blurred vision , or hypoglycemic episodes. Still working hard on appropriate dietary change to improve blood sugars, she is saddened that her blood sugar has deteriorated rather than improved No regular exercise and is somewhat limited because of severe arthritis in the knee Denies polyuria, polydipsia, blurred vision , or hypoglycemic episodes.    Observations/Objective: BP 108/84   Ht 5' (1.524 m)   Wt 179 lb 3.2 oz (81.3 kg)   LMP 06/10/1988 (Approximate)   BMI 35.00 kg/m   Good communication with no confusion and intact memory. Alert and oriented x 3 No signs of respiratory distress during speech     Assessment and Plan: Type 2 diabetes mellitus with vascular disease ([place]) Deteriorated add metformin twice daily Ms. Fichtner is reminded of the importance of commitment to daily physical activity for 30 minutes or more, as able and the need to limit carbohydrate intake to 30 to 60 grams per meal to help with blood sugar control.   The need to take medication as prescribed, test blood sugar as directed, and to call between visits if there is a concern that blood sugar is uncontrolled is also discussed.   Ms. Provence is reminded of the importance of daily foot exam,  annual eye examination, and good blood sugar, blood pressure and cholesterol control.  Diabetic Labs Latest Ref Rng & Units 10/26/2019 07/20/2019 04/06/2019 01/07/2019 09/07/2018  HbA1c <5.7 % of total Hgb 8.8(H) 8.3(H) 7.6(H) 9.8(H) 7.7(H)  Microalbumin mg/dL 0.8 - - - 0.9  Micro/Creat Ratio <30 mcg/mg creat 5 - - - -  Chol <200 mg/dL - - 130 - 135  HDL > OR = 50 mg/dL - - 42(L) - 42(L)  Calc LDL mg/dL (calc) - - 74 - 80  Triglycerides <150 mg/dL - - 64 - 57  Creatinine 0.50 - 1.05 mg/dL 1.21(H) 1.29(H) 1.20(H) 1.18(H) 1.38(H)   BP/Weight 11/08/2019 07/25/2019 06/01/2019 04/14/2019 03/16/2019 [id number]  Systolic BP 123XX123 A999333 - 0000000 0000000 123456 [id number]  Diastolic BP 84 82 - 75 75 80 77  Wt. (Lbs) 179.2 194 195 195 193 193 193.2  BMI 35 37.89 38.08 38.08 37.69 37.69 37.73   Foot/eye exam completion dates Latest Ref Rng & Units 07/25/2019 11/04/2018  Eye Exam No Retinopathy - No Retinopathy  Foot Form Completion - Done -  refer eye exam      Uncontrolled type 2 diabetes mellitus with diabetic nephropathy, without long-term current use of insulin ([facility]) Uncontrolled add metformin low dose Follow diet more closely, still resists diabetic education  Start daily testing, obtain new test supplies, return visit with numbers in 4 weeks Ms. Brieger is reminded of the importance of commitment to daily physical activity for 30 minutes or more, as able and the need to limit carbohydrate intake to 30 to 60 grams per meal to help with blood  sugar control.   The need to take medication as prescribed, test blood sugar as directed, and to call between visits if there is a concern that blood sugar is uncontrolled is also discussed.   Ms. Basse is reminded of the importance of daily foot exam, annual eye examination, and good blood sugar, blood pressure and cholesterol control.  Diabetic Labs Latest Ref Rng & Units 10/26/2019 07/20/2019 04/06/2019 01/07/2019 09/07/2018  HbA1c <5.7 % of total Hgb 8.8(H) 8.3(H) 7.6(H) 9.8(H)  7.7(H)  Microalbumin mg/dL 0.8 - - - 0.9  Micro/Creat Ratio <30 mcg/mg creat 5 - - - -  Chol <200 mg/dL - - 130 - 135  HDL > OR = 50 mg/dL - - 42(L) - 42(L)  Calc LDL mg/dL (calc) - - 74 - 80  Triglycerides <150 mg/dL - - 64 - 57  Creatinine 0.50 - 1.05 mg/dL 1.21(H) 1.29(H) 1.20(H) 1.18(H) 1.38(H)   BP/Weight 11/08/2019 07/25/2019 06/01/2019 04/14/2019 03/16/2019 [id number]  Systolic BP 123XX123 A999333 - 0000000 0000000 123456 [id number]  Diastolic BP 84 82 - 75 75 80 77  Wt. (Lbs) 179.2 194 195 195 193 193 193.2  BMI 35 37.89 38.08 38.08 37.69 37.69 37.73   Foot/eye exam completion dates Latest Ref Rng & Units 07/25/2019 11/04/2018  Eye Exam No Retinopathy - No Retinopathy  Foot Form Completion - Done -        Immunization refused Again refuses flu and pneumonia vaccines  Morbid obesity ([place]) Obesity linked with diabetes and hypertension  Patient re-educated about  the importance of commitment to a  minimum of 150 minutes of exercise per week as able.  The importance of healthy food choices with portion control discussed, as well as eating regularly and within a 12 hour window most days. The need to choose "clean , green" food 50 to 75% of the time is discussed, as well as to make water the primary drink and set a goal of 64 ounces water daily.    Weight /BMI 11/08/2019 07/25/2019 06/01/2019  WEIGHT 179 lb 3.2 oz 194 lb 195 lb  HEIGHT 5\' 0"  5\' 0"  5\' 0"   BMI 35 kg/m2 37.89 kg/m2 38.08 kg/m2        Follow Up Instructions:    I discussed the assessment and treatment plan with the patient. The patient was provided an opportunity to ask questions and all were answered. The patient agreed with the plan and demonstrated an understanding of the instructions.   The patient was advised to call back or seek an in-person evaluation if the symptoms worsen or if the condition fails to improve as anticipated.  I provided 20 minutes of non-face-to-face time during this encounter.   Tula Nakayama,  MD

## 2019-11-21 ENCOUNTER — Other Ambulatory Visit: Payer: Self-pay

## 2019-11-21 MED ORDER — ALLOPURINOL 300 MG PO TABS: ORAL_TABLET | ORAL | 6 refills | Status: DC

## 2019-12-06 DIAGNOSIS — E785 Hyperlipidemia, unspecified: Secondary | ICD-10-CM | POA: Diagnosis not present

## 2019-12-06 DIAGNOSIS — I10 Essential (primary) hypertension: Secondary | ICD-10-CM | POA: Diagnosis not present

## 2019-12-07 LAB — LIPID PANEL
Cholesterol: 131 mg/dL (ref <200)
HDL: 45 mg/dL — ABNORMAL LOW (ref >=50)
LDL Cholesterol (Calc): 72 mg/dL (calc)
Non-HDL Cholesterol (Calc): 86 mg/dL (calc) (ref <130)
Total CHOL/HDL Ratio: 2.9 (calc) (ref <5.0)
Triglycerides: 62 mg/dL (ref <150)

## 2019-12-07 LAB — COMPLETE METABOLIC PANEL WITH GFR
AG Ratio: 1.3 (calc) (ref 1.0–2.5)
ALT: 13 U/L (ref 6–29)
AST: 11 U/L (ref 10–35)
Albumin: 4.4 g/dL (ref 3.6–5.1)
Alkaline phosphatase (APISO): 103 U/L (ref 37–153)
BUN/Creatinine Ratio: 18 (calc) (ref 6–22)
BUN: 23 mg/dL (ref 7–25)
CO2: 27 mmol/L (ref 20–32)
Calcium: 10.4 mg/dL (ref 8.6–10.4)
Chloride: 101 mmol/L (ref 98–110)
Creat: 1.3 mg/dL — ABNORMAL HIGH (ref 0.50–1.05)
GFR, Est African American: 53 mL/min/{1.73_m2} — ABNORMAL LOW (ref >=60)
GFR, Est Non African American: 46 mL/min/{1.73_m2} — ABNORMAL LOW (ref >=60)
Globulin: 3.5 g/dL (calc) (ref 1.9–3.7)
Glucose, Bld: 134 mg/dL — ABNORMAL HIGH (ref 65–99)
Potassium: 4.7 mmol/L (ref 3.5–5.3)
Sodium: 140 mmol/L (ref 135–146)
Total Bilirubin: 0.4 mg/dL (ref 0.2–1.2)
Total Protein: 7.9 g/dL (ref 6.1–8.1)

## 2019-12-08 ENCOUNTER — Encounter: Payer: Self-pay | Admitting: Gastroenterology

## 2019-12-16 ENCOUNTER — Other Ambulatory Visit: Payer: Self-pay | Admitting: Family Medicine

## 2019-12-16 DIAGNOSIS — E1169 Type 2 diabetes mellitus with other specified complication: Secondary | ICD-10-CM

## 2019-12-16 DIAGNOSIS — E669 Obesity, unspecified: Secondary | ICD-10-CM

## 2020-01-13 ENCOUNTER — Other Ambulatory Visit: Payer: Self-pay | Admitting: Family Medicine

## 2020-01-17 ENCOUNTER — Ambulatory Visit: Payer: Medicare Other | Admitting: Family Medicine

## 2020-02-23 ENCOUNTER — Other Ambulatory Visit: Payer: Self-pay | Admitting: Family Medicine

## 2020-02-23 DIAGNOSIS — E785 Hyperlipidemia, unspecified: Secondary | ICD-10-CM

## 2020-03-01 ENCOUNTER — Other Ambulatory Visit: Payer: Self-pay | Admitting: *Deleted

## 2020-03-01 DIAGNOSIS — E1121 Type 2 diabetes mellitus with diabetic nephropathy: Secondary | ICD-10-CM

## 2020-03-01 DIAGNOSIS — IMO0002 Reserved for concepts with insufficient information to code with codable children: Secondary | ICD-10-CM

## 2020-03-01 DIAGNOSIS — E1169 Type 2 diabetes mellitus with other specified complication: Secondary | ICD-10-CM

## 2020-03-01 MED ORDER — GLIPIZIDE 10 MG PO TABS: ORAL_TABLET | ORAL | 1 refills | Status: DC

## 2020-03-01 MED ORDER — ALLOPURINOL 300 MG PO TABS: ORAL_TABLET | ORAL | 6 refills | Status: DC

## 2020-03-01 MED ORDER — LINAGLIPTIN 5 MG PO TABS: ORAL_TABLET | ORAL | 3 refills | Status: DC

## 2020-03-01 MED ORDER — HYDROCHLOROTHIAZIDE 12.5 MG PO CAPS: ORAL_CAPSULE | ORAL | 3 refills | Status: DC

## 2020-03-01 MED ORDER — METFORMIN HCL 500 MG PO TABS: 500.0000 mg | ORAL_TABLET | Freq: Two times a day (BID) | ORAL | 3 refills | Status: DC

## 2020-03-01 MED ORDER — POTASSIUM CHLORIDE CRYS ER 10 MEQ PO TBCR: EXTENDED_RELEASE_TABLET | ORAL | 3 refills | Status: DC

## 2020-03-02 ENCOUNTER — Other Ambulatory Visit: Payer: Self-pay | Admitting: Family Medicine

## 2020-03-02 DIAGNOSIS — E1121 Type 2 diabetes mellitus with diabetic nephropathy: Secondary | ICD-10-CM

## 2020-03-02 DIAGNOSIS — IMO0002 Reserved for concepts with insufficient information to code with codable children: Secondary | ICD-10-CM

## 2020-03-15 ENCOUNTER — Encounter: Payer: Self-pay | Admitting: *Deleted

## 2020-03-19 ENCOUNTER — Ambulatory Visit (INDEPENDENT_AMBULATORY_CARE_PROVIDER_SITE_OTHER): Payer: Medicare Other | Admitting: Orthopedic Surgery

## 2020-03-19 ENCOUNTER — Other Ambulatory Visit: Payer: Self-pay

## 2020-03-19 ENCOUNTER — Ambulatory Visit: Payer: Medicare Other

## 2020-03-19 VITALS — Temp 97.0°F | Ht 60.0 in | Wt 188.0 lb

## 2020-03-19 DIAGNOSIS — M1711 Unilateral primary osteoarthritis, right knee: Secondary | ICD-10-CM

## 2020-03-19 DIAGNOSIS — M1710 Unilateral primary osteoarthritis, unspecified knee: Secondary | ICD-10-CM

## 2020-03-19 DIAGNOSIS — Z96651 Presence of right artificial knee joint: Secondary | ICD-10-CM

## 2020-03-19 NOTE — Progress Notes (Signed)
Chief Complaint  Patient presents with  . Follow-up    Recheck on right knee, DOS 03-18-17.    54 year old status post total knee on the right 3 years ago comes in with good function of the knee occasional swelling.  Exam shows 0-1 10 flexion stable in all planes  X-ray looks great left knee does show some arthritis  Recommend 1 year follow-up use ice for swelling

## 2020-03-29 DIAGNOSIS — E119 Type 2 diabetes mellitus without complications: Secondary | ICD-10-CM | POA: Diagnosis not present

## 2020-04-06 LAB — HM DIABETES EYE EXAM

## 2020-05-08 ENCOUNTER — Other Ambulatory Visit: Payer: Self-pay | Admitting: Family Medicine

## 2020-05-17 ENCOUNTER — Ambulatory Visit (INDEPENDENT_AMBULATORY_CARE_PROVIDER_SITE_OTHER): Payer: Medicare Other | Admitting: Family Medicine

## 2020-05-17 ENCOUNTER — Other Ambulatory Visit: Payer: Self-pay

## 2020-05-17 ENCOUNTER — Encounter: Payer: Self-pay | Admitting: Family Medicine

## 2020-05-17 VITALS — BP 116/77 | HR 74 | Resp 16 | Ht 60.0 in | Wt 190.0 lb

## 2020-05-17 DIAGNOSIS — E1159 Type 2 diabetes mellitus with other circulatory complications: Secondary | ICD-10-CM | POA: Diagnosis not present

## 2020-05-17 DIAGNOSIS — M25572 Pain in left ankle and joints of left foot: Secondary | ICD-10-CM

## 2020-05-17 DIAGNOSIS — I10 Essential (primary) hypertension: Secondary | ICD-10-CM

## 2020-05-17 DIAGNOSIS — Z1231 Encounter for screening mammogram for malignant neoplasm of breast: Secondary | ICD-10-CM | POA: Diagnosis not present

## 2020-05-17 DIAGNOSIS — E785 Hyperlipidemia, unspecified: Secondary | ICD-10-CM

## 2020-05-17 DIAGNOSIS — E790 Hyperuricemia without signs of inflammatory arthritis and tophaceous disease: Secondary | ICD-10-CM

## 2020-05-17 DIAGNOSIS — E559 Vitamin D deficiency, unspecified: Secondary | ICD-10-CM

## 2020-05-17 LAB — POCT GLYCOSYLATED HEMOGLOBIN (HGB A1C)
HbA1c POC (<> result, manual entry): 6.9 % (ref 4.0–5.6)
HbA1c, POC (controlled diabetic range): 6.9 % (ref 0.0–7.0)
HbA1c, POC (prediabetic range): 6.9 % — AB (ref 5.7–6.4)
Hemoglobin A1C: 6.9 % — AB (ref 4.0–5.6)

## 2020-05-17 NOTE — Patient Instructions (Addendum)
Annual physical with pap in office in 3to 3.5 months, call if you need me sooner  Blood sugar is , excellent , keep it up!   Mammogram to be scheduled at checkout.    Fasting lipid, cmp and EGFR, CBC, TSH and vit D  , uric acid 3  to 5 days before next visit  Use extra strength Tylenol 1 tablet 3 times daily for the next 5 days and cool compresses to the left ankle.  If you continue to have pain please call and I will refer you to podiatry.  It is important that you exercise regularly at least 30 minutes 5 times a week. If you develop chest pain, have severe difficulty breathing, or feel very tired, stop exercising immediately and seek medical attention  Think about what you will eat, plan ahead. Choose " clean, green, fresh or frozen" over canned, processed or packaged foods which are more sugary, salty and fatty. 70 to 75% of food eaten should be vegetables and fruit. Three meals at set times with snacks allowed between meals, but they must be fruit or vegetables. Aim to eat over a 12 hour period , example 7 am to 7 pm, and STOP after  your last meal of the day. Drink water,generally about 64 ounces per day, no other drink is as healthy. Fruit juice is best enjoyed in a healthy way, by EATING the fruit. Thanks for choosing [facility], we consider it a privelige to serve you.

## 2020-05-18 ENCOUNTER — Encounter: Payer: Self-pay | Admitting: Family Medicine

## 2020-05-18 DIAGNOSIS — M25572 Pain in left ankle and joints of left foot: Secondary | ICD-10-CM | POA: Insufficient documentation

## 2020-05-18 NOTE — Assessment & Plan Note (Signed)
Hyperlipidemia:Low fat diet discussed and encouraged.   Lipid Panel  Lab Results  Component Value Date   CHOL 131 12/06/2019   HDL 45 (L) 12/06/2019   LDLCALC 72 12/06/2019   TRIG 62 12/06/2019   CHOLHDL 2.9 12/06/2019  needs to increase exercise to improve HDL

## 2020-05-18 NOTE — Assessment & Plan Note (Signed)
Not associated with any apparent trauma. Tylenol and ice x 3 days, if persists refer Podiatry

## 2020-05-18 NOTE — Assessment & Plan Note (Signed)
Markedly improved and now controlled , she is applauded o this Ms. Landgren is reminded of the importance of commitment to daily physical activity for 30 minutes or more, as able and the need to limit carbohydrate intake to 30 to 60 grams per meal to help with blood sugar control.   The need to take medication as prescribed, test blood sugar as directed, and to call between visits if there is a concern that blood sugar is uncontrolled is also discussed.   Ms. Dubow is reminded of the importance of daily foot exam, annual eye examination, and good blood sugar, blood pressure and cholesterol control.  Diabetic Labs Latest Ref Rng & Units 05/17/2020 05/17/2020 05/17/2020 05/17/2020 12/06/2019  HbA1c 4.0 - 5.6 % 6.9(A) 6.9 6.9 6.9(A) -  Microalbumin mg/dL - - - - -  Micro/Creat Ratio <30 mcg/mg creat - - - - -  Chol <200 mg/dL - - - - 131  HDL > OR = 50 mg/dL - - - - 45(L)  Calc LDL mg/dL (calc) - - - - 72  Triglycerides <150 mg/dL - - - - 62  Creatinine 0.50 - 1.05 mg/dL - - - - 1.30(H)   BP/Weight 05/17/2020 03/19/2020 11/08/2019 07/25/2019 06/01/2019 04/14/2019 5/82/5189  Systolic BP 842 - 103 128 - 118 867  Diastolic BP 77 - 84 82 - 75 75  Wt. (Lbs) 190 188 179.2 194 195 195 193  BMI 37.11 36.72 35 37.89 38.08 38.08 37.69   Foot/eye exam completion dates Latest Ref Rng & Units 04/06/2020 07/25/2019  Eye Exam No Retinopathy No Retinopathy -  Foot Form Completion - - Done

## 2020-05-18 NOTE — Progress Notes (Signed)
SUTTON PLAKE     MRN: [id number]      DOB: [birth year]   HPI Kelli Spencer is here for follow up and re-evaluation of chronic medical conditions, medication management and review of any available recent lab and radiology data.  Preventive health is updated, specifically  Cancer screening and Immunization.   Questions or concerns regarding consultations or procedures which the PT has had in the interim are  addressed. The PT denies any adverse reactions to current medications since the last visit.  Denies polyuria, polydipsia, blurred vision , or hypoglycemic episodes. Tests twice daily, has log showing excellent control with fasting sugars generally under 120, and bedtime under 150 3 day h/o acute pain of left ankle and foot , nmo specific inciting trauma   ROS Denies recent fever or chills. Denies sinus pressure, nasal congestion, ear pain or sore throat. Denies chest congestion, productive cough or wheezing. Denies chest pains, palpitations and leg swelling Denies abdominal pain, nausea, vomiting,diarrhea or constipation.   Denies dysuria, frequency, hesitancy or incontinence. . Denies headaches, seizures, numbness, or tingling. Denies uncontrolled depression, anxiety or insomnia.Getting along better with her daughter who is unwell and needs to live with her currently Denies skin break down or rash.   PE  BP 116/77   Pulse 74   Resp 16   Ht 5' (1.524 m)   Wt 190 lb (86.2 kg)   LMP 06/10/1988 (Approximate)   SpO2 94%   BMI 37.11 kg/m   Patient alert and oriented and in no cardiopulmonary distress.  HEENT: No facial asymmetry, EOMI,     Neck supple .  Chest: Clear to auscultation bilaterally.  CVS: S1, S2 no murmurs, no S3.Regular rate.  ABD: Soft non tender.   Ext: No edema  MS: Adequate ROM spine, shoulders, hips and knees.Reduced rOM left ankle with tenderness anteriorly  Skin: Intact, no ulcerations or rash noted.  Psych: Good eye contact, normal affect. Memory  intact not anxious or depressed appearing.  CNS: CN 2-12 intact, power,  normal throughout.no focal deficits noted.   Assessment & Plan  Type 2 diabetes mellitus with vascular disease ([place]) Markedly improved and now controlled , she is applauded o this Kelli Spencer is reminded of the importance of commitment to daily physical activity for 30 minutes or more, as able and the need to limit carbohydrate intake to 30 to 60 grams per meal to help with blood sugar control.   The need to take medication as prescribed, test blood sugar as directed, and to call between visits if there is a concern that blood sugar is uncontrolled is also discussed.   Kelli Spencer is reminded of the importance of daily foot exam, annual eye examination, and good blood sugar, blood pressure and cholesterol control.  Diabetic Labs Latest Ref Rng & Units 05/17/2020 05/17/2020 05/17/2020 05/17/2020 12/06/2019  HbA1c 4.0 - 5.6 % 6.9(A) 6.9 6.9 6.9(A) -  Microalbumin mg/dL - - - - -  Micro/Creat Ratio <30 mcg/mg creat - - - - -  Chol <200 mg/dL - - - - 131  HDL > OR = 50 mg/dL - - - - 45(L)  Calc LDL mg/dL (calc) - - - - 72  Triglycerides <150 mg/dL - - - - 62  Creatinine 0.50 - 1.05 mg/dL - - - - 1.30(H)   BP/Weight 05/17/2020 03/19/2020 11/08/2019 07/25/2019 06/01/2019 04/14/2019 7/37/1062  Systolic BP 694 - 854 627 - 035 009  Diastolic BP 77 - 84 82 - 75 75  Wt. (Lbs) 190 188 179.2 194 195 195 193  BMI 37.11 36.72 35 37.89 38.08 38.08 37.69   Foot/eye exam completion dates Latest Ref Rng & Units 04/06/2020 07/25/2019  Eye Exam No Retinopathy No Retinopathy -  Foot Form Completion - - Done        Dyslipidemia, goal LDL below 100 Hyperlipidemia:Low fat diet discussed and encouraged.   Lipid Panel  Lab Results  Component Value Date   CHOL 131 12/06/2019   HDL 45 (L) 12/06/2019   LDLCALC 72 12/06/2019   TRIG 62 12/06/2019   CHOLHDL 2.9 12/06/2019  needs to increase exercise to improve HDL     Essential  hypertension Controlled, no change in medication DASH diet and commitment to daily physical activity for a minimum of 30 minutes discussed and encouraged, as a part of hypertension management. The importance of attaining a healthy weight is also discussed.  BP/Weight 05/17/2020 03/19/2020 11/08/2019 07/25/2019 06/01/2019 04/14/2019 8/41/3244  Systolic BP 010 - 272 536 - 644 034  Diastolic BP 77 - 84 82 - 75 75  Wt. (Lbs) 190 188 179.2 194 195 195 193  BMI 37.11 36.72 35 37.89 38.08 38.08 37.69       Acute left ankle pain Not associated with any apparent trauma. Tylenol and ice x 3 days, if persists refer Podiatry  Morbid obesity ([place]) Obesity associated with ypertension, arthritis and diabetes  Patient re-educated about  the importance of commitment to a  minimum of 150 minutes of exercise per week as able.  The importance of healthy food choices with portion control discussed, as well as eating regularly and within a 12 hour window most days. The need to choose "clean , green" food 50 to 75% of the time is discussed, as well as to make water the primary drink and set a goal of 64 ounces water daily.    Weight /BMI 05/17/2020 03/19/2020 11/08/2019  WEIGHT 190 lb 188 lb 179 lb 3.2 oz  HEIGHT 5\' 0"  5\' 0"  5\' 0"   BMI 37.11 kg/m2 36.72 kg/m2 35 kg/m2

## 2020-05-18 NOTE — Assessment & Plan Note (Signed)
Controlled, no change in medication DASH diet and commitment to daily physical activity for a minimum of 30 minutes discussed and encouraged, as a part of hypertension management. The importance of attaining a healthy weight is also discussed.  BP/Weight 05/17/2020 03/19/2020 11/08/2019 07/25/2019 06/01/2019 04/14/2019 5/48/6282  Systolic BP 417 - 530 104 - 045 913  Diastolic BP 77 - 84 82 - 75 75  Wt. (Lbs) 190 188 179.2 194 195 195 193  BMI 37.11 36.72 35 37.89 38.08 38.08 37.69

## 2020-05-18 NOTE — Assessment & Plan Note (Signed)
Obesity associated with ypertension, arthritis and diabetes  Patient re-educated about  the importance of commitment to a  minimum of 150 minutes of exercise per week as able.  The importance of healthy food choices with portion control discussed, as well as eating regularly and within a 12 hour window most days. The need to choose "clean , green" food 50 to 75% of the time is discussed, as well as to make water the primary drink and set a goal of 64 ounces water daily.    Weight /BMI 05/17/2020 03/19/2020 11/08/2019  WEIGHT 190 lb 188 lb 179 lb 3.2 oz  HEIGHT 5\' 0"  5\' 0"  5\' 0"   BMI 37.11 kg/m2 36.72 kg/m2 35 kg/m2

## 2020-05-19 ENCOUNTER — Other Ambulatory Visit: Payer: Self-pay | Admitting: Gastroenterology

## 2020-05-25 ENCOUNTER — Other Ambulatory Visit: Payer: Self-pay

## 2020-05-25 ENCOUNTER — Ambulatory Visit
Admission: RE | Admit: 2020-05-25 | Discharge: 2020-05-25 | Disposition: A | Discharge status: Home / Self Care | Payer: Medicare Other | Source: Ambulatory Visit | Attending: Family Medicine | Admitting: Family Medicine

## 2020-05-25 DIAGNOSIS — Z1231 Encounter for screening mammogram for malignant neoplasm of breast: Secondary | ICD-10-CM | POA: Diagnosis not present

## 2020-05-28 ENCOUNTER — Ambulatory Visit: Payer: Medicare Other

## 2020-05-28 ENCOUNTER — Other Ambulatory Visit: Payer: Self-pay

## 2020-05-31 ENCOUNTER — Ambulatory Visit: Payer: Medicare Other | Admitting: Family Medicine

## 2020-06-05 ENCOUNTER — Other Ambulatory Visit: Payer: Self-pay

## 2020-06-05 DIAGNOSIS — M545 Low back pain, unspecified: Secondary | ICD-10-CM

## 2020-06-05 DIAGNOSIS — E1121 Type 2 diabetes mellitus with diabetic nephropathy: Secondary | ICD-10-CM

## 2020-06-05 DIAGNOSIS — IMO0002 Reserved for concepts with insufficient information to code with codable children: Secondary | ICD-10-CM

## 2020-06-05 MED ORDER — METFORMIN HCL 500 MG PO TABS: 500.0000 mg | ORAL_TABLET | Freq: Two times a day (BID) | ORAL | 1 refills | Status: DC

## 2020-06-05 MED ORDER — GLIPIZIDE 10 MG PO TABS: ORAL_TABLET | ORAL | 1 refills | Status: DC

## 2020-06-05 MED ORDER — HYDROXYZINE HCL 50 MG PO TABS: ORAL_TABLET | ORAL | 1 refills | Status: DC

## 2020-06-05 MED ORDER — HYDROCHLOROTHIAZIDE 12.5 MG PO CAPS: ORAL_CAPSULE | ORAL | 1 refills | Status: DC

## 2020-06-05 MED ORDER — ALLOPURINOL 300 MG PO TABS: ORAL_TABLET | ORAL | 1 refills | Status: DC

## 2020-06-05 MED ORDER — TIZANIDINE HCL 4 MG PO CAPS
4.0000 mg | ORAL_CAPSULE | Freq: Every evening | ORAL | 0 refills | Status: DC | PRN
Start: 2020-06-05 — End: 2023-05-15

## 2020-06-05 MED ORDER — PANTOPRAZOLE SODIUM 40 MG PO TBEC: DELAYED_RELEASE_TABLET | ORAL | 1 refills | Status: DC

## 2020-07-03 ENCOUNTER — Other Ambulatory Visit: Payer: Self-pay | Admitting: Family Medicine

## 2020-07-03 DIAGNOSIS — IMO0002 Reserved for concepts with insufficient information to code with codable children: Secondary | ICD-10-CM

## 2020-08-22 ENCOUNTER — Other Ambulatory Visit: Payer: Self-pay | Admitting: Family Medicine

## 2020-08-22 DIAGNOSIS — E785 Hyperlipidemia, unspecified: Secondary | ICD-10-CM

## 2020-09-05 DIAGNOSIS — I10 Essential (primary) hypertension: Secondary | ICD-10-CM | POA: Diagnosis not present

## 2020-09-05 DIAGNOSIS — E790 Hyperuricemia without signs of inflammatory arthritis and tophaceous disease: Secondary | ICD-10-CM | POA: Diagnosis not present

## 2020-09-05 DIAGNOSIS — E559 Vitamin D deficiency, unspecified: Secondary | ICD-10-CM | POA: Diagnosis not present

## 2020-09-05 DIAGNOSIS — E785 Hyperlipidemia, unspecified: Secondary | ICD-10-CM | POA: Diagnosis not present

## 2020-09-06 ENCOUNTER — Encounter: Payer: Medicare Other | Admitting: Family Medicine

## 2020-09-07 LAB — COMPLETE METABOLIC PANEL WITH GFR
AG Ratio: 1.6 (calc) (ref 1.0–2.5)
ALT: 18 U/L (ref 6–29)
AST: 15 U/L (ref 10–35)
Albumin: 4.6 g/dL (ref 3.6–5.1)
Alkaline phosphatase (APISO): 99 U/L (ref 37–153)
BUN/Creatinine Ratio: 21 (calc) (ref 6–22)
BUN: 23 mg/dL (ref 7–25)
CO2: 30 mmol/L (ref 20–32)
Calcium: 10.3 mg/dL (ref 8.6–10.4)
Chloride: 98 mmol/L (ref 98–110)
Creat: 1.11 mg/dL — ABNORMAL HIGH (ref 0.50–1.05)
GFR, Est African American: 64 mL/min/{1.73_m2} (ref >=60)
GFR, Est Non African American: 55 mL/min/{1.73_m2} — ABNORMAL LOW (ref >=60)
Globulin: 2.9 g/dL (calc) (ref 1.9–3.7)
Glucose, Bld: 97 mg/dL (ref 65–99)
Potassium: 4 mmol/L (ref 3.5–5.3)
Sodium: 140 mmol/L (ref 135–146)
Total Bilirubin: 0.4 mg/dL (ref 0.2–1.2)
Total Protein: 7.5 g/dL (ref 6.1–8.1)

## 2020-09-07 LAB — LIPID PANEL
Cholesterol: 125 mg/dL (ref <200)
HDL: 44 mg/dL — ABNORMAL LOW (ref >=50)
LDL Cholesterol (Calc): 66 mg/dL (calc)
Non-HDL Cholesterol (Calc): 81 mg/dL (calc) (ref <130)
Total CHOL/HDL Ratio: 2.8 (calc) (ref <5.0)
Triglycerides: 72 mg/dL (ref <150)

## 2020-09-07 LAB — CBC
HCT: 37.1 % (ref 35.0–45.0)
Hemoglobin: 12.2 g/dL (ref 11.7–15.5)
MCH: 30 pg (ref 27.0–33.0)
MCHC: 32.9 g/dL (ref 32.0–36.0)
MCV: 91.4 fL (ref 80.0–100.0)
MPV: 11.2 fL (ref 7.5–12.5)
Platelets: 326 10*3/uL (ref 140–400)
RBC: 4.06 10*6/uL (ref 3.80–5.10)
RDW: 12.4 % (ref 11.0–15.0)
WBC: 7.2 10*3/uL (ref 3.8–10.8)

## 2020-09-07 LAB — TSH: TSH: 3.72 mIU/L (ref 0.40–4.50)

## 2020-09-07 LAB — TEST AUTHORIZATION

## 2020-09-07 LAB — HEMOGLOBIN A1C W/OUT EAG: Hgb A1c MFr Bld: 7.3 % of total Hgb — ABNORMAL HIGH (ref <5.7)

## 2020-09-07 LAB — URIC ACID: Uric Acid, Serum: 3.5 mg/dL (ref 2.5–7.0)

## 2020-09-07 LAB — VITAMIN D 25 HYDROXY (VIT D DEFICIENCY, FRACTURES): Vit D, 25-Hydroxy: 30 ng/mL (ref 30–100)

## 2020-09-11 ENCOUNTER — Other Ambulatory Visit: Payer: Self-pay

## 2020-09-11 ENCOUNTER — Ambulatory Visit (INDEPENDENT_AMBULATORY_CARE_PROVIDER_SITE_OTHER): Payer: Medicare Other | Admitting: Family Medicine

## 2020-09-11 ENCOUNTER — Encounter: Payer: Self-pay | Admitting: Family Medicine

## 2020-09-11 VITALS — BP 135/82 | HR 74 | Resp 16 | Ht 60.0 in | Wt 190.0 lb

## 2020-09-11 DIAGNOSIS — Z2821 Immunization not carried out because of patient refusal: Secondary | ICD-10-CM

## 2020-09-11 DIAGNOSIS — E1159 Type 2 diabetes mellitus with other circulatory complications: Secondary | ICD-10-CM

## 2020-09-11 DIAGNOSIS — Z0000 Encounter for general adult medical examination without abnormal findings: Secondary | ICD-10-CM

## 2020-09-11 DIAGNOSIS — Z0001 Encounter for general adult medical examination with abnormal findings: Secondary | ICD-10-CM | POA: Insufficient documentation

## 2020-09-11 NOTE — Patient Instructions (Addendum)
F/u in office with MD in 4 months, glyco HB at visit   Start regular  Exercise for 30 mins 5 days per week  Labs and BP excellent and exam is  Good except for your weight    Change snacks and increase vegetables and fruits, and drink only water   Please reconsider and work on vaccines  Think about what you will eat, plan ahead. Choose " clean, green, fresh or frozen" over canned, processed or packaged foods which are more sugary, salty and fatty. 70 to 75% of food eaten should be vegetables and fruit. Three meals at set times with snacks allowed between meals, but they must be fruit or vegetables. Aim to eat over a 12 hour period , example 7 am to 7 pm, and STOP after  your last meal of the day. Drink water,generally about 64 ounces per day, no other drink is as healthy. Fruit juice is best enjoyed in a healthy way, by EATING the fruit. Thanks for choosing [facility], we consider it a privelige to serve you.

## 2020-09-11 NOTE — Progress Notes (Signed)
Kelli Spencer     MRN: [id number]      DOB: 1966/05/08  HPI: Patient is in for annual physical exam. No other health concerns are expressed or addressed at the visit. Recent labs, if available are reviewed. Immunization is reviewed , and  She continues to refuse immnumization   PE: BP 135/82   Pulse 74   Resp 16   Ht 5' (1.524 m)   Wt 190 lb (86.2 kg)   LMP 06/10/1988 (Approximate)   SpO2 99%   BMI 37.11 kg/m   Pleasant  female, alert and oriented x 3, in no cardio-pulmonary distress. Afebrile. HEENT No facial trauma or asymetry. Sinuses non tender.  Extra occullar muscles intact.. External ears normal, . Neck: supple, no adenopathy,JVD or thyromegaly.No bruits.  Chest: Clear to ascultation bilaterally.No crackles or wheezes. Non tender to palpation  Breast: Not examined, normal mammogram 05/2020. Asymptomatic   Cardiovascular system; Heart sounds normal,  S1 and  S2 ,no S3.  No murmur, or thrill. Apical beat not displaced Peripheral pulses normal.  Abdomen: Soft, non tender, no organomegaly or masses. No bruits. Bowel sounds normal. No guarding, tenderness or rebound.   .   Musculoskeletal exam: Full ROM of spine, hips , shoulders and knees. Mild deformity ,swelling and  crepitus noted.in knees No muscle wasting or atrophy.   Neurologic: Cranial nerves 2 to 12 intact. Power, tone ,sensation and reflexes normal throughout. No disturbance in gait. No tremor.  Skin: Intact, no ulceration, erythema , scaling or rash noted. Pigmentation normal throughout  Psych; Normal mood and affect. Judgement and concentration normal   Assessment & Plan:  Encounter for annual general medical examination with abnormal findings in adult Annual exam as documented. Counseling done  re healthy lifestyle involving commitment to 150 minutes exercise per week, heart healthy diet, and attaining healthy weight.The importance of adequate sleep also discussed. Regular  seat belt use and home safety, is also discussed. Changes in health habits are decided on by the patient with goals and time frames  set for achieving them. Immunization and cancer screening needs are specifically addressed at this visit.   Morbid obesity ([place])  Patient re-educated about  the importance of commitment to a  minimum of 150 minutes of exercise per week as able.  The importance of healthy food choices with portion control discussed, as well as eating regularly and within a 12 hour window most days. The need to choose "clean , green" food 50 to 75% of the time is discussed, as well as to make water the primary drink and set a goal of 64 ounces water daily.    Weight /BMI 09/11/2020 05/17/2020 03/19/2020  WEIGHT 190 lb 190 lb 188 lb  HEIGHT 5\' 0"  5\' 0"  5\' 0"   BMI 37.11 kg/m2 37.11 kg/m2 36.72 kg/m2      Immunization refused Discussed importance of vaccination, refuses all vaccines at this time  Type 2 diabetes mellitus with vascular disease ([place]) Controlled, no change in medication Kelli Spencer is reminded of the importance of commitment to daily physical activity for 30 minutes or more, as able and the need to limit carbohydrate intake to 30 to 60 grams per meal to help with blood sugar control.   The need to take medication as prescribed, test blood sugar as directed, and to call between visits if there is a concern that blood sugar is uncontrolled is also discussed.   Kelli Spencer is reminded of the importance of daily foot exam, annual  eye examination, and good blood sugar, blood pressure and cholesterol control.  Diabetic Labs Latest Ref Rng & Units 09/05/2020 05/17/2020 05/17/2020 05/17/2020 05/17/2020  HbA1c <5.7 % of total Hgb 7.3(H) 6.9(A) 6.9 6.9 6.9(A)  Microalbumin mg/dL - - - - -  Micro/Creat Ratio <30 mcg/mg creat - - - - -  Chol <200 mg/dL 125 - - - -  HDL > OR = 50 mg/dL 44(L) - - - -  Calc LDL mg/dL (calc) 66 - - - -  Triglycerides <150 mg/dL 72 - - - -   Creatinine 0.50 - 1.05 mg/dL 1.11(H) - - - -   BP/Weight 09/11/2020 05/17/2020 03/19/2020 11/08/2019 07/25/2019 06/01/2019 6/50/3546  Systolic BP 568 127 - 517 001 - 749  Diastolic BP 82 77 - 84 82 - 75  Wt. (Lbs) 190 190 188 179.2 194 195 195  BMI 37.11 37.11 36.72 35 37.89 38.08 38.08   Foot/eye exam completion dates Latest Ref Rng & Units 09/11/2020 04/06/2020  Eye Exam No Retinopathy - No Retinopathy  Foot Form Completion - Done -

## 2020-09-12 ENCOUNTER — Telehealth: Payer: Self-pay | Admitting: Family Medicine

## 2020-09-12 ENCOUNTER — Encounter: Payer: Self-pay | Admitting: Family Medicine

## 2020-09-12 DIAGNOSIS — Z1211 Encounter for screening for malignant neoplasm of colon: Secondary | ICD-10-CM

## 2020-09-12 NOTE — Assessment & Plan Note (Signed)

## 2020-09-12 NOTE — Assessment & Plan Note (Signed)
Controlled, no change in medication Ms. Donn is reminded of the importance of commitment to daily physical activity for 30 minutes or more, as able and the need to limit carbohydrate intake to 30 to 60 grams per meal to help with blood sugar control.   The need to take medication as prescribed, test blood sugar as directed, and to call between visits if there is a concern that blood sugar is uncontrolled is also discussed.   Ms. Bowne is reminded of the importance of daily foot exam, annual eye examination, and good blood sugar, blood pressure and cholesterol control.  Diabetic Labs Latest Ref Rng & Units 09/05/2020 05/17/2020 05/17/2020 05/17/2020 05/17/2020  HbA1c <5.7 % of total Hgb 7.3(H) 6.9(A) 6.9 6.9 6.9(A)  Microalbumin mg/dL - - - - -  Micro/Creat Ratio <30 mcg/mg creat - - - - -  Chol <200 mg/dL 125 - - - -  HDL > OR = 50 mg/dL 44(L) - - - -  Calc LDL mg/dL (calc) 66 - - - -  Triglycerides <150 mg/dL 72 - - - -  Creatinine 0.50 - 1.05 mg/dL 1.11(H) - - - -   BP/Weight 09/11/2020 05/17/2020 03/19/2020 11/08/2019 07/25/2019 06/01/2019 5/90/9311  Systolic BP 216 244 - 695 072 - 257  Diastolic BP 82 77 - 84 82 - 75  Wt. (Lbs) 190 190 188 179.2 194 195 195  BMI 37.11 37.11 36.72 35 37.89 38.08 38.08   Foot/eye exam completion dates Latest Ref Rng & Units 09/11/2020 04/06/2020  Eye Exam No Retinopathy - No Retinopathy  Foot Form Completion - Done -

## 2020-09-12 NOTE — Assessment & Plan Note (Signed)
Discussed importance of vaccination, refuses all vaccines at this time

## 2020-09-12 NOTE — Telephone Encounter (Signed)
Please call and arrange for patient to submit cologuard test as colon screen is overdue Also please advise her we will do pap at next visit, was overlooked at visit yesterday and add on to her f/u appt please, thank you!

## 2020-09-12 NOTE — Assessment & Plan Note (Signed)
  Patient re-educated about  the importance of commitment to a  minimum of 150 minutes of exercise per week as able.  The importance of healthy food choices with portion control discussed, as well as eating regularly and within a 12 hour window most days. The need to choose "clean , green" food 50 to 75% of the time is discussed, as well as to make water the primary drink and set a goal of 64 ounces water daily.    Weight /BMI 09/11/2020 05/17/2020 03/19/2020  WEIGHT 190 lb 190 lb 188 lb  HEIGHT 5\' 0"  5\' 0"  5\' 0"   BMI 37.11 kg/m2 37.11 kg/m2 36.72 kg/m2

## 2020-09-20 ENCOUNTER — Encounter: Payer: Self-pay | Admitting: Nurse Practitioner

## 2020-09-20 ENCOUNTER — Telehealth (INDEPENDENT_AMBULATORY_CARE_PROVIDER_SITE_OTHER): Payer: Medicare Other | Admitting: Nurse Practitioner

## 2020-09-20 ENCOUNTER — Encounter: Payer: Medicare Other | Admitting: Family Medicine

## 2020-09-20 ENCOUNTER — Other Ambulatory Visit: Payer: Self-pay

## 2020-09-20 DIAGNOSIS — Z2821 Immunization not carried out because of patient refusal: Secondary | ICD-10-CM | POA: Diagnosis not present

## 2020-09-20 DIAGNOSIS — Z0000 Encounter for general adult medical examination without abnormal findings: Secondary | ICD-10-CM | POA: Diagnosis not present

## 2020-09-20 NOTE — Telephone Encounter (Signed)
Pt never received the kit. Resent

## 2020-09-20 NOTE — Patient Instructions (Addendum)
[name] , Thank you for taking time to come for your Medicare Wellness Visit. I appreciate your ongoing commitment to your health goals. Please review the following plan we discussed and let me know if I can assist you in the future.   Screening recommendations/referrals: Colonoscopy: Completed; DUE 06/23/26  Mammogram: Completed;  Bone Density: Completed  Recommended yearly ophthalmology/optometry visit for glaucoma screening and checkup Recommended yearly dental visit for hygiene and checkup  Vaccinations: Influenza vaccine: DUE; pt declines at this time  Pneumococcal vaccine: Complete  Tdap vaccine: Complete; DUE 09/12/21   Shingles vaccine: Education provided.     Advanced directives: N/A   Conditions/risks identified: Pt missing a few vaccines and declines those at this time.   Next appointment: 01/09/21 @ 8am with Dr. Moshe Cipro   Preventive Care 40-64 Years, Female Preventive care refers to lifestyle choices and visits with your health care provider that can promote health and wellness. What does preventive care include?  A yearly physical exam. This is also called an annual well check.  Dental exams once or twice a year.  Routine eye exams. Ask your health care provider how often you should have your eyes checked.  Personal lifestyle choices, including:  Daily care of your teeth and gums.  Regular physical activity.  Eating a healthy diet.  Avoiding tobacco and drug use.  Limiting alcohol use.  Practicing safe sex.  Taking low-dose aspirin daily starting at age 54.  Taking vitamin and mineral supplements as recommended by your health care provider. What happens during an annual well check? The services and screenings done by your health care provider during your annual well check will depend on your age, overall health, lifestyle risk factors, and family history of disease. Counseling  Your health care provider may ask you questions about your:  Alcohol  use.  Tobacco use.  Drug use.  Emotional well-being.  Home and relationship well-being.  Sexual activity.  Eating habits.  Work and work Statistician.  Method of birth control.  Menstrual cycle.  Pregnancy history. Screening  You may have the following tests or measurements:  Height, weight, and BMI.  Blood pressure.  Lipid and cholesterol levels. These may be checked every 5 years, or more frequently if you are over 19 years old.  Skin check.  Lung cancer screening. You may have this screening every year starting at age 30 if you have a 30-pack-year history of smoking and currently smoke or have quit within the past 15 years.  Fecal occult blood test (FOBT) of the stool. You may have this test every year starting at age 40.  Flexible sigmoidoscopy or colonoscopy. You may have a sigmoidoscopy every 5 years or a colonoscopy every 10 years starting at age 28.  Hepatitis C blood test.  Hepatitis B blood test.  Sexually transmitted disease (STD) testing.  Diabetes screening. This is done by checking your blood sugar (glucose) after you have not eaten for a while (fasting). You may have this done every 1-3 years.  Mammogram. This may be done every 1-2 years. Talk to your health care provider about when you should start having regular mammograms. This may depend on whether you have a family history of breast cancer.  BRCA-related cancer screening. This may be done if you have a family history of breast, ovarian, tubal, or peritoneal cancers.  Pelvic exam and Pap test. This may be done every 3 years starting at age 63. Starting at age 26, this may be done every 5 years  if you have a Pap test in combination with an HPV test.  Bone density scan. This is done to screen for osteoporosis. You may have this scan if you are at high risk for osteoporosis. Discuss your test results, treatment options, and if necessary, the need for more tests with your health care  provider. Vaccines  Your health care provider may recommend certain vaccines, such as:  Influenza vaccine. This is recommended every year.  Tetanus, diphtheria, and acellular pertussis (Tdap, Td) vaccine. You may need a Td booster every 10 years.  Zoster vaccine. You may need this after age 68.  Pneumococcal 13-valent conjugate (PCV13) vaccine. You may need this if you have certain conditions and were not previously vaccinated.  Pneumococcal polysaccharide (PPSV23) vaccine. You may need one or two doses if you smoke cigarettes or if you have certain conditions. Talk to your health care provider about which screenings and vaccines you need and how often you need them. This information is not intended to replace advice given to you by your health care provider. Make sure you discuss any questions you have with your health care provider. Document Released: 11/16/2015 Document Revised: 07/09/2016 Document Reviewed: 08/21/2015 Elsevier Interactive Patient Education  2017 [place] Prevention in the Home Falls can cause injuries. They can happen to people of all ages. There are many things you can do to make your home safe and to help prevent falls. What can I do on the outside of my home?  Regularly fix the edges of walkways and driveways and fix any cracks.  Remove anything that might make you trip as you walk through a door, such as a raised step or threshold.  Trim any bushes or trees on the path to your home.  Use bright outdoor lighting.  Clear any walking paths of anything that might make someone trip, such as rocks or tools.  Regularly check to see if handrails are loose or broken. Make sure that both sides of any steps have handrails.  Any raised decks and porches should have guardrails on the edges.  Have any leaves, snow, or ice cleared regularly.  Use sand or salt on walking paths during winter.  Clean up any spills in your garage right away. This includes  oil or grease spills. What can I do in the bathroom?  Use night lights.  Install grab bars by the toilet and in the tub and shower. Do not use towel bars as grab bars.  Use non-skid mats or decals in the tub or shower.  If you need to sit down in the shower, use a plastic, non-slip stool.  Keep the floor dry. Clean up any water that spills on the floor as soon as it happens.  Remove soap buildup in the tub or shower regularly.  Attach bath mats securely with double-sided non-slip rug tape.  Do not have throw rugs and other things on the floor that can make you trip. What can I do in the bedroom?  Use night lights.  Make sure that you have a light by your bed that is easy to reach.  Do not use any sheets or blankets that are too big for your bed. They should not hang down onto the floor.  Have a firm chair that has side arms. You can use this for support while you get dressed.  Do not have throw rugs and other things on the floor that can make you trip. What can I do in  the kitchen?  Clean up any spills right away.  Avoid walking on wet floors.  Keep items that you use a lot in easy-to-reach places.  If you need to reach something above you, use a strong step stool that has a grab bar.  Keep electrical cords out of the way.  Do not use floor polish or wax that makes floors slippery. If you must use wax, use non-skid floor wax.  Do not have throw rugs and other things on the floor that can make you trip. What can I do with my stairs?  Do not leave any items on the stairs.  Make sure that there are handrails on both sides of the stairs and use them. Fix handrails that are broken or loose. Make sure that handrails are as long as the stairways.  Check any carpeting to make sure that it is firmly attached to the stairs. Fix any carpet that is loose or worn.  Avoid having throw rugs at the top or bottom of the stairs. If you do have throw rugs, attach them to the floor  with carpet tape.  Make sure that you have a light switch at the top of the stairs and the bottom of the stairs. If you do not have them, ask someone to add them for you. What else can I do to help prevent falls?  Wear shoes that:  Do not have high heels.  Have rubber bottoms.  Are comfortable and fit you well.  Are closed at the toe. Do not wear sandals.  If you use a stepladder:  Make sure that it is fully opened. Do not climb a closed stepladder.  Make sure that both sides of the stepladder are locked into place.  Ask someone to hold it for you, if possible.  Clearly mark and make sure that you can see:  Any grab bars or handrails.  First and last steps.  Where the edge of each step is.  Use tools that help you move around (mobility aids) if they are needed. These include:  Canes.  Walkers.  Scooters.  Crutches.  Turn on the lights when you go into a dark area. Replace any light bulbs as soon as they burn out.  Set up your furniture so you have a clear path. Avoid moving your furniture around.  If any of your floors are uneven, fix them.  If there are any pets around you, be aware of where they are.  Review your medicines with your doctor. Some medicines can make you feel dizzy. This can increase your chance of falling. Ask your doctor what other things that you can do to help prevent falls. This information is not intended to replace advice given to you by your health care provider. Make sure you discuss any questions you have with your health care provider. Document Released: 08/16/2009 Document Revised: 03/27/2016 Document Reviewed: 11/24/2014 Elsevier Interactive Patient Education  2017 [employer].

## 2020-09-20 NOTE — Addendum Note (Signed)
Addended by: Eual Fines on: 09/20/2020 11:44 AM   Modules accepted: Orders

## 2020-09-20 NOTE — Progress Notes (Addendum)
Subjective:   Kelli Spencer is a 54 y.o. female who presents for Medicare Annual (Subsequent) preventive examination.        Objective:    There were no vitals filed for this visit. There is no height or weight on file to calculate BMI.  Advanced Directives 12/27/2018 04/02/2018 12/16/2017 04/27/2017 04/24/2017 04/07/2017 03/18/2017  Does Patient Have a Medical Advance Directive? _0  No No  Would patient like information on creating a medical advance directive? Yes (ED - Information included in AVS) Yes (MAU/Ambulatory/Procedural Areas - Information given) Yes (ED - Information included in AVS) - - - No - Patient declined  Pre-existing out of facility DNR order (yellow form or pink MOST form) - - - - - - -    Current Medications (verified) Outpatient Encounter Medications as of 09/20/2020  Medication Sig  . Accu-Chek Softclix Lancets lancets USE TO TEST blood sugar twice daily  Dx E11.65  . allopurinol (ZYLOPRIM) 300 MG tablet TAKE 1 TABLET(300 MG) BY MOUTH DAILY  . blood glucose meter kit and supplies Dispense based on patient and insurance preference. Once daily testing dx e11.9  . cholecalciferol (VITAMIN D) 400 UNITS TABS tablet Take 800 Units by mouth daily.  . clotrimazole-betamethasone (LOTRISONE) cream Apply cream twice daily to rash for 1 week, then as needed  . glipiZIDE (GLUCOTROL) 10 MG tablet TAKE 1 TABLET(10 MG) BY MOUTH TWICE DAILY BEFORE A MEAL  . glucose blood (ACCU-CHEK AVIVA PLUS) test strip USE TO TEST BLOOD SUGAR twice daily DX E11.65  . hydrochlorothiazide (MICROZIDE) 12.5 MG capsule TAKE 1 CAPSULE(12.5 MG) BY MOUTH DAILY  . hydrOXYzine (ATARAX/VISTARIL) 50 MG tablet Take 1 tablet by mouth at bedtime for itching or anxiety  . linagliptin (TRADJENTA) 5 MG TABS tablet TAKE 1 TABLET(5 MG) BY MOUTH DAILY  . lubiprostone (AMITIZA) 24 MCG capsule TAKE 1 CAPSULE(24 MCG) BY MOUTH TWICE DAILY WITH A MEAL  . metFORMIN (GLUCOPHAGE) 500 MG tablet TAKE 1 TABLET(500  MG) BY MOUTH TWICE DAILY WITH A MEAL  . pantoprazole (PROTONIX) 40 MG tablet TAKE 1 TABLET(40 MG) BY MOUTH TWICE DAILY BEFORE A MEAL  . potassium chloride (KLOR-CON) 10 MEQ tablet TAKE 1 TABLET(10 MEQ) BY MOUTH DAILY  . pravastatin (PRAVACHOL) 10 MG tablet TAKE 1 TABLET(10 MG) BY MOUTH DAILY  . tiZANidine (ZANAFLEX) 4 MG capsule Take 1 capsule (4 mg total) by mouth at bedtime as needed for muscle spasms.   No facility-administered encounter medications on file as of 09/20/2020.    Allergies (verified) Prednisone, Amlodipine, Ace inhibitors, and Metformin and related   History: Past Medical History:  Diagnosis Date  . Arthritis   . Asthma   . Diabetes mellitus without complication ([place])   . Diastolic hypertension   . GERD (gastroesophageal reflux disease)   . Hyperlipidemia   . Hypertension   . Migraines   . Obesity   . Seizures ([place])    pt states she has seizures only when she gets upset. pt is taking no seizure meds   Past Surgical History:  Procedure Laterality Date  . ABDOMINAL HYSTERECTOMY  1997   fibroids  . COLONOSCOPY N/A 06/23/2016   Dr. Oneida Alar: prep not adequate to detect polyps less than 53m, redundant left colon. hemorrhoids. next TCS in 5 years.   . ESOPHAGOGASTRODUODENOSCOPY N/A 04/02/2018   Dr. fOneida Alar Benign-appearing esophageal stricture status post dilation, multiple benign gastric polyps, mild gastritis no H. pylori.  .Marland KitchenKNEE ARTHROSCOPY WITH MEDIAL MENISECTOMY Left 02/09/2014  Procedure: KNEE ARTHROSCOPY WITH MEDIAL MENISECTOMY;  Surgeon: Carole Civil, MD;  Location: [facility];  Service: Orthopedics;  Laterality: Left;  . PARTIAL HYSTERECTOMY    . SAVORY DILATION N/A 04/02/2018   Procedure: SAVORY DILATION;  Surgeon: Danie Binder, MD;  Location: [facility];  Service: Endoscopy;  Laterality: N/A;  . TOTAL KNEE ARTHROPLASTY Right 03/18/2017   Procedure: TOTAL KNEE ARTHROPLASTY;  Surgeon: Carole Civil, MD;  Location: [facility];  Service: Orthopedics;   Laterality: Right;   Family History  Problem Relation Age of Onset  . Kidney failure Mother   . Diabetes Mother   . Hypertension Mother   . Stroke Mother   . Diabetes Father   . Heart disease Father   . Hypertension Sister   . Colon cancer Neg Hx    Social History   Socioeconomic History  . Marital status: Divorced    Spouse name: Not on file  . Number of children: 3  . Years of education: Not on file  . Highest education level: 9th grade  Occupational History  . Occupation: disabled  Tobacco Use  . Smoking status: Never Smoker  . Smokeless tobacco: Never Used  Vaping Use  . Vaping Use: Never used  Substance and Sexual Activity  . Alcohol use: No  . Drug use: No  . Sexual activity: Not Currently  Other Topics Concern  . Not on file  Social History Narrative   Daughter currently living with her until she can get her own place.    Social Determinants of Health   Financial Resource Strain:   . Difficulty of Paying Living Expenses: Not on file  Food Insecurity:   . Worried About Charity fundraiser in the Last Year: Not on file  . Ran Out of Food in the Last Year: Not on file  Transportation Needs:   . Lack of Transportation (Medical): Not on file  . Lack of Transportation (Non-Medical): Not on file  Physical Activity:   . Days of Exercise per Week: Not on file  . Minutes of Exercise per Session: Not on file  Stress:   . Feeling of Stress : Not on file  Social Connections:   . Frequency of Communication with Friends and Family: Not on file  . Frequency of Social Gatherings with Friends and Family: Not on file  . Attends Religious Services: Not on file  . Active Member of Clubs or Organizations: Not on file  . Attends Archivist Meetings: Not on file  . Marital Status: Not on file    Tobacco Counseling Counseling given: Not Answered                 Diabetic? Yes          Activities of Daily Living No flowsheet data  found.  Patient Care Team: Fayrene Helper, MD as PCP - General (Family Medicine) Carole Civil, MD as Consulting Physician (Orthopedic Surgery) Arlana Lindau, RD as Dietitian (Nutrition) Fields, Marga Melnick, MD (Inactive) as Consulting Physician (Gastroenterology)  Indicate any recent Medical Services you may have received from other than [facility] providers in the past year (date may be approximate).     Assessment:   This is a routine wellness examination for Kelli Spencer.  Hearing/Vision screen No exam data present  Dietary issues and exercise activities discussed:    Goals    . DIET - EAT MORE FRUITS AND VEGETABLES     Follow the low purine diet     .  Increase physical activity     Try to walk 30 minutes after dinner 2 evenings a week       Depression Screen PHQ 2/9 Scores 09/11/2020 06/01/2019 04/14/2019 12/27/2018 10/11/2018 09/14/2018 06/01/2018  PHQ - 2 Score 0 0 0 0 0 1 0  PHQ- 9 Score - - - - 0 4 -    Fall Risk Fall Risk  09/11/2020 05/17/2020 11/08/2019 06/01/2019 04/14/2019  Falls in the past year? 0 0 0 0 0  Number falls in past yr: - 0 0 - 0  Injury with Fall? - 0 0 0 0    Any stairs in or around the home? Yes  If so, are there any without handrails? Yes  Home free of loose throw rugs in walkways, pet beds, electrical cords, etc? Yes  Adequate lighting in your home to reduce risk of falls? Yes   ASSISTIVE DEVICES UTILIZED TO PREVENT FALLS:  Life alert? No  Use of a cane, walker or w/c? No  Grab bars in the bathroom? No  Shower chair or bench in shower? No  Elevated toilet seat or a handicapped toilet? No   TIMED UP AND GO:  Was the test performed? No .    Cognitive Function:     6CIT Screen 12/27/2018  What Year? 0 points  What month? 0 points  What time? 0 points  Count back from 20 0 points  Months in reverse 0 points  Repeat phrase 0 points  Total Score 0    Immunizations  There is no immunization history on file for this patient.  TDAP  status: Up to date   Flu Vaccine status: Declined, Education has been provided regarding the importance of this vaccine but patient still declined. Advised may receive this vaccine at local pharmacy or Health Dept. Aware to provide a copy of the vaccination record if obtained from local pharmacy or Health Dept. Verbalized acceptance and understanding.   Pneumococcal vaccine status: Up to date Covid-19 vaccine status: Declined, Education has been provided regarding the importance of this vaccine but patient still declined. Advised may receive this vaccine at local pharmacy or Health Dept.or vaccine clinic. Aware to provide a copy of the vaccination record if obtained from local pharmacy or Health Dept. Verbalized acceptance and understanding.  Qualifies for Shingles Vaccine? Yes   Zostavax completed No   Shingrix Completed?: No.    Education has been provided regarding the importance of this vaccine. Patient has been advised to call insurance company to determine out of pocket expense if they have not yet received this vaccine. Advised may also receive vaccine at local pharmacy or Health Dept. Verbalized acceptance and understanding.  Screening Tests Health Maintenance  Topic Date Due  . PAP SMEAR-Modifier  05/27/2020  . COLON CANCER SCREENING ANNUAL FOBT  08/07/2020  . URINE MICROALBUMIN  10/25/2020  . COVID-19 Vaccine (1) 09/28/2020 (Originally 08/02/1978)  . INFLUENZA VACCINE  01/31/2021 (Originally 06/03/2020)  . TETANUS/TDAP  09/12/2021 (Originally 08/03/1983)  . PNEUMOCOCCAL POLYSACCHARIDE VACCINE AGE 1-64 HIGH RISK  10/15/2022 (Originally 08/02/1966)  . HEMOGLOBIN A1C  03/05/2021  . OPHTHALMOLOGY EXAM  04/06/2021  . FOOT EXAM  09/12/2021  . MAMMOGRAM  05/25/2022  . COLONOSCOPY  06/23/2026  . Hepatitis C Screening  Completed  . HIV Screening  Completed    Health Maintenance  Health Maintenance Due  Topic Date Due  . PAP SMEAR-Modifier  05/27/2020  . COLON CANCER SCREENING ANNUAL  FOBT  08/07/2020  . URINE MICROALBUMIN  10/25/2020  Colorectal cancer screening: Completed normal . Repeat every 5 years Mammogram status: Completed normal . Repeat every year Bone Density Status: Complete   Lung Cancer Screening: (Low Dose CT Chest recommended if Age 3-80 years, 30 pack-year currently smoking OR have quit w/in 15years.) does not qualify.    Additional Screening:  Hepatitis C Screening: does qualify; Completed.   Vision Screening: Recommended annual ophthalmology exams for early detection of glaucoma and other disorders of the eye. Is the patient up to date with their annual eye exam?  Yes    Who is the provider or what is the name of the office in which the patient attends annual eye exams? Dr. Jorja Loa  If pt is not established with a provider, would they like to be referred to a provider to establish care? No .   Dental Screening: Recommended annual dental exams for proper oral hygiene  Community Resource Referral / Chronic Care Management: CRR required this visit?  No   CCM required this visit?  No      Plan:     I have personally reviewed and noted the following in the patient's chart:   . Medical and social history . Use of alcohol, tobacco or illicit drugs  . Current medications and supplements . Functional ability and status . Nutritional status . Physical activity . Advanced directives . List of other physicians . Hospitalizations, surgeries, and ER visits in previous 12 months . Vitals . Screenings to include cognitive, depression, and falls . Referrals and appointments  In addition, I have reviewed and discussed with patient certain preventive protocols, quality metrics, and best practice recommendations. A written personalized care plan for preventive services as well as general preventive health recommendations were provided to patient.     Lonn Georgia, LPN   12/14/1550   Nurse Notes: AWV conducted over the phone with pt  consent to televisit via audio. Pt was in the home at the time of the call and provider located in the office. Call took approx 20 min.     Date:  09/20/2020   Location of Patient: Home Location of Provider: Office Consent was obtain for visit to be over via telehealth. I verified that I am speaking with the correct person using two identifiers.  I connected with  Kelli Spencer on 09/20/20 via telephone and verified that I am speaking with the correct person using two identifiers.   I discussed the limitations of evaluation and management by telemedicine. The patient expressed understanding and agreed to proceed.  She states she refuses the flu shot today because she does not like needles.

## 2020-10-21 ENCOUNTER — Other Ambulatory Visit: Payer: Self-pay | Admitting: Family Medicine

## 2020-10-23 ENCOUNTER — Other Ambulatory Visit: Payer: Self-pay

## 2020-10-23 DIAGNOSIS — IMO0002 Reserved for concepts with insufficient information to code with codable children: Secondary | ICD-10-CM

## 2020-10-23 MED ORDER — ACCU-CHEK AVIVA PLUS VI STRP: ORAL_STRIP | 5 refills | Status: DC

## 2020-10-29 ENCOUNTER — Other Ambulatory Visit: Payer: Self-pay | Admitting: Family Medicine

## 2020-10-29 DIAGNOSIS — IMO0002 Reserved for concepts with insufficient information to code with codable children: Secondary | ICD-10-CM

## 2020-12-25 ENCOUNTER — Encounter: Payer: Self-pay | Admitting: Gastroenterology

## 2020-12-25 ENCOUNTER — Other Ambulatory Visit: Payer: Self-pay

## 2020-12-25 ENCOUNTER — Other Ambulatory Visit: Payer: Self-pay | Admitting: Gastroenterology

## 2020-12-25 ENCOUNTER — Telehealth: Payer: Self-pay | Admitting: *Deleted

## 2020-12-25 ENCOUNTER — Encounter: Payer: Self-pay | Admitting: *Deleted

## 2020-12-25 ENCOUNTER — Ambulatory Visit (INDEPENDENT_AMBULATORY_CARE_PROVIDER_SITE_OTHER): Payer: Medicare Other | Admitting: Gastroenterology

## 2020-12-25 VITALS — BP 130/80 | HR 73 | Temp 97.1°F | Ht 60.0 in | Wt 195.2 lb

## 2020-12-25 DIAGNOSIS — R1319 Other dysphagia: Secondary | ICD-10-CM | POA: Diagnosis not present

## 2020-12-25 DIAGNOSIS — K219 Gastro-esophageal reflux disease without esophagitis: Secondary | ICD-10-CM

## 2020-12-25 MED ORDER — SUCRALFATE 1 GM/10ML PO SUSP: 1.0000 g | Freq: Every day | ORAL | 0 refills | Status: DC

## 2020-12-25 MED ORDER — RABEPRAZOLE SODIUM 20 MG PO TBEC: 20.0000 mg | DELAYED_RELEASE_TABLET | Freq: Two times a day (BID) | ORAL | 5 refills | Status: DC

## 2020-12-25 NOTE — Telephone Encounter (Signed)
PA approved via [facility] website. Auth# [id number] DOS Jan 25, 2021 - Apr 25, 2021

## 2020-12-25 NOTE — Patient Instructions (Addendum)
1. Stop pantoprazole.  Start AcipHex 20 mg, 30 minutes before breakfast and 30 minutes before your evening meal. 2. Sulcrafate 10 mL at bedtime for 1 month. 3. Continue Amitiza 24 mcg twice daily with food for constipation. 4. Upper endoscopy as scheduled.  Please see separate instructions. 5. Colonoscopy will be due in August 2022.     Gastroesophageal Reflux Disease, Adult Gastroesophageal reflux (GER) happens when acid from the stomach flows up into the tube that connects the mouth and the stomach (esophagus). Normally, food travels down the esophagus and stays in the stomach to be digested. However, when a person has GER, food and stomach acid sometimes move back up into the esophagus. If this becomes a more serious problem, the person may be diagnosed with a disease called gastroesophageal reflux disease (GERD). GERD occurs when the reflux:  Happens often.  Causes frequent or severe symptoms.  Causes problems such as damage to the esophagus. When stomach acid comes in contact with the esophagus, the acid may cause inflammation in the esophagus. Over time, GERD may create small holes (ulcers) in the lining of the esophagus. What are the causes? This condition is caused by a problem with the muscle between the esophagus and the stomach (lower esophageal sphincter, or LES). Normally, the LES muscle closes after food passes through the esophagus to the stomach. When the LES is weakened or abnormal, it does not close properly, and that allows food and stomach acid to go back up into the esophagus. The LES can be weakened by certain dietary substances, medicines, and medical conditions, including:  Tobacco use.  Pregnancy.  Having a hiatal hernia.  Alcohol use.  Certain foods and beverages, such as coffee, chocolate, onions, and peppermint. What increases the risk? You are more likely to develop this condition if you:  Have an increased body weight.  Have a connective tissue  disorder.  Take NSAIDs, such as ibuprofen. What are the signs or symptoms? Symptoms of this condition include:  Heartburn.  Difficult or painful swallowing and the feeling of having a lump in the throat.  A bitter taste in the mouth.  Bad breath and having a large amount of saliva.  Having an upset or bloated stomach and belching.  Chest pain. Different conditions can cause chest pain. Make sure you see your health care provider if you experience chest pain.  Shortness of breath or wheezing.  Ongoing (chronic) cough or a nighttime cough.  Wearing away of tooth enamel.  Weight loss. How is this diagnosed? This condition may be diagnosed based on a medical history and a physical exam. To determine if you have mild or severe GERD, your health care provider may also monitor how you respond to treatment. You may also have tests, including:  A test to examine your stomach and esophagus with a small camera (endoscopy).  A test that measures the acidity level in your esophagus.  A test that measures how much pressure is on your esophagus.  A barium swallow or modified barium swallow test to show the shape, size, and functioning of your esophagus. How is this treated? Treatment for this condition may vary depending on how severe your symptoms are. Your health care provider may recommend:  Changes to your diet.  Medicine.  Surgery. The goal of treatment is to help relieve your symptoms and to prevent complications. Follow these instructions at home: Eating and drinking  Follow a diet as recommended by your health care provider. This may involve avoiding foods and  drinks such as: ? Coffee and tea, with or without caffeine. ? Drinks that contain alcohol. ? Energy drinks and sports drinks. ? Carbonated drinks or sodas. ? Chocolate and cocoa. ? Peppermint and mint flavorings. ? Garlic and onions. ? Horseradish. ? Spicy and acidic foods, including peppers, chili powder, curry  powder, vinegar, hot sauces, and barbecue sauce. ? Citrus fruit juices and citrus fruits, such as oranges, lemons, and limes. ? Tomato-based foods, such as red sauce, chili, salsa, and pizza with red sauce. ? Fried and fatty foods, such as donuts, french fries, potato chips, and high-fat dressings. ? High-fat meats, such as hot dogs and fatty cuts of red and white meats, such as rib eye steak, sausage, ham, and bacon. ? High-fat dairy items, such as whole milk, butter, and cream cheese.  Eat small, frequent meals instead of large meals.  Avoid drinking large amounts of liquid with your meals.  Avoid eating meals during the 2-3 hours before bedtime.  Avoid lying down right after you eat.  Do not exercise right after you eat.   Lifestyle  Do not use any products that contain nicotine or tobacco. These products include cigarettes, chewing tobacco, and vaping devices, such as e-cigarettes. If you need help quitting, ask your health care provider.  Try to reduce your stress by using methods such as yoga or meditation. If you need help reducing stress, ask your health care provider.  If you are overweight, reduce your weight to an amount that is healthy for you. Ask your health care provider for guidance about a safe weight loss goal.   General instructions  Pay attention to any changes in your symptoms.  Take over-the-counter and prescription medicines only as told by your health care provider. Do not take aspirin, ibuprofen, or other NSAIDs unless your health care provider told you to take these medicines.  Wear loose-fitting clothing. Do not wear anything tight around your waist that causes pressure on your abdomen.  Raise (elevate) the head of your bed about 6 inches (15 cm). You can use a wedge to do this.  Avoid bending over if this makes your symptoms worse.  Keep all follow-up visits. This is important. Contact a health care provider if:  You have: ? New  symptoms. ? Unexplained weight loss. ? Difficulty swallowing or it hurts to swallow. ? Wheezing or a persistent cough. ? A hoarse voice.  Your symptoms do not improve with treatment. Get help right away if:  You have sudden pain in your arms, neck, jaw, teeth, or back.  You suddenly feel sweaty, dizzy, or light-headed.  You have chest pain or shortness of breath.  You vomit and the vomit is green, yellow, or black, or it looks like blood or coffee grounds.  You faint.  You have stool that is red, bloody, or black.  You cannot swallow, drink, or eat. These symptoms may represent a serious problem that is an emergency. Do not wait to see if the symptoms will go away. Get medical help right away. Call your local emergency services (911 in the [place] Do not drive yourself to the hospital. Summary  Gastroesophageal reflux happens when acid from the stomach flows up into the esophagus. GERD is a disease in which the reflux happens often, causes frequent or severe symptoms, or causes problems such as damage to the esophagus.  Treatment for this condition may vary depending on how severe your symptoms are. Your health care provider may recommend diet and lifestyle changes, medicine,  or surgery.  Contact a health care provider if you have new or worsening symptoms.  Take over-the-counter and prescription medicines only as told by your health care provider. Do not take aspirin, ibuprofen, or other NSAIDs unless your health care provider told you to do so.  Keep all follow-up visits as told by your health care provider. This is important. This information is not intended to replace advice given to you by your health care provider. Make sure you discuss any questions you have with your health care provider. Document Revised: 04/30/2020 Document Reviewed: 04/30/2020 Elsevier Patient Education  2021 [place] for Gastroesophageal Reflux Disease, Adult When you have  gastroesophageal reflux disease (GERD), the foods you eat and your eating habits are very important. Choosing the right foods can help ease the discomfort of GERD. Consider working with a dietitian to help you make healthy food choices. What are tips for following this plan? Reading food labels  Look for foods that are low in saturated fat. Foods that have less than 5% of daily value (DV) of fat and 0 g of trans fats may help with your symptoms. Cooking  Cook foods using methods other than frying. This may include baking, steaming, grilling, or broiling. These are all methods that do not need a lot of fat for cooking.  To add flavor, try to use herbs that are low in spice and acidity. Meal planning  Choose healthy foods that are low in fat, such as fruits, vegetables, whole grains, low-fat dairy products, lean meats, fish, and poultry.  Eat frequent, small meals instead of three large meals each day. Eat your meals slowly, in a relaxed setting. Avoid bending over or lying down until 2-3 hours after eating.  Limit high-fat foods such as fatty meats or fried foods.  Limit your intake of fatty foods, such as oils, butter, and shortening.  Avoid the following as told by your health care provider: ? Foods that cause symptoms. These may be different for different people. Keep a food diary to keep track of foods that cause symptoms. ? Alcohol. ? Drinking large amounts of liquid with meals. ? Eating meals during the 2-3 hours before bed.   Lifestyle  Maintain a healthy weight. Ask your health care provider what weight is healthy for you. If you need to lose weight, work with your health care provider to do so safely.  Exercise for at least 30 minutes on 5 or more days each week, or as told by your health care provider.  Avoid wearing clothes that fit tightly around your waist and chest.  Do not use any products that contain nicotine or tobacco. These products include cigarettes, chewing  tobacco, and vaping devices, such as e-cigarettes. If you need help quitting, ask your health care provider.  Sleep with the head of your bed raised. Use a wedge under the mattress or blocks under the bed frame to raise the head of the bed.  Chew sugar-free gum after mealtimes. What foods should I eat? Eat a healthy, well-balanced diet of fruits, vegetables, whole grains, low-fat dairy products, lean meats, fish, and poultry. Each person is different. Foods that may trigger symptoms in one person may not trigger any symptoms in another person. Work with your health care provider to identify foods that are safe for you. The items listed above may not be a complete list of recommended foods and beverages. Contact a dietitian for more information.   What foods should I avoid?  Limiting some of these foods may help manage the symptoms of GERD. Everyone is different. Consult a dietitian or your health care provider to help you identify the exact foods to avoid, if any. Fruits Any fruits prepared with added fat. Any fruits that cause symptoms. For some people this may include citrus fruits, such as oranges, grapefruit, pineapple, and lemons. Vegetables Deep-fried vegetables. [place] fries. Any vegetables prepared with added fat. Any vegetables that cause symptoms. For some people, this may include tomatoes and tomato products, chili peppers, onions and garlic, and horseradish. Grains Pastries or quick breads with added fat. Meats and other proteins High-fat meats, such as fatty beef or pork, hot dogs, ribs, ham, sausage, salami, and bacon. Fried meat or protein, including fried fish and fried chicken. Nuts and nut butters, in large amounts. Dairy Whole milk and chocolate milk. Sour cream. Cream. Ice cream. Cream cheese. Milkshakes. Fats and oils Butter. Margarine. Shortening. Ghee. Beverages Coffee and tea, with or without caffeine. Carbonated beverages. Sodas. Energy drinks. Fruit juice made with  acidic fruits, such as orange or grapefruit. Tomato juice. Alcoholic drinks. Sweets and desserts Chocolate and cocoa. Donuts. Seasonings and condiments Pepper. Peppermint and spearmint. Added salt. Any condiments, herbs, or seasonings that cause symptoms. For some people, this may include curry, hot sauce, or vinegar-based salad dressings. The items listed above may not be a complete list of foods and beverages to avoid. Contact a dietitian for more information. Questions to ask your health care provider Diet and lifestyle changes are usually the first steps that are taken to manage symptoms of GERD. If diet and lifestyle changes do not improve your symptoms, talk with your health care provider about taking medicines. Where to find more information  International Foundation for Gastrointestinal Disorders: aboutgerd.org Summary  When you have gastroesophageal reflux disease (GERD), food and lifestyle choices may be very helpful in easing the discomfort of GERD.  Eat frequent, small meals instead of three large meals each day. Eat your meals slowly, in a relaxed setting. Avoid bending over or lying down until 2-3 hours after eating.  Limit high-fat foods such as fatty meats or fried foods. This information is not intended to replace advice given to you by your health care provider. Make sure you discuss any questions you have with your health care provider. Document Revised: 04/30/2020 Document Reviewed: 04/30/2020 Elsevier Patient Education  [place].

## 2020-12-25 NOTE — Progress Notes (Signed)
Cc'ed to pcp °

## 2020-12-25 NOTE — Progress Notes (Signed)
Primary Care Physician:  Fayrene Helper, MD  Primary Gastroenterologist:  Elon Alas. Abbey Chatters, DO   Chief Complaint  Patient presents with  . Gastroesophageal Reflux  . Dysphagia    Difficulty sleeping at night d/t feeling of something stuck in throat. Feels like she did prior to last dilation    HPI:  Kelli Spencer is a 55 y.o. female here for follow-up.  Last seen March 2020 for history of GERD, dysphagia, constipation.  EGD May 2019 for refractory GERD/dysphagia: Benign esophageal stricture status post dilation, gastritis without H. pylori, benign fundic gland gastric polyps.  Colonoscopy August 2017: Prep not adequate to detect polyps less than 6 mm, redundant left colon, hemorrhoids.  Next colonoscopy August 2022.  Several weeks of nocturnal reflux symptoms. Burning in throat area and feels lot of pressure especially when lays down. Having some solid food and pill dysphagia. Feels like before she had her esophagus stretched. Esophageal dilation helped a lot in 2019.  Waits at least 6 to 7 hours between her 3 PM meal and laying down at night.  When she wakes up with reflux, she tries over-the-counter chewable antacids, but no relief.  Scared to eat because of the discomfort.  Sister gave her some of her Dexilant, she took it for 2 to 3 weeks and initially seem to be helping but over the past week lost effects.  Previously failed Nexium and Prilosec.  Denies abdominal pain. BM regular. No melena, brbpr.          Current Outpatient Medications  Medication Sig Dispense Refill  . Accu-Chek Softclix Lancets lancets USE TO TEST blood sugar twice daily  Dx E11.65 100 each 5  . allopurinol (ZYLOPRIM) 300 MG tablet TAKE 1 TABLET BY MOUTH  DAILY 90 tablet 3  . blood glucose meter kit and supplies Dispense based on patient and insurance preference. Once daily testing dx e11.9 1 each 0  . cholecalciferol (VITAMIN D) 400 UNITS TABS tablet Take 800 Units by mouth daily.    .  clotrimazole-betamethasone (LOTRISONE) cream Apply cream twice daily to rash for 1 week, then as needed 45 g 0  . glipiZIDE (GLUCOTROL) 10 MG tablet TAKE 1 TABLET BY MOUTH  TWICE DAILY BEFORE MEALS 180 tablet 3  . glucose blood (ACCU-CHEK AVIVA PLUS) test strip USE TO TEST BLOOD SUGAR twice daily DX E11.65 100 each 5  . hydrochlorothiazide (MICROZIDE) 12.5 MG capsule TAKE 1 CAPSULE BY MOUTH  DAILY 90 capsule 3  . hydrOXYzine (ATARAX/VISTARIL) 50 MG tablet Take 1 tablet by mouth at bedtime for itching or anxiety (Patient taking differently: as needed. Take 1 tablet by mouth at bedtime for itching or anxiety) 90 tablet 1  . linagliptin (TRADJENTA) 5 MG TABS tablet TAKE 1 TABLET(5 MG) BY MOUTH DAILY 90 tablet 3  . lubiprostone (AMITIZA) 24 MCG capsule TAKE 1 CAPSULE(24 MCG) BY MOUTH TWICE DAILY WITH A MEAL 180 capsule 3  . metFORMIN (GLUCOPHAGE) 500 MG tablet TAKE 1 TABLET BY MOUTH  TWICE DAILY WITH MEALS 180 tablet 3  . pantoprazole (PROTONIX) 40 MG tablet TAKE 1 TABLET BY MOUTH  TWICE DAILY BEFORE A MEAL 180 tablet 3  . potassium chloride (KLOR-CON) 10 MEQ tablet TAKE 1 TABLET(10 MEQ) BY MOUTH DAILY 90 tablet 3  . pravastatin (PRAVACHOL) 10 MG tablet TAKE 1 TABLET(10 MG) BY MOUTH DAILY 90 tablet 2  . tiZANidine (ZANAFLEX) 4 MG capsule Take 1 capsule (4 mg total) by mouth at bedtime as needed for muscle spasms. 30 capsule  0   No current facility-administered medications for this visit.    Allergies as of 12/25/2020 - Review Complete 12/25/2020  Allergen Reaction Noted  . Prednisone Swelling and Other (See Comments) 06/09/2013  . Amlodipine Itching and Swelling 11/05/2017  . Ace inhibitors Cough 07/04/2016  . Metformin and related Diarrhea 05/05/2016    Past Medical History:  Diagnosis Date  . Arthritis   . Asthma   . Diabetes mellitus without complication ([place])   . Diastolic hypertension   . GERD (gastroesophageal reflux disease)   . Hyperlipidemia   . Hypertension   . Migraines   .  Obesity   . Seizures ([place])    pt states she has seizures only when she gets upset. pt is taking no seizure meds    Past Surgical History:  Procedure Laterality Date  . ABDOMINAL HYSTERECTOMY  1997   fibroids  . COLONOSCOPY N/A 06/23/2016   Dr. Oneida Alar: prep not adequate to detect polyps less than 56m, redundant left colon. hemorrhoids. next TCS in 5 years.   . ESOPHAGOGASTRODUODENOSCOPY N/A 04/02/2018   Dr. fOneida Alar Benign-appearing esophageal stricture status post dilation, multiple benign gastric polyps, mild gastritis no H. pylori.  .Marland KitchenKNEE ARTHROSCOPY WITH MEDIAL MENISECTOMY Left 02/09/2014   Procedure: KNEE ARTHROSCOPY WITH MEDIAL MENISECTOMY;  Surgeon: SCarole Civil MD;  Location: [facility];  Service: Orthopedics;  Laterality: Left;  . PARTIAL HYSTERECTOMY    . SAVORY DILATION N/A 04/02/2018   Procedure: SAVORY DILATION;  Surgeon: FDanie Binder MD;  Location: [facility];  Service: Endoscopy;  Laterality: N/A;  . TOTAL KNEE ARTHROPLASTY Right 03/18/2017   Procedure: TOTAL KNEE ARTHROPLASTY;  Surgeon: HCarole Civil MD;  Location: [facility];  Service: Orthopedics;  Laterality: Right;    Family History  Problem Relation Age of Onset  . Kidney failure Mother   . Diabetes Mother   . Hypertension Mother   . Stroke Mother   . Diabetes Father   . Heart disease Father   . Hypertension Sister   . Colon cancer Neg Hx     Social History   Socioeconomic History  . Marital status: Divorced    Spouse name: Not on file  . Number of children: 3  . Years of education: Not on file  . Highest education level: 9th grade  Occupational History  . Occupation: disabled  Tobacco Use  . Smoking status: Never Smoker  . Smokeless tobacco: Never Used  Vaping Use  . Vaping Use: Never used  Substance and Sexual Activity  . Alcohol use: No  . Drug use: No  . Sexual activity: Not Currently  Other Topics Concern  . Not on file  Social History Narrative   Daughter currently living with  her until she can get her own place.    Social Determinants of Health   Financial Resource Strain: Low Risk   . Difficulty of Paying Living Expenses: Not hard at all  Food Insecurity: No Food Insecurity  . Worried About RCharity fundraiserin the Last Year: Never true  . Ran Out of Food in the Last Year: Never true  Transportation Needs: No Transportation Needs  . Lack of Transportation (Medical): No  . Lack of Transportation (Non-Medical): No  Physical Activity: Insufficiently Active  . Days of Exercise per Week: 4 days  . Minutes of Exercise per Session: 30 min  Stress: No Stress Concern Present  . Feeling of Stress : Not at all  Social Connections: Moderately Isolated  . Frequency  of Communication with Friends and Family: Three times a week  . Frequency of Social Gatherings with Friends and Family: Twice a week  . Attends Religious Services: More than 4 times per year  . Active Member of Clubs or Organizations: No  . Attends Archivist Meetings: Never  . Marital Status: Divorced  Human resources officer Violence: Not At Risk  . Fear of Current or Ex-Partner: No  . Emotionally Abused: No  . Physically Abused: No  . Sexually Abused: No      ROS:  General: Negative for anorexia, weight loss, fever, chills, fatigue, weakness. Eyes: Negative for vision changes.  ENT: Negative for hoarseness, nasal congestion.  See HPI. CV: Negative for chest pain, angina, palpitations, dyspnea on exertion, peripheral edema.  Respiratory: Negative for dyspnea at rest, dyspnea on exertion, cough, sputum, wheezing.  GI: See history of present illness. GU:  Negative for dysuria, hematuria, urinary incontinence, urinary frequency, nocturnal urination.  MS: Negative for joint pain, low back pain.  Derm: Negative for rash or itching.  Neuro: Negative for weakness, abnormal sensation, seizure, frequent headaches, memory loss, confusion.  Psych: Negative for anxiety, depression, suicidal ideation,  hallucinations.  Endo: Negative for unusual weight change.  Heme: Negative for bruising or bleeding. Allergy: Negative for rash or hives.    Physical Examination:  BP 130/80   Pulse 73   Temp (!) 97.1 F (36.2 C) (Temporal)   Ht 5' (1.524 m)   Wt 195 lb 3.2 oz (88.5 kg)   LMP 06/10/1988 (Approximate)   BMI 38.12 kg/m    General: Well-nourished, well-developed in no acute distress.  Head: Normocephalic, atraumatic.   Eyes: Conjunctiva pink, no icterus. Mouth: masked Neck: Supple without thyromegaly, masses, or lymphadenopathy.  Lungs: Clear to auscultation bilaterally.  Heart: Regular rate and rhythm, no murmurs rubs or gallops.  Abdomen: Bowel sounds are normal, nontender, nondistended, no hepatosplenomegaly or masses, no abdominal bruits or    hernia , no rebound or guarding.   Rectal: not performed Extremities: No lower extremity edema. No clubbing or deformities.  Neuro: Alert and oriented x 4 , grossly normal neurologically.  Skin: Warm and dry, no rash or jaundice.   Psych: Alert and cooperative, normal mood and affect.  Labs: Lab Results  Component Value Date   CREATININE 1.11 (H) 09/05/2020   BUN 23 09/05/2020   NA 140 09/05/2020   K 4.0 09/05/2020   CL 98 09/05/2020   CO2 30 09/05/2020   Lab Results  Component Value Date   ALT 18 09/05/2020   AST 15 09/05/2020   ALKPHOS 110 04/28/2017   BILITOT 0.4 09/05/2020   Lab Results  Component Value Date   WBC 7.2 09/05/2020   HGB 12.2 09/05/2020   HCT 37.1 09/05/2020   MCV 91.4 09/05/2020   PLT 326 09/05/2020   Lab Results  Component Value Date   HGBA1C 7.3 (H) 09/05/2020     Imaging Studies: No results found.   Impression:  Pleasant 55 year old female with history of chronic GERD, constipation, dysphagia presenting with refractory reflux symptoms.  GERD/dysphagia: Has been on pantoprazole 40 mg twice daily for several years.  Couple of months ago started having breakthrough nocturnal symptoms,  unresponsive to over-the-counter antacids.  Recently tried her sister's Dexilant for a few weeks, initially noted some improvement but now seems to not be helping.  She is trying to watch her diet.  Waiting hours between eating and laying down.  Almost afraid to eat.  Weight is stable.  Complains of solid food and pill dysphagia like she had prior to EGD with dilation in 2019.  Dysphagia may be secondary to reflux esophagitis, esophageal web/ring/stricture.  It is not clear why her symptoms are so refractory.  Would recommend upper endoscopy with esophageal dilation in the near future.   Constipation: Doing well on Amitiza 24 mcg twice daily.  Plan:  1. EGD with esophageal dilation with Dr. Abbey Chatters. ASA II.  I have discussed the risks, alternatives, benefits with regards to but not limited to the risk of reaction to medication, bleeding, infection, perforation and the patient is agreeable to proceed. Written consent to be obtained. 2. Switch pantoprazole to AcipHex 20 mg twice daily before meals. 3. Add Carafate 1 g at bedtime for 1 month to treat refractory symptoms. 4. Reinforced antireflux measures.  Handout provided. 5. She will be due for colonoscopy August 2022 because of inadequate bowel prep in 2017.  Patient would like to postpone until later in the year.  She is on recall calendar.

## 2021-01-09 ENCOUNTER — Other Ambulatory Visit: Payer: Self-pay

## 2021-01-09 ENCOUNTER — Encounter: Payer: Self-pay | Admitting: Family Medicine

## 2021-01-09 ENCOUNTER — Telehealth (INDEPENDENT_AMBULATORY_CARE_PROVIDER_SITE_OTHER): Payer: Medicare Other | Admitting: Family Medicine

## 2021-01-09 VITALS — Ht 60.0 in | Wt 191.0 lb

## 2021-01-09 DIAGNOSIS — E785 Hyperlipidemia, unspecified: Secondary | ICD-10-CM

## 2021-01-09 DIAGNOSIS — R1319 Other dysphagia: Secondary | ICD-10-CM | POA: Diagnosis not present

## 2021-01-09 DIAGNOSIS — I10 Essential (primary) hypertension: Secondary | ICD-10-CM | POA: Diagnosis not present

## 2021-01-09 DIAGNOSIS — K219 Gastro-esophageal reflux disease without esophagitis: Secondary | ICD-10-CM | POA: Diagnosis not present

## 2021-01-09 DIAGNOSIS — E1159 Type 2 diabetes mellitus with other circulatory complications: Secondary | ICD-10-CM

## 2021-01-09 DIAGNOSIS — E559 Vitamin D deficiency, unspecified: Secondary | ICD-10-CM | POA: Diagnosis not present

## 2021-01-09 DIAGNOSIS — Z1231 Encounter for screening mammogram for malignant neoplasm of breast: Secondary | ICD-10-CM | POA: Diagnosis not present

## 2021-01-09 DIAGNOSIS — M10079 Idiopathic gout, unspecified ankle and foot: Secondary | ICD-10-CM | POA: Diagnosis not present

## 2021-01-09 NOTE — Assessment & Plan Note (Signed)
Hyperlipidemia:Low fat diet discussed and encouraged.   Lipid Panel  Lab Results  Component Value Date   CHOL 125 09/05/2020   HDL 44 (L) 09/05/2020   LDLCALC 66 09/05/2020   TRIG 72 09/05/2020   CHOLHDL 2.8 09/05/2020

## 2021-01-09 NOTE — Assessment & Plan Note (Signed)
2 week h/o dysphagia , has GI appt scheduled

## 2021-01-09 NOTE — Assessment & Plan Note (Signed)
DASH diet and commitment to daily physical activity for a minimum of 30 minutes discussed and encouraged, as a part of hypertension management. The importance of attaining a healthy weight is also discussed.  BP/Weight 01/09/2021 12/25/2020 09/11/2020 05/17/2020 03/19/2020 11/08/2019 8/46/6599  Systolic BP - 357 017 793 - 903 009  Diastolic BP - 80 82 77 - 84 82  Wt. (Lbs) 191 195.2 190 190 188 179.2 194  BMI 37.3 38.12 37.11 37.11 36.72 35 37.89

## 2021-01-09 NOTE — Assessment & Plan Note (Signed)
Uncontrolled with dysphagia, scheduled for dilation

## 2021-01-09 NOTE — Assessment & Plan Note (Signed)
Kelli Spencer is reminded of the importance of commitment to daily physical activity for 30 minutes or more, as able and the need to limit carbohydrate intake to 30 to 60 grams per meal to help with blood sugar control.   The need to take medication as prescribed, test blood sugar as directed, and to call between visits if there is a concern that blood sugar is uncontrolled is also discussed.   Kelli Spencer is reminded of the importance of daily foot exam, annual eye examination, and good blood sugar, blood pressure and cholesterol control.  Diabetic Labs Latest Ref Rng & Units 09/05/2020 05/17/2020 05/17/2020 05/17/2020 05/17/2020  HbA1c <5.7 % of total Hgb 7.3(H) 6.9(A) 6.9 6.9 6.9(A)  Microalbumin mg/dL - - - - -  Micro/Creat Ratio <30 mcg/mg creat - - - - -  Chol <200 mg/dL 125 - - - -  HDL > OR = 50 mg/dL 44(L) - - - -  Calc LDL mg/dL (calc) 66 - - - -  Triglycerides <150 mg/dL 72 - - - -  Creatinine 0.50 - 1.05 mg/dL 1.11(H) - - - -   BP/Weight 01/09/2021 12/25/2020 09/11/2020 05/17/2020 03/19/2020 11/08/2019 04/19/8371  Systolic BP - 902 111 552 - 080 223  Diastolic BP - 80 82 77 - 84 82  Wt. (Lbs) 191 195.2 190 190 188 179.2 194  BMI 37.3 38.12 37.11 37.11 36.72 35 37.89   Foot/eye exam completion dates Latest Ref Rng & Units 09/11/2020 04/06/2020  Eye Exam No Retinopathy - No Retinopathy  Foot Form Completion - Done -

## 2021-01-09 NOTE — Assessment & Plan Note (Signed)
No recent flare.  

## 2021-01-09 NOTE — Assessment & Plan Note (Signed)
  Patient re-educated about  the importance of commitment to a  minimum of 150 minutes of exercise per week as able.  The importance of healthy food choices with portion control discussed, as well as eating regularly and within a 12 hour window most days. The need to choose "clean , green" food 50 to 75% of the time is discussed, as well as to make water the primary drink and set a goal of 64 ounces water daily.    Weight /BMI 01/09/2021 12/25/2020 09/11/2020  WEIGHT 191 lb 195 lb 3.2 oz 190 lb  HEIGHT 5\' 0"  5\' 0"  5\' 0"   BMI 37.3 kg/m2 38.12 kg/m2 37.11 kg/m2

## 2021-01-09 NOTE — Assessment & Plan Note (Signed)
Updated lab needed at/ before next visit.   

## 2021-01-09 NOTE — Addendum Note (Signed)
Addended by: Eual Fines on: 01/09/2021 10:02 AM   Modules accepted: Orders

## 2021-01-09 NOTE — Patient Instructions (Addendum)
F/U with pap early august, call if you need me sooner   Please schedule July mammogram at checkout  Non fasting HBa1C, chem 7 and EGFr and microalb this week  Generic claritin is prescribed for allergies  Blood sugar sounds excellent  It is important that you exercise regularly at least 30 minutes 5 times a week. If you develop chest pain, have severe difficulty breathing, or feel very tired, stop exercising immediately and seek medical attention    Sit-to-Stand Exercise  The sit-to-stand exercise (also known as the chair stand or chair rise exercise) strengthens your lower body and helps you maintain or improve your mobility and independence. The goal is to do the sit-to-stand exercise without using your hands. This will be easier as you become stronger. You should always talk with your health care provider before starting any exercise program, especially if you have had recent surgery. Do the exercise exactly as told by your health care provider and adjust it as directed. It is normal to feel mild stretching, pulling, tightness, or discomfort as you do this exercise, but you should stop right away if you feel sudden pain or your pain gets worse. Do not begin doing this exercise until told by your health care provider. What the sit-to-stand exercise does The sit-to-stand exercise helps to strengthen the muscles in your thighs and the muscles in the center of your body that give you stability (core muscles). This exercise is especially helpful if:  You have had knee or hip surgery.  You have trouble getting up from a chair, out of a car, or off the toilet. How to do the sit-to-stand exercise 1. Sit toward the front edge of a sturdy chair without armrests. Your knees should be bent and your feet should be flat on the floor and shoulder-width apart. 2. Place your hands lightly on each side of the seat. Keep your back and neck as straight as possible, with your chest slightly  forward. 3. Breathe in slowly. Lean forward and slightly shift your weight to the front of your feet. 4. Breathe out as you slowly stand up. Use your hands as little as possible. 5. Stand and pause for a full breath in and out. 6. Breathe in as you sit down slowly. Tighten your core and abdominal muscles to control your lowering as much as possible. 7. Breathe out slowly. 8. Do this exercise 10-15 times. If needed, do it fewer times until you build up strength. 9. Rest for 1 minute, then do another set of 10-15 repetitions. To change the difficulty of the sit-to-stand exercise  If the exercise is too difficult, use a chair with sturdy armrests, and push off the armrests to help you come to the standing position. You can also use the armrests to help slowly lower yourself back to sitting. As this gets easier, try to use your arms less. You can also place a firm cushion or pillow on the chair to make the surface higher.  If this exercise is too easy, do not use your arms to help raise or lower yourself. You can also wear a weighted vest, use hand weights, increase your repetitions, or try a lower chair. General tips  You may feel tired when starting an exercise routine. This is normal.  You may have muscle soreness that lasts a few days. This is normal. As you get stronger, you may not feel muscle soreness.  Use smooth, steady movements.  Do not  hold your breath during strength exercises.  This can cause unsafe changes in your blood pressure.  Breathe in slowly through your nose, and breathe out slowly through your mouth. Summary  Strengthening your lower body is an important step to help you move safely and independently.  The sit-to-stand exercise helps strengthen the muscles in your thighs and core.  You should always talk with your health care provider before starting any exercise program, especially if you have had recent surgery. This information is not intended to replace advice  given to you by your health care provider. Make sure you discuss any questions you have with your health care provider. Document Revised: 08/18/2018 Document Reviewed: 12/11/2016 Elsevier Patient Education  [place].

## 2021-01-09 NOTE — Progress Notes (Signed)
Virtual Visit via Telephone Note  I connected with Kelli Spencer on 01/09/21 at  8:00 AM EST by telephone and verified that I am speaking with the correct person using two identifiers.  Location: Patient: home Provider:  office   I discussed the limitations, risks, security and privacy concerns of performing an evaluation and management service by telephone and the availability of in person appointments. I also discussed with the patient that there may be a patient responsible charge related to this service. The patient expressed understanding and agreed to proceed.   History of Present Illness: F/U chronic problems and address any new or current concerns. Review and update medications and allergies. Review recent lab and radiologic data . Update routine health maintainace. Review an encourage improved health habits to include nutrition, exercise and  sleep . Denies polyuria, polydipsia, blurred vision , or hypoglycemic episodes. Tests every morning today is 89 , generally between 90 1000  Denies recent fever or chills. c/o sinus pressure, nasal congestion,denies  ear pain or sore throat. Denies chest congestion, productive cough or wheezing. Denies chest pains, palpitations and leg swelling Denies abdominal pain, nausea, vomiting,diarrhea or constipation.   Denies dysuria, frequency, hesitancy or incontinence. Denies joint pain, swelling and limitation in mobility. Denies headaches, seizures, numbness, or tingling. Denies depression, anxiety or insomnia. Denies skin break down or rash.       Observations/Objective: Ht 5' (1.524 m)   Wt 191 lb (86.6 kg)   LMP 06/10/1988 (Approximate)   BMI 37.30 kg/m  Good communication with no confusion and intact memory. Alert and oriented x 3 No signs of respiratory distress during speech    Assessment and Plan:  Essential hypertension DASH diet and commitment to daily physical activity for a minimum of 30 minutes discussed and  encouraged, as a part of hypertension management. The importance of attaining a healthy weight is also discussed.  BP/Weight 01/09/2021 12/25/2020 09/11/2020 05/17/2020 03/19/2020 11/08/2019 0/93/2671  Systolic BP - 245 809 983 - 382 505  Diastolic BP - 80 82 77 - 84 82  Wt. (Lbs) 191 195.2 190 190 188 179.2 194  BMI 37.3 38.12 37.11 37.11 36.72 35 37.89       Type 2 diabetes mellitus with vascular disease ([place]) Kelli Spencer is reminded of the importance of commitment to daily physical activity for 30 minutes or more, as able and the need to limit carbohydrate intake to 30 to 60 grams per meal to help with blood sugar control.   The need to take medication as prescribed, test blood sugar as directed, and to call between visits if there is a concern that blood sugar is uncontrolled is also discussed.   Kelli Spencer is reminded of the importance of daily foot exam, annual eye examination, and good blood sugar, blood pressure and cholesterol control.  Diabetic Labs Latest Ref Rng & Units 09/05/2020 05/17/2020 05/17/2020 05/17/2020 05/17/2020  HbA1c <5.7 % of total Hgb 7.3(H) 6.9(A) 6.9 6.9 6.9(A)  Microalbumin mg/dL - - - - -  Micro/Creat Ratio <30 mcg/mg creat - - - - -  Chol <200 mg/dL 125 - - - -  HDL > OR = 50 mg/dL 44(L) - - - -  Calc LDL mg/dL (calc) 66 - - - -  Triglycerides <150 mg/dL 72 - - - -  Creatinine 0.50 - 1.05 mg/dL 1.11(H) - - - -   BP/Weight 01/09/2021 12/25/2020 09/11/2020 05/17/2020 03/19/2020 11/08/2019 3/97/6734  Systolic BP - 193 790 240 - 973 532  Diastolic  BP - 80 82 77 - 84 82  Wt. (Lbs) 191 195.2 190 190 188 179.2 194  BMI 37.3 38.12 37.11 37.11 36.72 35 37.89   Foot/eye exam completion dates Latest Ref Rng & Units 09/11/2020 04/06/2020  Eye Exam No Retinopathy - No Retinopathy  Foot Form Completion - Done -        Morbid obesity ([place])  Patient re-educated about  the importance of commitment to a  minimum of 150 minutes of exercise per week as able.  The importance of  healthy food choices with portion control discussed, as well as eating regularly and within a 12 hour window most days. The need to choose "clean , green" food 50 to 75% of the time is discussed, as well as to make water the primary drink and set a goal of 64 ounces water daily.    Weight /BMI 01/09/2021 12/25/2020 09/11/2020  WEIGHT 191 lb 195 lb 3.2 oz 190 lb  HEIGHT 5\' 0"  5\' 0"  5\' 0"   BMI 37.3 kg/m2 38.12 kg/m2 37.11 kg/m2      Dyslipidemia, goal LDL below 100 Hyperlipidemia:Low fat diet discussed and encouraged.   Lipid Panel  Lab Results  Component Value Date   CHOL 125 09/05/2020   HDL 44 (L) 09/05/2020   LDLCALC 66 09/05/2020   TRIG 72 09/05/2020   CHOLHDL 2.8 09/05/2020       Vitamin D deficiency Updated lab needed at/ before next visit.   Esophageal dysphagia 2 week h/o dysphagia , has GI appt scheduled  GERD (gastroesophageal reflux disease) Uncontrolled with dysphagia, scheduled for dilation  Gout No recent flare    Follow Up Instructions:    I discussed the assessment and treatment plan with the patient. The patient was provided an opportunity to ask questions and all were answered. The patient agreed with the plan and demonstrated an understanding of the instructions.   The patient was advised to call back or seek an in-person evaluation if the symptoms worsen or if the condition fails to improve as anticipated.  I provided 22 minutes of non-face-to-face time during this encounter.   Tula Nakayama, MD

## 2021-01-10 DIAGNOSIS — E1159 Type 2 diabetes mellitus with other circulatory complications: Secondary | ICD-10-CM | POA: Diagnosis not present

## 2021-01-12 ENCOUNTER — Other Ambulatory Visit: Payer: Self-pay | Admitting: Family Medicine

## 2021-01-12 LAB — MICROALBUMIN / CREATININE URINE RATIO
Creatinine, Urine: 149 mg/dL
Microalb/Creat Ratio: 5 mg/g creat (ref 0–29)
Microalbumin, Urine: 7.1 ug/mL

## 2021-01-12 LAB — BMP8+EGFR
BUN/Creatinine Ratio: 18 (ref 9–23)
BUN: 24 mg/dL (ref 6–24)
CO2: 23 mmol/L (ref 20–29)
Calcium: 10.2 mg/dL (ref 8.7–10.2)
Chloride: 98 mmol/L (ref 96–106)
Creatinine, Ser: 1.33 mg/dL — ABNORMAL HIGH (ref 0.57–1.00)
Glucose: 132 mg/dL — ABNORMAL HIGH (ref 65–99)
Potassium: 4.6 mmol/L (ref 3.5–5.2)
Sodium: 142 mmol/L (ref 134–144)
eGFR: 47 mL/min/{1.73_m2} — ABNORMAL LOW (ref >59)

## 2021-01-12 LAB — HEMOGLOBIN A1C
Est. average glucose Bld gHb Est-mCnc: 166 mg/dL
Hgb A1c MFr Bld: 7.4 % — ABNORMAL HIGH (ref 4.8–5.6)

## 2021-01-14 ENCOUNTER — Other Ambulatory Visit: Payer: Self-pay

## 2021-01-14 MED ORDER — POTASSIUM CHLORIDE CRYS ER 10 MEQ PO TBCR: EXTENDED_RELEASE_TABLET | ORAL | 3 refills | Status: DC

## 2021-01-16 NOTE — Addendum Note (Signed)
Addended by: Eual Fines on: 01/16/2021 02:52 PM   Modules accepted: Orders

## 2021-01-22 ENCOUNTER — Encounter: Payer: Self-pay | Admitting: Internal Medicine

## 2021-01-24 ENCOUNTER — Other Ambulatory Visit: Payer: Self-pay

## 2021-01-24 ENCOUNTER — Other Ambulatory Visit (HOSPITAL_COMMUNITY)
Admission: RE | Admit: 2021-01-24 | Discharge: 2021-01-24 | Disposition: A | Discharge status: Home / Self Care | Payer: Medicare Other | Source: Ambulatory Visit | Attending: Internal Medicine | Admitting: Internal Medicine

## 2021-01-24 DIAGNOSIS — Z7984 Long term (current) use of oral hypoglycemic drugs: Secondary | ICD-10-CM | POA: Diagnosis not present

## 2021-01-24 DIAGNOSIS — K2950 Unspecified chronic gastritis without bleeding: Secondary | ICD-10-CM | POA: Diagnosis not present

## 2021-01-24 DIAGNOSIS — E669 Obesity, unspecified: Secondary | ICD-10-CM | POA: Diagnosis not present

## 2021-01-24 DIAGNOSIS — Z79899 Other long term (current) drug therapy: Secondary | ICD-10-CM | POA: Diagnosis not present

## 2021-01-24 DIAGNOSIS — Z01812 Encounter for preprocedural laboratory examination: Secondary | ICD-10-CM | POA: Diagnosis not present

## 2021-01-24 DIAGNOSIS — R1310 Dysphagia, unspecified: Secondary | ICD-10-CM | POA: Diagnosis present

## 2021-01-24 DIAGNOSIS — Z888 Allergy status to other drugs, medicaments and biological substances status: Secondary | ICD-10-CM | POA: Diagnosis not present

## 2021-01-24 DIAGNOSIS — Z96651 Presence of right artificial knee joint: Secondary | ICD-10-CM | POA: Diagnosis not present

## 2021-01-24 DIAGNOSIS — K222 Esophageal obstruction: Secondary | ICD-10-CM | POA: Diagnosis not present

## 2021-01-24 DIAGNOSIS — Z6837 Body mass index (BMI) 37.0-37.9, adult: Secondary | ICD-10-CM | POA: Diagnosis not present

## 2021-01-24 DIAGNOSIS — Z20822 Contact with and (suspected) exposure to covid-19: Secondary | ICD-10-CM | POA: Diagnosis not present

## 2021-01-24 LAB — BASIC METABOLIC PANEL
Anion gap: 12 (ref 5–15)
BUN: 27 mg/dL — ABNORMAL HIGH (ref 6–20)
CO2: 26 mmol/L (ref 22–32)
Calcium: 9.8 mg/dL (ref 8.9–10.3)
Chloride: 101 mmol/L (ref 98–111)
Creatinine, Ser: 1.22 mg/dL — ABNORMAL HIGH (ref 0.44–1.00)
GFR, Estimated: 52 mL/min — ABNORMAL LOW (ref >60)
Glucose, Bld: 147 mg/dL — ABNORMAL HIGH (ref 70–99)
Potassium: 4.2 mmol/L (ref 3.5–5.1)
Sodium: 139 mmol/L (ref 135–145)

## 2021-01-24 LAB — SARS CORONAVIRUS 2 (TAT 6-24 HRS): SARS Coronavirus 2: NEGATIVE

## 2021-01-25 ENCOUNTER — Ambulatory Visit (HOSPITAL_COMMUNITY): Payer: Medicare Other | Admitting: Certified Registered Nurse Anesthetist

## 2021-01-25 ENCOUNTER — Encounter (HOSPITAL_COMMUNITY)
Admission: RE | Disposition: A | Discharge status: Home / Self Care | Payer: Self-pay | Source: Home / Self Care | Attending: Internal Medicine

## 2021-01-25 ENCOUNTER — Encounter (HOSPITAL_COMMUNITY): Payer: Self-pay

## 2021-01-25 ENCOUNTER — Other Ambulatory Visit: Payer: Self-pay

## 2021-01-25 ENCOUNTER — Ambulatory Visit (HOSPITAL_COMMUNITY)
Admission: RE | Admit: 2021-01-25 | Discharge: 2021-01-25 | Disposition: A | Discharge status: Home / Self Care | Payer: Medicare Other | Attending: Internal Medicine | Admitting: Internal Medicine

## 2021-01-25 DIAGNOSIS — E119 Type 2 diabetes mellitus without complications: Secondary | ICD-10-CM | POA: Diagnosis not present

## 2021-01-25 DIAGNOSIS — R1310 Dysphagia, unspecified: Secondary | ICD-10-CM | POA: Diagnosis not present

## 2021-01-25 DIAGNOSIS — Z79899 Other long term (current) drug therapy: Secondary | ICD-10-CM | POA: Insufficient documentation

## 2021-01-25 DIAGNOSIS — K2970 Gastritis, unspecified, without bleeding: Secondary | ICD-10-CM | POA: Diagnosis not present

## 2021-01-25 DIAGNOSIS — K222 Esophageal obstruction: Secondary | ICD-10-CM | POA: Diagnosis not present

## 2021-01-25 DIAGNOSIS — Z96651 Presence of right artificial knee joint: Secondary | ICD-10-CM | POA: Diagnosis not present

## 2021-01-25 DIAGNOSIS — Z6837 Body mass index (BMI) 37.0-37.9, adult: Secondary | ICD-10-CM | POA: Insufficient documentation

## 2021-01-25 DIAGNOSIS — E669 Obesity, unspecified: Secondary | ICD-10-CM | POA: Insufficient documentation

## 2021-01-25 DIAGNOSIS — K2950 Unspecified chronic gastritis without bleeding: Secondary | ICD-10-CM | POA: Insufficient documentation

## 2021-01-25 DIAGNOSIS — Z888 Allergy status to other drugs, medicaments and biological substances status: Secondary | ICD-10-CM | POA: Diagnosis not present

## 2021-01-25 DIAGNOSIS — Z7984 Long term (current) use of oral hypoglycemic drugs: Secondary | ICD-10-CM | POA: Diagnosis not present

## 2021-01-25 HISTORY — PX: BIOPSY: SHX5522

## 2021-01-25 HISTORY — PX: BALLOON DILATION: SHX5330

## 2021-01-25 HISTORY — PX: ESOPHAGOGASTRODUODENOSCOPY (EGD) WITH PROPOFOL: SHX5813

## 2021-01-25 LAB — GLUCOSE, CAPILLARY: Glucose-Capillary: 174 mg/dL — ABNORMAL HIGH (ref 70–99)

## 2021-01-25 SURGERY — ESOPHAGOGASTRODUODENOSCOPY (EGD) WITH PROPOFOL
Anesthesia: General

## 2021-01-25 MED ORDER — PROPOFOL 10 MG/ML IV BOLUS
INTRAVENOUS | Status: DC | PRN
  Administered 2021-01-25: 100 mg via INTRAVENOUS

## 2021-01-25 MED ORDER — STERILE WATER FOR IRRIGATION IR SOLN
Status: DC | PRN
  Administered 2021-01-25: 100 mL

## 2021-01-25 MED ORDER — LACTATED RINGERS IV SOLN: INTRAVENOUS | Status: DC | PRN

## 2021-01-25 MED ORDER — LIDOCAINE HCL (CARDIAC) PF 100 MG/5ML IV SOSY
PREFILLED_SYRINGE | INTRAVENOUS | Status: DC | PRN
  Administered 2021-01-25: 50 mg via INTRAVENOUS

## 2021-01-25 MED ORDER — LACTATED RINGERS IV SOLN: INTRAVENOUS | Status: DC

## 2021-01-25 NOTE — Op Note (Signed)
[facility] Patient Name: Kelli Spencer Procedure Date: 01/25/2021 10:51 AM MRN: [id number] Date of Birth: Dec 21, 1965 Attending MD: Elon Alas. Abbey Chatters DO CSN: [id number] Age: 55 Admit Type: Outpatient Procedure:                Upper GI endoscopy Indications:              Dysphagia, Heartburn Providers:                Elon Alas. Abbey Chatters, DO, Lambert Mody, Dereck Leep, Technician Referring MD:              Medicines:                See the Anesthesia note for documentation of the                            administered medications Complications:            No immediate complications. Estimated Blood Loss:     Estimated blood loss was minimal. Procedure:                Pre-Anesthesia Assessment:                           - The anesthesia plan was to use monitored                            anesthesia care (MAC).                           After obtaining informed consent, the endoscope was                            passed under direct vision. Throughout the                            procedure, the patient's blood pressure, pulse, and                            oxygen saturations were monitored continuously. The                            GIF-H190 ([id number]) scope was introduced through the                            mouth, and advanced to the second part of duodenum.                            The upper GI endoscopy was accomplished without                            difficulty. The patient tolerated the procedure                            well. Scope In: 11:05:59 AM  Scope Out: 11:10:29 AM Total Procedure Duration: 0 hours 4 minutes 30 seconds  Findings:      The Z-line was regular and was found 38 cm from the incisors.      One benign-appearing, intrinsic mild stenosis was found in the lower       third of the esophagus. The stenosis was traversed. A TTS dilator was       passed through the scope. Dilation with an 18-19-20 mm balloon  dilator       was performed to 20 mm. The dilation site was examined and showed mild       mucosal disruption and moderate improvement in luminal narrowing.      Localized mild inflammation characterized by erythema was found in the       gastric antrum. Biopsies were taken with a cold forceps for Helicobacter       pylori testing.      The duodenal bulb, first portion of the duodenum and second portion of       the duodenum were normal. Impression:               - Z-line regular, 38 cm from the incisors.                           - Benign-appearing esophageal stenosis. Dilated.                           - Gastritis. Biopsied.                           - Normal duodenal bulb, first portion of the                            duodenum and second portion of the duodenum. Moderate Sedation:      Per Anesthesia Care Recommendation:           - Patient has a contact number available for                            emergencies. The signs and symptoms of potential                            delayed complications were discussed with the                            patient. Return to normal activities tomorrow.                            Written discharge instructions were provided to the                            patient.                           - Resume previous diet.                           - Continue present medications.                           -  Await pathology results.                           - Repeat upper endoscopy PRN for retreatment.                           - Return to GI clinic in 3 months. Procedure Code(s):        --- Professional ---                           [phone], Esophagogastroduodenoscopy, flexible,                            transoral; with transendoscopic balloon dilation of                            esophagus (less than 30 mm diameter)                           [id number], 59, Esophagogastroduodenoscopy, flexible,                            transoral; with biopsy, single  or multiple Diagnosis Code(s):        --- Professional ---                           K22.2, Esophageal obstruction                           K29.70, Gastritis, unspecified, without bleeding                           R13.10, Dysphagia, unspecified                           R12, Heartburn CPT copyright 2019 American Medical Association. All rights reserved. The codes documented in this report are preliminary and upon coder review may  be revised to meet current compliance requirements. Elon Alas. Abbey Chatters, DO [place] Abbey Chatters, DO 01/25/2021 11:13:04 AM This report has been signed electronically. Number of Addenda: 0

## 2021-01-25 NOTE — Transfer of Care (Signed)
Immediate Anesthesia Transfer of Care Note  Patient: Kelli Spencer  Procedure(s) Performed: ESOPHAGOGASTRODUODENOSCOPY (EGD) WITH PROPOFOL (N/A ) BALLOON DILATION (N/A )  Patient Location: PACU  Anesthesia Type:General  Level of Consciousness: awake, alert  and oriented  Airway & Oxygen Therapy: Patient Spontanous Breathing  Post-op Assessment: Report given to RN and Post -op Vital signs reviewed and stable  Post vital signs: Reviewed and stable  Last Vitals:  Vitals Value Taken Time  BP    Temp    Pulse    Resp    SpO2      Last Pain:  Vitals:   01/25/21 1102  TempSrc:   PainSc: 0-No pain      Patients Stated Pain Goal: 8 (65/78/46 9629)  Complications: No complications documented.

## 2021-01-25 NOTE — Anesthesia Postprocedure Evaluation (Signed)
Anesthesia Post Note  Patient: Kelli Spencer  Procedure(s) Performed: ESOPHAGOGASTRODUODENOSCOPY (EGD) WITH PROPOFOL (N/A ) BALLOON DILATION (N/A )  Patient location during evaluation: Phase II Anesthesia Type: General Level of consciousness: awake and alert Pain management: satisfactory to patient Vital Signs Assessment: post-procedure vital signs reviewed and stable Respiratory status: spontaneous breathing and respiratory function stable Cardiovascular status: blood pressure returned to baseline and stable Postop Assessment: no apparent nausea or vomiting Anesthetic complications: no   No complications documented.   Last Vitals:  Vitals:   01/25/21 0958  BP: 120/68  Pulse: 90  Resp: 13  Temp: 36.7 C  SpO2: 100%    Last Pain:  Vitals:   01/25/21 1102  TempSrc:   PainSc: 0-No pain                 Karna Dupes

## 2021-01-25 NOTE — H&P (Signed)
Primary Care Physician:  Fayrene Helper, MD Primary Gastroenterologist:  Dr. Abbey Chatters  Pre-Procedure History & Physical: HPI:  Kelli Spencer is a 55 y.o. female is here for an EGD for GERD and dysphagia. Several weeks of nocturnal reflux symptoms. Burning in throat area and feels lot of pressure especially when lays down. Having some solid food and pill dysphagia. Feels like before she had her esophagus stretched. Esophageal dilation helped a lot in 2019.  Waits at least 6 to 7 hours between her 3 PM meal and laying down at night.  When she wakes up with reflux, she tries over-the-counter chewable antacids, but no relief.  Scared to eat because of the discomfort.  Sister gave her some of her Dexilant, she took it for 2 to 3 weeks and initially seem to be helping but over the past week lost effects.  Previously failed Nexium and Prilosec.  Denies abdominal pain. BM regular. No melena, brbpr.    Past Medical History:  Diagnosis Date  . Arthritis   . Asthma   . Diabetes mellitus without complication ([place])   . Diastolic hypertension   . GERD (gastroesophageal reflux disease)   . Hyperlipidemia   . Hypertension   . Migraines   . Obesity   . Seizures ([place])    pt states she has seizures only when she gets upset. pt is taking no seizure meds    Past Surgical History:  Procedure Laterality Date  . ABDOMINAL HYSTERECTOMY  1997   fibroids  . COLONOSCOPY N/A 06/23/2016   Dr. Oneida Alar: prep not adequate to detect polyps less than 55mm, redundant left colon. hemorrhoids. next TCS in 5 years.   . ESOPHAGOGASTRODUODENOSCOPY N/A 04/02/2018   Dr. Oneida Alar: Benign-appearing esophageal stricture status post dilation, multiple benign gastric polyps, mild gastritis no H. pylori.  Marland Kitchen KNEE ARTHROSCOPY WITH MEDIAL MENISECTOMY Left 02/09/2014   Procedure: KNEE ARTHROSCOPY WITH MEDIAL MENISECTOMY;  Surgeon: Carole Civil, MD;  Location: [facility];  Service: Orthopedics;  Laterality: Left;  . PARTIAL HYSTERECTOMY     . SAVORY DILATION N/A 04/02/2018   Procedure: SAVORY DILATION;  Surgeon: Danie Binder, MD;  Location: [facility];  Service: Endoscopy;  Laterality: N/A;  . TOTAL KNEE ARTHROPLASTY Right 03/18/2017   Procedure: TOTAL KNEE ARTHROPLASTY;  Surgeon: Carole Civil, MD;  Location: [facility];  Service: Orthopedics;  Laterality: Right;    Prior to Admission medications   Medication Sig Start Date End Date Taking? Authorizing Provider  allopurinol (ZYLOPRIM) 300 MG tablet TAKE 1 TABLET BY MOUTH  DAILY Patient taking differently: Take 300 mg by mouth daily. 10/22/20  Yes Fayrene Helper, MD  aspirin-sod bicarb-citric acid (ALKA-SELTZER) 325 MG TBEF tablet Take 650 mg by mouth at bedtime as needed (indigestion).   Yes [provider]  cetirizine (ZYRTEC) 10 MG tablet Take 10 mg by mouth at bedtime as needed for allergies.   Yes [provider]  cholecalciferol (VITAMIN D) 400 UNITS TABS tablet Take 800 Units by mouth daily.   Yes [provider]  glipiZIDE (GLUCOTROL) 10 MG tablet TAKE 1 TABLET BY MOUTH  TWICE DAILY BEFORE MEALS Patient taking differently: Take 10 mg by mouth 2 (two) times daily before a meal. 10/29/20  Yes Fayrene Helper, MD  hydrochlorothiazide (MICROZIDE) 12.5 MG capsule TAKE 1 CAPSULE BY MOUTH  DAILY Patient taking differently: Take 12.5 mg by mouth daily. 10/29/20  Yes Fayrene Helper, MD  hydrOXYzine (ATARAX/VISTARIL) 50 MG tablet TAKE 1 TABLET BY MOUTH  AT  BEDTIME FOR ITCHING OR  ANXIETY Patient taking differently: Take 50 mg by mouth at bedtime as needed for itching. 01/14/21  Yes Fayrene Helper, MD  linagliptin (TRADJENTA) 5 MG TABS tablet TAKE 1 TABLET(5 MG) BY MOUTH DAILY Patient taking differently: Take 5 mg by mouth daily. 03/01/20  Yes Fayrene Helper, MD  lubiprostone (AMITIZA) 24 MCG capsule TAKE 1 CAPSULE(24 MCG) BY MOUTH TWICE DAILY WITH A MEAL Patient taking differently: Take 24 mcg by mouth 2 (two) times daily with  a meal. 05/20/20  Yes Jodi Mourning, Kristen S, PA-C  metFORMIN (GLUCOPHAGE) 500 MG tablet TAKE 1 TABLET BY MOUTH  TWICE DAILY WITH MEALS Patient taking differently: Take 500 mg by mouth 2 (two) times daily with a meal. 10/29/20  Yes Fayrene Helper, MD  pravastatin (PRAVACHOL) 10 MG tablet TAKE 1 TABLET(10 MG) BY MOUTH DAILY Patient taking differently: Take 10 mg by mouth daily. 08/22/20  Yes Fayrene Helper, MD  RABEprazole (ACIPHEX) 20 MG tablet Take 1 tablet (20 mg total) by mouth 2 (two) times daily before a meal. 12/25/20  Yes Mahala Menghini, PA-C  sucralfate (CARAFATE) 1 GM/10ML suspension SHAKE LIQUID AND TAKE 10 ML(1 GRAM) BY MOUTH AT BEDTIME Patient taking differently: Take 1 g by mouth at bedtime. 12/26/20  Yes Carlis Stable, NP  Accu-Chek Softclix Lancets lancets USE TO TEST blood sugar twice daily  Dx E11.65 01/13/19   Fayrene Helper, MD  acetaminophen (TYLENOL) 500 MG tablet Take 500-1,000 mg by mouth every 6 (six) hours as needed for moderate pain.    [provider]  blood glucose meter kit and supplies Dispense based on patient and insurance preference. Once daily testing dx e11.9 11/08/19   Fayrene Helper, MD  clotrimazole-betamethasone (LOTRISONE) cream Apply cream twice daily to rash for 1 week, then as needed Patient taking differently: Apply 1 application topically daily as needed (rash). 06/01/18   Fayrene Helper, MD  glucose blood (ACCU-CHEK AVIVA PLUS) test strip USE TO TEST BLOOD SUGAR twice daily DX E11.65 10/23/20   Fayrene Helper, MD  potassium chloride (KLOR-CON) 10 MEQ tablet TAKE 1 TABLET(10 MEQ) BY MOUTH DAILY Patient not taking: No sig reported 01/14/21   Fayrene Helper, MD  tiZANidine (ZANAFLEX) 4 MG capsule Take 1 capsule (4 mg total) by mouth at bedtime as needed for muscle spasms. 06/05/20   Fayrene Helper, MD    Allergies as of 12/25/2020 - Review Complete 12/25/2020  Allergen Reaction Noted  . Prednisone Swelling and Other  (See Comments) 06/09/2013  . Amlodipine Itching and Swelling 11/05/2017  . Ace inhibitors Cough 07/04/2016  . Metformin and related Diarrhea 05/05/2016    Family History  Problem Relation Age of Onset  . Kidney failure Mother   . Diabetes Mother   . Hypertension Mother   . Stroke Mother   . Diabetes Father   . Heart disease Father   . Hypertension Sister   . Colon cancer Neg Hx     Social History   Socioeconomic History  . Marital status: Divorced    Spouse name: Not on file  . Number of children: 3  . Years of education: Not on file  . Highest education level: 9th grade  Occupational History  . Occupation: disabled  Tobacco Use  . Smoking status: Never Smoker  . Smokeless tobacco: Never Used  Vaping Use  . Vaping Use: Never used  Substance and Sexual Activity  . Alcohol use: No  .  Drug use: No  . Sexual activity: Not Currently  Other Topics Concern  . Not on file  Social History Narrative   Daughter currently living with her until she can get her own place.    Social Determinants of Health   Financial Resource Strain: Low Risk   . Difficulty of Paying Living Expenses: Not hard at all  Food Insecurity: No Food Insecurity  . Worried About Charity fundraiser in the Last Year: Never true  . Ran Out of Food in the Last Year: Never true  Transportation Needs: No Transportation Needs  . Lack of Transportation (Medical): No  . Lack of Transportation (Non-Medical): No  Physical Activity: Insufficiently Active  . Days of Exercise per Week: 4 days  . Minutes of Exercise per Session: 30 min  Stress: No Stress Concern Present  . Feeling of Stress : Not at all  Social Connections: Moderately Isolated  . Frequency of Communication with Friends and Family: Three times a week  . Frequency of Social Gatherings with Friends and Family: Twice a week  . Attends Religious Services: More than 4 times per year  . Active Member of Clubs or Organizations: No  . Attends Theatre manager Meetings: Never  . Marital Status: Divorced  Human resources officer Violence: Not At Risk  . Fear of Current or Ex-Partner: No  . Emotionally Abused: No  . Physically Abused: No  . Sexually Abused: No    Review of Systems: See HPI, otherwise negative ROS  Physical Exam: Vital signs in last 24 hours: Temp:  [98.1 F (36.7 C)] 98.1 F (36.7 C) (03/25 0958) Pulse Rate:  [90] 90 (03/25 0958) Resp:  [13] 13 (03/25 0958) BP: (120)/(68) 120/68 (03/25 0958) SpO2:  [100 %] 100 % (03/25 0958) Weight:  [86.6 kg] 86.6 kg (03/25 0958)   General:   Alert,  Well-developed, well-nourished, pleasant and cooperative in NAD Head:  Normocephalic and atraumatic. Eyes:  Sclera clear, no icterus.   Conjunctiva pink. Ears:  Normal auditory acuity. Nose:  No deformity, discharge,  or lesions. Mouth:  No deformity or lesions, dentition normal. Neck:  Supple; no masses or thyromegaly. Lungs:  Clear throughout to auscultation.   No wheezes, crackles, or rhonchi. No acute distress. Heart:  Regular rate and rhythm; no murmurs, clicks, rubs,  or gallops. Abdomen:  Soft, nontender and nondistended. No masses, hepatosplenomegaly or hernias noted. Normal bowel sounds, without guarding, and without rebound.   Msk:  Symmetrical without gross deformities. Normal posture. Extremities:  Without clubbing or edema. Neurologic:  Alert and  oriented x4;  grossly normal neurologically. Skin:  Intact without significant lesions or rashes. Cervical Nodes:  No significant cervical adenopathy. Psych:  Alert and cooperative. Normal mood and affect.  Impression/Plan: Kelli Spencer is here for an EGD for GERD and dysphagia.   The risks of the procedure including infection, bleed, or perforation as well as benefits, limitations, alternatives and imponderables have been reviewed with the patient. Questions have been answered. All parties agreeable.

## 2021-01-25 NOTE — Discharge Instructions (Addendum)
EGD Discharge instructions Please read the instructions outlined below and refer to this sheet in the next few weeks. These discharge instructions provide you with general information on caring for yourself after you leave the hospital. Your doctor may also give you specific instructions. While your treatment has been planned according to the most current medical practices available, unavoidable complications occasionally occur. If you have any problems or questions after discharge, please call your doctor. ACTIVITY  You may resume your regular activity but move at a slower pace for the next 24 hours.   Take frequent rest periods for the next 24 hours.   Walking will help expel (get rid of) the air and reduce the bloated feeling in your abdomen.   No driving for 24 hours (because of the anesthesia (medicine) used during the test).   You may shower.   Do not sign any important legal documents or operate any machinery for 24 hours (because of the anesthesia used during the test).  NUTRITION  Drink plenty of fluids.   You may resume your normal diet.   Begin with a light meal and progress to your normal diet.   Avoid alcoholic beverages for 24 hours or as instructed by your caregiver.  MEDICATIONS  You may resume your normal medications unless your caregiver tells you otherwise.  WHAT YOU CAN EXPECT TODAY  You may experience abdominal discomfort such as a feeling of fullness or "gas" pains.  FOLLOW-UP  Your doctor will discuss the results of your test with you.  SEEK IMMEDIATE MEDICAL ATTENTION IF ANY OF THE FOLLOWING OCCUR:  Excessive nausea (feeling sick to your stomach) and/or vomiting.   Severe abdominal pain and distention (swelling).   Trouble swallowing.   Temperature over 101 F (37.8 C).   Rectal bleeding or vomiting of blood.   Your EGD showed a mild amount inflammation in your stomach.  I took biopsies of this to rule out infection with a bacteria called H.  pylori.  Await pathology results, my office will contact you.  Continue on AcipHex twice daily.  Your esophagus did show slight narrowing so I stretch this with the balloon.  Hopefully this helps with your swallowing.  Follow-up with GI in 3 months.   I hope you have a great rest of your week!  Elon Alas. Abbey Chatters, D.O. Gastroenterology and Hepatology [facility]   Gastritis, Adult  Gastritis is swelling (inflammation) of the stomach. Gastritis can develop quickly (acute). It can also develop slowly over time (chronic). It is important to get help for this condition. If you do not get help, your stomach can bleed, and you can get sores (ulcers) in your stomach. What are the causes? This condition may be caused by:  Germs that get to your stomach.  Drinking too much alcohol.  Medicines you are taking.  Too much acid in the stomach.  A disease of the intestines or stomach.  Stress.  An allergic reaction.  Crohn's disease.  Some cancer treatments (radiation). Sometimes the cause of this condition is not known. What are the signs or symptoms? Symptoms of this condition include:  Pain in your stomach.  A burning feeling in your stomach.  Feeling sick to your stomach (nauseous).  Throwing up (vomiting).  Feeling too full after you eat.  Weight loss.  Bad breath.  Throwing up blood.  Blood in your poop (stool). How is this diagnosed? This condition may be diagnosed with:  Your medical history and symptoms.  A physical exam.  Tests. These can include: ? Blood tests. ? Stool tests. ? A procedure to look inside your stomach (upper endoscopy). ? A test in which a sample of tissue is taken for testing (biopsy). How is this treated? Treatment for this condition depends on what caused it. You may be given:  Antibiotic medicine, if your condition was caused by germs.  H2 blockers and similar medicines, if your condition was caused by  too much acid. Follow these instructions at home: Medicines  Take over-the-counter and prescription medicines only as told by your doctor.  If you were prescribed an antibiotic medicine, take it as told by your doctor. Do not stop taking it even if you start to feel better. Eating and drinking  Eat small meals often, instead of large meals.  Avoid foods and drinks that make your symptoms worse.  Drink enough fluid to keep your pee (urine) pale yellow.   Alcohol use  Do not drink alcohol if: ? Your doctor tells you not to drink. ? You are pregnant, may be pregnant, or are planning to become pregnant.  If you drink alcohol: ? Limit your use to:  0-1 drink a day for women.  0-2 drinks a day for men. ? Be aware of how much alcohol is in your drink. In the [place] one drink equals one 12 oz bottle of beer (355 mL), one 5 oz glass of wine (148 mL), or one 1 oz glass of hard liquor (44 mL). General instructions  Talk with your doctor about ways to manage stress. You can exercise or do deep breathing, meditation, or yoga.  Do not smoke or use products that have nicotine or tobacco. If you need help quitting, ask your doctor.  Keep all follow-up visits as told by your doctor. This is important. Contact a doctor if:  Your symptoms get worse.  Your symptoms go away and then come back. Get help right away if:  You throw up blood or something that looks like coffee grounds.  You have black or dark red poop.  You throw up any time you try to drink fluids.  Your stomach pain gets worse.  You have a fever.  You do not feel better after one week. Summary  Gastritis is swelling (inflammation) of the stomach.  You must get help for this condition. If you do not get help, your stomach can bleed, and you can get sores (ulcers).  This condition is diagnosed with medical history, physical exam, or tests.  You can be treated with medicines for germs or medicines to block too much  acid in your stomach. This information is not intended to replace advice given to you by your health care provider. Make sure you discuss any questions you have with your health care provider. Document Revised: 03/09/2018 Document Reviewed: 03/09/2018 Elsevier Patient Education  [place].

## 2021-01-25 NOTE — Anesthesia Preprocedure Evaluation (Signed)
Anesthesia Evaluation  Patient identified by MRN, date of birth, ID band Patient awake    Reviewed: Allergy & Precautions, H&P , NPO status , Patient's Chart, lab work & pertinent test results, reviewed documented beta blocker date and time   Airway Mallampati: II  TM Distance: >3 FB Neck ROM: full    Dental no notable dental hx.    Pulmonary neg pulmonary ROS, asthma ,    Pulmonary exam normal breath sounds clear to auscultation       Cardiovascular Exercise Tolerance: Good hypertension, negative cardio ROS   Rhythm:regular Rate:Normal     Neuro/Psych  Headaches, Seizures -, Well Controlled,  negative neurological ROS  negative psych ROS   GI/Hepatic negative GI ROS, Neg liver ROS, GERD  Medicated,  Endo/Other  negative endocrine ROSdiabetes  Renal/GU CRFRenal diseasenegative Renal ROS  negative genitourinary   Musculoskeletal negative musculoskeletal ROS (+)   Abdominal   Peds negative pediatric ROS (+)  Hematology negative hematology ROS (+)   Anesthesia Other Findings   Reproductive/Obstetrics negative OB ROS                             Anesthesia Physical Anesthesia Plan  ASA: III  Anesthesia Plan: General   Post-op Pain Management:    Induction:   PONV Risk Score and Plan: Propofol infusion  Airway Management Planned:   Additional Equipment:   Intra-op Plan:   Post-operative Plan:   Informed Consent: I have reviewed the patients History and Physical, chart, labs and discussed the procedure including the risks, benefits and alternatives for the proposed anesthesia with the patient or authorized representative who has indicated his/her understanding and acceptance.     Dental Advisory Given  Plan Discussed with: CRNA  Anesthesia Plan Comments:         Anesthesia Quick Evaluation

## 2021-01-28 LAB — SURGICAL PATHOLOGY

## 2021-01-31 ENCOUNTER — Telehealth: Payer: Self-pay

## 2021-01-31 NOTE — Telephone Encounter (Signed)
Result letter sent.

## 2021-01-31 NOTE — Progress Notes (Signed)
Dear Kelli Spencer,   Your biopsies showed inflammation in your stomach but it is negative for H. Pylori. Continue on your Aciphex. We hope the dilation has helped with your swallowing. Follow-up with GI as previously scheduled.  If you have any questions please feel free to call us.    Thank you, Oleh Genin

## 2021-02-01 ENCOUNTER — Encounter (HOSPITAL_COMMUNITY): Payer: Self-pay | Admitting: Internal Medicine

## 2021-02-04 ENCOUNTER — Other Ambulatory Visit: Payer: Self-pay | Admitting: Gastroenterology

## 2021-02-06 ENCOUNTER — Other Ambulatory Visit: Payer: Self-pay | Admitting: Gastroenterology

## 2021-02-21 ENCOUNTER — Other Ambulatory Visit: Payer: Self-pay | Admitting: Family Medicine

## 2021-02-21 DIAGNOSIS — E1169 Type 2 diabetes mellitus with other specified complication: Secondary | ICD-10-CM

## 2021-02-21 DIAGNOSIS — E669 Obesity, unspecified: Secondary | ICD-10-CM

## 2021-03-18 ENCOUNTER — Ambulatory Visit: Payer: Medicare Other | Admitting: Orthopedic Surgery

## 2021-03-20 ENCOUNTER — Other Ambulatory Visit: Payer: Self-pay | Admitting: Gastroenterology

## 2021-03-25 ENCOUNTER — Encounter: Payer: Self-pay | Admitting: Orthopedic Surgery

## 2021-03-25 ENCOUNTER — Ambulatory Visit (INDEPENDENT_AMBULATORY_CARE_PROVIDER_SITE_OTHER): Payer: Medicare Other | Admitting: Orthopedic Surgery

## 2021-03-25 ENCOUNTER — Ambulatory Visit: Payer: Medicare Other

## 2021-03-25 ENCOUNTER — Other Ambulatory Visit: Payer: Self-pay

## 2021-03-25 VITALS — BP 126/89 | HR 91 | Ht 60.0 in | Wt 194.0 lb

## 2021-03-25 DIAGNOSIS — M1711 Unilateral primary osteoarthritis, right knee: Secondary | ICD-10-CM | POA: Diagnosis not present

## 2021-03-25 DIAGNOSIS — Z96651 Presence of right artificial knee joint: Secondary | ICD-10-CM

## 2021-03-25 NOTE — Progress Notes (Signed)
Chief Complaint  Patient presents with  . Knee Pain    Right knee pain, DOS 03/18/2017.     1 year follow-up on a 3-year total knee  Sigma fixed-bearing posterior stabilized knee  X-rays look good with some patellar tilt  She does complain of some intermittent swelling when she is on the knee for long period of time but it goes down  She hyperextends a little bit there is a little flexion gap laxity but nothing serious  Collateral ligaments feel stable  Recommend yearly follow-up in case anything shows up on x-ray to explain the swelling

## 2021-04-08 DIAGNOSIS — E083293 Diabetes mellitus due to underlying condition with mild nonproliferative diabetic retinopathy without macular edema, bilateral: Secondary | ICD-10-CM | POA: Diagnosis not present

## 2021-04-08 LAB — HM DIABETES EYE EXAM

## 2021-04-30 ENCOUNTER — Ambulatory Visit: Payer: Medicare Other | Admitting: Gastroenterology

## 2021-05-12 ENCOUNTER — Other Ambulatory Visit: Payer: Self-pay | Admitting: Family Medicine

## 2021-05-12 DIAGNOSIS — E785 Hyperlipidemia, unspecified: Secondary | ICD-10-CM

## 2021-05-30 ENCOUNTER — Other Ambulatory Visit: Payer: Self-pay

## 2021-05-30 ENCOUNTER — Ambulatory Visit (HOSPITAL_COMMUNITY)
Admission: RE | Admit: 2021-05-30 | Discharge: 2021-05-30 | Disposition: A | Discharge status: Home / Self Care | Payer: Medicare Other | Source: Ambulatory Visit | Attending: Family Medicine | Admitting: Family Medicine

## 2021-05-30 DIAGNOSIS — Z1231 Encounter for screening mammogram for malignant neoplasm of breast: Secondary | ICD-10-CM | POA: Insufficient documentation

## 2021-06-10 ENCOUNTER — Telehealth: Payer: Self-pay

## 2021-06-10 NOTE — Telephone Encounter (Signed)
Received fax from [facility], pt has rx on file for Rabeprazole and Pantoprazole. Need to verify which rx pt has discontinued.  Pantoprazole was discontinued. Noted on fax and returned to [facility].

## 2021-06-11 ENCOUNTER — Other Ambulatory Visit: Payer: Self-pay

## 2021-06-11 DIAGNOSIS — E785 Hyperlipidemia, unspecified: Secondary | ICD-10-CM | POA: Diagnosis not present

## 2021-06-11 DIAGNOSIS — E1159 Type 2 diabetes mellitus with other circulatory complications: Secondary | ICD-10-CM | POA: Diagnosis not present

## 2021-06-12 ENCOUNTER — Other Ambulatory Visit: Payer: Self-pay | Admitting: Gastroenterology

## 2021-06-12 ENCOUNTER — Ambulatory Visit: Payer: Medicare Other | Admitting: Family Medicine

## 2021-06-12 DIAGNOSIS — K219 Gastro-esophageal reflux disease without esophagitis: Secondary | ICD-10-CM

## 2021-06-12 LAB — LIPID PANEL
Chol/HDL Ratio: 3.2 ratio (ref 0.0–4.4)
Cholesterol, Total: 142 mg/dL (ref 100–199)
HDL: 45 mg/dL (ref >39)
LDL Chol Calc (NIH): 82 mg/dL (ref 0–99)
Triglycerides: 79 mg/dL (ref 0–149)
VLDL Cholesterol Cal: 15 mg/dL (ref 5–40)

## 2021-06-12 LAB — CMP14+EGFR
ALT: 16 IU/L (ref 0–32)
AST: 13 IU/L (ref 0–40)
Albumin/Globulin Ratio: 1.5 (ref 1.2–2.2)
Albumin: 4.7 g/dL (ref 3.8–4.9)
Alkaline Phosphatase: 113 IU/L (ref 44–121)
BUN/Creatinine Ratio: 25 — ABNORMAL HIGH (ref 9–23)
BUN: 27 mg/dL — ABNORMAL HIGH (ref 6–24)
Bilirubin Total: 0.3 mg/dL (ref 0.0–1.2)
CO2: 24 mmol/L (ref 20–29)
Calcium: 10.5 mg/dL — ABNORMAL HIGH (ref 8.7–10.2)
Chloride: 99 mmol/L (ref 96–106)
Creatinine, Ser: 1.06 mg/dL — ABNORMAL HIGH (ref 0.57–1.00)
Globulin, Total: 3.1 g/dL (ref 1.5–4.5)
Glucose: 154 mg/dL — ABNORMAL HIGH (ref 65–99)
Potassium: 4.9 mmol/L (ref 3.5–5.2)
Sodium: 141 mmol/L (ref 134–144)
Total Protein: 7.8 g/dL (ref 6.0–8.5)
eGFR: 62 mL/min/{1.73_m2} (ref >59)

## 2021-06-12 LAB — HEMOGLOBIN A1C
Est. average glucose Bld gHb Est-mCnc: 180 mg/dL
Hgb A1c MFr Bld: 7.9 % — ABNORMAL HIGH (ref 4.8–5.6)

## 2021-06-12 MED ORDER — RABEPRAZOLE SODIUM 20 MG PO TBEC
DELAYED_RELEASE_TABLET | ORAL | 5 refills | Status: DC
Start: 2021-06-12 — End: 2022-02-18

## 2021-06-12 NOTE — Telephone Encounter (Signed)
Rx sent 

## 2021-06-12 NOTE — Telephone Encounter (Signed)
Tried to call pt, LMOVM to inform her rx was sent.

## 2021-06-12 NOTE — Telephone Encounter (Signed)
Pt called yesterday, she said [facility] has been having hard time getting rx for Rabeprazole and she was almost out. Last rx was sent to [facility] but she is now wanting to get med from [facility].

## 2021-06-12 NOTE — Telephone Encounter (Signed)
Pt called office, informed her rx was sent.

## 2021-06-14 ENCOUNTER — Other Ambulatory Visit: Payer: Self-pay | Admitting: Gastroenterology

## 2021-06-14 DIAGNOSIS — K219 Gastro-esophageal reflux disease without esophagitis: Secondary | ICD-10-CM

## 2021-06-15 ENCOUNTER — Other Ambulatory Visit: Payer: Self-pay | Admitting: Family Medicine

## 2021-06-15 DIAGNOSIS — E1121 Type 2 diabetes mellitus with diabetic nephropathy: Secondary | ICD-10-CM

## 2021-06-15 DIAGNOSIS — IMO0002 Reserved for concepts with insufficient information to code with codable children: Secondary | ICD-10-CM

## 2021-06-19 ENCOUNTER — Other Ambulatory Visit: Payer: Self-pay

## 2021-06-19 ENCOUNTER — Ambulatory Visit (INDEPENDENT_AMBULATORY_CARE_PROVIDER_SITE_OTHER): Payer: Medicare Other | Admitting: Family Medicine

## 2021-06-19 ENCOUNTER — Other Ambulatory Visit (HOSPITAL_COMMUNITY)
Admission: RE | Admit: 2021-06-19 | Discharge: 2021-06-19 | Disposition: A | Discharge status: Home / Self Care | Payer: Medicare Other | Source: Ambulatory Visit | Attending: Family Medicine | Admitting: Family Medicine

## 2021-06-19 VITALS — BP 127/86 | HR 90 | Temp 98.9°F | Ht 60.0 in | Wt 192.1 lb

## 2021-06-19 DIAGNOSIS — E1159 Type 2 diabetes mellitus with other circulatory complications: Secondary | ICD-10-CM | POA: Diagnosis not present

## 2021-06-19 DIAGNOSIS — F322 Major depressive disorder, single episode, severe without psychotic features: Secondary | ICD-10-CM | POA: Diagnosis not present

## 2021-06-19 DIAGNOSIS — Z01419 Encounter for gynecological examination (general) (routine) without abnormal findings: Secondary | ICD-10-CM | POA: Diagnosis present

## 2021-06-19 DIAGNOSIS — Z124 Encounter for screening for malignant neoplasm of cervix: Secondary | ICD-10-CM | POA: Insufficient documentation

## 2021-06-19 DIAGNOSIS — I10 Essential (primary) hypertension: Secondary | ICD-10-CM | POA: Diagnosis not present

## 2021-06-19 DIAGNOSIS — E785 Hyperlipidemia, unspecified: Secondary | ICD-10-CM

## 2021-06-19 DIAGNOSIS — Z1151 Encounter for screening for human papillomavirus (HPV): Secondary | ICD-10-CM | POA: Insufficient documentation

## 2021-06-19 MED ORDER — METFORMIN HCL 500 MG PO TABS: ORAL_TABLET | ORAL | 3 refills | Status: DC

## 2021-06-19 MED ORDER — ESCITALOPRAM OXALATE 10 MG PO TABS: 10.0000 mg | ORAL_TABLET | Freq: Every day | ORAL | 3 refills | Status: DC

## 2021-06-19 NOTE — Patient Instructions (Signed)
F/U in 6 weeks, re evaluate depression and blood sugar, call if you need me sooner    NEW higher dose of metformin, increase to one tablet  3 times daily with meals   New for depression Lexapro 10 mg daily and you are referred to therapist , Elmyra Ricks, she will call you, this is at your LOCAL pharmacy, since we are just starting this  Pap sent  It is important that you exercise regularly at least 30 minutes 5 times a week. If you develop chest pain, have severe difficulty breathing, or feel very tired, stop exercising immediately and seek medical attention  Think about what you will eat, plan ahead. Choose " clean, green, fresh or frozen" over canned, processed or packaged foods which are more sugary, salty and fatty. 70 to 75% of food eaten should be vegetables and fruit. Three meals at set times with snacks allowed between meals, but they must be fruit or vegetables. Aim to eat over a 12 hour period , example 7 am to 7 pm, and STOP after  your last meal of the day. Drink water,generally about 64 ounces per day, no other drink is as healthy. Fruit juice is best enjoyed in a healthy way, by EATING the fruit. Thanks for choosing [facility], we consider it a privelige to serve you.

## 2021-06-19 NOTE — Progress Notes (Signed)
Kelli Spencer     MRN: [id number]      DOB: 09/24/1966   HPI Kelli Spencer is here for follow up and re-evaluation of chronic medical conditions, medication management and review of any available recent lab and radiology data.  Preventive health is updated, specifically  Cancer screening and Immunization.   Questions or concerns regarding consultations or procedures which the PT has had in the interim are  addressed. The PT denies any adverse reactions to current medications since the last visit.  There are no new concerns.  There are no specific complaints   ROS Denies recent fever or chills. Denies sinus pressure, nasal congestion, ear pain or sore throat. Denies chest congestion, productive cough or wheezing. Denies chest pains, palpitations and leg swelling Denies abdominal pain, nausea, vomiting,diarrhea or constipation.   Denies dysuria, frequency, hesitancy or incontinence. Chronic  joint pain,  and limitation in mobility. Denies headaches, seizures, numbness, or tingling. C/o depression and anxiery stressed because of ill health of her daughter who lives with her. Denies skin break down or rash.   PE  BP 127/86   Pulse 90   Temp 98.9 F (37.2 C)   Ht 5' (1.524 m)   Wt 192 lb 1.3 oz (87.1 kg)   LMP 06/10/1988 (Approximate)   SpO2 93%   BMI 37.51 kg/m   Patient alert and oriented and in no cardiopulmonary distress.  HEENT: No facial asymmetry, EOMI,     Neck supple .  Chest: Clear to auscultation bilaterally.  CVS: S1, S2 no murmurs, no S3.Regular rate.  ABD: Soft non tender.   Pelvic: normal external female genitalia, no inguinal adenopathy rash Vaginal walls pink and moist , physiologic d/c , cervix and uterus absent, o adnexal masses or tenderness Good pelvic support of bladder and rectum Ext: No edema  MS: Adequate ROM spine, shoulders, hips and knees.  Skin: Intact, no ulcerations or rash noted.  Psych: Good eye contact, normal affect. Memory intact  not anxious or depressed appearing.  CNS: CN 2-12 intact, power,  normal throughout.no focal deficits noted.   Assessment & Plan  Depression, major, single episode, severe ([place]) Refer therapist and start lexapro, re eval in 6 to 8 weeks  Type 2 diabetes mellitus with vascular disease ([place]) Deteriorated , [place] metformin  Kelli Spencer is reminded of the importance of commitment to daily physical activity for 30 minutes or more, as able and the need to limit carbohydrate intake to 30 to 60 grams per meal to help with blood sugar control.   The need to take medication as prescribed, test blood sugar as directed, and to call between visits if there is a concern that blood sugar is uncontrolled is also discussed.   Kelli Spencer is reminded of the importance of daily foot exam, annual eye examination, and good blood sugar, blood pressure and cholesterol control.  Diabetic Labs Latest Ref Rng & Units 06/11/2021 01/24/2021 01/10/2021 09/05/2020 05/17/2020  HbA1c 4.8 - 5.6 % 7.9(H) - 7.4(H) 7.3(H) 6.9(A)  Microalbumin mg/dL - - - - -  Micro/Creat Ratio 0 - 29 mg/g creat - - 5 - -  Chol 100 - 199 mg/dL 142 - - 125 -  HDL >39 mg/dL 45 - - 44(L) -  Calc LDL 0 - 99 mg/dL 82 - - 66 -  Triglycerides 0 - 149 mg/dL 79 - - 72 -  Creatinine 0.57 - 1.00 mg/dL 1.06(H) 1.22(H) 1.33(H) 1.11(H) -   BP/Weight 06/19/2021 03/25/2021 01/25/2021 01/09/2021 12/25/2020  09/11/2020 [id number]  Systolic BP [id number] - [id number] A999333 [id number]  Diastolic BP 86 89 71 - 80 82 77  Wt. (Lbs) 192.08 194 191 191 195.2 190 190  BMI 37.51 37.89 37.3 37.3 38.12 37.11 37.11   Foot/eye exam completion dates Latest Ref Rng & Units 04/08/2021 09/11/2020  Eye Exam No Retinopathy Retinopathy(A) -  Foot Form Completion - - Done        Morbid obesity ([place])  Patient re-educated about  the importance of commitment to a  minimum of 150 minutes of exercise per week as able.  The importance of healthy food choices with portion control discussed, as well as  eating regularly and within a 12 hour window most days. The need to choose "clean , green" food 50 to 75% of the time is discussed, as well as to make water the primary drink and set a goal of 64 ounces water daily.    Weight /BMI 06/19/2021 03/25/2021 01/25/2021  WEIGHT 192 lb 1.3 oz 194 lb 191 lb  HEIGHT '5\' 0"'$  '5\' 0"'$  '5\' 0"'$   BMI 37.51 kg/m2 37.89 kg/m2 37.3 kg/m2  linked with HTN and diabetes    Essential hypertension DASH diet and commitment to daily physical activity for a minimum of 30 minutes discussed and encouraged, as a part of hypertension management. The importance of attaining a healthy weight is also discussed.  BP/Weight 06/19/2021 03/25/2021 01/25/2021 01/09/2021 12/25/2020 09/11/2020 123456  Systolic BP [id number] A999333 - [id number] A999333 [id number]  Diastolic BP 86 89 71 - 80 82 77  Wt. (Lbs) 192.08 194 191 191 195.2 190 190  BMI 37.51 37.89 37.3 37.3 38.12 37.11 37.11     Controlled, no change in medication   Dyslipidemia, goal LDL below 100 Hyperlipidemia:Low fat diet discussed and encouraged.   Lipid Panel  Lab Results  Component Value Date   CHOL 142 06/11/2021   HDL 45 06/11/2021   LDLCALC 82 06/11/2021   TRIG 79 06/11/2021   CHOLHDL 3.2 06/11/2021     Controlled, no change in medication   Encounter for cervical Pap smear with pelvic exam Exam as documented and vaginal pap sent

## 2021-06-20 ENCOUNTER — Encounter: Payer: Self-pay | Admitting: Family Medicine

## 2021-06-20 DIAGNOSIS — Z01419 Encounter for gynecological examination (general) (routine) without abnormal findings: Secondary | ICD-10-CM | POA: Insufficient documentation

## 2021-06-20 NOTE — Assessment & Plan Note (Signed)
Exam as documented and vaginal pap sent

## 2021-06-20 NOTE — Assessment & Plan Note (Signed)
  Patient re-educated about  the importance of commitment to a  minimum of 150 minutes of exercise per week as able.  The importance of healthy food choices with portion control discussed, as well as eating regularly and within a 12 hour window most days. The need to choose "clean , green" food 50 to 75% of the time is discussed, as well as to make water the primary drink and set a goal of 64 ounces water daily.    Weight /BMI 06/19/2021 03/25/2021 01/25/2021  WEIGHT 192 lb 1.3 oz 194 lb 191 lb  HEIGHT '5\' 0"'$  '5\' 0"'$  '5\' 0"'$   BMI 37.51 kg/m2 37.89 kg/m2 37.3 kg/m2  linked with HTN and diabetes

## 2021-06-20 NOTE — Assessment & Plan Note (Signed)
Refer therapist and start lexapro, re eval in 6 to 8 weeks

## 2021-06-20 NOTE — Assessment & Plan Note (Signed)
DASH diet and commitment to daily physical activity for a minimum of 30 minutes discussed and encouraged, as a part of hypertension management. The importance of attaining a healthy weight is also discussed.  BP/Weight 06/19/2021 03/25/2021 01/25/2021 01/09/2021 12/25/2020 09/11/2020 123456  Systolic BP [id number] A999333 - [id number] A999333 [id number]  Diastolic BP 86 89 71 - 80 82 77  Wt. (Lbs) 192.08 194 191 191 195.2 190 190  BMI 37.51 37.89 37.3 37.3 38.12 37.11 37.11     Controlled, no change in medication

## 2021-06-20 NOTE — Assessment & Plan Note (Signed)
Deteriorated , [place] metformin  Ms. Queenan is reminded of the importance of commitment to daily physical activity for 30 minutes or more, as able and the need to limit carbohydrate intake to 30 to 60 grams per meal to help with blood sugar control.   The need to take medication as prescribed, test blood sugar as directed, and to call between visits if there is a concern that blood sugar is uncontrolled is also discussed.   Ms. Gruber is reminded of the importance of daily foot exam, annual eye examination, and good blood sugar, blood pressure and cholesterol control.  Diabetic Labs Latest Ref Rng & Units 06/11/2021 01/24/2021 01/10/2021 09/05/2020 05/17/2020  HbA1c 4.8 - 5.6 % 7.9(H) - 7.4(H) 7.3(H) 6.9(A)  Microalbumin mg/dL - - - - -  Micro/Creat Ratio 0 - 29 mg/g creat - - 5 - -  Chol 100 - 199 mg/dL 142 - - 125 -  HDL >39 mg/dL 45 - - 44(L) -  Calc LDL 0 - 99 mg/dL 82 - - 66 -  Triglycerides 0 - 149 mg/dL 79 - - 72 -  Creatinine 0.57 - 1.00 mg/dL 1.06(H) 1.22(H) 1.33(H) 1.11(H) -   BP/Weight 06/19/2021 03/25/2021 01/25/2021 01/09/2021 12/25/2020 09/11/2020 123456  Systolic BP [id number] A999333 - [id number] A999333 [id number]  Diastolic BP 86 89 71 - 80 82 77  Wt. (Lbs) 192.08 194 191 191 195.2 190 190  BMI 37.51 37.89 37.3 37.3 38.12 37.11 37.11   Foot/eye exam completion dates Latest Ref Rng & Units 04/08/2021 09/11/2020  Eye Exam No Retinopathy Retinopathy(A) -  Foot Form Completion - - Done

## 2021-06-20 NOTE — Assessment & Plan Note (Signed)
Hyperlipidemia:Low fat diet discussed and encouraged.   Lipid Panel  Lab Results  Component Value Date   CHOL 142 06/11/2021   HDL 45 06/11/2021   LDLCALC 82 06/11/2021   TRIG 79 06/11/2021   CHOLHDL 3.2 06/11/2021     Controlled, no change in medication

## 2021-06-21 LAB — CYTOLOGY - PAP
Comment: NEGATIVE
Diagnosis: NEGATIVE
High risk HPV: NEGATIVE

## 2021-07-02 ENCOUNTER — Telehealth: Payer: Medicare Other

## 2021-07-02 ENCOUNTER — Other Ambulatory Visit: Payer: Self-pay

## 2021-07-17 ENCOUNTER — Ambulatory Visit (INDEPENDENT_AMBULATORY_CARE_PROVIDER_SITE_OTHER): Payer: Medicare Other | Admitting: Licensed Clinical Social Worker

## 2021-07-17 ENCOUNTER — Other Ambulatory Visit: Payer: Self-pay

## 2021-07-17 DIAGNOSIS — Z7689 Persons encountering health services in other specified circumstances: Secondary | ICD-10-CM

## 2021-07-17 NOTE — BH Specialist Note (Signed)
[facility] Initial Clinical Assessment  MRN: [id number] NAME: Kelli Spencer Date: 07/17/21  Start time: 1132a End time: 1140a   Type of Contact: telephone (Initial Telephone Assessment or Urgent Video Visit) Patient consent obtained: Yes  Referring Provider: Dr. Moshe Cipro Reason for Visit today: to initiate [facility] service  Treatment History Patient recently received Inpatient Treatment: No    Patient currently being seen by therapist/psychiatrist: No  Patient currently receiving the following services: no  Past Psychiatric History/Diagnosis/Hospitalization(s): Anxiety: No Bipolar Disorder: No Depression: No Mania: No Psychosis: No Schizophrenia: No Personality Disorder: No Hospitalization for psychiatric illness: No History of Electroconvulsive Shock Therapy: No Prior Suicide Attempts: No  Decreased need for sleep: No  Euphoria: No  Self Injurious behaviors: No  Family History of mental illness: No  Family History of substance abuse: No  Substance Abuse: No  DUI: No  Insomnia: No   History of violence: No  Physical, sexual or emotional abuse: No  Prior outpatient mental health therapy: No   Clinical Assessment  PHQ-9 & GAD-7 Assessments: Declined to complete scale  Social Functioning Social maturity: WNL Social judgement: WNL  Stress Current stressors: daughter moved into the home Familial stressors: daughter currently not working Sleep: WNL  Appetite: WNL Coping ability: WNL Patient taking medications as prescribed:    Current medications:  Outpatient Encounter Medications as of 07/17/2021  Medication Sig   ACCU-CHEK GUIDE test strip USE TO CHECK BLOOD SUGAR TWICE DAILY   Accu-Chek Softclix Lancets lancets USE TO TEST blood sugar twice daily  Dx E11.65   allopurinol (ZYLOPRIM) 300 MG tablet TAKE 1 TABLET BY MOUTH  DAILY (Patient taking differently: Take 300 mg by mouth daily.)   aspirin-sod bicarb-citric acid (ALKA-SELTZER) 325 MG TBEF tablet  Take 650 mg by mouth at bedtime as needed (indigestion).   blood glucose meter kit and supplies Dispense based on patient and insurance preference. Once daily testing dx e11.9   cetirizine (ZYRTEC) 10 MG tablet Take 10 mg by mouth at bedtime as needed for allergies.   cholecalciferol (VITAMIN D) 400 UNITS TABS tablet Take 800 Units by mouth daily.   clotrimazole-betamethasone (LOTRISONE) cream Apply cream twice daily to rash for 1 week, then as needed (Patient taking differently: Apply 1 application topically daily as needed (rash).)   escitalopram (LEXAPRO) 10 MG tablet Take 1 tablet (10 mg total) by mouth daily.   glipiZIDE (GLUCOTROL) 10 MG tablet TAKE 1 TABLET BY MOUTH  TWICE DAILY BEFORE MEALS (Patient taking differently: Take 10 mg by mouth 2 (two) times daily before a meal.)   hydrochlorothiazide (MICROZIDE) 12.5 MG capsule TAKE 1 CAPSULE BY MOUTH  DAILY (Patient taking differently: Take 12.5 mg by mouth daily.)   hydrOXYzine (ATARAX/VISTARIL) 50 MG tablet TAKE 1 TABLET BY MOUTH AT  BEDTIME FOR ITCHING OR  ANXIETY (Patient taking differently: Take 50 mg by mouth at bedtime as needed for itching.)   lubiprostone (AMITIZA) 24 MCG capsule TAKE 1 CAPSULE(24 MCG) BY MOUTH TWICE DAILY WITH A MEAL   metFORMIN (GLUCOPHAGE) 500 MG tablet Take one tablet by mouth 3 times daily with meals   pravastatin (PRAVACHOL) 10 MG tablet TAKE 1 TABLET(10 MG) BY MOUTH DAILY   RABEprazole (ACIPHEX) 20 MG tablet TAKE 1 TABLET(20 MG) BY MOUTH TWICE DAILY BEFORE A MEAL   tiZANidine (ZANAFLEX) 4 MG capsule Take 1 capsule (4 mg total) by mouth at bedtime as needed for muscle spasms.   TRADJENTA 5 MG TABS tablet TAKE 1 TABLET(5 MG) BY MOUTH DAILY  No facility-administered encounter medications on file as of 07/17/2021.     Self-harm and/or Suicidal Behaviors Risk Assessment Self-harm risk factors: no Patient endorses recent self injurious thoughts and/or behaviors: No  Suicide ideations: No plan to harm self or  others   Danger to Others Risk Assessment Danger to others risk factors: no Patient endorses recent thoughts of harming others: No    Substance Use Assessment Patient recently consumed alcohol: No  Patient recently used drugs: No  Patient is concerned about dependence or abuse of substances: No    Goals, Interventions and Follow-up Plan Goals: Increase healthy adjustment to current life circumstances Interventions: Solution-Focused Strategies and Mindfulness or Relaxation Training Follow-up Plan:  Patient declines service  Summary of Clinical Assessment Summary: Kelli Spencer is a 55 yr old woman that was referred by Dr. Moshe Cipro for counseling.  Patient reports that her daughter recently moved in the home. She acknowledges that she is stressed she does not want counseling.  She states, "I put it in God's hands. Talking about it will not help, she has to get on her feet and move out."   Kelli South, LCSW

## 2021-07-29 DIAGNOSIS — I10 Essential (primary) hypertension: Secondary | ICD-10-CM | POA: Diagnosis not present

## 2021-07-30 LAB — BMP8+EGFR
BUN/Creatinine Ratio: 19 (ref 9–23)
BUN: 21 mg/dL (ref 6–24)
CO2: 23 mmol/L (ref 20–29)
Calcium: 10.2 mg/dL (ref 8.7–10.2)
Chloride: 97 mmol/L (ref 96–106)
Creatinine, Ser: 1.11 mg/dL — ABNORMAL HIGH (ref 0.57–1.00)
Glucose: 120 mg/dL — ABNORMAL HIGH (ref 70–99)
Potassium: 4.2 mmol/L (ref 3.5–5.2)
Sodium: 138 mmol/L (ref 134–144)
eGFR: 58 mL/min/{1.73_m2} — ABNORMAL LOW (ref >59)

## 2021-07-31 ENCOUNTER — Ambulatory Visit (INDEPENDENT_AMBULATORY_CARE_PROVIDER_SITE_OTHER): Payer: Medicare Other | Admitting: Licensed Clinical Social Worker

## 2021-07-31 ENCOUNTER — Other Ambulatory Visit: Payer: Self-pay

## 2021-07-31 DIAGNOSIS — F322 Major depressive disorder, single episode, severe without psychotic features: Secondary | ICD-10-CM

## 2021-08-02 ENCOUNTER — Other Ambulatory Visit: Payer: Self-pay

## 2021-08-02 ENCOUNTER — Ambulatory Visit (INDEPENDENT_AMBULATORY_CARE_PROVIDER_SITE_OTHER): Payer: Medicare Other | Admitting: Family Medicine

## 2021-08-02 VITALS — Ht 60.0 in | Wt 194.0 lb

## 2021-08-02 DIAGNOSIS — F322 Major depressive disorder, single episode, severe without psychotic features: Secondary | ICD-10-CM | POA: Diagnosis not present

## 2021-08-02 DIAGNOSIS — E559 Vitamin D deficiency, unspecified: Secondary | ICD-10-CM | POA: Diagnosis not present

## 2021-08-02 DIAGNOSIS — I10 Essential (primary) hypertension: Secondary | ICD-10-CM

## 2021-08-02 DIAGNOSIS — Z2821 Immunization not carried out because of patient refusal: Secondary | ICD-10-CM

## 2021-08-02 DIAGNOSIS — E790 Hyperuricemia without signs of inflammatory arthritis and tophaceous disease: Secondary | ICD-10-CM | POA: Diagnosis not present

## 2021-08-02 DIAGNOSIS — E1159 Type 2 diabetes mellitus with other circulatory complications: Secondary | ICD-10-CM | POA: Diagnosis not present

## 2021-08-02 NOTE — Patient Instructions (Addendum)
F/U week of November 21 , re evaluate diabetes, call if you need me sooner  Non fast hBa1C, chem 7 and EGFR , CBC, TSH vit D , uric acid level 1 week before visit  [place]!!!  Continue taking medication every day as prescribed  Continue to test and record blood sugar every morning  It is important that you exercise regularly at least 30 minutes 5 times a week. If you develop chest pain, have severe difficulty breathing, or feel very tired, stop exercising immediately and seek medical attention   Thanks for choosing [facility], we consider it a privelige to serve you.   Reconsider the vaccines you need them!

## 2021-08-02 NOTE — Progress Notes (Signed)
Virtual Visit via Telephone Note  I connected with Kelli Spencer on 08/02/21 at  2:20 PM EDT by telephone and verified that I am speaking with the correct person using two identifiers.  Location: Patient: HOME Provider: OFFICE   I discussed the limitations, risks, security and privacy concerns of performing an evaluation and management service by telephone and the availability of in person appointments. I also discussed with the patient that there may be a patient responsible charge related to this service. The patient expressed understanding and agreed to proceed.   History of Present Illness: Patient reports marked improvement in blood sugar range from 80 to 120, denies elevated sugars or hypoglycemic episodes, feels well Improvement in depression, no longer depressed, taking medication , denies adverse s/e. Understands the need to continue medication and will do so   Observations/Objective: Ht 5' (1.524 m)   Wt 194 lb (88 kg)   LMP 06/10/1988 (Approximate)   BMI 37.89 kg/m  Good communication with no confusion and intact memory. Alert and oriented x 3 No signs of respiratory distress during speech   Assessment and Plan: Depression, major, single episode, severe ([facility]) Improved on medication, will continue same  Type 2 diabetes mellitus with vascular disease ([place]) Improved based on patient reporting Updated lab needed at/ before next visit. Kelli Spencer is reminded of the importance of commitment to daily physical activity for 30 minutes or more, as able and the need to limit carbohydrate intake to 30 to 60 grams per meal to help with blood sugar control.   The need to take medication as prescribed, test blood sugar as directed, and to call between visits if there is a concern that blood sugar is uncontrolled is also discussed.   Kelli Spencer is reminded of the importance of daily foot exam, annual eye examination, and good blood sugar, blood pressure and cholesterol  control.  Diabetic Labs Latest Ref Rng & Units 07/29/2021 06/11/2021 01/24/2021 01/10/2021 09/05/2020  HbA1c 4.8 - 5.6 % - 7.9(H) - 7.4(H) 7.3(H)  Microalbumin mg/dL - - - - -  Micro/Creat Ratio 0 - 29 mg/g creat - - - 5 -  Chol 100 - 199 mg/dL - 142 - - 125  HDL >39 mg/dL - 45 - - 44(L)  Calc LDL 0 - 99 mg/dL - 82 - - 66  Triglycerides 0 - 149 mg/dL - 79 - - 72  Creatinine 0.57 - 1.00 mg/dL 1.11(H) 1.06(H) 1.22(H) 1.33(H) 1.11(H)   BP/Weight 08/02/2021 06/19/2021 03/25/2021 01/25/2021 01/09/2021 12/25/2020 12/10/7410  Systolic BP - 878 676 720 - 947 096  Diastolic BP - 86 89 71 - 80 82  Wt. (Lbs) 194 192.08 194 191 191 195.2 190  BMI 37.89 37.51 37.89 37.3 37.3 38.12 37.11   Foot/eye exam completion dates Latest Ref Rng & Units 04/08/2021 09/11/2020  Eye Exam No Retinopathy Retinopathy(A) -  Foot Form Completion - - Done        Morbid obesity ([place])  Patient re-educated about  the importance of commitment to a  minimum of 150 minutes of exercise per week as able.  The importance of healthy food choices with portion control discussed, as well as eating regularly and within a 12 hour window most days. The need to choose "clean , green" food 50 to 75% of the time is discussed, as well as to make water the primary drink and set a goal of 64 ounces water daily.    Weight /BMI 08/02/2021 06/19/2021 03/25/2021  WEIGHT 194 lb 192  lb 1.3 oz 194 lb  HEIGHT 5\' 0"  5\' 0"  5\' 0"   BMI 37.89 kg/m2 37.51 kg/m2 37.89 kg/m2      Immunization refused Re educated and encouraged to reconsider vaccines, needs flu and pneumonia vaccines   Follow Up Instructions:    I discussed the assessment and treatment plan with the patient. The patient was provided an opportunity to ask questions and all were answered. The patient agreed with the plan and demonstrated an understanding of the instructions.   The patient was advised to call back or seek an in-person evaluation if the symptoms worsen or if the condition  fails to improve as anticipated.  I provided  15 minutes of non-face-to-face time during this encounter.   Tula Nakayama, MD

## 2021-08-02 NOTE — Progress Notes (Signed)
Left message encouraging contact 

## 2021-08-04 ENCOUNTER — Encounter: Payer: Self-pay | Admitting: Family Medicine

## 2021-08-04 NOTE — Assessment & Plan Note (Signed)
Improved based on patient reporting Updated lab needed at/ before next visit. [name] is reminded of the importance of commitment to daily physical activity for 30 minutes or more, as able and the need to limit carbohydrate intake to 30 to 60 grams per meal to help with blood sugar control.   The need to take medication as prescribed, test blood sugar as directed, and to call between visits if there is a concern that blood sugar is uncontrolled is also discussed.   Kelli Spencer is reminded of the importance of daily foot exam, annual eye examination, and good blood sugar, blood pressure and cholesterol control.  Diabetic Labs Latest Ref Rng & Units 07/29/2021 06/11/2021 01/24/2021 01/10/2021 09/05/2020  HbA1c 4.8 - 5.6 % - 7.9(H) - 7.4(H) 7.3(H)  Microalbumin mg/dL - - - - -  Micro/Creat Ratio 0 - 29 mg/g creat - - - 5 -  Chol 100 - 199 mg/dL - 142 - - 125  HDL >39 mg/dL - 45 - - 44(L)  Calc LDL 0 - 99 mg/dL - 82 - - 66  Triglycerides 0 - 149 mg/dL - 79 - - 72  Creatinine 0.57 - 1.00 mg/dL 1.11(H) 1.06(H) 1.22(H) 1.33(H) 1.11(H)   BP/Weight 08/02/2021 06/19/2021 03/25/2021 01/25/2021 01/09/2021 12/25/2020 20/06/222  Systolic BP - 361 224 497 - 530 051  Diastolic BP - 86 89 71 - 80 82  Wt. (Lbs) 194 192.08 194 191 191 195.2 190  BMI 37.89 37.51 37.89 37.3 37.3 38.12 37.11   Foot/eye exam completion dates Latest Ref Rng & Units 04/08/2021 09/11/2020  Eye Exam No Retinopathy Retinopathy(A) -  Foot Form Completion - - Done

## 2021-08-04 NOTE — Assessment & Plan Note (Signed)
Re educated and encouraged to reconsider vaccines, needs flu and pneumonia vaccines

## 2021-08-04 NOTE — Assessment & Plan Note (Signed)
Improved on medication, will continue same

## 2021-08-04 NOTE — Assessment & Plan Note (Signed)
  Patient re-educated about  the importance of commitment to a  minimum of 150 minutes of exercise per week as able.  The importance of healthy food choices with portion control discussed, as well as eating regularly and within a 12 hour window most days. The need to choose "clean , green" food 50 to 75% of the time is discussed, as well as to make water the primary drink and set a goal of 64 ounces water daily.    Weight /BMI 08/02/2021 06/19/2021 03/25/2021  WEIGHT 194 lb 192 lb 1.3 oz 194 lb  HEIGHT 5\' 0"  5\' 0"  5\' 0"   BMI 37.89 kg/m2 37.51 kg/m2 37.89 kg/m2

## 2021-08-22 ENCOUNTER — Encounter: Payer: Self-pay | Admitting: Family Medicine

## 2021-08-22 ENCOUNTER — Other Ambulatory Visit: Payer: Self-pay

## 2021-08-22 ENCOUNTER — Ambulatory Visit (INDEPENDENT_AMBULATORY_CARE_PROVIDER_SITE_OTHER): Payer: Medicare Other | Admitting: Family Medicine

## 2021-08-22 DIAGNOSIS — R058 Other specified cough: Secondary | ICD-10-CM | POA: Insufficient documentation

## 2021-08-22 MED ORDER — MONTELUKAST SODIUM 10 MG PO TABS: 10.0000 mg | ORAL_TABLET | Freq: Every day | ORAL | 3 refills | Status: DC

## 2021-08-22 NOTE — Patient Instructions (Signed)
F/U as before, call if you need me sooner  Once daily montelukast is prescribed for your cough which I believ is allergic based  Thanks for choosing [facility], we consider it a privelige to serve you.

## 2021-08-22 NOTE — Assessment & Plan Note (Signed)
Uncontrolled dry cough x 10 days, start daily singulair, call back if persisits

## 2021-08-22 NOTE — Progress Notes (Signed)
Virtual Visit via Telephone Note  I connected with Kelli Spencer on 08/22/21 at  1:00 PM EDT by telephone and verified that I am speaking with the correct person using two identifiers.  Location: Patient: home Provider: office   I discussed the limitations, risks, security and privacy concerns of performing an evaluation and management service by telephone and the availability of in person appointments. I also discussed with the patient that there may be a patient responsible charge related to this service. The patient expressed understanding and agreed to proceed.   History of Present Illness: Dry hacking cough x 10 days, no fever , chills, no sputum, simnus pressire , nasal drainage or congestion Reports great blood sugar readings   Observations/Objective: BP 117/76   LMP 06/10/1988 (Approximate)  Good communication with no confusion and intact memory. Alert and oriented x 3 No signs of respiratory distress during speech   Assessment and Plan:  Allergic cough Uncontrolled dry cough x 10 days, start daily singulair, call back if persisits  Follow Up Instructions:    I discussed the assessment and treatment plan with the patient. The patient was provided an opportunity to ask questions and all were answered. The patient agreed with the plan and demonstrated an understanding of the instructions.   The patient was advised to call back or seek an in-person evaluation if the symptoms worsen or if the condition fails to improve as anticipated.  I provided 8  minutes of non-face-to-face time during this encounter.   Tula Nakayama, MD

## 2021-08-23 ENCOUNTER — Other Ambulatory Visit: Payer: Self-pay | Admitting: Family Medicine

## 2021-09-04 ENCOUNTER — Ambulatory Visit: Payer: Medicare Other | Admitting: Gastroenterology

## 2021-09-09 ENCOUNTER — Telehealth: Payer: Self-pay | Admitting: Family Medicine

## 2021-09-09 NOTE — Telephone Encounter (Signed)
T called in about BP med . Pt states that med is making her feel dizzy and make pt stumble  Want to discuss issues

## 2021-09-12 NOTE — Telephone Encounter (Signed)
Called pt lvm Also called 09/11/21 and rang no answer

## 2021-09-13 NOTE — Telephone Encounter (Signed)
Her blood sugar was 93 that am when she woke up and she did not eat but took her bp med and glipizide and went to church and started feeling "off balance" and funny. Has appt to come in to be seen on 11/25. States shes been eating a lot better and her blood sugar may have dropped too low since taking the med without eating.

## 2021-09-20 DIAGNOSIS — E790 Hyperuricemia without signs of inflammatory arthritis and tophaceous disease: Secondary | ICD-10-CM | POA: Diagnosis not present

## 2021-09-20 DIAGNOSIS — E559 Vitamin D deficiency, unspecified: Secondary | ICD-10-CM | POA: Diagnosis not present

## 2021-09-20 DIAGNOSIS — E1159 Type 2 diabetes mellitus with other circulatory complications: Secondary | ICD-10-CM | POA: Diagnosis not present

## 2021-09-20 DIAGNOSIS — I10 Essential (primary) hypertension: Secondary | ICD-10-CM | POA: Diagnosis not present

## 2021-09-21 LAB — CBC
Hematocrit: 38.8 % (ref 34.0–46.6)
Hemoglobin: 12.8 g/dL (ref 11.1–15.9)
MCH: 30 pg (ref 26.6–33.0)
MCHC: 33 g/dL (ref 31.5–35.7)
MCV: 91 fL (ref 79–97)
Platelets: 296 10*3/uL (ref 150–450)
RBC: 4.26 x10E6/uL (ref 3.77–5.28)
RDW: 12.8 % (ref 11.7–15.4)
WBC: 7.7 10*3/uL (ref 3.4–10.8)

## 2021-09-21 LAB — CMP14+EGFR
ALT: 11 IU/L (ref 0–32)
AST: 14 IU/L (ref 0–40)
Albumin/Globulin Ratio: 1.5 (ref 1.2–2.2)
Albumin: 4.5 g/dL (ref 3.8–4.9)
Alkaline Phosphatase: 108 IU/L (ref 44–121)
BUN/Creatinine Ratio: 16 (ref 9–23)
BUN: 21 mg/dL (ref 6–24)
Bilirubin Total: 0.3 mg/dL (ref 0.0–1.2)
CO2: 24 mmol/L (ref 20–29)
Calcium: 10.2 mg/dL (ref 8.7–10.2)
Chloride: 98 mmol/L (ref 96–106)
Creatinine, Ser: 1.3 mg/dL — ABNORMAL HIGH (ref 0.57–1.00)
Globulin, Total: 3.1 g/dL (ref 1.5–4.5)
Glucose: 123 mg/dL — ABNORMAL HIGH (ref 70–99)
Potassium: 4.7 mmol/L (ref 3.5–5.2)
Sodium: 141 mmol/L (ref 134–144)
Total Protein: 7.6 g/dL (ref 6.0–8.5)
eGFR: 48 mL/min/{1.73_m2} — ABNORMAL LOW (ref >59)

## 2021-09-21 LAB — HEMOGLOBIN A1C
Est. average glucose Bld gHb Est-mCnc: 163 mg/dL
Hgb A1c MFr Bld: 7.3 % — ABNORMAL HIGH (ref 4.8–5.6)

## 2021-09-21 LAB — TSH: TSH: 2.08 u[IU]/mL (ref 0.450–4.500)

## 2021-09-21 LAB — VITAMIN D 25 HYDROXY (VIT D DEFICIENCY, FRACTURES): Vit D, 25-Hydroxy: 46.9 ng/mL (ref 30.0–100.0)

## 2021-09-21 LAB — URIC ACID: Uric Acid: 3.8 mg/dL (ref 3.0–7.2)

## 2021-09-23 ENCOUNTER — Ambulatory Visit (INDEPENDENT_AMBULATORY_CARE_PROVIDER_SITE_OTHER): Payer: Medicare Other

## 2021-09-23 ENCOUNTER — Other Ambulatory Visit: Payer: Self-pay

## 2021-09-23 VITALS — BP 136/78 | Ht 60.0 in | Wt 183.0 lb

## 2021-09-23 DIAGNOSIS — Z0000 Encounter for general adult medical examination without abnormal findings: Secondary | ICD-10-CM

## 2021-09-23 NOTE — Patient Instructions (Signed)
  Ms. Birkhead , Thank you for taking time to come for your Medicare Wellness Visit. I appreciate your ongoing commitment to your health goals. Please review the following plan we discussed and let me know if I can assist you in the future.   These are the goals we discussed:  Goals      DIET - EAT MORE FRUITS AND VEGETABLES     Follow the low purine diet      HEMOGLOBIN A1C < 7     Increase physical activity     Try to walk 30 minutes after dinner 2 evenings a week         This is a list of the screening recommended for you and due dates:  Health Maintenance  Topic Date Due   Pneumococcal Vaccination (1 - PCV) Never done   Tetanus Vaccine  Never done   Zoster (Shingles) Vaccine (1 of 2) Never done   Stool Blood Test  08/07/2020   COVID-19 Vaccine (1) 01/23/2022*   Flu Shot  01/31/2022*   Urine Protein Check  01/10/2022   Hemoglobin A1C  03/20/2022   Eye exam for diabetics  04/08/2022   Complete foot exam   06/20/2022   Mammogram  05/31/2023   Pap Smear  06/19/2024   Colon Cancer Screening  06/23/2026   Hepatitis C Screening: USPSTF Recommendation to screen - Ages 18-79 yo.  Completed   HIV Screening  Completed   HPV Vaccine  Aged Out  *Topic was postponed. The date shown is not the original due date.

## 2021-09-23 NOTE — Progress Notes (Signed)
Subjective:   Kelli Spencer is a 55 y.o. female who presents for Medicare Annual (Subsequent) preventive examination.  I connected with  Kelli Spencer on 09/23/21 by a audio enabled telemedicine application and verified that I am speaking with the correct person using two identifiers.  Patient Location: Home  Provider Location: Office/Clinic  I discussed the limitations of evaluation and management by telemedicine. The patient expressed understanding and agreed to proceed.   Review of Systems     Cardiac Risk Factors include: diabetes mellitus;hypertension;obesity (BMI >30kg/m2);sedentary lifestyle     Objective:    Today's Vitals   09/23/21 0749 09/23/21 0758  Weight: 183 lb (83 kg)   Height: 5' (1.524 m)   PainSc: 0-No pain 0-No pain   Body mass index is 35.74 kg/m.  Advanced Directives 09/23/2021 01/25/2021 09/20/2020 12/27/2018 04/02/2018 12/16/2017 04/27/2017  Does Patient Have a Medical Advance Directive? _0  No No  Would patient like information on creating a medical advance directive? Yes (ED - Information included in AVS) Yes (MAU/Ambulatory/Procedural Areas - Information given) No - Patient declined Yes (ED - Information included in AVS) Yes (MAU/Ambulatory/Procedural Areas - Information given) Yes (ED - Information included in AVS) -  Pre-existing out of facility DNR order (yellow form or pink MOST form) - - - - - - -    Current Medications (verified) Outpatient Encounter Medications as of 09/23/2021  Medication Sig   ACCU-CHEK GUIDE test strip USE TO CHECK BLOOD SUGAR TWICE DAILY   Accu-Chek Softclix Lancets lancets USE TO TEST blood sugar twice daily  Dx E11.65   allopurinol (ZYLOPRIM) 300 MG tablet TAKE 1 TABLET BY MOUTH  DAILY   aspirin-sod bicarb-citric acid (ALKA-SELTZER) 325 MG TBEF tablet Take 650 mg by mouth at bedtime as needed (indigestion).   blood glucose meter kit and supplies Dispense based on patient and insurance preference. Once daily  testing dx e11.9   cetirizine (ZYRTEC) 10 MG tablet Take 10 mg by mouth at bedtime as needed for allergies.   cholecalciferol (VITAMIN D) 400 UNITS TABS tablet Take 800 Units by mouth daily.   clotrimazole-betamethasone (LOTRISONE) cream Apply cream twice daily to rash for 1 week, then as needed   escitalopram (LEXAPRO) 10 MG tablet Take 1 tablet (10 mg total) by mouth daily.   glipiZIDE (GLUCOTROL) 10 MG tablet TAKE 1 TABLET BY MOUTH  TWICE DAILY BEFORE MEALS   hydrochlorothiazide (MICROZIDE) 12.5 MG capsule TAKE 1 CAPSULE BY MOUTH  DAILY   hydrOXYzine (ATARAX/VISTARIL) 50 MG tablet TAKE 1 TABLET BY MOUTH AT  BEDTIME FOR ITCHING OR  ANXIETY   lubiprostone (AMITIZA) 24 MCG capsule TAKE 1 CAPSULE(24 MCG) BY MOUTH TWICE DAILY WITH A MEAL   metFORMIN (GLUCOPHAGE) 500 MG tablet Take one tablet by mouth 3 times daily with meals   montelukast (SINGULAIR) 10 MG tablet Take 1 tablet (10 mg total) by mouth at bedtime.   pravastatin (PRAVACHOL) 10 MG tablet TAKE 1 TABLET(10 MG) BY MOUTH DAILY   RABEprazole (ACIPHEX) 20 MG tablet TAKE 1 TABLET(20 MG) BY MOUTH TWICE DAILY BEFORE A MEAL   tiZANidine (ZANAFLEX) 4 MG capsule Take 1 capsule (4 mg total) by mouth at bedtime as needed for muscle spasms.   TRADJENTA 5 MG TABS tablet TAKE 1 TABLET(5 MG) BY MOUTH DAILY   No facility-administered encounter medications on file as of 09/23/2021.    Allergies (verified) Prednisone, Amlodipine, and Ace inhibitors   History: Past Medical History:  Diagnosis Date   Arthritis  Asthma    Diabetes mellitus without complication (HCC)    Diastolic hypertension    GERD (gastroesophageal reflux disease)    Hyperlipidemia    Hypertension    Migraines    Obesity    Seizures ([place])    pt states she has seizures only when she gets upset. pt is taking no seizure meds   Past Surgical History:  Procedure Laterality Date   ABDOMINAL HYSTERECTOMY  1997   fibroids   BALLOON DILATION N/A 01/25/2021   Procedure: BALLOON  DILATION;  Surgeon: Eloise Harman, DO;  [facility];  Service: Endoscopy;  Laterality: N/A;   BIOPSY  01/25/2021   Procedure: BIOPSY;  Surgeon: Eloise Harman, DO;  [facility];  Service: Endoscopy;;   COLONOSCOPY N/A 06/23/2016   Dr. Oneida Alar: prep not adequate to detect polyps less than 68m, redundant left colon. hemorrhoids. next TCS in 5 years.    ESOPHAGOGASTRODUODENOSCOPY N/A 04/02/2018   Dr. fOneida Alar Benign-appearing esophageal stricture status post dilation, multiple benign gastric polyps, mild gastritis no H. pylori.   ESOPHAGOGASTRODUODENOSCOPY (EGD) WITH PROPOFOL N/A 01/25/2021   Procedure: ESOPHAGOGASTRODUODENOSCOPY (EGD) WITH PROPOFOL;  Surgeon: CEloise Harman DO;  [facility];  Service: Endoscopy;  Laterality: N/A;  am appt   KNEE ARTHROSCOPY WITH MEDIAL MENISECTOMY Left 02/09/2014   Procedure: KNEE ARTHROSCOPY WITH MEDIAL MENISECTOMY;  Surgeon: SCarole Civil MD;  Location: [facility];  Service: Orthopedics;  Laterality: Left;   PARTIAL HYSTERECTOMY     SAVORY DILATION N/A 04/02/2018   Procedure: SAVORY DILATION;  Surgeon: FDanie Binder MD;  [facility];  Service: Endoscopy;  Laterality: N/A;   TOTAL KNEE ARTHROPLASTY Right 03/18/2017   Procedure: TOTAL KNEE ARTHROPLASTY;  Surgeon: HCarole Civil MD;  Location: [facility];  Service: Orthopedics;  Laterality: Right;   Family History  Problem Relation Age of Onset   Kidney failure Mother    Diabetes Mother    Hypertension Mother    Stroke Mother    Diabetes Father    Heart disease Father    Hypertension Sister    Colon cancer Neg Hx    Social History   Socioeconomic History   Marital status: Divorced    Spouse name: Not on file   Number of children: 3   Years of education: Not on file   Highest education level: 9th grade  Occupational History   Occupation: disabled  Tobacco Use   Smoking status: Never   Smokeless tobacco: Never  Vaping Use   Vaping Use: Never  used  Substance and Sexual Activity   Alcohol use: No   Drug use: No   Sexual activity: Not Currently  Other Topics Concern   Not on file  Social History Narrative   Daughter currently living with her until she can get her own place.    Social Determinants of Health   Financial Resource Strain: Low Risk    Difficulty of Paying Living Expenses: Not hard at all  Food Insecurity: No Food Insecurity   Worried About RCharity fundraiserin the Last Year: Never true   RT[place]ain the Last Year: Never true  Transportation Needs: No Transportation Needs   Lack of Transportation (Medical): No   Lack of Transportation (Non-Medical): No  Physical Activity: Insufficiently Active   Days of Exercise per Week: 3 days   Minutes of Exercise per Session: 10 min  Stress: Not on file  Social Connections: Moderately Isolated  Frequency of Communication with Friends and Family: More than three times a week   Frequency of Social Gatherings with Friends and Family: More than three times a week   Attends Religious Services: More than 4 times per year   Active Member of [employer] or Organizations: No   Attends Music therapist: Never   Marital Status: Divorced    Tobacco Counseling Counseling given: Not Answered   Clinical Intake:  Pre-visit preparation completed: No  Pain : No/denies pain Pain Score: 0-No pain     Nutritional Status: BMI > 30  Obese Diabetes: Yes CBG done?: No Did pt. bring in CBG monitor from home?: No  How often do you need to have someone help you when you read instructions, pamphlets, or other written materials from your doctor or pharmacy?: 1 - Never What is the last grade level you completed in school?: 9th grade  Diabetic? Nutrition Risk Assessment:  Has the patient had any N/V/D within the last 2 months?  No  Does the patient have any non-healing wounds?  No  Has the patient had any unintentional weight loss or weight gain?  No    Diabetes:  Is the patient diabetic?  Yes  If diabetic, was a CBG obtained today?  No  Did the patient bring in their glucometer from home?  No  How often do you monitor your CBG's? daily  Financial Strains and Diabetes Management:  Are you having any financial strains with the device, your supplies or your medication? No .  Does the patient want to be seen by Chronic Care Management for management of their diabetes?  No  Would the patient like to be referred to a Nutritionist or for Diabetic Management?  No   Diabetic Exams:  Diabetic Eye Exam: Completed   Diabetic Foot Exam: Completed      Interpreter Needed?: No  Information entered by :: Trace Cederberg LPN   Activities of Daily Living In your present state of health, do you have any difficulty performing the following activities: 09/23/2021  Hearing? N  Vision? N  Difficulty concentrating or making decisions? N  Walking or climbing stairs? N  Dressing or bathing? N  Doing errands, shopping? N  Preparing Food and eating ? N  Using the Toilet? N  In the past six months, have you accidently leaked urine? N  Do you have problems with loss of bowel control? N  Managing your Medications? N  Managing your Finances? N  Housekeeping or managing your Housekeeping? N  Some recent data might be hidden    Patient Care Team: Fayrene Helper, MD as PCP - General (Family Medicine) Carole Civil, MD as Consulting Physician (Orthopedic Surgery) Arlana Lindau, RD as Dietitian (Nutrition) Eloise Harman, DO as Consulting Physician (Internal Medicine)  Indicate any recent Medical Services you may have received from other than [facility] providers in the past year (date may be approximate).     Assessment:   This is a routine wellness examination for [place].  Hearing/Vision screen No results found.  Dietary issues and exercise activities discussed: Current Exercise Habits: The patient does not participate in regular  exercise at present, Exercise limited by: None identified   Goals Addressed             This Visit's Progress    DIET - EAT MORE FRUITS AND VEGETABLES   On track    Follow the low purine diet      HEMOGLOBIN A1C < 7  Increase physical activity   Not on track    Try to walk 30 minutes after dinner 2 evenings a week        Depression Screen PHQ 2/9 Scores 09/23/2021 08/22/2021 08/02/2021 06/19/2021 06/19/2021 09/20/2020 09/20/2020  PHQ - 2 Score 0 0 0 6 0 1 1  PHQ- 9 Score - 0 0 17 - - -    Fall Risk Fall Risk  09/23/2021 08/22/2021 08/02/2021 06/19/2021 01/09/2021  Falls in the past year? 0 0 0 0 0  Number falls in past yr: 0 - 0 0 0  Injury with Fall? 0 - 0 0 0  Risk for fall due to : - - - - -  Follow up - - - - -    [place]:  Any stairs in or around the home? No  If so, are there any without handrails? No  Home free of loose throw rugs in walkways, pet beds, electrical cords, etc? Yes  Adequate lighting in your home to reduce risk of falls? Yes   ASSISTIVE DEVICES UTILIZED TO PREVENT FALLS:  Life alert? No  Use of a cane, walker or w/c? No  Grab bars in the bathroom? No  Shower chair or bench in shower? No  Elevated toilet seat or a handicapped toilet? No    Cognitive Function:     6CIT Screen 09/23/2021 09/20/2020 12/27/2018  What Year? 0 points 0 points 0 points  What month? 0 points 0 points 0 points  What time? 0 points 0 points 0 points  Count back from 20 0 points 0 points 0 points  Months in reverse 0 points 0 points 0 points  Repeat phrase 0 points 0 points 0 points  Total Score 0 0 0    Immunizations  There is no immunization history on file for this patient.  TDAP status: Due, Education has been provided regarding the importance of this vaccine. Advised may receive this vaccine at local pharmacy or Health Dept. Aware to provide a copy of the vaccination record if obtained from local pharmacy or Health Dept.  Verbalized acceptance and understanding.  Flu Vaccine status: Declined, Education has been provided regarding the importance of this vaccine but patient still declined. Advised may receive this vaccine at local pharmacy or Health Dept. Aware to provide a copy of the vaccination record if obtained from local pharmacy or Health Dept. Verbalized acceptance and understanding.  Pneumococcal vaccine status: Declined,  Education has been provided regarding the importance of this vaccine but patient still declined. Advised may receive this vaccine at local pharmacy or Health Dept. Aware to provide a copy of the vaccination record if obtained from local pharmacy or Health Dept. Verbalized acceptance and understanding.   Covid-19 vaccine status: Declined, Education has been provided regarding the importance of this vaccine but patient still declined. Advised may receive this vaccine at local pharmacy or Health Dept.or vaccine clinic. Aware to provide a copy of the vaccination record if obtained from local pharmacy or Health Dept. Verbalized acceptance and understanding.  Qualifies for Shingles Vaccine? Yes   Zostavax completed No   Shingrix Completed?: No.    Education has been provided regarding the importance of this vaccine. Patient has been advised to call insurance company to determine out of pocket expense if they have not yet received this vaccine. Advised may also receive vaccine at local pharmacy or Health Dept. Verbalized acceptance and understanding.  Screening Tests Health Maintenance  Topic Date Due  Pneumococcal Vaccine 43-55 Years old (1 - PCV) Never done   TETANUS/TDAP  Never done   Zoster Vaccines- Shingrix (1 of 2) Never done   COLON CANCER SCREENING ANNUAL FOBT  08/07/2020   COVID-19 Vaccine (1) 01/23/2022 (Originally 01/30/1967)   INFLUENZA VACCINE  01/31/2022 (Originally 06/03/2021)   URINE MICROALBUMIN  01/10/2022   HEMOGLOBIN A1C  03/20/2022   OPHTHALMOLOGY EXAM  04/08/2022   FOOT  EXAM  06/20/2022   MAMMOGRAM  05/31/2023   PAP SMEAR-Modifier  06/19/2024   COLONOSCOPY (Pts 45-64yr Insurance coverage will need to be confirmed)  06/23/2026   Hepatitis C Screening  Completed   HIV Screening  Completed   HPV VACCINES  Aged Out    Health Maintenance  Health Maintenance Due  Topic Date Due   Pneumococcal Vaccine [age]Years old (1 - PCV) Never done   TETANUS/TDAP  Never done   Zoster Vaccines- Shingrix (1 of 2) Never done   COLON CANCER SCREENING ANNUAL FOBT  08/07/2020    Colorectal cancer screening: Type of screening: Colonoscopy. Completed 10. Repeat every   years  Mammogram status: Completed 2022. Repeat every year    Lung Cancer Screening: (Low Dose CT Chest recommended if Age 57-80years, 30 pack-year currently smoking OR have quit w/in 15years.) does not qualify.   Lung Cancer Screening Referral: n/a  Additional Screening:  Hepatitis C Screening: does not qualify; Completed   Vision Screening: Recommended annual ophthalmology exams for early detection of glaucoma and other disorders of the eye. Is the patient up to date with their annual eye exam?  Yes  Who is the provider or what is the name of the office in which the patient attends annual eye exams? [email or web address] If pt is not established with a provider, would they like to be referred to a provider to establish care? No .   Dental Screening: Recommended annual dental exams for proper oral hygiene  Community Resource Referral / Chronic Care Management: CRR required this visit?  No   CCM required this visit?  No      Plan:     I have personally reviewed and noted the following in the patient's chart:   Medical and social history Use of alcohol, tobacco or illicit drugs  Current medications and supplements including opioid prescriptions.  Functional ability and status Nutritional status Physical activity Advanced directives List of other physicians Hospitalizations, surgeries, and ER  visits in previous 12 months Vitals Screenings to include cognitive, depression, and falls Referrals and appointments  In addition, I have reviewed and discussed with patient certain preventive protocols, quality metrics, and best practice recommendations. A written personalized care plan for preventive services as well as general preventive health recommendations were provided to patient.     BKate Sable LPN, LPN   [id number] Nurse Notes:  Ms. NNicholes, Thank you for taking time to come for your Medicare Wellness Visit. I appreciate your ongoing commitment to your health goals. Please review the following plan we discussed and let me know if I can assist you in the future.   These are the goals we discussed:  Goals      DIET - EAT MORE FRUITS AND VEGETABLES     Follow the low purine diet      HEMOGLOBIN A1C < 7     Increase physical activity     Try to walk 30 minutes after dinner 2 evenings a week         This is a list  of the screening recommended for you and due dates:  Health Maintenance  Topic Date Due   Pneumococcal Vaccination (1 - PCV) Never done   Tetanus Vaccine  Never done   Zoster (Shingles) Vaccine (1 of 2) Never done   Stool Blood Test  08/07/2020   COVID-19 Vaccine (1) 01/23/2022*   Flu Shot  01/31/2022*   Urine Protein Check  01/10/2022   Hemoglobin A1C  03/20/2022   Eye exam for diabetics  04/08/2022   Complete foot exam   06/20/2022   Mammogram  05/31/2023   Pap Smear  06/19/2024   Colon Cancer Screening  06/23/2026   Hepatitis C Screening: USPSTF Recommendation to screen - Ages 18-79 yo.  Completed   HIV Screening  Completed   HPV Vaccine  Aged Out  *Topic was postponed. The date shown is not the original due date.

## 2021-09-24 ENCOUNTER — Ambulatory Visit: Payer: Medicare Other | Admitting: Family Medicine

## 2021-09-27 ENCOUNTER — Other Ambulatory Visit: Payer: Self-pay

## 2021-09-27 ENCOUNTER — Ambulatory Visit (INDEPENDENT_AMBULATORY_CARE_PROVIDER_SITE_OTHER): Payer: Medicare Other | Admitting: Family Medicine

## 2021-09-27 ENCOUNTER — Encounter: Payer: Self-pay | Admitting: Family Medicine

## 2021-09-27 VITALS — BP 129/84 | HR 84 | Resp 18 | Ht 60.0 in | Wt 181.1 lb

## 2021-09-27 DIAGNOSIS — E785 Hyperlipidemia, unspecified: Secondary | ICD-10-CM

## 2021-09-27 DIAGNOSIS — Z2821 Immunization not carried out because of patient refusal: Secondary | ICD-10-CM | POA: Diagnosis not present

## 2021-09-27 DIAGNOSIS — K219 Gastro-esophageal reflux disease without esophagitis: Secondary | ICD-10-CM

## 2021-09-27 DIAGNOSIS — Z2882 Immunization not carried out because of caregiver refusal: Secondary | ICD-10-CM | POA: Diagnosis not present

## 2021-09-27 DIAGNOSIS — I10 Essential (primary) hypertension: Secondary | ICD-10-CM

## 2021-09-27 DIAGNOSIS — Z1211 Encounter for screening for malignant neoplasm of colon: Secondary | ICD-10-CM | POA: Diagnosis not present

## 2021-09-27 DIAGNOSIS — E1159 Type 2 diabetes mellitus with other circulatory complications: Secondary | ICD-10-CM | POA: Diagnosis not present

## 2021-09-27 NOTE — Assessment & Plan Note (Signed)
Improved  Ms. Messman is reminded of the importance of commitment to daily physical activity for 30 minutes or more, as able and the need to limit carbohydrate intake to 30 to 60 grams per meal to help with blood sugar control.   The need to take medication as prescribed, test blood sugar as directed, and to call between visits if there is a concern that blood sugar is uncontrolled is also discussed.   Ms. Kube is reminded of the importance of daily foot exam, annual eye examination, and good blood sugar, blood pressure and cholesterol control.  Diabetic Labs Latest Ref Rng & Units 09/20/2021 07/29/2021 06/11/2021 01/24/2021 01/10/2021  HbA1c 4.8 - 5.6 % 7.3(H) - 7.9(H) - 7.4(H)  Microalbumin mg/dL - - - - -  Micro/Creat Ratio 0 - 29 mg/g creat - - - - 5  Chol 100 - 199 mg/dL - - 142 - -  HDL >39 mg/dL - - 45 - -  Calc LDL 0 - 99 mg/dL - - 82 - -  Triglycerides 0 - 149 mg/dL - - 79 - -  Creatinine 0.57 - 1.00 mg/dL 1.30(H) 1.11(H) 1.06(H) 1.22(H) 1.33(H)   BP/Weight 09/27/2021 09/23/2021 08/22/2021 08/02/2021 06/19/2021 03/25/2021 7/34/2876  Systolic BP 811 572 620 - 355 974 163  Diastolic BP 84 78 76 - 86 89 71  Wt. (Lbs) 181.12 183 - 194 192.08 194 191  BMI 35.37 35.74 - 37.89 37.51 37.89 37.3   Foot/eye exam completion dates Latest Ref Rng & Units 04/08/2021 09/11/2020  Eye Exam No Retinopathy Retinopathy(A) -  Foot Form Completion - - Done    Stop metformin

## 2021-09-27 NOTE — Assessment & Plan Note (Signed)
DASH diet and commitment to daily physical activity for a minimum of 30 minutes discussed and encouraged, as a part of hypertension management. The importance of attaining a healthy weight is also discussed.  BP/Weight 09/27/2021 09/23/2021 08/22/2021 08/02/2021 06/19/2021 03/25/2021 9/95/7900  Systolic BP 920 041 593 - 012 379 909  Diastolic BP 84 78 76 - 86 89 71  Wt. (Lbs) 181.12 183 - 194 192.08 194 191  BMI 35.37 35.74 - 37.89 37.51 37.89 37.3     Controlled, no change in medication

## 2021-09-27 NOTE — Progress Notes (Signed)
Kelli Spencer     MRN: [id number]      DOB: 1966/06/19   HPI Kelli Spencer is here for follow up and re-evaluation of chronic medical conditions, medication management and review of any available recent lab and radiology data.  Preventive health is updated, specifically  Cancer screening and Immunization.   Questions or concerns regarding consultations or procedures which the PT has had in the interim are  addressed. The PT denies any adverse reactions to current medications since the last visit.  There are no new concerns.  There are no specific complaints  Denies polyuria, polydipsia, blurred vision , or hypoglycemic episodes.   ROS Denies recent fever or chills. Denies sinus pressure, nasal congestion, ear pain or sore throat. Denies chest congestion, productive cough or wheezing. Denies chest pains, palpitations and leg swelling Denies abdominal pain, nausea, vomiting,diarrhea or constipation.   Denies dysuria, frequency, hesitancy or incontinence. Denies joint pain, swelling and limitation in mobility. Denies headaches, seizures, numbness, or tingling. Denies depression, anxiety or insomnia. Denies skin break down or rash.   PE  BP 129/84   Pulse 84   Resp 18   Ht 5' (1.524 m)   Wt 181 lb 1.9 oz (82.2 kg)   LMP 06/10/1988 (Approximate)   SpO2 94%   BMI 35.37 kg/m   Patient alert and oriented and in no cardiopulmonary distress.  HEENT: No facial asymmetry, EOMI,     Neck supple .  Chest: Clear to auscultation bilaterally.  CVS: S1, S2 no murmurs, no S3.Regular rate.  ABD: Soft non tender.   Ext: No edema  MS: Adequate ROM spine, shoulders, hips and knees.  Skin: Intact, no ulcerations or rash noted.  Psych: Good eye contact, normal affect. Memory intact not anxious or depressed appearing.  CNS: CN 2-12 intact, power,  normal throughout.no focal deficits noted.   Assessment & Plan  Essential hypertension DASH diet and commitment to daily physical  activity for a minimum of 30 minutes discussed and encouraged, as a part of hypertension management. The importance of attaining a healthy weight is also discussed.  BP/Weight 09/27/2021 09/23/2021 08/22/2021 08/02/2021 06/19/2021 03/25/2021 02/10/7352  Systolic BP 299 242 683 - 419 622 297  Diastolic BP 84 78 76 - 86 89 71  Wt. (Lbs) 181.12 183 - 194 192.08 194 191  BMI 35.37 35.74 - 37.89 37.51 37.89 37.3     Controlled, no change in medication   Dyslipidemia, goal LDL below 100 Hyperlipidemia:Low fat diet discussed and encouraged.   Lipid Panel  Lab Results  Component Value Date   CHOL 142 06/11/2021   HDL 45 06/11/2021   LDLCALC 82 06/11/2021   TRIG 79 06/11/2021   CHOLHDL 3.2 06/11/2021     Controlled, no change in medication Updated lab needed at/ before next visit.   Morbid obesity ([place])  Patient re-educated about  the importance of commitment to a  minimum of 150 minutes of exercise per week as able.  The importance of healthy food choices with portion control discussed, as well as eating regularly and within a 12 hour window most days. The need to choose "clean , green" food 50 to 75% of the time is discussed, as well as to make water the primary drink and set a goal of 64 ounces water daily.    Weight /BMI 09/27/2021 09/23/2021 08/02/2021  WEIGHT 181 lb 1.9 oz 183 lb 194 lb  HEIGHT 5\' 0"  5\' 0"  5\' 0"   BMI 35.37 kg/m2 35.74 kg/m2 37.89  kg/m2      Type 2 diabetes mellitus with vascular disease ([place]) Improved  Kelli Spencer is reminded of the importance of commitment to daily physical activity for 30 minutes or more, as able and the need to limit carbohydrate intake to 30 to 60 grams per meal to help with blood sugar control.   The need to take medication as prescribed, test blood sugar as directed, and to call between visits if there is a concern that blood sugar is uncontrolled is also discussed.   Kelli Spencer is reminded of the importance of daily foot exam,  annual eye examination, and good blood sugar, blood pressure and cholesterol control.  Diabetic Labs Latest Ref Rng & Units 09/20/2021 07/29/2021 06/11/2021 01/24/2021 01/10/2021  HbA1c 4.8 - 5.6 % 7.3(H) - 7.9(H) - 7.4(H)  Microalbumin mg/dL - - - - -  Micro/Creat Ratio 0 - 29 mg/g creat - - - - 5  Chol 100 - 199 mg/dL - - 142 - -  HDL >39 mg/dL - - 45 - -  Calc LDL 0 - 99 mg/dL - - 82 - -  Triglycerides 0 - 149 mg/dL - - 79 - -  Creatinine 0.57 - 1.00 mg/dL 1.30(H) 1.11(H) 1.06(H) 1.22(H) 1.33(H)   BP/Weight 09/27/2021 09/23/2021 08/22/2021 08/02/2021 06/19/2021 03/25/2021 3/38/2505  Systolic BP 397 673 419 - 379 024 097  Diastolic BP 84 78 76 - 86 89 71  Wt. (Lbs) 181.12 183 - 194 192.08 194 191  BMI 35.37 35.74 - 37.89 37.51 37.89 37.3   Foot/eye exam completion dates Latest Ref Rng & Units 04/08/2021 09/11/2020  Eye Exam No Retinopathy Retinopathy(A) -  Foot Form Completion - - Done    Stop metformin   GERD (gastroesophageal reflux disease) Controlled, no change in medication   Immunization refused Refusing vaccne dspite re education stating has needle phobia importance of vaccines is again stressed

## 2021-09-27 NOTE — Patient Instructions (Addendum)
F/U mid March after 3/12,re evaluate diabetes, weight and cholesterol , call if you need me before  CONGRATULATION on gREAT improvement in blood suga with change in eating  STOP metformin, test once daily  PLEASE arrange and get cologuard done, past due  Goal for fasting blood sugar ranges from 80 to 120 and 2 hours after any meal or at bedtime should be between 130 to 170.  Fasting lipid, cmp and EGFR , hBA1C and microalb 3/10 or after  It is important that you exercise regularly at least 30 minutes 5 times a week. If you develop chest pain, have severe difficulty breathing, or feel very tired, stop exercising immediately and seek medical attention  Think about what you will eat, plan ahead. Choose " clean, green, fresh or frozen" over canned, processed or packaged foods which are more sugary, salty and fatty. 70 to 75% of food eaten should be vegetables and fruit. Three meals at set times with snacks allowed between meals, but they must be fruit or vegetables. Aim to eat over a 12 hour period , example 7 am to 7 pm, and STOP after  your last meal of the day. Drink water,generally about 64 ounces per day, no other drink is as healthy. Fruit juice is best enjoyed in a healthy way, by EATING the fruit. Thanks for choosing [facility], we consider it a privelige to serve you. Please reconsider vaccines

## 2021-09-27 NOTE — Assessment & Plan Note (Signed)
  Patient re-educated about  the importance of commitment to a  minimum of 150 minutes of exercise per week as able.  The importance of healthy food choices with portion control discussed, as well as eating regularly and within a 12 hour window most days. The need to choose "clean , green" food 50 to 75% of the time is discussed, as well as to make water the primary drink and set a goal of 64 ounces water daily.    Weight /BMI 09/27/2021 09/23/2021 08/02/2021  WEIGHT 181 lb 1.9 oz 183 lb 194 lb  HEIGHT 5\' 0"  5\' 0"  5\' 0"   BMI 35.37 kg/m2 35.74 kg/m2 37.89 kg/m2

## 2021-09-27 NOTE — Assessment & Plan Note (Signed)
Hyperlipidemia:Low fat diet discussed and encouraged.   Lipid Panel  Lab Results  Component Value Date   CHOL 142 06/11/2021   HDL 45 06/11/2021   LDLCALC 82 06/11/2021   TRIG 79 06/11/2021   CHOLHDL 3.2 06/11/2021     Controlled, no change in medication Updated lab needed at/ before next visit.

## 2021-09-27 NOTE — Assessment & Plan Note (Signed)
Refusing vaccne dspite re education stating has needle phobia importance of vaccines is again stressed

## 2021-09-27 NOTE — Assessment & Plan Note (Signed)
Controlled, no change in medication  

## 2021-09-30 ENCOUNTER — Telehealth: Payer: Self-pay | Admitting: Family Medicine

## 2021-09-30 MED ORDER — RYBELSUS 3 MG PO TABS: ORAL_TABLET | ORAL | 3 refills | Status: DC

## 2021-09-30 NOTE — Telephone Encounter (Signed)
Pls call pt , nd advise that after olberation with pharmacist, ho he diabetes is much better because of hjer hard ork, I recommed changing  Tradjentato Rybelsus 3 mg daily due benefit in heart disease, continue other meds as before , but stop tradjenta once she starts this, I am sending it in, pls let me know  her response, I am removing tradjenta Cost should not be a problem if it is refer her to Pharmacist for help pls

## 2021-10-02 NOTE — Telephone Encounter (Signed)
LVM for pt to call the office.

## 2021-10-03 ENCOUNTER — Telehealth: Payer: Self-pay | Admitting: Family Medicine

## 2021-10-03 NOTE — Telephone Encounter (Signed)
Pt called in to return phone call

## 2021-10-03 NOTE — Telephone Encounter (Signed)
Called and notified patient of Dr. Camillia Herter recommendations (see previous TE). Patient verbalized understanding.

## 2021-10-03 NOTE — Telephone Encounter (Signed)
Patient aware of providers recommendations.  

## 2021-10-07 ENCOUNTER — Other Ambulatory Visit: Payer: Self-pay

## 2021-10-12 MED ORDER — LUBIPROSTONE 24 MCG PO CAPS: ORAL_CAPSULE | ORAL | 3 refills | Status: DC

## 2021-10-15 ENCOUNTER — Other Ambulatory Visit: Payer: Self-pay

## 2021-10-15 MED ORDER — MONTELUKAST SODIUM 10 MG PO TABS
10.0000 mg | ORAL_TABLET | Freq: Every day | ORAL | 3 refills | Status: DC
Start: 2021-10-15 — End: 2022-01-06

## 2021-10-24 ENCOUNTER — Telehealth: Payer: Self-pay | Admitting: Gastroenterology

## 2021-10-24 NOTE — Telephone Encounter (Signed)
Received fax from [facility] asking if I would give RX for alternative to Amitiza because not on her formulary. Options of trying to do a P.A. vs writing for [facility] or [facility].   Please ask patient what she would prefer.   Form is on my desk if needed while I'm out.

## 2021-10-29 NOTE — Telephone Encounter (Signed)
Resubmitted PA for Lubiprostone 24 mcg. Awaiting approval/denial.

## 2021-10-30 ENCOUNTER — Other Ambulatory Visit: Payer: Self-pay

## 2021-10-30 MED ORDER — LUBIPROSTONE 24 MCG PO CAPS: 24.0000 ug | ORAL_CAPSULE | Freq: Two times a day (BID) | ORAL | 3 refills | Status: DC

## 2021-10-30 NOTE — Telephone Encounter (Signed)
PA was not needed. [facility] is mailing lubiprostone to pt.

## 2021-12-08 ENCOUNTER — Other Ambulatory Visit: Payer: Self-pay | Admitting: Family Medicine

## 2021-12-13 LAB — HM HEPATITIS C SCREENING LAB: HM Hepatitis Screen: NEGATIVE

## 2021-12-20 ENCOUNTER — Encounter: Payer: Self-pay | Admitting: Family Medicine

## 2021-12-24 ENCOUNTER — Encounter: Payer: Self-pay | Admitting: Internal Medicine

## 2022-01-05 ENCOUNTER — Other Ambulatory Visit: Payer: Self-pay | Admitting: Family Medicine

## 2022-01-07 ENCOUNTER — Other Ambulatory Visit: Payer: Self-pay | Admitting: Family Medicine

## 2022-01-17 ENCOUNTER — Ambulatory Visit: Payer: Medicare Other | Admitting: Gastroenterology

## 2022-01-21 DIAGNOSIS — I10 Essential (primary) hypertension: Secondary | ICD-10-CM | POA: Diagnosis not present

## 2022-01-21 DIAGNOSIS — E1159 Type 2 diabetes mellitus with other circulatory complications: Secondary | ICD-10-CM | POA: Diagnosis not present

## 2022-01-23 LAB — CMP14+EGFR
ALT: 15 IU/L (ref 0–32)
AST: 11 IU/L (ref 0–40)
Albumin/Globulin Ratio: 1.5 (ref 1.2–2.2)
Albumin: 4.5 g/dL (ref 3.8–4.9)
Alkaline Phosphatase: 109 IU/L (ref 44–121)
BUN/Creatinine Ratio: 14 (ref 9–23)
BUN: 17 mg/dL (ref 6–24)
Bilirubin Total: <0.2 mg/dL (ref 0.0–1.2)
CO2: 25 mmol/L (ref 20–29)
Calcium: 10.3 mg/dL — ABNORMAL HIGH (ref 8.7–10.2)
Chloride: 98 mmol/L (ref 96–106)
Creatinine, Ser: 1.23 mg/dL — ABNORMAL HIGH (ref 0.57–1.00)
Globulin, Total: 3.1 g/dL (ref 1.5–4.5)
Glucose: 98 mg/dL (ref 70–99)
Potassium: 4.5 mmol/L (ref 3.5–5.2)
Sodium: 141 mmol/L (ref 134–144)
Total Protein: 7.6 g/dL (ref 6.0–8.5)
eGFR: 51 mL/min/{1.73_m2} — ABNORMAL LOW (ref >59)

## 2022-01-23 LAB — LIPID PANEL
Chol/HDL Ratio: 2.7 ratio (ref 0.0–4.4)
Cholesterol, Total: 117 mg/dL (ref 100–199)
HDL: 43 mg/dL (ref >39)
LDL Chol Calc (NIH): 61 mg/dL (ref 0–99)
Triglycerides: 61 mg/dL (ref 0–149)
VLDL Cholesterol Cal: 13 mg/dL (ref 5–40)

## 2022-01-23 LAB — MICROALBUMIN / CREATININE URINE RATIO
Creatinine, Urine: 99.5 mg/dL
Microalb/Creat Ratio: 4 mg/g creat (ref 0–29)
Microalbumin, Urine: 4 ug/mL

## 2022-01-23 LAB — HEMOGLOBIN A1C
Est. average glucose Bld gHb Est-mCnc: 154 mg/dL
Hgb A1c MFr Bld: 7 % — ABNORMAL HIGH (ref 4.8–5.6)

## 2022-01-24 ENCOUNTER — Other Ambulatory Visit: Payer: Self-pay

## 2022-01-24 ENCOUNTER — Encounter: Payer: Self-pay | Admitting: Family Medicine

## 2022-01-24 ENCOUNTER — Ambulatory Visit (INDEPENDENT_AMBULATORY_CARE_PROVIDER_SITE_OTHER): Payer: Medicare Other | Admitting: Family Medicine

## 2022-01-24 VITALS — BP 120/84 | HR 80 | Ht 60.0 in | Wt 181.0 lb

## 2022-01-24 DIAGNOSIS — I10 Essential (primary) hypertension: Secondary | ICD-10-CM

## 2022-01-24 DIAGNOSIS — Z1231 Encounter for screening mammogram for malignant neoplasm of breast: Secondary | ICD-10-CM | POA: Diagnosis not present

## 2022-01-24 DIAGNOSIS — Z2821 Immunization not carried out because of patient refusal: Secondary | ICD-10-CM | POA: Diagnosis not present

## 2022-01-24 DIAGNOSIS — E559 Vitamin D deficiency, unspecified: Secondary | ICD-10-CM | POA: Diagnosis not present

## 2022-01-24 DIAGNOSIS — E1159 Type 2 diabetes mellitus with other circulatory complications: Secondary | ICD-10-CM | POA: Diagnosis not present

## 2022-01-24 DIAGNOSIS — F322 Major depressive disorder, single episode, severe without psychotic features: Secondary | ICD-10-CM

## 2022-01-24 DIAGNOSIS — E785 Hyperlipidemia, unspecified: Secondary | ICD-10-CM

## 2022-01-24 DIAGNOSIS — M10079 Idiopathic gout, unspecified ankle and foot: Secondary | ICD-10-CM | POA: Diagnosis not present

## 2022-01-24 NOTE — Assessment & Plan Note (Signed)
Improved ? ?Patient re-educated about  the importance of commitment to a  minimum of 150 minutes of exercise per week as able. ? ?The importance of healthy food choices with portion control discussed, as well as eating regularly and within a 12 hour window most days. ?The need to choose "clean , green" food 50 to 75% of the time is discussed, as well as to make water the primary drink and set a goal of 64 ounces water daily. ? ?  ? ?  01/24/2022  ?  8:19 AM 09/27/2021  ? 10:12 AM 09/23/2021  ?  7:49 AM  ?Weight /BMI  ?Weight 181 lb 181 lb 1.9 oz 183 lb  ?Height 5' (1.524 m) 5' (1.524 m) 5' (1.524 m)  ?BMI 35.35 kg/m2 35.37 kg/m2 35.74 kg/m2  ? ? ? ?

## 2022-01-24 NOTE — Assessment & Plan Note (Signed)
[name] is reminded of the importance of commitment to daily physical activity for 30 minutes or more, as able and the need to limit carbohydrate intake to 30 to 60 grams per meal to help with blood sugar control.  ? ?The need to take medication as prescribed, test blood sugar as directed, and to call between visits if there is a concern that blood sugar is uncontrolled is also discussed.  ? ?[name] is reminded of the importance of daily foot exam, annual eye examination, and good blood sugar, blood pressure and cholesterol control. ?Improving , still not at goal ? ? ?  Latest Ref Rng & Units 01/21/2022  ?  8:41 AM 09/20/2021  ? 10:11 AM 07/29/2021  ?  8:35 AM 06/11/2021  ?  9:19 AM 01/24/2021  ?  8:34 AM  ?Diabetic Labs  ?HbA1c 4.8 - 5.6 % 7.0   7.3    7.9     ?Micro/Creat Ratio 0 - 29 mg/g creat 4        ?Chol 100 - 199 mg/dL 117     142     ?HDL >39 mg/dL 43     45     ?Calc LDL 0 - 99 mg/dL 61     82     ?Triglycerides 0 - 149 mg/dL 61     79     ?Creatinine 0.57 - 1.00 mg/dL 1.23   1.30   1.11   1.06   1.22    ? ? ?  01/24/2022  ?  8:19 AM 09/27/2021  ? 10:12 AM 09/23/2021  ?  7:49 AM 08/22/2021  ? 12:47 PM 08/02/2021  ?  1:10 PM 06/19/2021  ?  2:10 PM 03/25/2021  ?  2:24 PM  ?BP/Weight  ?Systolic BP 818 563 149 702  127 126  ?Diastolic BP 84 84 78 76  86 89  ?Wt. (Lbs) 181 181.12 183  194 192.08 194  ?BMI 35.35 kg/m2 35.37 kg/m2 35.74 kg/m2  37.89 kg/m2 37.51 kg/m2 37.89 kg/m2  ? ? ?  Latest Ref Rng & Units 04/08/2021  ?  2:28 PM 09/11/2020  ?  8:30 AM  ?Foot/eye exam completion dates  ?Eye Exam No Retinopathy Retinopathy        ?Foot Form Completion   Done  ?  ? This result is from an external source.  ? ? ? ? ? ?

## 2022-01-24 NOTE — Progress Notes (Signed)
? ?Kelli Spencer     MRN: [id number]      DOB: 1966/05/14 ? ? ?HPI ?Kelli Spencer is here for follow up and re-evaluation of chronic medical conditions, medication management and review of any available recent lab and radiology data.  ?Preventive health is updated, specifically  Cancer screening and Immunization.   ?Questions or concerns regarding consultations or procedures which the PT has had in the interim are  addressed. ?The PT denies any adverse reactions to current medications since the last visit.  ?There are no new concerns.  ?There are no specific complaints  ? ?ROS ?Denies recent fever or chills. ?Denies sinus pressure, nasal congestion, ear pain or sore throat. ?Denies chest congestion, productive cough or wheezing. ?Denies chest pains, palpitations and leg swelling ?Denies abdominal pain, nausea, vomiting,diarrhea or constipation.   ?Denies dysuria, frequency, hesitancy or incontinence. ?Denies joint pain, swelling and limitation in mobility. ?Denies headaches, seizures, numbness, or tingling. ?Denies depression, anxiety or insomnia. ?Denies skin break down or rash. ? ? ?PE ? ?BP 120/84 (BP Location: Left Arm)   Pulse 80   Ht 5' (1.524 m)   Wt 181 lb (82.1 kg)   LMP 06/10/1988 (Approximate)   SpO2 94%   BMI 35.35 kg/m?  ? ?Patient alert and oriented and in no cardiopulmonary distress. ? ?HEENT: No facial asymmetry, EOMI,     Neck supple . ? ?Chest: Clear to auscultation bilaterally. ? ?CVS: S1, S2 no murmurs, no S3.Regular rate. ? ?ABD: Soft non tender.  ? ?Ext: No edema ? ?MS: Adequate ROM spine, shoulders, hips and knees. ? ?Skin: Intact, no ulcerations or rash noted. ? ?Psych: Good eye contact, normal affect. Memory intact not anxious or depressed appearing. ? ?CNS: CN 2-12 intact, power,  normal throughout.no focal deficits noted. ? ? ?Assessment & Plan ? ?Morbid obesity ([place]) ?Improved ? ?Patient re-educated about  the importance of commitment to a  minimum of 150 minutes of exercise per week  as able. ? ?The importance of healthy food choices with portion control discussed, as well as eating regularly and within a 12 hour window most days. ?The need to choose "clean , green" food 50 to 75% of the time is discussed, as well as to make water the primary drink and set a goal of 64 ounces water daily. ? ?  ? ?  01/24/2022  ?  8:19 AM 09/27/2021  ? 10:12 AM 09/23/2021  ?  7:49 AM  ?Weight /BMI  ?Weight 181 lb 181 lb 1.9 oz 183 lb  ?Height 5' (1.524 m) 5' (1.524 m) 5' (1.524 m)  ?BMI 35.35 kg/m2 35.37 kg/m2 35.74 kg/m2  ? ? ? ? ?Type 2 diabetes mellitus with vascular disease ([place]) ?Kelli Spencer is reminded of the importance of commitment to daily physical activity for 30 minutes or more, as able and the need to limit carbohydrate intake to 30 to 60 grams per meal to help with blood sugar control.  ? ?The need to take medication as prescribed, test blood sugar as directed, and to call between visits if there is a concern that blood sugar is uncontrolled is also discussed.  ? ?Kelli Spencer is reminded of the importance of daily foot exam, annual eye examination, and good blood sugar, blood pressure and cholesterol control. ?Improving , still not at goal ? ? ?  Latest Ref Rng & Units 01/21/2022  ?  8:41 AM 09/20/2021  ? 10:11 AM 07/29/2021  ?  8:35 AM 06/11/2021  ?  9:19 AM  01/24/2021  ?  8:34 AM  ?Diabetic Labs  ?HbA1c 4.8 - 5.6 % 7.0   7.3    7.9     ?Micro/Creat Ratio 0 - 29 mg/g creat 4        ?Chol 100 - 199 mg/dL 117     142     ?HDL >39 mg/dL 43     45     ?Calc LDL 0 - 99 mg/dL 61     82     ?Triglycerides 0 - 149 mg/dL 61     79     ?Creatinine 0.57 - 1.00 mg/dL 1.23   1.30   1.11   1.06   1.22    ? ? ?  01/24/2022  ?  8:19 AM 09/27/2021  ? 10:12 AM 09/23/2021  ?  7:49 AM 08/22/2021  ? 12:47 PM 08/02/2021  ?  1:10 PM 06/19/2021  ?  2:10 PM 03/25/2021  ?  2:24 PM  ?BP/Weight  ?Systolic BP 240 973 532 992  127 126  ?Diastolic BP 84 84 78 76  86 89  ?Wt. (Lbs) 181 181.12 183  194 192.08 194  ?BMI 35.35 kg/m2 35.37 kg/m2  35.74 kg/m2  37.89 kg/m2 37.51 kg/m2 37.89 kg/m2  ? ? ?  Latest Ref Rng & Units 04/08/2021  ?  2:28 PM 09/11/2020  ?  8:30 AM  ?Foot/eye exam completion dates  ?Eye Exam No Retinopathy Retinopathy        ?Foot Form Completion   Done  ?  ? This result is from an external source.  ? ? ? ? ? ? ?Dyslipidemia, goal LDL below 100 ?Hyperlipidemia:Low fat diet discussed and encouraged. ? ? ?Lipid Panel  ?Lab Results  ?Component Value Date  ? CHOL 117 01/21/2022  ? HDL 43 01/21/2022  ? [place] 61 01/21/2022  ? TRIG 61 01/21/2022  ? CHOLHDL 2.7 01/21/2022  ?Controlled, no change in medication ? ? ? ? ? ?Depression, major, single episode, severe ([place]) ?Controlled, no change in medication ? ? ?Gout ?No recent flares, stop allopurinol ? ?Immunization refused ?Immunization offered and still refusing ? ?Vitamin D deficiency ?Controlled, no change in medication ? ? ?

## 2022-01-24 NOTE — Assessment & Plan Note (Signed)
Controlled, no change in medication  

## 2022-01-24 NOTE — Assessment & Plan Note (Signed)
Immunization offered and still refusing ?

## 2022-01-24 NOTE — Patient Instructions (Signed)
Annual exam in 6 months, call if you need me sooner ? ?CONGRATS, keep up the great work ? ?Drink  64 ounces water daily please ? ?Increase sleep and monitor head tremor, call if worsens or you get concerned enough to see Neurology which I recommend ? ? ?Please schedule July mammogram at checkout ? ?Stop allopurinol ? ?Non fasting cmp and EGFR, HBA1C, TSH, CBC and vit D 5 to 7 days before next visit ? ?It is important that you exercise regularly at least 30 minutes 5 times a week. If you develop chest pain, have severe difficulty breathing, or feel very tired, stop exercising immediately and seek medical attention  ? ?Reduce sweets  and starchy foods, increase vegetables, colored one, not corn or potato ? ?Thanks for choosing [facility], we consider it a privelige to serve you. ? ?

## 2022-01-24 NOTE — Assessment & Plan Note (Signed)
Hyperlipidemia:Low fat diet discussed and encouraged. ? ? ?Lipid Panel  ?Lab Results  ?Component Value Date  ? CHOL 117 01/21/2022  ? HDL 43 01/21/2022  ? [place] 61 01/21/2022  ? TRIG 61 01/21/2022  ? CHOLHDL 2.7 01/21/2022  ?Controlled, no change in medication ? ? ? ? ?

## 2022-01-24 NOTE — Assessment & Plan Note (Signed)
No recent flares, stop allopurinol ?

## 2022-01-31 ENCOUNTER — Other Ambulatory Visit: Payer: Self-pay | Admitting: Family Medicine

## 2022-02-04 ENCOUNTER — Other Ambulatory Visit: Payer: Self-pay | Admitting: Family Medicine

## 2022-02-09 ENCOUNTER — Other Ambulatory Visit: Payer: Self-pay | Admitting: Family Medicine

## 2022-02-18 ENCOUNTER — Other Ambulatory Visit: Payer: Self-pay | Admitting: Gastroenterology

## 2022-02-18 ENCOUNTER — Encounter: Payer: Self-pay | Admitting: Internal Medicine

## 2022-02-18 ENCOUNTER — Other Ambulatory Visit: Payer: Self-pay | Admitting: Family Medicine

## 2022-02-18 DIAGNOSIS — E785 Hyperlipidemia, unspecified: Secondary | ICD-10-CM

## 2022-02-18 DIAGNOSIS — K219 Gastro-esophageal reflux disease without esophagitis: Secondary | ICD-10-CM

## 2022-03-24 ENCOUNTER — Ambulatory Visit (INDEPENDENT_AMBULATORY_CARE_PROVIDER_SITE_OTHER): Payer: Medicare Other | Admitting: Orthopedic Surgery

## 2022-03-24 ENCOUNTER — Ambulatory Visit (INDEPENDENT_AMBULATORY_CARE_PROVIDER_SITE_OTHER): Payer: Medicare Other

## 2022-03-24 DIAGNOSIS — Z96651 Presence of right artificial knee joint: Secondary | ICD-10-CM

## 2022-03-24 DIAGNOSIS — M1710 Unilateral primary osteoarthritis, unspecified knee: Secondary | ICD-10-CM

## 2022-03-24 NOTE — Progress Notes (Signed)
FOLLOW UP   Encounter Diagnoses  Name Primary?   S/P total knee replacement, right Yes   Primary localized osteoarthritis of knee      Chief Complaint  Patient presents with   Knee Pain    R/ doing good     Dr. Has no complaints with her right or left knee  Her x-ray looks good  Extension is full her flexion is 125 degrees her knee is stable in all planes  She can convert her follow-ups to 2 years for x-ray

## 2022-03-27 ENCOUNTER — Other Ambulatory Visit: Payer: Self-pay | Admitting: Family Medicine

## 2022-04-09 ENCOUNTER — Ambulatory Visit: Payer: Medicare Other | Admitting: Gastroenterology

## 2022-04-09 DIAGNOSIS — E119 Type 2 diabetes mellitus without complications: Secondary | ICD-10-CM | POA: Diagnosis not present

## 2022-04-09 LAB — HM DIABETES EYE EXAM

## 2022-04-27 ENCOUNTER — Other Ambulatory Visit: Payer: Self-pay | Admitting: Family Medicine

## 2022-04-30 ENCOUNTER — Ambulatory Visit (INDEPENDENT_AMBULATORY_CARE_PROVIDER_SITE_OTHER): Payer: Medicare Other | Admitting: Gastroenterology

## 2022-04-30 ENCOUNTER — Encounter: Payer: Self-pay | Admitting: Gastroenterology

## 2022-04-30 VITALS — BP 118/83 | HR 93 | Temp 97.3°F | Ht 60.0 in | Wt 182.2 lb

## 2022-04-30 DIAGNOSIS — Z1211 Encounter for screening for malignant neoplasm of colon: Secondary | ICD-10-CM

## 2022-04-30 DIAGNOSIS — K5900 Constipation, unspecified: Secondary | ICD-10-CM | POA: Diagnosis not present

## 2022-04-30 DIAGNOSIS — K219 Gastro-esophageal reflux disease without esophagitis: Secondary | ICD-10-CM | POA: Diagnosis not present

## 2022-04-30 NOTE — Progress Notes (Signed)
GI Office Note    Referring Provider: Fayrene Helper, MD Primary Care Physician:  Fayrene Helper, MD  Primary Gastroenterologist:Charles K. Abbey Chatters, DO   Chief Complaint   Chief Complaint  Patient presents with   Follow-up    No current issues.    History of Present Illness   Kelli Spencer is a 56 y.o. female presenting today for follow-up.  Last seen in the office in February 2022.  She has a history of GERD, dysphagia, constipation.    Doing well from GI standpoint. Does not need Amitiza as much now, about 2-3 times per week. Metformin seems to keep her going. No melena, brbpr. Intentional weight loss of 15 pounds. Reflux doing good on Aciphex bid. No dysphagia. Denies abdominal pain.   EGD 01/2021: - Z-line regular, 38 cm from the incisors. - Benign-appearing esophageal stenosis. Dilated. - Gastritis. Biopsied. Mild chronic gastritis, no H.pylori - Normal duodenal bulb, first portion of the duodenum and second portion of the duodenum.  Colonoscopy August 2017: Prep not adequate to detect polyps less than 6 mm, redundant left colon, hemorrhoids.  Next colonoscopy August 2022.  Medications   Current Outpatient Medications  Medication Sig Dispense Refill   ACCU-CHEK GUIDE test strip USE TO CHECK BLOOD SUGAR TWICE DAILY 100 strip 0   Accu-Chek Softclix Lancets lancets USE TO TEST blood sugar twice daily  Dx E11.65 100 each 5   aspirin-sod bicarb-citric acid (ALKA-SELTZER) 325 MG TBEF tablet Take 650 mg by mouth at bedtime as needed (indigestion).     blood glucose meter kit and supplies Dispense based on patient and insurance preference. Once daily testing dx e11.9 1 each 0   cetirizine (ZYRTEC) 10 MG tablet Take 10 mg by mouth at bedtime as needed for allergies.     cholecalciferol (VITAMIN D) 400 UNITS TABS tablet Take 800 Units by mouth daily.     escitalopram (LEXAPRO) 10 MG tablet Take 1 tablet (10 mg total) by mouth daily. 30 tablet 3   glipiZIDE  (GLUCOTROL) 10 MG tablet TAKE 1 TABLET BY MOUTH  TWICE DAILY BEFORE MEALS 180 tablet 3   hydrochlorothiazide (MICROZIDE) 12.5 MG capsule TAKE 1 CAPSULE BY MOUTH  DAILY 90 capsule 3   hydrOXYzine (ATARAX/VISTARIL) 50 MG tablet TAKE 1 TABLET BY MOUTH AT  BEDTIME FOR ITCHING OR  ANXIETY 90 tablet 1   lubiprostone (AMITIZA) 24 MCG capsule Take 1 capsule (24 mcg total) by mouth 2 (two) times daily with a meal. 180 capsule 3   pravastatin (PRAVACHOL) 10 MG tablet TAKE 1 TABLET BY MOUTH  DAILY 90 tablet 3   RABEprazole (ACIPHEX) 20 MG tablet TAKE 1 TABLET BY MOUTH  TWICE DAILY BEFORE MEALS 180 tablet 3   RYBELSUS 3 MG TABS TAKE 1 TABLET BY MOUTH EVERY DAY FOR DIABETES 30 tablet 3   tiZANidine (ZANAFLEX) 4 MG capsule Take 1 capsule (4 mg total) by mouth at bedtime as needed for muscle spasms. 30 capsule 0   TRADJENTA 5 MG TABS tablet TAKE 1 TABLET BY MOUTH  DAILY 90 tablet 3   No current facility-administered medications for this visit.    Allergies   Allergies as of 04/30/2022 - Review Complete 04/30/2022  Allergen Reaction Noted   Prednisone Swelling and Other (See Comments) 06/09/2013   Amlodipine Itching and Swelling 11/05/2017   Ace inhibitors Cough 07/04/2016     Past Medical History   Past Medical History:  Diagnosis Date   Arthritis    Asthma  Diabetes mellitus without complication (HCC)    Diastolic hypertension    GERD (gastroesophageal reflux disease)    Hyperlipidemia    Hypertension    Migraines    Obesity    Seizures ([place])    pt states she has seizures only when she gets upset. pt is taking no seizure meds    Past Surgical History   Past Surgical History:  Procedure Laterality Date   ABDOMINAL HYSTERECTOMY  1997   fibroids   BALLOON DILATION N/A 01/25/2021   Procedure: BALLOON DILATION;  Surgeon: Eloise Harman, DO;  [facility];  Service: Endoscopy;  Laterality: N/A;   BIOPSY  01/25/2021   Procedure: BIOPSY;  Surgeon: Eloise Harman, DO;   [facility];  Service: Endoscopy;;   COLONOSCOPY N/A 06/23/2016   Dr. Oneida Alar: prep not adequate to detect polyps less than 35m, redundant left colon. hemorrhoids. next TCS in 5 years.    ESOPHAGOGASTRODUODENOSCOPY N/A 04/02/2018   Dr. fOneida Alar Benign-appearing esophageal stricture status post dilation, multiple benign gastric polyps, mild gastritis no H. pylori.   ESOPHAGOGASTRODUODENOSCOPY (EGD) WITH PROPOFOL N/A 01/25/2021   Procedure: ESOPHAGOGASTRODUODENOSCOPY (EGD) WITH PROPOFOL;  Surgeon: CEloise Harman DO;  [facility];  Service: Endoscopy;  Laterality: N/A;  am appt   KNEE ARTHROSCOPY WITH MEDIAL MENISECTOMY Left 02/09/2014   Procedure: KNEE ARTHROSCOPY WITH MEDIAL MENISECTOMY;  Surgeon: SCarole Civil MD;  Location: [facility];  Service: Orthopedics;  Laterality: Left;   PARTIAL HYSTERECTOMY     SAVORY DILATION N/A 04/02/2018   Procedure: SAVORY DILATION;  Surgeon: FDanie Binder MD;  [facility];  Service: Endoscopy;  Laterality: N/A;   TOTAL KNEE ARTHROPLASTY Right 03/18/2017   Procedure: TOTAL KNEE ARTHROPLASTY;  Surgeon: HCarole Civil MD;  Location: [facility];  Service: Orthopedics;  Laterality: Right;    Past Family History   Family History  Problem Relation Age of Onset   Kidney failure Mother    Diabetes Mother    Hypertension Mother    Stroke Mother    Diabetes Father    Heart disease Father    Hypertension Sister    Colon cancer Neg Hx     Past Social History   Social History   Socioeconomic History   Marital status: Divorced    Spouse name: Not on file   Number of children: 3   Years of education: Not on file   Highest education level: 9th grade  Occupational History   Occupation: disabled  Tobacco Use   Smoking status: Never   Smokeless tobacco: Never  Vaping Use   Vaping Use: Never used  Substance and Sexual Activity   Alcohol use: No   Drug use: No   Sexual activity: Not Currently  Other Topics Concern    Not on file  Social History Narrative   Daughter currently living with her until she can get her own place.    Social Determinants of Health   Financial Resource Strain: Low Risk  (09/23/2021)   Overall Financial Resource Strain (CARDIA)    Difficulty of Paying Living Expenses: Not hard at all  Food Insecurity: No Food Insecurity (09/23/2021)   Hunger Vital Sign    Worried About Running Out of Food in the Last Year: Never true    Ran Out of Food in the Last Year: Never true  Transportation Needs: No Transportation Needs (09/23/2021)   PRAPARE - THydrologist(Medical): No  Lack of Transportation (Non-Medical): No  Physical Activity: Insufficiently Active (09/23/2021)   Exercise Vital Sign    Days of Exercise per Week: 3 days    Minutes of Exercise per Session: 10 min  Stress: No Stress Concern Present (09/20/2020)   [place]    Feeling of Stress : Not at all  Social Connections: Moderately Isolated (09/23/2021)   Social Connection and Isolation Panel [NHANES]    Frequency of Communication with Friends and Family: More than three times a week    Frequency of Social Gatherings with Friends and Family: More than three times a week    Attends Religious Services: More than 4 times per year    Active Member of [employer] or Organizations: No    Attends Archivist Meetings: Never    Marital Status: Divorced  Human resources officer Violence: Not At Risk (09/20/2020)   Humiliation, Afraid, Rape, and Kick questionnaire    Fear of Current or Ex-Partner: No    Emotionally Abused: No    Physically Abused: No    Sexually Abused: No    Review of Systems   General: Negative for anorexia, weight loss, fever, chills, fatigue, weakness. ENT: Negative for hoarseness, difficulty swallowing , nasal congestion. CV: Negative for chest pain, angina, palpitations, dyspnea on exertion, peripheral edema.   Respiratory: Negative for dyspnea at rest, dyspnea on exertion, cough, sputum, wheezing.  GI: See history of present illness. GU:  Negative for dysuria, hematuria, urinary incontinence, urinary frequency, nocturnal urination.  Endo: Negative for unusual weight change.     Physical Exam   BP 118/83 (BP Location: Left Arm, Patient Position: Sitting, Cuff Size: Normal)   Pulse 93   Temp (!) 97.3 F (36.3 C) (Temporal)   Ht 5' (1.524 m)   Wt 182 lb 3.2 oz (82.6 kg)   LMP 06/10/1988 (Approximate)   SpO2 96%   BMI 35.58 kg/m    General: Well-nourished, well-developed in no acute distress.  Eyes: No icterus. Mouth: Oropharyngeal mucosa moist and pink , no lesions erythema or exudate. Lungs: Clear to auscultation bilaterally.  Heart: Regular rate and rhythm, no murmurs rubs or gallops.  Abdomen: Bowel sounds are normal, nontender, nondistended, no hepatosplenomegaly or masses,  no abdominal bruits or hernia , no rebound or guarding.  Rectal: not performed Extremities: No lower extremity edema. No clubbing or deformities. Neuro: Alert and oriented x 4   Skin: Warm and dry, no jaundice.   Psych: Alert and cooperative, normal mood and affect.  Labs   Lab Results  Component Value Date   CREATININE 1.23 (H) 01/21/2022   BUN 17 01/21/2022   NA 141 01/21/2022   K 4.5 01/21/2022   CL 98 01/21/2022   CO2 25 01/21/2022   Lab Results  Component Value Date   WBC 7.7 09/20/2021   HGB 12.8 09/20/2021   HCT 38.8 09/20/2021   MCV 91 09/20/2021   PLT 296 09/20/2021   Lab Results  Component Value Date   ALT 15 01/21/2022   AST 11 01/21/2022   ALKPHOS 109 01/21/2022   BILITOT <0.2 01/21/2022   Lab Results  Component Value Date   TSH 2.080 09/20/2021    Imaging Studies   No results found.  Assessment   *GERD *Constipation *Colon cancer screening. Prep inadequate in 2017 to detect polyps less than 36m. Advised to have 5 year follow up colonoscopy.   PLAN   Continue  Aciphex 20 mg twice daily before meals  for acid reflux.   Continue Amitiza 24 mcg up to 2 times daily as needed for constipation.   Colonoscopy in the near future.  ASA 3.    I have discussed the risks, alternatives, benefits with regards to but not limited to the risk of reaction to medication, bleeding, infection, perforation and the patient is agreeable to proceed. Written consent to be obtained.    Laureen Ochs. Bobby Rumpf, [place], [facility]

## 2022-04-30 NOTE — Patient Instructions (Signed)
Colonoscopy to be scheduled.  Please see separate instructions. Continue Amitiza 24 mcg up to 2 times daily as needed for constipation. Continue Aciphex 20 mg twice daily before meals for acid reflux.

## 2022-05-08 ENCOUNTER — Telehealth: Payer: Self-pay | Admitting: *Deleted

## 2022-05-08 MED ORDER — PEG 3350-KCL-NA BICARB-NACL 420 G PO SOLR
ORAL | 0 refills | Status: DC
Start: 2022-05-08 — End: 2022-06-13

## 2022-05-08 NOTE — Telephone Encounter (Signed)
CALLED PT. Scheduled for TCS with Dr. Abbey Chatters ASA 3 on 8/11 at [facility] will mail prep instructions/pre-op appt. Rx sent to pharmacy  PA done via [facility]. [id number], DOS: Jun 13, 2022 - Sep 11, 2022

## 2022-05-09 ENCOUNTER — Encounter: Payer: Self-pay | Admitting: *Deleted

## 2022-05-22 ENCOUNTER — Other Ambulatory Visit: Payer: Self-pay | Admitting: Family Medicine

## 2022-05-26 ENCOUNTER — Other Ambulatory Visit: Payer: Self-pay | Admitting: Family Medicine

## 2022-06-09 ENCOUNTER — Other Ambulatory Visit: Payer: Self-pay | Admitting: Family Medicine

## 2022-06-09 NOTE — Patient Instructions (Signed)
AZHAR YOGI  06/09/2022     '@PREFPERIOPPHARMACY'$ @   Your procedure is scheduled on  06/13/2022.   Report to [facility] at  0900  A.M.   Call this number if you have problems the morning of surgery:  [phone]   Remember:  Follow the diet and prep instructions given to you by the office.       DO NOT take any medications for diabetes the morning of your procedure.     Take these medicines the morning of surgery with A SIP OF WATER        zyrtec, lexapro, vistaril, aciphex, zanaflex(if needed).     Do not wear jewelry, make-up or nail polish.  Do not wear lotions, powders, or perfumes, or deodorant.  Do not shave 48 hours prior to surgery.  Men may shave face and neck.  Do not bring valuables to the hospital.  [facility] is not responsible for any belongings or valuables.  Contacts, dentures or bridgework may not be worn into surgery.  Leave your suitcase in the car.  After surgery it may be brought to your room.  For patients admitted to the hospital, discharge time will be determined by your treatment team.  Patients discharged the day of surgery will not be allowed to drive home and must  have someone with them for 24 hours.    Special instructions:   DO NOT smoke tobacco or vape for 24 hours before your procedure.  Please read over the following fact sheets that you were given. Anesthesia Post-op Instructions and Care and Recovery After Surgery      Colonoscopy, Adult, Care After The following information offers guidance on how to care for yourself after your procedure. Your health care provider may also give you more specific instructions. If you have problems or questions, contact your health care provider. What can I expect after the procedure? After the procedure, it is common to have: A small amount of blood in your stool for 24 hours after the procedure. Some gas. Mild cramping or bloating of your abdomen. Follow these instructions at  home: Eating and drinking  Drink enough fluid to keep your urine pale yellow. Follow instructions from your health care provider about eating or drinking restrictions. Resume your normal diet as told by your health care provider. Avoid heavy or fried foods that are hard to digest. Activity Rest as told by your health care provider. Avoid sitting for a long time without moving. Get up to take short walks every 1-2 hours. This is important to improve blood flow and breathing. Ask for help if you feel weak or unsteady. Return to your normal activities as told by your health care provider. Ask your health care provider what activities are safe for you. Managing cramping and bloating  Try walking around when you have cramps or feel bloated. If directed, apply heat to your abdomen as told by your health care provider. Use the heat source that your health care provider recommends, such as a moist heat pack or a heating pad. Place a towel between your skin and the heat source. Leave the heat on for 20-30 minutes. Remove the heat if your skin turns bright red. This is especially important if you are unable to feel pain, heat, or cold. You have a greater risk of getting burned. General instructions If you were given a sedative during the procedure, it can affect you for several hours. Do not drive or  operate machinery until your health care provider says that it is safe. For the first 24 hours after the procedure: Do not sign important documents. Do not drink alcohol. Do your regular daily activities at a slower pace than normal. Eat soft foods that are easy to digest. Take over-the-counter and prescription medicines only as told by your health care provider. Keep all follow-up visits. This is important. Contact a health care provider if: You have blood in your stool 2-3 days after the procedure. Get help right away if: You have more than a small spotting of blood in your stool. You have large  blood clots in your stool. You have swelling of your abdomen. You have nausea or vomiting. You have a fever. You have increasing pain in your abdomen that is not relieved with medicine. These symptoms may be an emergency. Get help right away. Call 911. Do not wait to see if the symptoms will go away. Do not drive yourself to the hospital. Summary After the procedure, it is common to have a small amount of blood in your stool. You may also have mild cramping and bloating of your abdomen. If you were given a sedative during the procedure, it can affect you for several hours. Do not drive or operate machinery until your health care provider says that it is safe. Get help right away if you have a lot of blood in your stool, nausea or vomiting, a fever, or increased pain in your abdomen. This information is not intended to replace advice given to you by your health care provider. Make sure you discuss any questions you have with your health care provider. Document Revised: 06/12/2021 Document Reviewed: 06/12/2021 Elsevier Patient Education  [place] After This sheet gives you information about how to care for yourself after your procedure. Your health care provider may also give you more specific instructions. If you have problems or questions, contact your health care provider. What can I expect after the procedure? After the procedure, it is common to have: Tiredness. Forgetfulness about what happened after the procedure. Impaired judgment for important decisions. Nausea or vomiting. Some difficulty with balance. Follow these instructions at home: For the time period you were told by your health care provider:     Rest as needed. Do not participate in activities where you could fall or become injured. Do not drive or use machinery. Do not drink alcohol. Do not take sleeping pills or medicines that cause drowsiness. Do not make important  decisions or sign legal documents. Do not take care of children on your own. Eating and drinking Follow the diet that is recommended by your health care provider. Drink enough fluid to keep your urine pale yellow. If you vomit: Drink water, juice, or soup when you can drink without vomiting. Make sure you have little or no nausea before eating solid foods. General instructions Have a responsible adult stay with you for the time you are told. It is important to have someone help care for you until you are awake and alert. Take over-the-counter and prescription medicines only as told by your health care provider. If you have sleep apnea, surgery and certain medicines can increase your risk for breathing problems. Follow instructions from your health care provider about wearing your sleep device: Anytime you are sleeping, including during daytime naps. While taking prescription pain medicines, sleeping medicines, or medicines that make you drowsy. Avoid smoking. Keep all follow-up visits as told by your health  care provider. This is important. Contact a health care provider if: You keep feeling nauseous or you keep vomiting. You feel light-headed. You are still sleepy or having trouble with balance after 24 hours. You develop a rash. You have a fever. You have redness or swelling around the IV site. Get help right away if: You have trouble breathing. You have new-onset confusion at home. Summary For several hours after your procedure, you may feel tired. You may also be forgetful and have poor judgment. Have a responsible adult stay with you for the time you are told. It is important to have someone help care for you until you are awake and alert. Rest as told. Do not drive or operate machinery. Do not drink alcohol or take sleeping pills. Get help right away if you have trouble breathing, or if you suddenly become confused. This information is not intended to replace advice given to you  by your health care provider. Make sure you discuss any questions you have with your health care provider. Document Revised: 09/24/2021 Document Reviewed: 09/22/2019 Elsevier Patient Education  [place].

## 2022-06-11 ENCOUNTER — Other Ambulatory Visit: Payer: Self-pay | Admitting: Gastroenterology

## 2022-06-11 ENCOUNTER — Encounter (HOSPITAL_COMMUNITY)
Admission: RE | Admit: 2022-06-11 | Discharge: 2022-06-11 | Disposition: A | Discharge status: Home / Self Care | Payer: Medicare Other | Source: Ambulatory Visit | Attending: Internal Medicine | Admitting: Internal Medicine

## 2022-06-11 VITALS — BP 121/74 | HR 72 | Temp 97.8°F | Resp 18 | Ht 60.0 in | Wt 182.1 lb

## 2022-06-11 DIAGNOSIS — E1159 Type 2 diabetes mellitus with other circulatory complications: Secondary | ICD-10-CM | POA: Diagnosis not present

## 2022-06-11 DIAGNOSIS — I10 Essential (primary) hypertension: Secondary | ICD-10-CM | POA: Insufficient documentation

## 2022-06-11 DIAGNOSIS — Z01818 Encounter for other preprocedural examination: Secondary | ICD-10-CM | POA: Diagnosis not present

## 2022-06-11 LAB — BASIC METABOLIC PANEL WITH GFR
Anion gap: 8 (ref 5–15)
BUN: 21 mg/dL — ABNORMAL HIGH (ref 6–20)
CO2: 28 mmol/L (ref 22–32)
Calcium: 9.5 mg/dL (ref 8.9–10.3)
Chloride: 102 mmol/L (ref 98–111)
Creatinine, Ser: 1.12 mg/dL — ABNORMAL HIGH (ref 0.44–1.00)
GFR, Estimated: 57 mL/min — ABNORMAL LOW
Glucose, Bld: 105 mg/dL — ABNORMAL HIGH (ref 70–99)
Potassium: 4.3 mmol/L (ref 3.5–5.1)
Sodium: 138 mmol/L (ref 135–145)

## 2022-06-12 ENCOUNTER — Other Ambulatory Visit: Payer: Self-pay | Admitting: Family Medicine

## 2022-06-13 ENCOUNTER — Ambulatory Visit (HOSPITAL_BASED_OUTPATIENT_CLINIC_OR_DEPARTMENT_OTHER): Payer: Medicare Other | Admitting: Anesthesiology

## 2022-06-13 ENCOUNTER — Encounter (HOSPITAL_COMMUNITY)
Admission: RE | Disposition: A | Discharge status: Home / Self Care | Payer: Self-pay | Source: Home / Self Care | Attending: Internal Medicine

## 2022-06-13 ENCOUNTER — Ambulatory Visit (HOSPITAL_COMMUNITY): Payer: Medicare Other | Admitting: Anesthesiology

## 2022-06-13 ENCOUNTER — Ambulatory Visit (HOSPITAL_COMMUNITY)
Admission: RE | Admit: 2022-06-13 | Discharge: 2022-06-13 | Disposition: A | Discharge status: Home / Self Care | Payer: Medicare Other | Attending: Internal Medicine | Admitting: Internal Medicine

## 2022-06-13 ENCOUNTER — Encounter (HOSPITAL_COMMUNITY): Payer: Self-pay

## 2022-06-13 ENCOUNTER — Other Ambulatory Visit: Payer: Self-pay | Admitting: Family Medicine

## 2022-06-13 DIAGNOSIS — D122 Benign neoplasm of ascending colon: Secondary | ICD-10-CM | POA: Insufficient documentation

## 2022-06-13 DIAGNOSIS — K648 Other hemorrhoids: Secondary | ICD-10-CM | POA: Insufficient documentation

## 2022-06-13 DIAGNOSIS — Z7984 Long term (current) use of oral hypoglycemic drugs: Secondary | ICD-10-CM | POA: Diagnosis not present

## 2022-06-13 DIAGNOSIS — Z1211 Encounter for screening for malignant neoplasm of colon: Secondary | ICD-10-CM | POA: Insufficient documentation

## 2022-06-13 DIAGNOSIS — Z1212 Encounter for screening for malignant neoplasm of rectum: Secondary | ICD-10-CM | POA: Diagnosis not present

## 2022-06-13 DIAGNOSIS — K219 Gastro-esophageal reflux disease without esophagitis: Secondary | ICD-10-CM | POA: Diagnosis not present

## 2022-06-13 DIAGNOSIS — Z139 Encounter for screening, unspecified: Secondary | ICD-10-CM | POA: Diagnosis not present

## 2022-06-13 DIAGNOSIS — J45909 Unspecified asthma, uncomplicated: Secondary | ICD-10-CM | POA: Diagnosis not present

## 2022-06-13 DIAGNOSIS — K635 Polyp of colon: Secondary | ICD-10-CM | POA: Diagnosis not present

## 2022-06-13 DIAGNOSIS — E119 Type 2 diabetes mellitus without complications: Secondary | ICD-10-CM | POA: Insufficient documentation

## 2022-06-13 DIAGNOSIS — I10 Essential (primary) hypertension: Secondary | ICD-10-CM | POA: Insufficient documentation

## 2022-06-13 HISTORY — PX: POLYPECTOMY: SHX5525

## 2022-06-13 HISTORY — PX: COLONOSCOPY WITH PROPOFOL: SHX5780

## 2022-06-13 LAB — GLUCOSE, CAPILLARY: Glucose-Capillary: 118 mg/dL — ABNORMAL HIGH (ref 70–99)

## 2022-06-13 SURGERY — COLONOSCOPY WITH PROPOFOL
Anesthesia: General

## 2022-06-13 MED ORDER — PROPOFOL 10 MG/ML IV BOLUS
INTRAVENOUS | Status: DC | PRN
  Administered 2022-06-13: 40 mg via INTRAVENOUS

## 2022-06-13 MED ORDER — PROPOFOL 500 MG/50ML IV EMUL
INTRAVENOUS | Status: DC | PRN
  Administered 2022-06-13: 200 ug/kg/min via INTRAVENOUS

## 2022-06-13 MED ORDER — LIDOCAINE HCL (CARDIAC) PF 100 MG/5ML IV SOSY
PREFILLED_SYRINGE | INTRAVENOUS | Status: DC | PRN
  Administered 2022-06-13: 50 mg via INTRAVENOUS

## 2022-06-13 MED ORDER — LACTATED RINGERS IV SOLN
INTRAVENOUS | Status: DC
Start: 2022-06-13 — End: 2022-06-13

## 2022-06-13 NOTE — H&P (Signed)
Primary Care Physician:  Fayrene Helper, MD Primary Gastroenterologist:  Dr. Abbey Chatters  Pre-Procedure History & Physical: HPI:  Kelli Spencer is a 56 y.o. female is here for a colonoscopy for colon cancer screening purposes.  Patient denies any family history of colorectal cancer.  No melena or hematochezia.  No abdominal pain or unintentional weight loss.  No change in bowel habits.  Overall feels well from a GI standpoint.  Past Medical History:  Diagnosis Date   Arthritis    Asthma    Diabetes mellitus without complication (HCC)    Diastolic hypertension    GERD (gastroesophageal reflux disease)    Hyperlipidemia    Hypertension    Migraines    Obesity    Seizures ([place])    pt states she has seizures only when she gets upset. pt is taking no seizure meds    Past Surgical History:  Procedure Laterality Date   ABDOMINAL HYSTERECTOMY  1997   fibroids   BALLOON DILATION N/A 01/25/2021   Procedure: BALLOON DILATION;  Surgeon: Eloise Harman, DO;  [facility];  Service: Endoscopy;  Laterality: N/A;   BIOPSY  01/25/2021   Procedure: BIOPSY;  Surgeon: Eloise Harman, DO;  [facility];  Service: Endoscopy;;   COLONOSCOPY N/A 06/23/2016   Dr. Oneida Alar: prep not adequate to detect polyps less than 64m, redundant left colon. hemorrhoids. next TCS in 5 years.    ESOPHAGOGASTRODUODENOSCOPY N/A 04/02/2018   Dr. fOneida Alar Benign-appearing esophageal stricture status post dilation, multiple benign gastric polyps, mild gastritis no H. pylori.   ESOPHAGOGASTRODUODENOSCOPY (EGD) WITH PROPOFOL N/A 01/25/2021   Benign-appearing esophageal stenosis status post dilation, gastritis with no H. pylori.  Procedure: ESOPHAGOGASTRODUODENOSCOPY (EGD) WITH PROPOFOL;  Surgeon: CEloise Harman DO;  [facility];  Service: Endoscopy;  Laterality: N/A;  am appt   KNEE ARTHROSCOPY WITH MEDIAL MENISECTOMY Left 02/09/2014   Procedure: KNEE ARTHROSCOPY WITH MEDIAL  MENISECTOMY;  Surgeon: SCarole Civil MD;  Location: [facility];  Service: Orthopedics;  Laterality: Left;   PARTIAL HYSTERECTOMY     SAVORY DILATION N/A 04/02/2018   Procedure: SAVORY DILATION;  Surgeon: FDanie Binder MD;  [facility];  Service: Endoscopy;  Laterality: N/A;   TOTAL KNEE ARTHROPLASTY Right 03/18/2017   Procedure: TOTAL KNEE ARTHROPLASTY;  Surgeon: HCarole Civil MD;  Location: [facility];  Service: Orthopedics;  Laterality: Right;    Prior to Admission medications   Medication Sig Start Date End Date Taking? Authorizing Provider  ACCU-CHEK GUIDE test strip USE TO CHECK BLOOD SUGAR TWICE DAILY 06/12/22  Yes SFayrene Helper MD  Accu-Chek Softclix Lancets lancets USE TO TEST blood sugar twice daily  Dx E11.65 01/13/19  Yes SFayrene Helper MD  aspirin-sod bicarb-citric acid (ALKA-SELTZER) 325 MG TBEF tablet Take 650 mg by mouth at bedtime as needed (indigestion).   Yes [provider]  blood glucose meter kit and supplies Dispense based on patient and insurance preference. Once daily testing dx e11.9 11/08/19  Yes SFayrene Helper MD  cetirizine (ZYRTEC) 10 MG tablet Take 10 mg by mouth at bedtime as needed for allergies.   Yes [provider]  cholecalciferol (VITAMIN D) 400 UNITS TABS tablet Take 800 Units by mouth daily.   Yes [provider]  escitalopram (LEXAPRO) 10 MG tablet Take 1 tablet (10 mg total) by mouth daily. 06/19/21  Yes SFayrene Helper MD  glipiZIDE (GLUCOTROL) 10 MG tablet TAKE 1 TABLET BY MOUTH  TWICE DAILY BEFORE MEALS 05/23/22  Yes Fayrene Helper, MD  hydrochlorothiazide (MICROZIDE) 12.5 MG capsule TAKE 1 CAPSULE BY MOUTH  DAILY 05/23/22  Yes Fayrene Helper, MD  hydrOXYzine (ATARAX/VISTARIL) 50 MG tablet TAKE 1 TABLET BY MOUTH AT  BEDTIME FOR ITCHING OR  ANXIETY 01/14/21  Yes Fayrene Helper, MD  lubiprostone (AMITIZA) 24 MCG capsule TAKE 1 CAPSULE BY MOUTH TWICE  DAILY WITH MEALS 06/12/22   Yes Annitta Needs, NP  polyethylene glycol-electrolytes (NULYTELY) 420 g solution As directed 05/08/22  Yes Lance Galas K, DO  pravastatin (PRAVACHOL) 10 MG tablet TAKE 1 TABLET BY MOUTH  DAILY 02/18/22  Yes Fayrene Helper, MD  RABEprazole (ACIPHEX) 20 MG tablet TAKE 1 TABLET BY MOUTH  TWICE DAILY BEFORE MEALS 02/18/22  Yes Mahon, Lenise Arena, NP  RYBELSUS 3 MG TABS TAKE 1 TABLET BY MOUTH EVERY DAY FOR DIABETES 03/27/22  Yes Fayrene Helper, MD  tiZANidine (ZANAFLEX) 4 MG capsule Take 1 capsule (4 mg total) by mouth at bedtime as needed for muscle spasms. 06/05/20  Yes Fayrene Helper, MD  TRADJENTA 5 MG TABS tablet TAKE 1 TABLET BY MOUTH  DAILY 12/09/21  Yes Fayrene Helper, MD    Allergies as of 05/08/2022 - Review Complete 04/30/2022  Allergen Reaction Noted   Prednisone Swelling and Other (See Comments) 06/09/2013   Amlodipine Itching and Swelling 11/05/2017   Ace inhibitors Cough 07/04/2016    Family History  Problem Relation Age of Onset   Kidney failure Mother    Diabetes Mother    Hypertension Mother    Stroke Mother    Diabetes Father    Heart disease Father    Hypertension Sister    Colon cancer Neg Hx     Social History   Socioeconomic History   Marital status: Divorced    Spouse name: Not on file   Number of children: 3   Years of education: Not on file   Highest education level: 9th grade  Occupational History   Occupation: disabled  Tobacco Use   Smoking status: Never   Smokeless tobacco: Never  Vaping Use   Vaping Use: Never used  Substance and Sexual Activity   Alcohol use: No   Drug use: No   Sexual activity: Not Currently  Other Topics Concern   Not on file  Social History Narrative   Daughter currently living with her until she can get her own place.    Social Determinants of Health   Financial Resource Strain: Low Risk  (09/23/2021)   Overall Financial Resource Strain (CARDIA)    Difficulty of Paying Living Expenses: Not hard at  all  Food Insecurity: No Food Insecurity (09/23/2021)   Hunger Vital Sign    Worried About Running Out of Food in the Last Year: Never true    Ran Out of Food in the Last Year: Never true  Transportation Needs: No Transportation Needs (09/23/2021)   PRAPARE - Hydrologist (Medical): No    Lack of Transportation (Non-Medical): No  Physical Activity: Insufficiently Active (09/23/2021)   Exercise Vital Sign    Days of Exercise per Week: 3 days    Minutes of Exercise per Session: 10 min  Stress: No Stress Concern Present (09/20/2020)   [place]    Feeling of Stress : Not at all  Social Connections: Moderately Isolated (09/23/2021)   Social Connection and Isolation Panel [NHANES]  Frequency of Communication with Friends and Family: More than three times a week    Frequency of Social Gatherings with Friends and Family: More than three times a week    Attends Religious Services: More than 4 times per year    Active Member of [employer] or Organizations: No    Attends Archivist Meetings: Never    Marital Status: Divorced  Human resources officer Violence: Not At Risk (09/20/2020)   Humiliation, Afraid, Rape, and Kick questionnaire    Fear of Current or Ex-Partner: No    Emotionally Abused: No    Physically Abused: No    Sexually Abused: No    Review of Systems: See HPI, otherwise negative ROS  Physical Exam: Vital signs in last 24 hours: Temp:  [97.8 F (36.6 C)] 97.8 F (36.6 C) (08/11 0949) Resp:  [18] 18 (08/11 0949) BP: (122)/(85) 122/85 (08/11 0949) SpO2:  [100 %] 100 % (08/11 0949)   General:   Alert,  Well-developed, well-nourished, pleasant and cooperative in NAD Head:  Normocephalic and atraumatic. Eyes:  Sclera clear, no icterus.   Conjunctiva pink. Ears:  Normal auditory acuity. Nose:  No deformity, discharge,  or lesions. Mouth:  No deformity or lesions, dentition  normal. Neck:  Supple; no masses or thyromegaly. Lungs:  Clear throughout to auscultation.   No wheezes, crackles, or rhonchi. No acute distress. Heart:  Regular rate and rhythm; no murmurs, clicks, rubs,  or gallops. Abdomen:  Soft, nontender and nondistended. No masses, hepatosplenomegaly or hernias noted. Normal bowel sounds, without guarding, and without rebound.   Msk:  Symmetrical without gross deformities. Normal posture. Extremities:  Without clubbing or edema. Neurologic:  Alert and  oriented x4;  grossly normal neurologically. Skin:  Intact without significant lesions or rashes. Cervical Nodes:  No significant cervical adenopathy. Psych:  Alert and cooperative. Normal mood and affect.  Impression/Plan: Kelli Spencer is here for a colonoscopy to be performed for colon cancer screening purposes.  The risks of the procedure including infection, bleed, or perforation as well as benefits, limitations, alternatives and imponderables have been reviewed with the patient. Questions have been answered. All parties agreeable.

## 2022-06-13 NOTE — Discharge Instructions (Signed)
  Colonoscopy Discharge Instructions  Read the instructions outlined below and refer to this sheet in the next few weeks. These discharge instructions provide you with general information on caring for yourself after you leave the hospital. Your doctor may also give you specific instructions. While your treatment has been planned according to the most current medical practices available, unavoidable complications occasionally occur.   ACTIVITY You may resume your regular activity, but move at a slower pace for the next 24 hours.  Take frequent rest periods for the next 24 hours.  Walking will help get rid of the air and reduce the bloated feeling in your belly (abdomen).  No driving for 24 hours (because of the medicine (anesthesia) used during the test).   Do not sign any important legal documents or operate any machinery for 24 hours (because of the anesthesia used during the test).  NUTRITION Drink plenty of fluids.  You may resume your normal diet as instructed by your doctor.  Begin with a light meal and progress to your normal diet. Heavy or fried foods are harder to digest and may make you feel sick to your stomach (nauseated).  Avoid alcoholic beverages for 24 hours or as instructed.  MEDICATIONS You may resume your normal medications unless your doctor tells you otherwise.  WHAT YOU CAN EXPECT TODAY Some feelings of bloating in the abdomen.  Passage of more gas than usual.  Spotting of blood in your stool or on the toilet paper.  IF YOU HAD POLYPS REMOVED DURING THE COLONOSCOPY: No aspirin products for 7 days or as instructed.  No alcohol for 7 days or as instructed.  Eat a soft diet for the next 24 hours.  FINDING OUT THE RESULTS OF YOUR TEST Not all test results are available during your visit. If your test results are not back during the visit, make an appointment with your caregiver to find out the results. Do not assume everything is normal if you have not heard from your  caregiver or the medical facility. It is important for you to follow up on all of your test results.  SEEK IMMEDIATE MEDICAL ATTENTION IF: You have more than a spotting of blood in your stool.  Your belly is swollen (abdominal distention).  You are nauseated or vomiting.  You have a temperature over 101.  You have abdominal pain or discomfort that is severe or gets worse throughout the day.   Your colonoscopy revealed 2 polyp(s) which I removed successfully. Await pathology results, my office will contact you. I recommend repeating colonoscopy in 5-10 years for surveillance purposes depending on pathology results. Otherwise follow up with GI as needed.    I hope you have a great rest of your week!  Elon Alas. Abbey Chatters, D.O. Gastroenterology and Hepatology [facility]

## 2022-06-13 NOTE — Transfer of Care (Signed)
Immediate Anesthesia Transfer of Care Note  Patient: Kelli Spencer  Procedure(s) Performed: COLONOSCOPY WITH PROPOFOL POLYPECTOMY  Patient Location: PACU  Anesthesia Type:MAC  Level of Consciousness: awake and patient cooperative  Airway & Oxygen Therapy: Patient Spontanous Breathing  Post-op Assessment: Report given to RN and Post -op Vital signs reviewed and stable  Post vital signs: Reviewed and stable  Last Vitals:  Vitals Value Taken Time  BP 110/60 06/13/22 1114  Temp 36.5 C 06/13/22 1114  Pulse 79 06/13/22 1114  Resp 23 06/13/22 1114  SpO2 95 % 06/13/22 1114    Last Pain:  Vitals:   06/13/22 1114  TempSrc: Oral  PainSc: 0-No pain      Patients Stated Pain Goal: 9 (53/61/44 3154)  Complications: No notable events documented.

## 2022-06-13 NOTE — Op Note (Signed)
[facility] Patient Name: Kelli Spencer Procedure Date: 06/13/2022 10:40 AM MRN: [id number] Date of Birth: May 02, 1966 Attending MD: Elon Alas. Edgar Frisk CSN: [id number] Age: 56 Admit Type: Outpatient Procedure:                Colonoscopy Indications:              Screening for colorectal malignant neoplasm Providers:                Elon Alas. Abbey Chatters, DO, Caprice Kluver, [place]                            Risa Grill, Technician, Bethel Born,                            Merchant navy officer Referring MD:              Medicines:                See the Anesthesia note for documentation of the                            administered medications Complications:            No immediate complications. Estimated Blood Loss:     Estimated blood loss was minimal. Procedure:                Pre-Anesthesia Assessment:                           - The anesthesia plan was to use monitored                            anesthesia care (MAC).                           After obtaining informed consent, the colonoscope                            was passed under direct vision. Throughout the                            procedure, the patient's blood pressure, pulse, and                            oxygen saturations were monitored continuously. The                            PCF-HQ190L ([id number]) scope was introduced through                            the anus and advanced to the the cecum, identified                            by appendiceal orifice and ileocecal valve. The                            colonoscopy was performed without difficulty. The  patient tolerated the procedure well. The quality                            of the bowel preparation was evaluated using the                            BBPS [facility] Bowel Preparation Scale) with scores                            of: Right Colon = 3, Transverse Colon = 3 and Left                            Colon = 3 (entire mucosa  seen well with no residual                            staining, small fragments of stool or opaque                            liquid). The total BBPS score equals 9. Scope In: 10:57:49 AM Scope Out: 11:09:26 AM Scope Withdrawal Time: 0 hours 8 minutes 50 seconds  Total Procedure Duration: 0 hours 11 minutes 37 seconds  Findings:      The perianal and digital rectal examinations were normal.      Non-bleeding internal hemorrhoids were found during endoscopy.      Two sessile polyps were found in the ascending colon. The polyps were 3       to 4 mm in size. These polyps were removed with a cold snare. Resection       and retrieval were complete.      The exam was otherwise without abnormality. Impression:               - Non-bleeding internal hemorrhoids.                           - Two 3 to 4 mm polyps in the ascending colon,                            removed with a cold snare. Resected and retrieved.                           - The examination was otherwise normal. Moderate Sedation:      Per Anesthesia Care Recommendation:           - Patient has a contact number available for                            emergencies. The signs and symptoms of potential                            delayed complications were discussed with the                            patient. Return to normal activities tomorrow.  Written discharge instructions were provided to the                            patient.                           - Resume previous diet.                           - Continue present medications.                           - Await pathology results.                           - Repeat colonoscopy in 5-10 years for surveillance.                           - Return to GI clinic PRN. Procedure Code(s):        --- Professional ---                           [phone], Colonoscopy, flexible; with removal of                            tumor(s), polyp(s), or other lesion(s) by  snare                            technique Diagnosis Code(s):        --- Professional ---                           Z12.11, Encounter for screening for malignant                            neoplasm of colon                           K63.5, Polyp of colon                           K64.8, Other hemorrhoids CPT copyright 2019 American Medical Association. All rights reserved. The codes documented in this report are preliminary and upon coder review may  be revised to meet current compliance requirements. Elon Alas. Abbey Chatters, DO [place] Abbey Chatters, DO 06/13/2022 11:16:10 AM This report has been signed electronically. Number of Addenda: 0

## 2022-06-13 NOTE — Anesthesia Postprocedure Evaluation (Signed)
Anesthesia Post Note  Patient: Kelli Spencer  Procedure(s) Performed: COLONOSCOPY WITH PROPOFOL POLYPECTOMY  Patient location during evaluation: Phase II Anesthesia Type: General Level of consciousness: awake and alert and oriented Pain management: pain level controlled Vital Signs Assessment: post-procedure vital signs reviewed and stable Respiratory status: spontaneous breathing, nonlabored ventilation and respiratory function stable Cardiovascular status: blood pressure returned to baseline and stable Postop Assessment: no apparent nausea or vomiting Anesthetic complications: no   No notable events documented.   Last Vitals:  Vitals:   06/13/22 0949 06/13/22 1114  BP: 122/85 110/60  Pulse:  79  Resp: 18 (!) 23  Temp: 36.6 C 36.5 C  SpO2: 100% 95%    Last Pain:  Vitals:   06/13/22 1114  TempSrc: Oral  PainSc: 0-No pain                 Tobie Perdue C [place]

## 2022-06-13 NOTE — Anesthesia Preprocedure Evaluation (Signed)
Anesthesia Evaluation  Patient identified by MRN, date of birth, ID band Patient awake    Reviewed: Allergy & Precautions, NPO status , Patient's Chart, lab work & pertinent test results  Airway Mallampati: II  TM Distance: >3 FB Neck ROM: Full    Dental  (+) Dental Advisory Given, Missing   Pulmonary asthma ,    Pulmonary exam normal breath sounds clear to auscultation       Cardiovascular hypertension, Pt. on medications Normal cardiovascular exam Rhythm:Regular Rate:Normal     Neuro/Psych  Headaches, Seizures - (pt states she has seizures only when she gets upset. pt is taking no seizure meds),  PSYCHIATRIC DISORDERS Depression    GI/Hepatic Neg liver ROS, GERD  Medicated and Controlled,  Endo/Other  diabetes, Well Controlled, Type 2, Oral Hypoglycemic Agents  Renal/GU Renal disease  negative genitourinary   Musculoskeletal  (+) Arthritis , Osteoarthritis,    Abdominal   Peds negative pediatric ROS (+)  Hematology negative hematology ROS (+)   Anesthesia Other Findings   Reproductive/Obstetrics negative OB ROS                             Anesthesia Physical Anesthesia Plan  ASA: 2  Anesthesia Plan: General   Post-op Pain Management: Minimal or no pain anticipated   Induction: Intravenous  PONV Risk Score and Plan: Propofol infusion  Airway Management Planned: Nasal Cannula and Natural Airway  Additional Equipment:   Intra-op Plan:   Post-operative Plan:   Informed Consent: I have reviewed the patients History and Physical, chart, labs and discussed the procedure including the risks, benefits and alternatives for the proposed anesthesia with the patient or authorized representative who has indicated his/her understanding and acceptance.     Dental advisory given  Plan Discussed with: CRNA and Surgeon  Anesthesia Plan Comments:         Anesthesia Quick  Evaluation

## 2022-06-16 LAB — SURGICAL PATHOLOGY

## 2022-06-18 ENCOUNTER — Encounter (HOSPITAL_COMMUNITY): Payer: Self-pay | Admitting: Internal Medicine

## 2022-08-01 DIAGNOSIS — E559 Vitamin D deficiency, unspecified: Secondary | ICD-10-CM | POA: Diagnosis not present

## 2022-08-01 DIAGNOSIS — E1159 Type 2 diabetes mellitus with other circulatory complications: Secondary | ICD-10-CM | POA: Diagnosis not present

## 2022-08-02 LAB — CBC
Hematocrit: 38.9 % (ref 34.0–46.6)
Hemoglobin: 12.8 g/dL (ref 11.1–15.9)
MCH: 29.2 pg (ref 26.6–33.0)
MCHC: 32.9 g/dL (ref 31.5–35.7)
MCV: 89 fL (ref 79–97)
Platelets: 324 10*3/uL (ref 150–450)
RBC: 4.38 x10E6/uL (ref 3.77–5.28)
RDW: 12.4 % (ref 11.7–15.4)
WBC: 6.8 10*3/uL (ref 3.4–10.8)

## 2022-08-02 LAB — CMP14+EGFR
ALT: 13 IU/L (ref 0–32)
AST: 11 IU/L (ref 0–40)
Albumin/Globulin Ratio: 1.4 (ref 1.2–2.2)
Albumin: 4.4 g/dL (ref 3.8–4.9)
Alkaline Phosphatase: 101 IU/L (ref 44–121)
BUN/Creatinine Ratio: 18 (ref 9–23)
BUN: 24 mg/dL (ref 6–24)
Bilirubin Total: 0.4 mg/dL (ref 0.0–1.2)
CO2: 24 mmol/L (ref 20–29)
Calcium: 10.5 mg/dL — ABNORMAL HIGH (ref 8.7–10.2)
Chloride: 100 mmol/L (ref 96–106)
Creatinine, Ser: 1.31 mg/dL — ABNORMAL HIGH (ref 0.57–1.00)
Globulin, Total: 3.2 g/dL (ref 1.5–4.5)
Glucose: 87 mg/dL (ref 70–99)
Potassium: 4.2 mmol/L (ref 3.5–5.2)
Sodium: 142 mmol/L (ref 134–144)
Total Protein: 7.6 g/dL (ref 6.0–8.5)
eGFR: 48 mL/min/{1.73_m2} — ABNORMAL LOW (ref >59)

## 2022-08-02 LAB — TSH: TSH: 2.79 u[IU]/mL (ref 0.450–4.500)

## 2022-08-02 LAB — HEMOGLOBIN A1C
Est. average glucose Bld gHb Est-mCnc: 148 mg/dL
Hgb A1c MFr Bld: 6.8 % — ABNORMAL HIGH (ref 4.8–5.6)

## 2022-08-02 LAB — VITAMIN D 25 HYDROXY (VIT D DEFICIENCY, FRACTURES): Vit D, 25-Hydroxy: 67.5 ng/mL (ref 30.0–100.0)

## 2022-08-08 ENCOUNTER — Ambulatory Visit (INDEPENDENT_AMBULATORY_CARE_PROVIDER_SITE_OTHER): Payer: Medicare Other | Admitting: Family Medicine

## 2022-08-08 ENCOUNTER — Ambulatory Visit (HOSPITAL_COMMUNITY)
Admission: RE | Admit: 2022-08-08 | Discharge: 2022-08-08 | Disposition: A | Discharge status: Home / Self Care | Payer: Medicare Other | Source: Ambulatory Visit | Attending: Family Medicine | Admitting: Family Medicine

## 2022-08-08 ENCOUNTER — Encounter: Payer: Self-pay | Admitting: Family Medicine

## 2022-08-08 ENCOUNTER — Other Ambulatory Visit: Payer: Self-pay

## 2022-08-08 ENCOUNTER — Other Ambulatory Visit: Payer: Self-pay | Admitting: Family Medicine

## 2022-08-08 VITALS — BP 120/72 | HR 84 | Ht 60.0 in | Wt 182.0 lb

## 2022-08-08 DIAGNOSIS — Z1231 Encounter for screening mammogram for malignant neoplasm of breast: Secondary | ICD-10-CM

## 2022-08-08 DIAGNOSIS — Z0001 Encounter for general adult medical examination with abnormal findings: Secondary | ICD-10-CM | POA: Insufficient documentation

## 2022-08-08 DIAGNOSIS — K219 Gastro-esophageal reflux disease without esophagitis: Secondary | ICD-10-CM

## 2022-08-08 DIAGNOSIS — E785 Hyperlipidemia, unspecified: Secondary | ICD-10-CM

## 2022-08-08 DIAGNOSIS — E1159 Type 2 diabetes mellitus with other circulatory complications: Secondary | ICD-10-CM

## 2022-08-08 DIAGNOSIS — I10 Essential (primary) hypertension: Secondary | ICD-10-CM

## 2022-08-08 MED ORDER — RABEPRAZOLE SODIUM 20 MG PO TBEC: DELAYED_RELEASE_TABLET | ORAL | 3 refills | Status: DC

## 2022-08-08 NOTE — Patient Instructions (Signed)
F/U first week in March, call if you need me sooner  Nurse pls see if aciphex is available at local pharmacy, if not we need to contact her mail order pharmacy to see what similar medication, PPI that is in stock for reflux,  needs med  Please send for e ye exam Dr Jorja Loa and enter result 06/2022 per pt  Reconsider vaccines please  Increase water intake  It is important that you exercise regularly at least 30 minutes 5 times a week. If you develop chest pain, have severe difficulty breathing, or feel very tired, stop exercising immediately and seek medical attention   Fasting lipid, cmp and EGFr and hBA1C 5 days before next appt  Please schedule mammogram at checkout  Thanks for choosing [facility], we consider it a privelige to serve you.

## 2022-08-08 NOTE — Progress Notes (Signed)
Kelli Spencer     MRN: [id number]      DOB: [birth year]  HPI: Patient is in for annual physical exam. Needs reflux med. Recent labs,  are reviewed.still refusingupdated if needed.   PE: BP 120/72 (BP Location: Right Arm, Patient Position: Sitting)   Pulse 84   Ht 5' (1.524 m)   Wt 182 lb (82.6 kg)   LMP 06/10/1988 (Approximate)   SpO2 95%   BMI 35.54 kg/m   Pleasant  female, alert and oriented x 3, in no cardio-pulmonary distress. Afebrile. HEENT No facial trauma or asymetry. Sinuses non tender.  Extra occullar muscles intact.. External ears normal, . Neck: supple, no adenopathy,JVD or thyromegaly.No bruits.  Chest: Clear to ascultation bilaterally.No crackles or wheezes. Non tender to palpation  Cardiovascular system; Heart sounds normal,  S1 and  S2 ,no S3.  No murmur, or thrill. Apical beat not displaced Peripheral pulses normal.  Abdomen: Soft, non tender, no organomegaly or masses. No bruits. Bowel sounds normal. No guarding, tenderness or rebound.    Musculoskeletal exam: Adequate  ROM of spine, hips , shoulders and knees. Swelling of right knee noted. No muscle wasting or atrophy.   Neurologic: Cranial nerves 2 to 12 intact. Power, tone ,sensation and reflexes normal throughout. No disturbance in gait. No tremor.  Skin: Intact, no ulceration, erythema , scaling or rash noted. Pigmentation normal throughout  Psych; Normal mood and affect. Judgement and concentration normal   Assessment & Plan:  Encounter for Medicare annual examination with abnormal findings Annual exam as documented. Counseling done  re healthy lifestyle involving commitment to 150 minutes exercise per week, heart healthy diet, and attaining healthy weight.The importance of adequate sleep also discussed. Changes in health habits are decided on by the patient with goals and time frames  set for achieving them. Immunization and cancer screening needs are specifically  addressed at this visit.   Type 2 diabetes mellitus with vascular disease ([place]) Ms. Utley is reminded of the importance of commitment to daily physical activity for 30 minutes or more, as able and the need to limit carbohydrate intake to 30 to 60 grams per meal to help with blood sugar control.   The need to take medication as prescribed, test blood sugar as directed, and to call between visits if there is a concern that blood sugar is uncontrolled is also discussed.   Ms. Crosby is reminded of the importance of daily foot exam, annual eye examination, and good blood sugar, blood pressure and cholesterol control.     Latest Ref Rng & Units 08/01/2022    9:01 AM 06/11/2022    9:20 AM 01/21/2022    8:41 AM 09/20/2021   10:11 AM 07/29/2021    8:35 AM  Diabetic Labs  HbA1c 4.8 - 5.6 % 6.8   7.0  7.3    Micro/Creat Ratio 0 - 29 mg/g creat   4     Chol 100 - 199 mg/dL   117     HDL >39 mg/dL   43     Calc LDL 0 - 99 mg/dL   61     Triglycerides 0 - 149 mg/dL   61     Creatinine 0.57 - 1.00 mg/dL 1.31  1.12  1.23  1.30  1.11       08/08/2022    8:12 AM 06/13/2022   11:14 AM 06/13/2022    9:49 AM 06/11/2022    8:59 AM 04/30/2022    2:13 PM 01/24/2022  8:19 AM 09/27/2021   10:12 AM  BP/Weight  Systolic BP 811 886 773 736 681 594 707  Diastolic BP 72 60 85 74 83 84 84  Wt. (Lbs) 182   182.1 182.2 181 181.12  BMI 35.54 kg/m2   35.56 kg/m2 35.58 kg/m2 35.35 kg/m2 35.37 kg/m2      Latest Ref Rng & Units 08/08/2022    8:00 AM 04/08/2021    2:28 PM  Foot/eye exam completion dates  Eye Exam No Retinopathy  Retinopathy      Foot Form Completion  Done      This result is from an external source.

## 2022-08-08 NOTE — Assessment & Plan Note (Signed)
Ms. Casebier is reminded of the importance of commitment to daily physical activity for 30 minutes or more, as able and the need to limit carbohydrate intake to 30 to 60 grams per meal to help with blood sugar control.   The need to take medication as prescribed, test blood sugar as directed, and to call between visits if there is a concern that blood sugar is uncontrolled is also discussed.   Ms. Hoppes is reminded of the importance of daily foot exam, annual eye examination, and good blood sugar, blood pressure and cholesterol control.     Latest Ref Rng & Units 08/01/2022    9:01 AM 06/11/2022    9:20 AM 01/21/2022    8:41 AM 09/20/2021   10:11 AM 07/29/2021    8:35 AM  Diabetic Labs  HbA1c 4.8 - 5.6 % 6.8   7.0  7.3    Micro/Creat Ratio 0 - 29 mg/g creat   4     Chol 100 - 199 mg/dL   117     HDL >39 mg/dL   43     Calc LDL 0 - 99 mg/dL   61     Triglycerides 0 - 149 mg/dL   61     Creatinine 0.57 - 1.00 mg/dL 1.31  1.12  1.23  1.30  1.11       08/08/2022    8:12 AM 06/13/2022   11:14 AM 06/13/2022    9:49 AM 06/11/2022    8:59 AM 04/30/2022    2:13 PM 01/24/2022    8:19 AM 09/27/2021   10:12 AM  BP/Weight  Systolic BP 643 838 184 037 543 606 770  Diastolic BP 72 60 85 74 83 84 84  Wt. (Lbs) 182   182.1 182.2 181 181.12  BMI 35.54 kg/m2   35.56 kg/m2 35.58 kg/m2 35.35 kg/m2 35.37 kg/m2      Latest Ref Rng & Units 08/08/2022    8:00 AM 04/08/2021    2:28 PM  Foot/eye exam completion dates  Eye Exam No Retinopathy  Retinopathy      Foot Form Completion  Done      This result is from an external source.

## 2022-08-08 NOTE — Assessment & Plan Note (Signed)
Annual exam as documented. Counseling done  re healthy lifestyle involving commitment to 150 minutes exercise per week, heart healthy diet, and attaining healthy weight.The importance of adequate sleep also discussed. Changes in health habits are decided on by the patient with goals and time frames  set for achieving them. Immunization and cancer screening needs are specifically addressed at this visit. 

## 2022-08-17 ENCOUNTER — Other Ambulatory Visit: Payer: Self-pay | Admitting: Family Medicine

## 2022-09-29 ENCOUNTER — Ambulatory Visit (INDEPENDENT_AMBULATORY_CARE_PROVIDER_SITE_OTHER): Payer: Medicare Other

## 2022-09-29 DIAGNOSIS — Z0000 Encounter for general adult medical examination without abnormal findings: Secondary | ICD-10-CM | POA: Diagnosis not present

## 2022-09-29 NOTE — Patient Instructions (Signed)
  Kelli Spencer , Thank you for taking time to come for your Medicare Wellness Visit. I appreciate your ongoing commitment to your health goals. Please review the following plan we discussed and let me know if I can assist you in the future.   These are the goals we discussed:  Goals      DIET - EAT MORE FRUITS AND VEGETABLES     Follow the low purine diet      HEMOGLOBIN A1C < 7     Increase physical activity     Try to walk 30 minutes after dinner 2 evenings a week      Patient Stated     Patient states that she doesn't currently have any goals just to get better and better.        This is a list of the screening recommended for you and due dates:  Health Maintenance  Topic Date Due   COVID-19 Vaccine (1) Never done   Zoster (Shingles) Vaccine (1 of 2) Never done   Eye exam for diabetics  04/08/2022   Flu Shot  02/01/2023*   Stool Blood Test  02/07/2023*   Yearly kidney health urinalysis for diabetes  01/22/2023   Hemoglobin A1C  01/30/2023   Yearly kidney function blood test for diabetes  08/02/2023   Complete foot exam   08/09/2023   Medicare Annual Wellness Visit  09/30/2023   Pap Smear  06/19/2024   Mammogram  08/08/2024   Colon Cancer Screening  06/13/2032   Hepatitis C Screening: USPSTF Recommendation to screen - Ages 18-79 yo.  Completed   HIV Screening  Completed   HPV Vaccine  Aged Out  *Topic was postponed. The date shown is not the original due date.

## 2022-09-29 NOTE — Progress Notes (Signed)
Subjective:   Kelli Spencer is a 56 y.o. female who presents for Medicare Annual (Subsequent) preventive examination. I connected with  Kelli Spencer on 09/29/22 by a audio enabled telemedicine application and verified that I am speaking with the correct person using two identifiers.  Patient Location: Home  Provider Location: Office/Clinic  I discussed the limitations of evaluation and management by telemedicine. The patient expressed understanding and agreed to proceed.  Review of Systems           Objective:    There were no vitals filed for this visit. There is no height or weight on file to calculate BMI.     06/11/2022    8:52 AM 09/23/2021    8:02 AM 01/25/2021    9:45 AM 09/20/2020    3:07 PM 12/27/2018    9:21 AM 04/02/2018    9:42 AM 12/16/2017    9:37 AM  Advanced Directives  Does Patient Have a Medical Advance Directive? _0  No No  Would patient like information on creating a medical advance directive? No - Patient declined Yes (ED - Information included in AVS) Yes (MAU/Ambulatory/Procedural Areas - Information given) No - Patient declined Yes (ED - Information included in AVS) Yes (MAU/Ambulatory/Procedural Areas - Information given) Yes (ED - Information included in AVS)    Current Medications (verified) Outpatient Encounter Medications as of 09/29/2022  Medication Sig   ACCU-CHEK GUIDE test strip USE TO CHECK BLOOD SUGAR TWICE DAILY   Accu-Chek Softclix Lancets lancets USE TO TEST blood sugar twice daily  Dx E11.65   aspirin-sod bicarb-citric acid (ALKA-SELTZER) 325 MG TBEF tablet Take 650 mg by mouth at bedtime as needed (indigestion).   blood glucose meter kit and supplies Dispense based on patient and insurance preference. Once daily testing dx e11.9   cetirizine (ZYRTEC) 10 MG tablet Take 10 mg by mouth at bedtime as needed for allergies.   cholecalciferol (VITAMIN D) 400 UNITS TABS tablet Take 800 Units by mouth daily.   glipiZIDE  (GLUCOTROL) 10 MG tablet TAKE 1 TABLET BY MOUTH  TWICE DAILY BEFORE MEALS   hydrochlorothiazide (MICROZIDE) 12.5 MG capsule TAKE 1 CAPSULE BY MOUTH  DAILY   hydrOXYzine (ATARAX/VISTARIL) 50 MG tablet TAKE 1 TABLET BY MOUTH AT  BEDTIME FOR ITCHING OR  ANXIETY   lubiprostone (AMITIZA) 24 MCG capsule TAKE 1 CAPSULE BY MOUTH TWICE  DAILY WITH MEALS   pravastatin (PRAVACHOL) 10 MG tablet TAKE 1 TABLET BY MOUTH  DAILY   RABEprazole (ACIPHEX) 20 MG tablet TAKE 1 TABLET BY MOUTH  TWICE DAILY BEFORE MEALS   RYBELSUS 3 MG TABS TAKE 1 TABLET BY MOUTH EVERY DAY FOR DIABETES   tiZANidine (ZANAFLEX) 4 MG capsule Take 1 capsule (4 mg total) by mouth at bedtime as needed for muscle spasms.   TRADJENTA 5 MG TABS tablet TAKE 1 TABLET BY MOUTH DAILY   No facility-administered encounter medications on file as of 09/29/2022.    Allergies (verified) Prednisone, Amlodipine, and Ace inhibitors   History: Past Medical History:  Diagnosis Date   Arthritis    Asthma    Diabetes mellitus without complication (HCC)    Diastolic hypertension    GERD (gastroesophageal reflux disease)    Hyperlipidemia    Hypertension    Migraines    Obesity    Seizures ([place])    pt states she has seizures only when she gets upset. pt is taking no seizure meds   Past Surgical History:  Procedure Laterality  Date   ABDOMINAL HYSTERECTOMY  1997   fibroids   BALLOON DILATION N/A 01/25/2021   Procedure: BALLOON DILATION;  Surgeon: Eloise Harman, DO;  [facility];  Service: Endoscopy;  Laterality: N/A;   BIOPSY  01/25/2021   Procedure: BIOPSY;  Surgeon: Eloise Harman, DO;  [facility];  Service: Endoscopy;;   COLONOSCOPY N/A 06/23/2016   Dr. Oneida Alar: prep not adequate to detect polyps less than 61m, redundant left colon. hemorrhoids. next TCS in 5 years.    COLONOSCOPY WITH PROPOFOL N/A 06/13/2022   Procedure: COLONOSCOPY WITH PROPOFOL;  Surgeon: CEloise Harman DO;  [facility];   Service: Endoscopy;  Laterality: N/A;  11:00am, asa 3   ESOPHAGOGASTRODUODENOSCOPY N/A 04/02/2018   Dr. fOneida Alar Benign-appearing esophageal stricture status post dilation, multiple benign gastric polyps, mild gastritis no H. pylori.   ESOPHAGOGASTRODUODENOSCOPY (EGD) WITH PROPOFOL N/A 01/25/2021   Benign-appearing esophageal stenosis status post dilation, gastritis with no H. pylori.  Procedure: ESOPHAGOGASTRODUODENOSCOPY (EGD) WITH PROPOFOL;  Surgeon: CEloise Harman DO;  [facility];  Service: Endoscopy;  Laterality: N/A;  am appt   KNEE ARTHROSCOPY WITH MEDIAL MENISECTOMY Left 02/09/2014   Procedure: KNEE ARTHROSCOPY WITH MEDIAL MENISECTOMY;  Surgeon: SCarole Civil MD;  Location: [facility];  Service: Orthopedics;  Laterality: Left;   PARTIAL HYSTERECTOMY     POLYPECTOMY  06/13/2022   Procedure: POLYPECTOMY;  Surgeon: CEloise Harman DO;  [facility];  Service: Endoscopy;;   SAVORY DILATION N/A 04/02/2018   Procedure: SAVORY DILATION;  Surgeon: FDanie Binder MD;  [facility];  Service: Endoscopy;  Laterality: N/A;   TOTAL KNEE ARTHROPLASTY Right 03/18/2017   Procedure: TOTAL KNEE ARTHROPLASTY;  Surgeon: HCarole Civil MD;  Location: [facility];  Service: Orthopedics;  Laterality: Right;   Family History  Problem Relation Age of Onset   Kidney failure Mother    Diabetes Mother    Hypertension Mother    Stroke Mother    Diabetes Father    Heart disease Father    Hypertension Sister    Colon cancer Neg Hx    Social History   Socioeconomic History   Marital status: Divorced    Spouse name: Not on file   Number of children: 3   Years of education: Not on file   Highest education level: 9th grade  Occupational History   Occupation: disabled  Tobacco Use   Smoking status: Never   Smokeless tobacco: Never  Vaping Use   Vaping Use: Never used  Substance and Sexual Activity   Alcohol use: No   Drug use: No   Sexual activity: Not  Currently  Other Topics Concern   Not on file  Social History Narrative   Daughter currently living with her until she can get her own place.    Social Determinants of Health   Financial Resource Strain: Low Risk  (09/23/2021)   Overall Financial Resource Strain (CARDIA)    Difficulty of Paying Living Expenses: Not hard at all  Food Insecurity: No Food Insecurity (09/23/2021)   Hunger Vital Sign    Worried About Running Out of Food in the Last Year: Never true    Ran Out of Food in the Last Year: Never true  Transportation Needs: No Transportation Needs (09/23/2021)   PRAPARE - THydrologist(Medical): No    Lack of Transportation (Non-Medical): No  Physical Activity: Insufficiently Active (09/23/2021)   Exercise  Vital Sign    Days of Exercise per Week: 3 days    Minutes of Exercise per Session: 10 min  Stress: No Stress Concern Present (09/20/2020)   [place]    Feeling of Stress : Not at all  Social Connections: Moderately Isolated (09/23/2021)   Social Connection and Isolation Panel [NHANES]    Frequency of Communication with Friends and Family: More than three times a week    Frequency of Social Gatherings with Friends and Family: More than three times a week    Attends Religious Services: More than 4 times per year    Active Member of [employer] or Organizations: No    Attends Archivist Meetings: Never    Marital Status: Divorced    Tobacco Counseling Counseling given: Not Answered   Clinical Intake:                 Diabetic?yes      Nutrition Risk Assessment:  Has the patient had any N/V/D within the last 2 months?  No  Does the patient have any non-healing wounds?  No  Has the patient had any unintentional weight loss or weight gain?  No   Diabetes:  Is the patient diabetic?  Yes  If diabetic, was a CBG obtained today?  No  Did the patient bring  in their glucometer from home?  No  How often do you monitor your CBG's? daily.   Financial Strains and Diabetes Management:  Are you having any financial strains with the device, your supplies or your medication? No .  Does the patient want to be seen by Chronic Care Management for management of their diabetes?  No  Would the patient like to be referred to a Nutritionist or for Diabetic Management?  No   Diabetic Exams:  Diabetic Eye Exam: Overdue for diabetic eye exam. Pt has been advised about the importance in completing this exam. Patient advised to call and schedule an eye exam. Diabetic Foot Exam: Completed 08/08/22     Activities of Daily Living    06/11/2022    8:51 AM  In your present state of health, do you have any difficulty performing the following activities:  Hearing? 0  Vision? 0  Difficulty concentrating or making decisions? 0  Walking or climbing stairs? 0  Dressing or bathing? 0    Patient Care Team: Fayrene Helper, MD as PCP - General (Family Medicine) Carole Civil, MD as Consulting Physician (Orthopedic Surgery) Arlana Lindau, RD as Dietitian (Nutrition) Eloise Harman, DO as Consulting Physician (Internal Medicine)  Indicate any recent Medical Services you may have received from other than [facility] providers in the past year (date may be approximate).     Assessment:   This is a routine wellness examination for Kathi.  Hearing/Vision screen No results found.  Dietary issues and exercise activities discussed:     Goals Addressed   None    Depression Screen    08/08/2022    8:14 AM 01/24/2022    8:21 AM 09/27/2021   10:14 AM 09/23/2021    8:00 AM 08/22/2021   12:48 PM 08/02/2021    1:11 PM 06/19/2021    2:30 PM  PHQ 2/9 Scores  PHQ - 2 Score 0 0 0 0 0 0 6  PHQ- 9 Score   0  0 0 17    Fall Risk    08/08/2022    8:14 AM 01/24/2022  8:21 AM 09/27/2021   10:14 AM 09/23/2021    8:00 AM 08/22/2021   12:47 PM  [place] in the past year? 0 0 0 0 0  Number falls in past yr: 0 0 0 0   Injury with Fall? 0 0 0 0   Risk for fall due to : No Fall Risks No Fall Risks     Follow up Falls evaluation completed Falls evaluation completed       [place]:  Any stairs in or around the home? Yes  If so, are there any without handrails? No  Home free of loose throw rugs in walkways, pet beds, electrical cords, etc? Yes  Adequate lighting in your home to reduce risk of falls? Yes   ASSISTIVE DEVICES UTILIZED TO PREVENT FALLS:  Life alert? Yes  Use of a cane, walker or w/c? No  Grab bars in the bathroom? No  Shower chair or bench in shower? No  Elevated toilet seat or a handicapped toilet? No   TIMED UP AND GO:  Was the test performed? No .  Length of time to ambulate 10 feet:  sec.     Cognitive Function:        09/23/2021    8:03 AM 09/20/2020    3:13 PM 12/27/2018    9:23 AM  6CIT Screen  What Year? 0 points 0 points 0 points  What month? 0 points 0 points 0 points  What time? 0 points 0 points 0 points  Count back from 20 0 points 0 points 0 points  Months in reverse 0 points 0 points 0 points  Repeat phrase 0 points 0 points 0 points  Total Score 0 points 0 points 0 points    Immunizations  There is no immunization history on file for this patient.  TDAP status: Up to date  Flu Vaccine status: Due, Education has been provided regarding the importance of this vaccine. Advised may receive this vaccine at local pharmacy or Health Dept. Aware to provide a copy of the vaccination record if obtained from local pharmacy or Health Dept. Verbalized acceptance and understanding.  Pneumococcal vaccine status: Up to date  Covid-19 vaccine status: Information provided on how to obtain vaccines.   Qualifies for Shingles Vaccine? Yes   Zostavax completed No   Shingrix Completed?: No.    Education has been provided regarding the importance of this vaccine.  Patient has been advised to call insurance company to determine out of pocket expense if they have not yet received this vaccine. Advised may also receive vaccine at local pharmacy or Health Dept. Verbalized acceptance and understanding.  Screening Tests Health Maintenance  Topic Date Due   COVID-19 Vaccine (1) Never done   Zoster Vaccines- Shingrix (1 of 2) Never done   OPHTHALMOLOGY EXAM  04/08/2022   Medicare Annual Wellness (AWV)  09/23/2022   INFLUENZA VACCINE  02/01/2023 (Originally 06/03/2022)   COLON CANCER SCREENING ANNUAL FOBT  02/07/2023 (Originally 08/07/2020)   Diabetic kidney evaluation - Urine ACR  01/22/2023   HEMOGLOBIN A1C  01/30/2023   Diabetic kidney evaluation - GFR measurement  08/02/2023   FOOT EXAM  08/09/2023   PAP SMEAR-Modifier  06/19/2024   MAMMOGRAM  08/08/2024   COLONOSCOPY (Pts 45-96yr Insurance coverage will need to be confirmed)  06/13/2032   Hepatitis C Screening  Completed   HIV Screening  Completed   HPV VACCINES  Aged Out    Health Maintenance  Health Maintenance Due  Topic Date Due   COVID-19 Vaccine (1) Never done   Zoster Vaccines- Shingrix (1 of 2) Never done   OPHTHALMOLOGY EXAM  04/08/2022   Medicare Annual Wellness (AWV)  09/23/2022    Colorectal cancer screening: Type of screening: Colonoscopy. Completed 06/13/22. Repeat every 10 years  Mammogram status: Completed 08/08/22. Repeat every year    Lung Cancer Screening: (Low Dose CT Chest recommended if Age 21-80 years, 30 pack-year currently smoking OR have quit w/in 15years.) does not qualify.   Lung Cancer Screening Referral:   Additional Screening:  Hepatitis C Screening: does not qualify; Completed 12/13/21  Vision Screening: Recommended annual ophthalmology exams for early detection of glaucoma and other disorders of the eye. Is the patient up to date with their annual eye exam?  No  Who is the provider or what is the name of the office in which the patient attends annual eye  exams? [facility] If pt is not established with a provider, would they like to be referred to a provider to establish care? No .   Dental Screening: Recommended annual dental exams for proper oral hygiene  Community Resource Referral / Chronic Care Management: CRR required this visit?  No   CCM required this visit?  No      Plan:     I have personally reviewed and noted the following in the patient's chart:   Medical and social history Use of alcohol, tobacco or illicit drugs  Current medications and supplements including opioid prescriptions. Patient is not currently taking opioid prescriptions. Functional ability and status Nutritional status Physical activity Advanced directives List of other physicians Hospitalizations, surgeries, and ER visits in previous 12 months Vitals Screenings to include cognitive, depression, and falls Referrals and appointments  In addition, I have reviewed and discussed with patient certain preventive protocols, quality metrics, and best practice recommendations. A written personalized care plan for preventive services as well as general preventive health recommendations were provided to patient.     Jill Side, [place]   09/29/2022   Nurse Notes:

## 2022-10-30 ENCOUNTER — Other Ambulatory Visit: Payer: Self-pay | Admitting: Family Medicine

## 2022-11-22 ENCOUNTER — Other Ambulatory Visit: Payer: Self-pay | Admitting: Family Medicine

## 2022-11-22 DIAGNOSIS — E785 Hyperlipidemia, unspecified: Secondary | ICD-10-CM

## 2023-01-01 ENCOUNTER — Encounter: Payer: Self-pay | Admitting: Radiology

## 2023-01-05 ENCOUNTER — Other Ambulatory Visit (HOSPITAL_COMMUNITY)
Admission: RE | Admit: 2023-01-05 | Discharge: 2023-01-05 | Disposition: A | Discharge status: Home / Self Care | Payer: 59 | Source: Ambulatory Visit | Attending: Family Medicine | Admitting: Family Medicine

## 2023-01-05 DIAGNOSIS — I10 Essential (primary) hypertension: Secondary | ICD-10-CM | POA: Diagnosis not present

## 2023-01-05 DIAGNOSIS — E785 Hyperlipidemia, unspecified: Secondary | ICD-10-CM | POA: Diagnosis not present

## 2023-01-05 DIAGNOSIS — E1159 Type 2 diabetes mellitus with other circulatory complications: Secondary | ICD-10-CM | POA: Diagnosis not present

## 2023-01-05 LAB — COMPREHENSIVE METABOLIC PANEL
ALT: 16 U/L (ref 0–44)
AST: 18 U/L (ref 15–41)
Albumin: 4.2 g/dL (ref 3.5–5.0)
Alkaline Phosphatase: 94 U/L (ref 38–126)
Anion gap: 11 (ref 5–15)
BUN: 19 mg/dL (ref 6–20)
CO2: 27 mmol/L (ref 22–32)
Calcium: 9.6 mg/dL (ref 8.9–10.3)
Chloride: 100 mmol/L (ref 98–111)
Creatinine, Ser: 1.17 mg/dL — ABNORMAL HIGH (ref 0.44–1.00)
GFR, Estimated: 54 mL/min — ABNORMAL LOW (ref >60)
Glucose, Bld: 96 mg/dL (ref 70–99)
Potassium: 4.1 mmol/L (ref 3.5–5.1)
Sodium: 138 mmol/L (ref 135–145)
Total Bilirubin: 0.7 mg/dL (ref 0.3–1.2)
Total Protein: 8.3 g/dL — ABNORMAL HIGH (ref 6.5–8.1)

## 2023-01-05 LAB — LIPID PANEL
Cholesterol: 118 mg/dL (ref 0–200)
HDL: 42 mg/dL (ref >40)
LDL Cholesterol: 66 mg/dL (ref 0–99)
Total CHOL/HDL Ratio: 2.8 RATIO
Triglycerides: 52 mg/dL (ref <150)
VLDL: 10 mg/dL (ref 0–40)

## 2023-01-06 LAB — HEMOGLOBIN A1C
Hgb A1c MFr Bld: 7.2 % — ABNORMAL HIGH (ref 4.8–5.6)
Mean Plasma Glucose: 160 mg/dL

## 2023-01-09 ENCOUNTER — Ambulatory Visit (INDEPENDENT_AMBULATORY_CARE_PROVIDER_SITE_OTHER): Payer: 59 | Admitting: Family Medicine

## 2023-01-09 ENCOUNTER — Encounter: Payer: Self-pay | Admitting: Family Medicine

## 2023-01-09 VITALS — BP 128/84 | HR 76 | Resp 12 | Ht 60.0 in | Wt 188.0 lb

## 2023-01-09 DIAGNOSIS — E1159 Type 2 diabetes mellitus with other circulatory complications: Secondary | ICD-10-CM | POA: Diagnosis not present

## 2023-01-09 DIAGNOSIS — E785 Hyperlipidemia, unspecified: Secondary | ICD-10-CM | POA: Diagnosis not present

## 2023-01-09 DIAGNOSIS — K219 Gastro-esophageal reflux disease without esophagitis: Secondary | ICD-10-CM

## 2023-01-09 DIAGNOSIS — I10 Essential (primary) hypertension: Secondary | ICD-10-CM

## 2023-01-09 MED ORDER — METFORMIN HCL 500 MG PO TABS: 500.0000 mg | ORAL_TABLET | Freq: Every day | ORAL | 3 refills | Status: DC

## 2023-01-09 NOTE — Patient Instructions (Addendum)
F/U in 4 months, call if you need me sooner  Stop tradjenta for diabetes  Start metformin 500 mg one daily  Stop pineapple and all fruit juices, eat the fruit and drink only water  Excellent cholesterol  Non fasting chem 7 and EGFr, HBA1C and microalb approx 3 to 7 days before next appt  You are referred to GI re uncontrolled reflux, change to decaf coffee  It is important that you exercise regularly at least 30 minutes 5 times a week. If you develop chest pain, have severe difficulty breathing, or feel very tired, stop exercising immediately and seek medical attention  Weight loss goal of 6 pounds   Thanks for choosing [facility], we consider it a privelige to serve you.

## 2023-01-15 ENCOUNTER — Encounter: Payer: Self-pay | Admitting: Family Medicine

## 2023-01-15 NOTE — Assessment & Plan Note (Signed)
Hyperlipidemia:Low fat diet discussed and encouraged.   Lipid Panel  Lab Results  Component Value Date   CHOL 118 01/05/2023   HDL 42 01/05/2023   LDLCALC 66 01/05/2023   TRIG 52 01/05/2023   CHOLHDL 2.8 01/05/2023     Controlled, no change in medication

## 2023-01-15 NOTE — Assessment & Plan Note (Signed)
Kelli Spencer is reminded of the importance of commitment to daily physical activity for 30 minutes or more, as able and the need to limit carbohydrate intake to 30 to 60 grams per meal to help with blood sugar control.   The need to take medication as prescribed, test blood sugar as directed, and to call between visits if there is a concern that blood sugar is uncontrolled is also discussed.   Kelli Spencer is reminded of the importance of daily foot exam, annual eye examination, and good blood sugar, blood pressure and cholesterol control. Deteriorated and not at goal, stop rtradjenta resume metformin     Latest Ref Rng & Units 01/05/2023    9:19 AM 08/01/2022    9:01 AM 06/11/2022    9:20 AM 01/21/2022    8:41 AM 09/20/2021   10:11 AM  Diabetic Labs  HbA1c 4.8 - 5.6 % 7.2  6.8   7.0  7.3   Micro/Creat Ratio 0 - 29 mg/g creat    4    Chol 0 - 200 mg/dL 118    117    HDL >40 mg/dL 42    43    Calc LDL 0 - 99 mg/dL 66    61    Triglycerides <150 mg/dL 52    61    Creatinine 0.44 - 1.00 mg/dL 1.17  1.31  1.12  1.23  1.30       01/09/2023    8:41 AM 01/09/2023    8:23 AM 08/08/2022    8:12 AM 06/13/2022   11:14 AM 06/13/2022    9:49 AM 06/11/2022    8:59 AM 04/30/2022    2:13 PM  BP/Weight  Systolic BP 0000000 0000000 [id number]  Diastolic BP 84 91 72 60 85 74 83  Wt. (Lbs)  188.04 182   182.1 182.2  BMI  36.72 kg/m2 35.54 kg/m2   35.56 kg/m2 35.58 kg/m2      Latest Ref Rng & Units 08/08/2022    8:00 AM 04/09/2022   12:00 AM  Foot/eye exam completion dates  Eye Exam No Retinopathy  No Retinopathy      Foot Form Completion  Done      This result is from an external source.

## 2023-01-15 NOTE — Addendum Note (Signed)
Addended by: Eual Fines on: 01/15/2023 08:35 AM   Modules accepted: Orders

## 2023-01-15 NOTE — Assessment & Plan Note (Signed)
  Patient re-educated about  the importance of commitment to a  minimum of 150 minutes of exercise per week as able.  The importance of healthy food choices with portion control discussed, as well as eating regularly and within a 12 hour window most days. The need to choose "clean , green" food 50 to 75% of the time is discussed, as well as to make water the primary drink and set a goal of 64 ounces water daily.       01/09/2023    8:23 AM 08/08/2022    8:12 AM 06/11/2022    8:59 AM  Weight /BMI  Weight 188 lb 0.6 oz 182 lb 182 lb 1.6 oz  Height 5' (1.524 m) 5' (1.524 m) 5' (1.524 m)  BMI 36.72 kg/m2 35.54 kg/m2 35.56 kg/m2

## 2023-01-15 NOTE — Progress Notes (Signed)
Kelli Spencer     MRN: [id number]      DOB: Jan 29, 1966   HPI Kelli Spencer is here for follow up and re-evaluation of chronic medical conditions, medication management and review of any available recent lab and radiology data.  Preventive health is updated, specifically  Cancer screening and Immunization.   Questions or concerns regarding consultations or procedures which the PT has had in the interim are  addressed. The PT denies any adverse reactions to current medications since the last visit.  C/o heartburn and has at times mild dysphagia ROS Denies recent fever or chills. Denies sinus pressure, nasal congestion, ear pain or sore throat. Denies chest congestion, productive cough or wheezing. Denies chest pains, palpitations and leg swelling    Denies dysuria, frequency, hesitancy or incontinence. Denies joint pain, swelling and limitation in mobility. Denies headaches, seizures, numbness, or tingling. Denies depression, anxiety or insomnia. Denies skin break down or rash.   PE  BP 128/84   Pulse 76   Resp 12   Ht 5' (1.524 m)   Wt 188 lb 0.6 oz (85.3 kg)   LMP 06/10/1988 (Approximate)   SpO2 94%   BMI 36.72 kg/m   Patient alert and oriented and in no cardiopulmonary distress.  HEENT: No facial asymmetry, EOMI,     Neck supple .  Chest: Clear to auscultation bilaterally.  CVS: S1, S2 no murmurs, no S3.Regular rate.  ABD: Soft non tender.   Ext: No edema  MS: Adequate ROM spine, shoulders, hips and knees.  Skin: Intact, no ulcerations or rash noted.  Psych: Good eye contact, normal affect. Memory intact not anxious or depressed appearing.  CNS: CN 2-12 intact, power,  normal throughout.no focal deficits noted.   Assessment & Plan  Essential hypertension Controlled, no change in medication DASH diet and commitment to daily physical activity for a minimum of 30 minutes discussed and encouraged, as a part of hypertension management. The importance of  attaining a healthy weight is also discussed.     01/09/2023    8:41 AM 01/09/2023    8:23 AM 08/08/2022    8:12 AM 06/13/2022   11:14 AM 06/13/2022    9:49 AM 06/11/2022    8:59 AM 04/30/2022    2:13 PM  BP/Weight  Systolic BP 0000000 0000000 [id number]  Diastolic BP 84 91 72 60 85 74 83  Wt. (Lbs)  188.04 182   182.1 182.2  BMI  36.72 kg/m2 35.54 kg/m2   35.56 kg/m2 35.58 kg/m2       Type 2 diabetes mellitus with vascular disease ([place]) Kelli Spencer is reminded of the importance of commitment to daily physical activity for 30 minutes or more, as able and the need to limit carbohydrate intake to 30 to 60 grams per meal to help with blood sugar control.   The need to take medication as prescribed, test blood sugar as directed, and to call between visits if there is a concern that blood sugar is uncontrolled is also discussed.   Kelli Spencer is reminded of the importance of daily foot exam, annual eye examination, and good blood sugar, blood pressure and cholesterol control. Deteriorated and not at goal, stop rtradjenta resume metformin     Latest Ref Rng & Units 01/05/2023    9:19 AM 08/01/2022    9:01 AM 06/11/2022    9:20 AM 01/21/2022    8:41 AM 09/20/2021   10:11 AM  Diabetic Labs  HbA1c 4.8 - 5.6 %  7.2  6.8   7.0  7.3   Micro/Creat Ratio 0 - 29 mg/g creat    4    Chol 0 - 200 mg/dL 118    117    HDL >40 mg/dL 42    43    Calc LDL 0 - 99 mg/dL 66    61    Triglycerides <150 mg/dL 52    61    Creatinine 0.44 - 1.00 mg/dL 1.17  1.31  1.12  1.23  1.30       01/09/2023    8:41 AM 01/09/2023    8:23 AM 08/08/2022    8:12 AM 06/13/2022   11:14 AM 06/13/2022    9:49 AM 06/11/2022    8:59 AM 04/30/2022    2:13 PM  BP/Weight  Systolic BP 0000000 0000000 [id number]  Diastolic BP 84 91 72 60 85 74 83  Wt. (Lbs)  188.04 182   182.1 182.2  BMI  36.72 kg/m2 35.54 kg/m2   35.56 kg/m2 35.58 kg/m2      Latest Ref Rng & Units 08/08/2022    8:00 AM 04/09/2022   12:00 AM  Foot/eye exam completion dates   Eye Exam No Retinopathy  No Retinopathy      Foot Form Completion  Done      This result is from an external source.        Morbid obesity ([place])  Patient re-educated about  the importance of commitment to a  minimum of 150 minutes of exercise per week as able.  The importance of healthy food choices with portion control discussed, as well as eating regularly and within a 12 hour window most days. The need to choose "clean , green" food 50 to 75% of the time is discussed, as well as to make water the primary drink and set a goal of 64 ounces water daily.       01/09/2023    8:23 AM 08/08/2022    8:12 AM 06/11/2022    8:59 AM  Weight /BMI  Weight 188 lb 0.6 oz 182 lb 182 lb 1.6 oz  Height 5' (1.524 m) 5' (1.524 m) 5' (1.524 m)  BMI 36.72 kg/m2 35.54 kg/m2 35.56 kg/m2      Dyslipidemia, goal LDL below 100 Hyperlipidemia:Low fat diet discussed and encouraged.   Lipid Panel  Lab Results  Component Value Date   CHOL 118 01/05/2023   HDL 42 01/05/2023   LDLCALC 66 01/05/2023   TRIG 52 01/05/2023   CHOLHDL 2.8 01/05/2023     Controlled, no change in medication   GERD (gastroesophageal reflux disease) Uncontrolled with heartburn and symptoms of reflux, refer GI, at times has dysphagia, stop caffeine and GI referral

## 2023-01-15 NOTE — Assessment & Plan Note (Signed)
Controlled, no change in medication DASH diet and commitment to daily physical activity for a minimum of 30 minutes discussed and encouraged, as a part of hypertension management. The importance of attaining a healthy weight is also discussed.     01/09/2023    8:41 AM 01/09/2023    8:23 AM 08/08/2022    8:12 AM 06/13/2022   11:14 AM 06/13/2022    9:49 AM 06/11/2022    8:59 AM 04/30/2022    2:13 PM  BP/Weight  Systolic BP 0000000 0000000 [id number]  Diastolic BP 84 91 72 60 85 74 83  Wt. (Lbs)  188.04 182   182.1 182.2  BMI  36.72 kg/m2 35.54 kg/m2   35.56 kg/m2 35.58 kg/m2

## 2023-01-15 NOTE — Assessment & Plan Note (Addendum)
Uncontrolled with heartburn and symptoms of reflux, refer GI, at times has dysphagia, stop caffeine and GI referral

## 2023-02-25 ENCOUNTER — Other Ambulatory Visit: Payer: Self-pay | Admitting: Family Medicine

## 2023-02-26 ENCOUNTER — Other Ambulatory Visit: Payer: Self-pay | Admitting: Family Medicine

## 2023-03-13 ENCOUNTER — Other Ambulatory Visit: Payer: Self-pay | Admitting: Family Medicine

## 2023-03-19 ENCOUNTER — Other Ambulatory Visit: Payer: Self-pay | Admitting: Gastroenterology

## 2023-03-19 DIAGNOSIS — K219 Gastro-esophageal reflux disease without esophagitis: Secondary | ICD-10-CM

## 2023-04-13 DIAGNOSIS — E119 Type 2 diabetes mellitus without complications: Secondary | ICD-10-CM | POA: Diagnosis not present

## 2023-04-13 LAB — HM DIABETES EYE EXAM

## 2023-05-02 ENCOUNTER — Other Ambulatory Visit: Payer: Self-pay | Admitting: Family Medicine

## 2023-05-06 ENCOUNTER — Other Ambulatory Visit: Payer: Self-pay | Admitting: Family Medicine

## 2023-05-07 ENCOUNTER — Other Ambulatory Visit: Payer: Self-pay | Admitting: Family Medicine

## 2023-05-11 ENCOUNTER — Other Ambulatory Visit (HOSPITAL_COMMUNITY)
Admission: RE | Admit: 2023-05-11 | Discharge: 2023-05-11 | Disposition: A | Discharge status: Home / Self Care | Payer: 59 | Source: Ambulatory Visit | Attending: Family Medicine | Admitting: Family Medicine

## 2023-05-11 DIAGNOSIS — E1159 Type 2 diabetes mellitus with other circulatory complications: Secondary | ICD-10-CM | POA: Diagnosis not present

## 2023-05-11 LAB — BASIC METABOLIC PANEL
Anion gap: 9 (ref 5–15)
BUN: 19 mg/dL (ref 6–20)
CO2: 27 mmol/L (ref 22–32)
Calcium: 9.3 mg/dL (ref 8.9–10.3)
Chloride: 99 mmol/L (ref 98–111)
Creatinine, Ser: 1.14 mg/dL — ABNORMAL HIGH (ref 0.44–1.00)
GFR, Estimated: 56 mL/min — ABNORMAL LOW (ref >60)
Glucose, Bld: 73 mg/dL (ref 70–99)
Potassium: 3.7 mmol/L (ref 3.5–5.1)
Sodium: 135 mmol/L (ref 135–145)

## 2023-05-12 LAB — MICROALBUMIN / CREATININE URINE RATIO
Creatinine, Urine: 113.2 mg/dL
Microalb Creat Ratio: 3 mg/g creat (ref 0–29)
Microalb, Ur: 3.1 ug/mL — ABNORMAL HIGH

## 2023-05-12 LAB — HEMOGLOBIN A1C
Hgb A1c MFr Bld: 7.1 % — ABNORMAL HIGH (ref 4.8–5.6)
Mean Plasma Glucose: 157 mg/dL

## 2023-05-15 ENCOUNTER — Encounter: Payer: Self-pay | Admitting: Family Medicine

## 2023-05-15 ENCOUNTER — Ambulatory Visit (INDEPENDENT_AMBULATORY_CARE_PROVIDER_SITE_OTHER): Payer: 59 | Admitting: Family Medicine

## 2023-05-15 VITALS — BP 108/74 | HR 82 | Ht 60.0 in | Wt 181.0 lb

## 2023-05-15 DIAGNOSIS — Z2821 Immunization not carried out because of patient refusal: Secondary | ICD-10-CM

## 2023-05-15 DIAGNOSIS — E785 Hyperlipidemia, unspecified: Secondary | ICD-10-CM | POA: Diagnosis not present

## 2023-05-15 DIAGNOSIS — K219 Gastro-esophageal reflux disease without esophagitis: Secondary | ICD-10-CM | POA: Diagnosis not present

## 2023-05-15 DIAGNOSIS — Z1231 Encounter for screening mammogram for malignant neoplasm of breast: Secondary | ICD-10-CM | POA: Diagnosis not present

## 2023-05-15 DIAGNOSIS — I10 Essential (primary) hypertension: Secondary | ICD-10-CM

## 2023-05-15 DIAGNOSIS — E559 Vitamin D deficiency, unspecified: Secondary | ICD-10-CM | POA: Diagnosis not present

## 2023-05-15 DIAGNOSIS — E1159 Type 2 diabetes mellitus with other circulatory complications: Secondary | ICD-10-CM

## 2023-05-15 MED ORDER — RYBELSUS 3 MG PO TABS
ORAL_TABLET | ORAL | 2 refills | Status: DC
Start: 2023-05-15 — End: 2023-08-21

## 2023-05-15 NOTE — Assessment & Plan Note (Signed)
Controlled, no change in medication  

## 2023-05-15 NOTE — Assessment & Plan Note (Signed)
Controlled, no change in medication DASH diet and commitment to daily physical activity for a minimum of 30 minutes discussed and encouraged, as a part of hypertension management. The importance of attaining a healthy weight is also discussed.     05/15/2023    8:02 AM 01/09/2023    8:41 AM 01/09/2023    8:23 AM 08/08/2022    8:12 AM 06/13/2022   11:14 AM 06/13/2022    9:49 AM 06/11/2022    8:59 AM  BP/Weight  Systolic BP 108 128 138 120 110 122 121  Diastolic BP 74 84 91 72 60 85 74  Wt. (Lbs) 181  188.04 182   182.1  BMI 35.35 kg/m2  36.72 kg/m2 35.54 kg/m2   35.56 kg/m2

## 2023-05-15 NOTE — Assessment & Plan Note (Signed)
Hyperlipidemia:Low fat diet discussed and encouraged.   Lipid Panel  Lab Results  Component Value Date   CHOL 118 01/05/2023   HDL 42 01/05/2023   LDLCALC 66 01/05/2023   TRIG 52 01/05/2023   CHOLHDL 2.8 01/05/2023     Controlled, no change in medication  

## 2023-05-15 NOTE — Assessment & Plan Note (Signed)
Improved Kelli Spencer is reminded of the importance of commitment to daily physical activity for 30 minutes or more, as able and the need to limit carbohydrate intake to 30 to 60 grams per meal to help with blood sugar control.   The need to take medication as prescribed, test blood sugar as directed, and to call between visits if there is a concern that blood sugar is uncontrolled is also discussed.   Kelli Spencer is reminded of the importance of daily foot exam, annual eye examination, and good blood sugar, blood pressure and cholesterol control.     Latest Ref Rng & Units 05/11/2023    9:16 AM 01/05/2023    9:19 AM 08/01/2022    9:01 AM 06/11/2022    9:20 AM 01/21/2022    8:41 AM  Diabetic Labs  HbA1c 4.8 - 5.6 % 7.1  7.2  6.8   7.0   Microalbumin Not Estab. ug/mL 3.1       Micro/Creat Ratio 0 - 29 mg/g creat 3     4   Chol 0 - 200 mg/dL  161    096   HDL >04 mg/dL  42    43   Calc LDL 0 - 99 mg/dL  66    61   Triglycerides <150 mg/dL  52    61   Creatinine 0.44 - 1.00 mg/dL 5.40  9.81  1.91  4.78  1.23       05/15/2023    8:02 AM 01/09/2023    8:41 AM 01/09/2023    8:23 AM 08/08/2022    8:12 AM 06/13/2022   11:14 AM 06/13/2022    9:49 AM 06/11/2022    8:59 AM  BP/Weight  Systolic BP 108 128 138 120 110 122 121  Diastolic BP 74 84 91 72 60 85 74  Wt. (Lbs) 181  188.04 182   182.1  BMI 35.35 kg/m2  36.72 kg/m2 35.54 kg/m2   35.56 kg/m2      Latest Ref Rng & Units 04/13/2023   12:00 AM 08/08/2022    8:00 AM  Foot/eye exam completion dates  Eye Exam No Retinopathy No Retinopathy       Foot Form Completion   Done     This result is from an external source.

## 2023-05-15 NOTE — Progress Notes (Signed)
Kelli Spencer     MRN: [id number]      DOB: Feb 21, 1966  Chief Complaint  Patient presents with   Follow-up    Follow up    HPI Kelli Spencer is here for follow up and re-evaluation of chronic medical conditions, medication management and review of any available recent lab and radiology data.  Preventive health is updated, specifically  Cancer screening and Immunization.   Questions or concerns regarding consultations or procedures which the PT has had in the interim are  addressed. The PT denies any adverse reactions to current medications since the last visit.  There are no new concerns.  There are no specific complaints  Denies polyuria, polydipsia, blurred vision , or hypoglycemic episodes. Tests and records every morning, log shows an avg of 110  ROS Denies recent fever or chills. Denies sinus pressure, nasal congestion, ear pain or sore throat. Denies chest congestion, productive cough or wheezing. Denies chest pains, palpitations and leg swelling Denies abdominal pain, nausea, vomiting,diarrhea or constipation.   Denies dysuria, frequency, hesitancy or incontinence. Chronic joint pain, swelling and limitation in mobility. Denies headaches, seizures, numbness, or tingling. Denies depression, anxiety or insomnia. Denies skin break down or rash.   PE  BP 108/74 (BP Location: Right Arm, Patient Position: Sitting, Cuff Size: Large)   Pulse 82   Ht 5' (1.524 m)   Wt 181 lb (82.1 kg)   LMP 06/10/1988 (Approximate)   SpO2 92%   BMI 35.35 kg/m   Patient alert and oriented and in no cardiopulmonary distress.  HEENT: No facial asymmetry, EOMI,     Neck supple .  Chest: Clear to auscultation bilaterally.  CVS: S1, S2 no murmurs, no S3.Regular rate.  ABD: Soft non tender.   Ext: No edema  MS: Adequate ROM spine, shoulders, hips and knees.  Skin: Intact, no ulcerations or rash noted.  Psych: Good eye contact, normal affect. Memory intact not anxious or depressed  appearing.  CNS: CN 2-12 intact, power,  normal throughout.no focal deficits noted.   Assessment & Plan  Essential hypertension Controlled, no change in medication DASH diet and commitment to daily physical activity for a minimum of 30 minutes discussed and encouraged, as a part of hypertension management. The importance of attaining a healthy weight is also discussed.     05/15/2023    8:02 AM 01/09/2023    8:41 AM 01/09/2023    8:23 AM 08/08/2022    8:12 AM 06/13/2022   11:14 AM 06/13/2022    9:49 AM 06/11/2022    8:59 AM  BP/Weight  Systolic BP 108 128 138 120 110 122 121  Diastolic BP 74 84 91 72 60 85 74  Wt. (Lbs) 181  188.04 182   182.1  BMI 35.35 kg/m2  36.72 kg/m2 35.54 kg/m2   35.56 kg/m2       Morbid obesity ([facility])  Patient re-educated about  the importance of commitment to a  minimum of 150 minutes of exercise per week as able.  The importance of healthy food choices with portion control discussed, as well as eating regularly and within a 12 hour window most days. The need to choose "clean , green" food 50 to 75% of the time is discussed, as well as to make water the primary drink and set a goal of 64 ounces water daily.       05/15/2023    8:02 AM 01/09/2023    8:23 AM 08/08/2022    8:12 AM  Weight /  BMI  Weight 181 lb 188 lb 0.6 oz 182 lb  Height 5' (1.524 m) 5' (1.524 m) 5' (1.524 m)  BMI 35.35 kg/m2 36.72 kg/m2 35.54 kg/m2    Improved  Type 2 diabetes mellitus with vascular disease ([facility]) Improved Kelli Spencer is reminded of the importance of commitment to daily physical activity for 30 minutes or more, as able and the need to limit carbohydrate intake to 30 to 60 grams per meal to help with blood sugar control.   The need to take medication as prescribed, test blood sugar as directed, and to call between visits if there is a concern that blood sugar is uncontrolled is also discussed.   Kelli Spencer is reminded of the importance of daily foot exam, annual eye  examination, and good blood sugar, blood pressure and cholesterol control.     Latest Ref Rng & Units 05/11/2023    9:16 AM 01/05/2023    9:19 AM 08/01/2022    9:01 AM 06/11/2022    9:20 AM 01/21/2022    8:41 AM  Diabetic Labs  HbA1c 4.8 - 5.6 % 7.1  7.2  6.8   7.0   Microalbumin Not Estab. ug/mL 3.1       Micro/Creat Ratio 0 - 29 mg/g creat 3     4   Chol 0 - 200 mg/dL  865    784   HDL >69 mg/dL  42    43   Calc LDL 0 - 99 mg/dL  66    61   Triglycerides <150 mg/dL  52    61   Creatinine 0.44 - 1.00 mg/dL 6.29  5.28  4.13  2.44  1.23       05/15/2023    8:02 AM 01/09/2023    8:41 AM 01/09/2023    8:23 AM 08/08/2022    8:12 AM 06/13/2022   11:14 AM 06/13/2022    9:49 AM 06/11/2022    8:59 AM  BP/Weight  Systolic BP 108 128 138 120 110 122 121  Diastolic BP 74 84 91 72 60 85 74  Wt. (Lbs) 181  188.04 182   182.1  BMI 35.35 kg/m2  36.72 kg/m2 35.54 kg/m2   35.56 kg/m2      Latest Ref Rng & Units 04/13/2023   12:00 AM 08/08/2022    8:00 AM  Foot/eye exam completion dates  Eye Exam No Retinopathy No Retinopathy       Foot Form Completion   Done     This result is from an external source.        Immunization refused Asked and continues to refuse all vaccines  Dyslipidemia, goal LDL below 100 Hyperlipidemia:Low fat diet discussed and encouraged.   Lipid Panel  Lab Results  Component Value Date   CHOL 118 01/05/2023   HDL 42 01/05/2023   LDLCALC 66 01/05/2023   TRIG 52 01/05/2023   CHOLHDL 2.8 01/05/2023     Controlled, no change in medication   GERD (gastroesophageal reflux disease) Controlled, no change in medication

## 2023-05-15 NOTE — Assessment & Plan Note (Signed)
  Patient re-educated about  the importance of commitment to a  minimum of 150 minutes of exercise per week as able.  The importance of healthy food choices with portion control discussed, as well as eating regularly and within a 12 hour window most days. The need to choose "clean , green" food 50 to 75% of the time is discussed, as well as to make water the primary drink and set a goal of 64 ounces water daily.       05/15/2023    8:02 AM 01/09/2023    8:23 AM 08/08/2022    8:12 AM  Weight /BMI  Weight 181 lb 188 lb 0.6 oz 182 lb  Height 5' (1.524 m) 5' (1.524 m) 5' (1.524 m)  BMI 35.35 kg/m2 36.72 kg/m2 35.54 kg/m2    Improved

## 2023-05-15 NOTE — Assessment & Plan Note (Signed)
Asked and continues to refuse all vaccines

## 2023-05-15 NOTE — Patient Instructions (Addendum)
Annual exam mid-October, call call if you need me sooner.  Please bring all medications in this that visit.  Congratulations on excellent labs and good health habits keep them up.  Please schedule mammogram at checkout.  Fasting labs 3 to 5 days before next visit  CBC lipid CMP and EGFR HbA1c TSH and vitamin D.  It is important that you exercise regularly at least 30 minutes 5 times a week. If you develop chest pain, have severe difficulty breathing, or feel very tired, stop exercising immediately and seek medical attention    Thanks for choosing [facility], we consider it a privelige to serve you.

## 2023-05-28 ENCOUNTER — Other Ambulatory Visit: Payer: Self-pay | Admitting: Gastroenterology

## 2023-05-28 DIAGNOSIS — K219 Gastro-esophageal reflux disease without esophagitis: Secondary | ICD-10-CM

## 2023-05-29 NOTE — Telephone Encounter (Signed)
She needs a nonurgent follow up for refills in the next couple of months.

## 2023-06-29 ENCOUNTER — Encounter: Payer: Self-pay | Admitting: Gastroenterology

## 2023-07-13 ENCOUNTER — Other Ambulatory Visit: Payer: Self-pay | Admitting: Family Medicine

## 2023-08-08 ENCOUNTER — Other Ambulatory Visit: Payer: Self-pay | Admitting: Family Medicine

## 2023-08-08 DIAGNOSIS — E785 Hyperlipidemia, unspecified: Secondary | ICD-10-CM

## 2023-08-12 ENCOUNTER — Ambulatory Visit (HOSPITAL_COMMUNITY)
Admission: RE | Admit: 2023-08-12 | Discharge: 2023-08-12 | Disposition: A | Discharge status: Home / Self Care | Payer: 59 | Source: Ambulatory Visit | Attending: Family Medicine | Admitting: Family Medicine

## 2023-08-12 DIAGNOSIS — Z1231 Encounter for screening mammogram for malignant neoplasm of breast: Secondary | ICD-10-CM | POA: Insufficient documentation

## 2023-08-14 ENCOUNTER — Other Ambulatory Visit (HOSPITAL_COMMUNITY)
Admission: RE | Admit: 2023-08-14 | Discharge: 2023-08-14 | Disposition: A | Discharge status: Home / Self Care | Payer: 59 | Source: Ambulatory Visit | Attending: Family Medicine | Admitting: Family Medicine

## 2023-08-14 ENCOUNTER — Other Ambulatory Visit (HOSPITAL_COMMUNITY): Payer: Self-pay | Admitting: Family Medicine

## 2023-08-14 DIAGNOSIS — E1159 Type 2 diabetes mellitus with other circulatory complications: Secondary | ICD-10-CM | POA: Diagnosis not present

## 2023-08-14 DIAGNOSIS — R928 Other abnormal and inconclusive findings on diagnostic imaging of breast: Secondary | ICD-10-CM

## 2023-08-14 DIAGNOSIS — E559 Vitamin D deficiency, unspecified: Secondary | ICD-10-CM | POA: Diagnosis not present

## 2023-08-14 LAB — CBC
HCT: 38.1 % (ref 36.0–46.0)
Hemoglobin: 12.8 g/dL (ref 12.0–15.0)
MCH: 30.2 pg (ref 26.0–34.0)
MCHC: 33.6 g/dL (ref 30.0–36.0)
MCV: 89.9 fL (ref 80.0–100.0)
Platelets: 325 10*3/uL (ref 150–400)
RBC: 4.24 MIL/uL (ref 3.87–5.11)
RDW: 12.5 % (ref 11.5–15.5)
WBC: 5.8 10*3/uL (ref 4.0–10.5)
nRBC: 0 % (ref 0.0–0.2)

## 2023-08-14 LAB — COMPREHENSIVE METABOLIC PANEL
ALT: 17 U/L (ref 0–44)
AST: 18 U/L (ref 15–41)
Albumin: 4.1 g/dL (ref 3.5–5.0)
Alkaline Phosphatase: 96 U/L (ref 38–126)
Anion gap: 8 (ref 5–15)
BUN: 14 mg/dL (ref 6–20)
CO2: 28 mmol/L (ref 22–32)
Calcium: 9.3 mg/dL (ref 8.9–10.3)
Chloride: 96 mmol/L — ABNORMAL LOW (ref 98–111)
Creatinine, Ser: 1.06 mg/dL — ABNORMAL HIGH (ref 0.44–1.00)
GFR, Estimated: >60 mL/min (ref >60)
Glucose, Bld: 87 mg/dL (ref 70–99)
Potassium: 3.7 mmol/L (ref 3.5–5.1)
Sodium: 132 mmol/L — ABNORMAL LOW (ref 135–145)
Total Bilirubin: 0.6 mg/dL (ref 0.3–1.2)
Total Protein: 8.1 g/dL (ref 6.5–8.1)

## 2023-08-14 LAB — HEMOGLOBIN A1C
Hgb A1c MFr Bld: 6.8 % — ABNORMAL HIGH (ref 4.8–5.6)
Mean Plasma Glucose: 148.46 mg/dL

## 2023-08-14 LAB — VITAMIN D 25 HYDROXY (VIT D DEFICIENCY, FRACTURES): Vit D, 25-Hydroxy: 48.66 ng/mL (ref 30–100)

## 2023-08-14 LAB — LIPID PANEL
Cholesterol: 107 mg/dL (ref 0–200)
HDL: 42 mg/dL (ref >40)
LDL Cholesterol: 53 mg/dL (ref 0–99)
Total CHOL/HDL Ratio: 2.5 {ratio}
Triglycerides: 58 mg/dL (ref <150)
VLDL: 12 mg/dL (ref 0–40)

## 2023-08-14 LAB — TSH: TSH: 3.09 u[IU]/mL (ref 0.350–4.500)

## 2023-08-21 ENCOUNTER — Encounter: Payer: Self-pay | Admitting: Family Medicine

## 2023-08-21 ENCOUNTER — Ambulatory Visit (INDEPENDENT_AMBULATORY_CARE_PROVIDER_SITE_OTHER): Payer: 59 | Admitting: Family Medicine

## 2023-08-21 VITALS — BP 123/86 | HR 72 | Ht 60.0 in | Wt 180.0 lb

## 2023-08-21 DIAGNOSIS — Z2821 Immunization not carried out because of patient refusal: Secondary | ICD-10-CM

## 2023-08-21 DIAGNOSIS — I10 Essential (primary) hypertension: Secondary | ICD-10-CM

## 2023-08-21 DIAGNOSIS — Z0001 Encounter for general adult medical examination with abnormal findings: Secondary | ICD-10-CM | POA: Diagnosis not present

## 2023-08-21 DIAGNOSIS — E1159 Type 2 diabetes mellitus with other circulatory complications: Secondary | ICD-10-CM

## 2023-08-21 MED ORDER — METFORMIN HCL 500 MG PO TABS: 500.0000 mg | ORAL_TABLET | Freq: Every day | ORAL | 3 refills | Status: DC

## 2023-08-21 MED ORDER — RYBELSUS 3 MG PO TABS: ORAL_TABLET | ORAL | 3 refills | Status: DC

## 2023-08-21 NOTE — Progress Notes (Unsigned)
Kelli Spencer     MRN: [id number]      DOB: 09-Jan-1966  Chief Complaint  Patient presents with   Annual Exam    CPE    HPI: Patient is in for annual physical exam. No other health concerns are expressed or addressed at the visit. Recent labs,  are reviewed. Immunization is reviewed , and  updated if needed.   PE: Pleasant  female, alert and oriented x 3, in no cardio-pulmonary distress. Afebrile. HEENT No facial trauma or asymetry. Sinuses non tender.  Extra occullar muscles intact.. External ears normal, . Neck: supple, no adenopathy,JVD or thyromegaly.No bruits.  Chest: Clear to ascultation bilaterally.No crackles or wheezes. Non tender to palpation  Breast: No asymetry,no masses or lumps. No tenderness. No nipple discharge or inversion. No axillary or supraclavicular adenopathy  Cardiovascular system; Heart sounds normal,  S1 and  S2 ,no S3.  No murmur, or thrill. Apical beat not displaced Peripheral pulses normal.  Abdomen: Soft, non tender, no organomegaly or masses. No bruits. Bowel sounds normal. No guarding, tenderness or rebound.   GU: External genitalia normal female genitalia , normal female distribution of hair. No lesions. Urethral meatus normal in size, no  Prolapse, no lesions visibly  Present. Bladder non tender. Vagina pink and moist , with no visible lesions , discharge present . Adequate pelvic support no  cystocele or rectocele noted Cervix pink and appears healthy, no lesions or ulcerations noted, no discharge noted from os Uterus normal size, no adnexal masses, no cervical motion or adnexal tenderness.   Musculoskeletal exam: Full ROM of spine, hips , shoulders and knees. No deformity ,swelling or crepitus noted. No muscle wasting or atrophy.   Neurologic: Cranial nerves 2 to 12 intact. Power, tone ,sensation and reflexes normal throughout. No disturbance in gait. No tremor.  Skin: Intact, no ulceration, erythema , scaling  or rash noted. Pigmentation normal throughout  Psych; Normal mood and affect. Judgement and concentration normal   Assessment & Plan:  No problem-specific Assessment & Plan notes found for this encounter.

## 2023-08-21 NOTE — Patient Instructions (Signed)
F/U in 18 weeks, call if you need me sooner  EXCELLENT labs and exam, keep up the great work!  We will look at changing diabetes management when you next come , as we discussed.  Try to commit to exercise 30 mins , five days per week and eat smaller portions  Non fasting HBA1C, chem 7 and EGFR 3 to 7 days before next visit  HAPPY BIRTHDAY, and many more is my wish!  Please continue to consider vaccination!Thanks for choosing [facility], we consider it a privelige to serve you.

## 2023-08-21 NOTE — Assessment & Plan Note (Signed)

## 2023-08-23 NOTE — Assessment & Plan Note (Signed)
Offered and refuses all vaccines, including influenza

## 2023-08-23 NOTE — Assessment & Plan Note (Signed)
Controlled, no change in medication Ms. Nogueras is reminded of the importance of commitment to daily physical activity for 30 minutes or more, as able and the need to limit carbohydrate intake to 30 to 60 grams per meal to help with blood sugar control.   The need to take medication as prescribed, test blood sugar as directed, and to call between visits if there is a concern that blood sugar is uncontrolled is also discussed.   Ms. Tenison is reminded of the importance of daily foot exam, annual eye examination, and good blood sugar, blood pressure and cholesterol control.     Latest Ref Rng & Units 08/14/2023    9:56 AM 05/11/2023    9:16 AM 01/05/2023    9:19 AM 08/01/2022    9:01 AM 06/11/2022    9:20 AM  Diabetic Labs  HbA1c 4.8 - 5.6 % 6.8  7.1  7.2  6.8    Microalbumin Not Estab. ug/mL  3.1      Micro/Creat Ratio 0 - 29 mg/g creat  3      Chol 0 - 200 mg/dL 161   096     HDL >04 mg/dL 42   42     Calc LDL 0 - 99 mg/dL 53   66     Triglycerides <150 mg/dL 58   52     Creatinine 0.44 - 1.00 mg/dL 5.40  9.81  1.91  4.78  1.12       08/21/2023    8:25 AM 05/15/2023    8:02 AM 01/09/2023    8:41 AM 01/09/2023    8:23 AM 08/08/2022    8:12 AM 06/13/2022   11:14 AM 06/13/2022    9:49 AM  BP/Weight  Systolic BP 123 108 128 138 120 110 122  Diastolic BP 86 74 84 91 72 60 85  Wt. (Lbs) 180 181  188.04 182    BMI 35.15 kg/m2 35.35 kg/m2  36.72 kg/m2 35.54 kg/m2        Latest Ref Rng & Units 08/21/2023    8:20 AM 04/13/2023   12:00 AM  Foot/eye exam completion dates  Eye Exam No Retinopathy  No Retinopathy      Foot Form Completion  Done      This result is from an external source.

## 2023-08-25 ENCOUNTER — Ambulatory Visit (HOSPITAL_COMMUNITY)
Admission: RE | Admit: 2023-08-25 | Discharge: 2023-08-25 | Disposition: A | Discharge status: Home / Self Care | Payer: 59 | Source: Ambulatory Visit | Attending: Family Medicine | Admitting: Family Medicine

## 2023-08-25 ENCOUNTER — Encounter (HOSPITAL_COMMUNITY): Payer: Self-pay

## 2023-08-25 DIAGNOSIS — R928 Other abnormal and inconclusive findings on diagnostic imaging of breast: Secondary | ICD-10-CM

## 2023-08-25 DIAGNOSIS — R92313 Mammographic fatty tissue density, bilateral breasts: Secondary | ICD-10-CM | POA: Diagnosis not present

## 2023-08-25 DIAGNOSIS — N6489 Other specified disorders of breast: Secondary | ICD-10-CM | POA: Diagnosis not present

## 2023-09-01 ENCOUNTER — Other Ambulatory Visit: Payer: Self-pay | Admitting: Family Medicine

## 2023-09-02 ENCOUNTER — Other Ambulatory Visit: Payer: Self-pay

## 2023-09-02 ENCOUNTER — Telehealth: Payer: Self-pay | Admitting: Family Medicine

## 2023-09-02 MED ORDER — METFORMIN HCL 500 MG PO TABS: 500.0000 mg | ORAL_TABLET | Freq: Every day | ORAL | 3 refills | Status: DC

## 2023-09-02 MED ORDER — GLIPIZIDE 10 MG PO TABS: ORAL_TABLET | ORAL | 2 refills | Status: DC

## 2023-09-02 NOTE — Telephone Encounter (Signed)
Patient called said the order for blood pressure sugar blood pill at [facility], they have stop selling it. Can it be sent to [facility] [address] [place]

## 2023-09-02 NOTE — Telephone Encounter (Signed)
Meds sent

## 2023-10-22 ENCOUNTER — Ambulatory Visit: Payer: Self-pay | Admitting: Family Medicine

## 2023-10-22 NOTE — Telephone Encounter (Signed)
  Chief Complaint: Swelling around eyes Symptoms: Swelling around R eye, itching  Frequency: Began yesterday Pertinent Negatives: Patient denies visual changes, injury, exposure to irritants, rash, fever, runny nose Disposition: [] ED /[] Urgent Care (no appt availability in office) / [] Appointment(In office/virtual)/ []  [place] [facility]/ [x] Home Care/ [] Refused Recommended Disposition /[] [facility]/ []  Follow-up with PCP Additional Notes: Pt called stating she woke up yesterday with some swelling around R eye and puffiness under L eye. States Her R eye is very itchy and occasionally when she is scratching it, it feels painful. Denies pain at this time. States there is occasional tear in the R eye. Denies visual changes, redness, streaking, rash, fever, congestion, or injury. States she has not tried any antihistamine for relief. Per protocol, home care appropriate. Reviewed care instructions with pt, she verbalized understanding and denied further questions. Alerting PCP for review.   Copied from CRM [id number]. Topic: Clinical - Pink Word Triage >> Oct 22, 2023  1:49 PM Geneva B wrote: Reason for Triage: pt woke up with swelling on right side under her eye Reason for Disposition  Eyelid swelling from suspected mild irritant  Answer Assessment - Initial Assessment Questions 1. ONSET: "When did the swelling start?" (e.g., minutes, hours, days)     Yesterday when waking up 2. LOCATION: "What part of the eyelids is swollen?"     All around the R eye- some on L eye 3. SEVERITY: "How swollen is it?"     Noticeable, but not swollen shut 4. ITCHING: "Is there any itching?" If Yes, ask: "How much?"   (Scale 1-10; mild, moderate or severe)     8/10 5. PAIN: "Is the swelling painful to touch?" If Yes, ask: "How painful is it?"   (Scale 1-10; mild, moderate or severe)     A little pain when scratching 6. FEVER: "Do you have a fever?" If Yes, ask: "What is it, how was it measured, and  when did it start?"      Denies 7. CAUSE: "What do you think is causing the swelling?"     I don't know 8. RECURRENT SYMPTOM: "Have you had eyelid swelling before?" If Yes, ask: "When was the last time?" "What happened that time?"     Denies 9. OTHER SYMPTOMS: "Do you have any other symptoms?" (e.g., blurred vision, eye discharge, rash, runny nose)     Watery in L eye  Protocols used: Eye - Swelling-A-AH

## 2023-10-26 ENCOUNTER — Encounter: Payer: Self-pay | Admitting: Internal Medicine

## 2023-10-26 ENCOUNTER — Ambulatory Visit: Payer: Self-pay | Admitting: Family Medicine

## 2023-10-26 ENCOUNTER — Ambulatory Visit (INDEPENDENT_AMBULATORY_CARE_PROVIDER_SITE_OTHER): Payer: 59 | Admitting: Internal Medicine

## 2023-10-26 ENCOUNTER — Ambulatory Visit (INDEPENDENT_AMBULATORY_CARE_PROVIDER_SITE_OTHER): Payer: 59

## 2023-10-26 VITALS — BP 138/82 | HR 74 | Ht 60.0 in | Wt 178.6 lb

## 2023-10-26 VITALS — Ht 60.0 in | Wt 180.0 lb

## 2023-10-26 DIAGNOSIS — R220 Localized swelling, mass and lump, head: Secondary | ICD-10-CM

## 2023-10-26 DIAGNOSIS — Z0000 Encounter for general adult medical examination without abnormal findings: Secondary | ICD-10-CM | POA: Diagnosis not present

## 2023-10-26 MED ORDER — HYDROXYZINE HCL 10 MG PO TABS
10.0000 mg | ORAL_TABLET | Freq: Three times a day (TID) | ORAL | 0 refills | Status: DC | PRN
Start: 2023-10-26 — End: 2023-12-08

## 2023-10-26 NOTE — Progress Notes (Signed)
Subjective:   BRIANNAH BRENDEN is a 57 y.o. female who presents for Medicare Annual (Subsequent) preventive examination.  Visit Complete: Virtual I connected with  Madie Reno on 10/26/23 by a audio enabled telemedicine application and verified that I am speaking with the correct person using two identifiers.  Patient Location: Home  Provider Location: Home Office  I discussed the limitations of evaluation and management by telemedicine. The patient expressed understanding and agreed to proceed.  Vital Signs: Because this visit was a virtual/telehealth visit, some criteria may be missing or patient reported. Any vitals not documented were not able to be obtained and vitals that have been documented are patient reported.  Patient Medicare AWV questionnaire was completed by the patient on (not done); I have confirmed that all information answered by patient is correct and no changes since this date. Cardiac Risk Factors include: advanced age (>100men, >83 women);diabetes mellitus;dyslipidemia;hypertension;obesity (BMI >30kg/m2);sedentary lifestyle    Objective:    Today's Vitals   10/26/23 0837  Weight: 180 lb (81.6 kg)  Height: 5' (1.524 m)   Body mass index is 35.15 kg/m.     10/26/2023    8:50 AM 09/29/2022    8:41 AM 06/11/2022    8:52 AM 09/23/2021    8:02 AM 01/25/2021    9:45 AM 09/20/2020    3:07 PM 12/27/2018    9:21 AM  Advanced Directives  Does Patient Have a Medical Advance Directive? No No No No No No No  Would patient like information on creating a medical advance directive?  Yes (ED - Information included in AVS) No - Patient declined Yes (ED - Information included in AVS) Yes (MAU/Ambulatory/Procedural Areas - Information given) No - Patient declined Yes (ED - Information included in AVS)    Current Medications (verified) Outpatient Encounter Medications as of 10/26/2023  Medication Sig   ACCU-CHEK GUIDE test strip USE TO CHECK BLOOD SUGAR TWICE DAILY    Accu-Chek Softclix Lancets lancets USE TO TEST blood sugar twice daily  Dx E11.65   aspirin-sod bicarb-citric acid (ALKA-SELTZER) 325 MG TBEF tablet Take 650 mg by mouth at bedtime as needed (indigestion).   blood glucose meter kit and supplies Dispense based on patient and insurance preference. Once daily testing dx e11.9   cetirizine (ZYRTEC) 10 MG tablet Take 10 mg by mouth at bedtime as needed for allergies.   cholecalciferol (VITAMIN D) 400 UNITS TABS tablet Take 800 Units by mouth daily.   glipiZIDE (GLUCOTROL) 10 MG tablet TAKE 1 TABLET BY MOUTH  TWICE DAILY BEFORE MEALS   hydrochlorothiazide (MICROZIDE) 12.5 MG capsule TAKE 1 CAPSULE BY MOUTH DAILY   lubiprostone (AMITIZA) 24 MCG capsule TAKE 1 CAPSULE BY MOUTH TWICE  DAILY WITH MEALS   metFORMIN (GLUCOPHAGE) 500 MG tablet Take 1 tablet (500 mg total) by mouth daily with breakfast.   pravastatin (PRAVACHOL) 10 MG tablet TAKE 1 TABLET BY MOUTH DAILY   RABEprazole (ACIPHEX) 20 MG tablet TAKE 1 TABLET BY MOUTH TWICE  DAILY BEFORE MEALS   Semaglutide (RYBELSUS) 3 MG TABS TAKE 1 TABLET BY MOUTH EVERY DAY FOR DIABETES   No facility-administered encounter medications on file as of 10/26/2023.    Allergies (verified) Prednisone, Amlodipine, and Ace inhibitors   History: Past Medical History:  Diagnosis Date   Arthritis    Asthma    Diabetes mellitus without complication (HCC)    Diastolic hypertension    GERD (gastroesophageal reflux disease)    Hyperlipidemia    Hypertension  Migraines    Obesity    Seizures (HCC)    pt states she has seizures only when she gets upset. pt is taking no seizure meds   Past Surgical History:  Procedure Laterality Date   ABDOMINAL HYSTERECTOMY  1997   fibroids   BALLOON DILATION N/A 01/25/2021   Procedure: BALLOON DILATION;  Surgeon: Lanelle Bal, DO;  Location: [facility];  Service: Endoscopy;  Laterality: N/A;   BIOPSY  01/25/2021   Procedure: BIOPSY;  Surgeon: Lanelle Bal, DO;   [facility]: [facility];  Service: Endoscopy;;   COLONOSCOPY N/A 06/23/2016   Dr. Darrick Penna: prep not adequate to detect polyps less than 6mm, redundant left colon. hemorrhoids. next TCS in 5 years.    COLONOSCOPY WITH PROPOFOL N/A 06/13/2022   Procedure: COLONOSCOPY WITH PROPOFOL;  Surgeon: Lanelle Bal, DO;  [facility]: [facility];  Service: Endoscopy;  Laterality: N/A;  11:00am, asa 3   ESOPHAGOGASTRODUODENOSCOPY N/A 04/02/2018   Dr. Darrick Penna: Benign-appearing esophageal stricture status post dilation, multiple benign gastric polyps, mild gastritis no H. pylori.   ESOPHAGOGASTRODUODENOSCOPY (EGD) WITH PROPOFOL N/A 01/25/2021   Benign-appearing esophageal stenosis status post dilation, gastritis with no H. pylori.  Procedure: ESOPHAGOGASTRODUODENOSCOPY (EGD) WITH PROPOFOL;  Surgeon: Lanelle Bal, DO;  [facility]: [facility];  Service: Endoscopy;  Laterality: N/A;  am appt   KNEE ARTHROSCOPY WITH MEDIAL MENISECTOMY Left 02/09/2014   Procedure: KNEE ARTHROSCOPY WITH MEDIAL MENISECTOMY;  Surgeon: Vickki Hearing, MD;  Location: [facility];  Service: Orthopedics;  Laterality: Left;   PARTIAL HYSTERECTOMY     POLYPECTOMY  06/13/2022   Procedure: POLYPECTOMY;  Surgeon: Lanelle Bal, DO;  [facility]: [facility];  Service: Endoscopy;;   SAVORY DILATION N/A 04/02/2018   Procedure: SAVORY DILATION;  Surgeon: West Bali, MD;  Location: [facility];  Service: Endoscopy;  Laterality: N/A;   TOTAL KNEE ARTHROPLASTY Right 03/18/2017   Procedure: TOTAL KNEE ARTHROPLASTY;  Surgeon: Vickki Hearing, MD;  Location: [facility];  Service: Orthopedics;  Laterality: Right;   Family History  Problem Relation Age of Onset   Kidney failure Mother    Diabetes Mother    Hypertension Mother    Stroke Mother    Diabetes Father    Heart disease Father    Hypertension Sister    Colon cancer Neg Hx    Social History   Socioeconomic History   Marital status: Divorced    Spouse name: Not on file    Number of children: 3   Years of education: Not on file   Highest education level: 9th grade  Occupational History   Occupation: disabled  Tobacco Use   Smoking status: Never   Smokeless tobacco: Never  Vaping Use   Vaping status: Never Used  Substance and Sexual Activity   Alcohol use: No   Drug use: No   Sexual activity: Not Currently  Other Topics Concern   Not on file  Social History Narrative   Daughter currently living with her until she can get her own place.    Social Drivers of Corporate investment banker Strain: Low Risk  (10/26/2023)   Overall Financial Resource Strain (CARDIA)    Difficulty of Paying Living Expenses: Not hard at all  Food Insecurity: No Food Insecurity (10/26/2023)   Hunger Vital Sign    Worried About Running Out of Food in the Last Year: Never true    Ran Out of Food in the Last Year: Never true  Transportation Needs: No Transportation Needs (10/26/2023)   PRAPARE - Administrator, Civil Service (Medical): No    Lack of Transportation (Non-Medical): No  Physical Activity: Inactive (10/26/2023)   Exercise Vital Sign    Days of Exercise per Week: 0 days    Minutes of Exercise per Session: 0 min  Stress: No Stress Concern Present (10/26/2023)   [employer] of Occupational Health - Occupational Stress Questionnaire    Feeling of Stress : Not at all  Social Connections: Moderately Isolated (10/26/2023)   Social Connection and Isolation Panel [NHANES]    Frequency of Communication with Friends and Family: More than three times a week    Frequency of Social Gatherings with Friends and Family: Once a week    Attends Religious Services: More than 4 times per year    Active Member of [employer] or Organizations: No    Attends Engineer, structural: Never    Marital Status: Divorced    Tobacco Counseling Counseling given: Not Answered   Clinical Intake:  Pre-visit preparation completed: No  Pain : No/denies pain      BMI - recorded: 35.15 Nutritional Status: BMI > 30  Obese Nutritional Risks: None Diabetes: Yes CBG done?: No Did pt. bring in CBG monitor from home?: No  How often do you need to have someone help you when you read instructions, pamphlets, or other written materials from your doctor or pharmacy?: 1 - Never  Interpreter Needed?: No  Comments: daughter lives with pt Information entered by :: B.Bishoy Cupp,LPN   Activities of Daily Living    10/26/2023    8:51 AM  In your present state of health, do you have any difficulty performing the following activities:  Hearing? 0  Vision? 0  Difficulty concentrating or making decisions? 0  Walking or climbing stairs? 0  Dressing or bathing? 0  Doing errands, shopping? 0  Preparing Food and eating ? N  Using the Toilet? N  In the past six months, have you accidently leaked urine? N  Do you have problems with loss of bowel control? N  Managing your Medications? N  Managing your Finances? N  Housekeeping or managing your Housekeeping? N    Patient Care Team: Kerri Perches, MD as PCP - General (Family Medicine) Vickki Hearing, MD as Consulting Physician (Orthopedic Surgery) Mare Loan, RD as Dietitian (Nutrition) Lanelle Bal, DO as Consulting Physician (Internal Medicine)  Indicate any recent Medical Services you may have received from other than [facility] providers in the past year (date may be approximate).     Assessment:   This is a routine wellness examination for Karolynn.  Hearing/Vision screen Hearing Screening - Comments:: Pt says she hears pretty good Vision Screening - Comments:: Pt says she has readers only;vision is good. Appts june   Goals Addressed             This Visit's Progress    COMPLETED: DIET - EAT MORE FRUITS AND VEGETABLES   On track    Follow the low purine diet      COMPLETED: HEMOGLOBIN A1C < 7       Increase physical activity   Not on track    Try to walk 30 minutes after  dinner 2 evenings a week      Patient Stated       Patient states that she doesn't currently have any goals just to get better and better.10/26/23       Depression Screen  10/26/2023    8:48 AM 08/21/2023    8:27 AM 05/15/2023    8:03 AM 01/09/2023    8:23 AM 09/29/2022    8:41 AM 08/08/2022    8:14 AM 01/24/2022    8:21 AM  PHQ 2/9 Scores  PHQ - 2 Score 0 0 0 0 0 0 0  PHQ- 9 Score    0       Fall Risk    10/26/2023    8:45 AM 08/21/2023    8:27 AM 05/15/2023    8:03 AM 01/09/2023    8:24 AM 09/29/2022    8:41 AM  Fall Risk   Falls in the past year? 0 0 0 0 0  Number falls in past yr: 0 0 0  0  Injury with Fall? 0 0 0  0  Risk for fall due to : No Fall Risks No Fall Risks No Fall Risks  No Fall Risks  Follow up Education provided;Falls prevention discussed Falls evaluation completed Falls evaluation completed      MEDICARE RISK AT HOME: Medicare Risk at Home Any stairs in or around the home?: Yes If so, are there any without handrails?: Yes Home free of loose throw rugs in walkways, pet beds, electrical cords, etc?: Yes Adequate lighting in your home to reduce risk of falls?: Yes Life alert?: No Use of a cane, walker or w/c?: No Grab bars in the bathroom?: No Shower chair or bench in shower?: No Elevated toilet seat or a handicapped toilet?: No  TIMED UP AND GO:  Was the test performed?  No    Cognitive Function:        10/26/2023    8:53 AM 09/29/2022    8:43 AM 09/23/2021    8:03 AM 09/20/2020    3:13 PM 12/27/2018    9:23 AM  6CIT Screen  What Year? 0 points 0 points 0 points 0 points 0 points  What month? 0 points 0 points 0 points 0 points 0 points  What time? 0 points 0 points 0 points 0 points 0 points  Count back from 20 0 points 2 points 0 points 0 points 0 points  Months in reverse 0 points 0 points 0 points 0 points 0 points  Repeat phrase 0 points 2 points 0 points 0 points 0 points  Total Score 0 points 4 points 0 points 0 points 0 points     Immunizations  There is no immunization history on file for this patient.  TDAP status: Due, Education has been provided regarding the importance of this vaccine. Advised may receive this vaccine at local pharmacy or Health Dept. Aware to provide a copy of the vaccination record if obtained from local pharmacy or Health Dept. Verbalized acceptance and understanding.  Flu Vaccine status: Declined, Education has been provided regarding the importance of this vaccine but patient still declined. Advised may receive this vaccine at local pharmacy or Health Dept. Aware to provide a copy of the vaccination record if obtained from local pharmacy or Health Dept. Verbalized acceptance and understanding.  Pneumococcal vaccine status: Declined,  Education has been provided regarding the importance of this vaccine but patient still declined. Advised may receive this vaccine at local pharmacy or Health Dept. Aware to provide a copy of the vaccination record if obtained from local pharmacy or Health Dept. Verbalized acceptance and understanding.   Covid-19 vaccine status: Declined, Education has been provided regarding the importance of this vaccine but patient still declined. Advised may receive  this vaccine at local pharmacy or Health Dept.or vaccine clinic. Aware to provide a copy of the vaccination record if obtained from local pharmacy or Health Dept. Verbalized acceptance and understanding.  Qualifies for Shingles Vaccine? Yes   Zostavax completed No   Shingrix Completed?: No.    Education has been provided regarding the importance of this vaccine. Patient has been advised to call insurance company to determine out of pocket expense if they have not yet received this vaccine. Advised may also receive vaccine at local pharmacy or Health Dept. Verbalized acceptance and understanding.  Screening Tests Health Maintenance  Topic Date Due   COVID-19 Vaccine (1 - 2024-25 season) Never done   INFLUENZA  VACCINE  02/01/2024 (Originally 06/04/2023)   Zoster Vaccines- Shingrix (1 of 2) 09/01/2024 (Originally 08/02/2014)   COLON CANCER SCREENING ANNUAL FOBT  07/01/2027 (Originally 06/14/2023)   HEMOGLOBIN A1C  02/12/2024   OPHTHALMOLOGY EXAM  04/12/2024   Diabetic kidney evaluation - Urine ACR  05/10/2024   Diabetic kidney evaluation - eGFR measurement  08/13/2024   FOOT EXAM  08/22/2024   Medicare Annual Wellness (AWV)  10/25/2024   MAMMOGRAM  08/24/2025   Cervical Cancer Screening (HPV/Pap Cotest)  06/19/2026   Colonoscopy  06/13/2032   Hepatitis C Screening  Completed   HIV Screening  Completed   HPV VACCINES  Aged Out   DTaP/Tdap/Td  Discontinued    Health Maintenance  Health Maintenance Due  Topic Date Due   COVID-19 Vaccine (1 - 2024-25 season) Never done    Colorectal cancer screening: Type of screening: Colonoscopy. Completed 06/13/2022. Repeat every 10 years  Mammogram status: Completed 08/25/2023. Repeat every year  Bone Density: declines"wants to talk with PCP about this  Lung Cancer Screening: (Low Dose CT Chest recommended if Age 46-80 years, 20 pack-year currently smoking OR have quit w/in 15years.) does not qualify.   Lung Cancer Screening Referral: no  Additional Screening:  Hepatitis C Screening: does not qualify; Completed 12/13/2021  Vision Screening: Recommended annual ophthalmology exams for early detection of glaucoma and other disorders of the eye. Is the patient up to date with their annual eye exam?  Yes  Who is the provider or what is the name of the office in which the patient attends annual eye exams? Dr Charise Killian If pt is not established with a provider, would they like to be referred to a provider to establish care? No .   Dental Screening: Recommended annual dental exams for proper oral hygiene  Diabetic Foot Exam: Diabetic Foot Exam: Completed 08/05/23  Community Resource Referral / Chronic Care Management: CRR required this visit?  No   CCM  required this visit?  No     Plan:     I have personally reviewed and noted the following in the patient's chart:   Medical and social history Use of alcohol, tobacco or illicit drugs  Current medications and supplements including opioid prescriptions. Patient is not currently taking opioid prescriptions. Functional ability and status Nutritional status Physical activity Advanced directives List of other physicians Hospitalizations, surgeries, and ER visits in previous 12 months Vitals Screenings to include cognitive, depression, and falls Referrals and appointments  In addition, I have reviewed and discussed with patient certain preventive protocols, quality metrics, and best practice recommendations. A written personalized care plan for preventive services as well as general preventive health recommendations were provided to patient.     Sue Lush, LPN   [id number]/[id number]   After Visit Summary: (MyChart) Due to this being  a telephonic visit, the after visit summary with patients personalized plan was offered to patient via MyChart   Nurse Notes: The patient states she is having some rt sided facial swelling and itching for 4-5 days now. She denies any pain or SOB. She relays she called the office and was told to take Benadryl but relays it is not helping. I asked pt to call the office and make appt with a provider to be seen. She relays she will do. She has no concerns or questions at this time.   *Pt noted to have appt today w/Dr Allena Katz

## 2023-10-26 NOTE — Progress Notes (Signed)
Acute Office Visit  Subjective:    Patient ID: Kelli Spencer, female    DOB: 1966-07-02, 57 y.o.   MRN: [id number]  Chief Complaint  Patient presents with   Facial Swelling    Facial swelling for five days    HPI Patient is in today for complaint of facial pain since 10/21/23, that started in the periorbital area.  She also had itching, which improved with Benadryl.  She denies any new medicine recently.  Denies any unusual food intake recently.  Denies any local rash.  Does not report any change in soap, lotion, detergent or cleaning supplies.  Denies any visual disturbance, lip swelling, dyspnea or wheezing.  Past Medical History:  Diagnosis Date   Arthritis    Asthma    Diabetes mellitus without complication (HCC)    Diastolic hypertension    GERD (gastroesophageal reflux disease)    Hyperlipidemia    Hypertension    Migraines    Obesity    Seizures (HCC)    pt states she has seizures only when she gets upset. pt is taking no seizure meds    Past Surgical History:  Procedure Laterality Date   ABDOMINAL HYSTERECTOMY  1997   fibroids   BALLOON DILATION N/A 01/25/2021   Procedure: BALLOON DILATION;  Surgeon: Lanelle Bal, DO;  [facility];  Service: Endoscopy;  Laterality: N/A;   BIOPSY  01/25/2021   Procedure: BIOPSY;  Surgeon: Lanelle Bal, DO;  [facility];  Service: Endoscopy;;   COLONOSCOPY N/A 06/23/2016   Dr. Darrick Penna: prep not adequate to detect polyps less than 6mm, redundant left colon. hemorrhoids. next TCS in 5 years.    COLONOSCOPY WITH PROPOFOL N/A 06/13/2022   Procedure: COLONOSCOPY WITH PROPOFOL;  Surgeon: Lanelle Bal, DO;  [facility];  Service: Endoscopy;  Laterality: N/A;  11:00am, asa 3   ESOPHAGOGASTRODUODENOSCOPY N/A 04/02/2018   Dr. Darrick Penna: Benign-appearing esophageal stricture status post dilation, multiple benign gastric polyps, mild gastritis no H. pylori.   ESOPHAGOGASTRODUODENOSCOPY (EGD) WITH  PROPOFOL N/A 01/25/2021   Benign-appearing esophageal stenosis status post dilation, gastritis with no H. pylori.  Procedure: ESOPHAGOGASTRODUODENOSCOPY (EGD) WITH PROPOFOL;  Surgeon: Lanelle Bal, DO;  [facility];  Service: Endoscopy;  Laterality: N/A;  am appt   KNEE ARTHROSCOPY WITH MEDIAL MENISECTOMY Left 02/09/2014   Procedure: KNEE ARTHROSCOPY WITH MEDIAL MENISECTOMY;  Surgeon: Vickki Hearing, MD;  Location: [facility];  Service: Orthopedics;  Laterality: Left;   PARTIAL HYSTERECTOMY     POLYPECTOMY  06/13/2022   Procedure: POLYPECTOMY;  Surgeon: Lanelle Bal, DO;  [facility];  Service: Endoscopy;;   SAVORY DILATION N/A 04/02/2018   Procedure: SAVORY DILATION;  Surgeon: West Bali, MD;  [facility];  Service: Endoscopy;  Laterality: N/A;   TOTAL KNEE ARTHROPLASTY Right 03/18/2017   Procedure: TOTAL KNEE ARTHROPLASTY;  Surgeon: Vickki Hearing, MD;  Location: [facility];  Service: Orthopedics;  Laterality: Right;    Family History  Problem Relation Age of Onset   Kidney failure Mother    Diabetes Mother    Hypertension Mother    Stroke Mother    Diabetes Father    Heart disease Father    Hypertension Sister    Colon cancer Neg Hx     Social History   Socioeconomic History   Marital status: Divorced    Spouse name: Not on file   Number of children: 3   Years  of education: Not on file   Highest education level: 9th grade  Occupational History   Occupation: disabled  Tobacco Use   Smoking status: Never   Smokeless tobacco: Never  Vaping Use   Vaping status: Never Used  Substance and Sexual Activity   Alcohol use: No   Drug use: No   Sexual activity: Not Currently  Other Topics Concern   Not on file  Social History Narrative   Daughter currently living with her until she can get her own place.    Social Drivers of Corporate investment banker Strain: Low Risk  (10/26/2023)   Overall Financial Resource Strain  (CARDIA)    Difficulty of Paying Living Expenses: Not hard at all  Food Insecurity: No Food Insecurity (10/26/2023)   Hunger Vital Sign    Worried About Running Out of Food in the Last Year: Never true    Ran Out of Food in the Last Year: Never true  Transportation Needs: No Transportation Needs (10/26/2023)   PRAPARE - Administrator, Civil Service (Medical): No    Lack of Transportation (Non-Medical): No  Physical Activity: Inactive (10/26/2023)   Exercise Vital Sign    Days of Exercise per Week: 0 days    Minutes of Exercise per Session: 0 min  Stress: No Stress Concern Present (10/26/2023)   [employer] of Occupational Health - Occupational Stress Questionnaire    Feeling of Stress : Not at all  Social Connections: Moderately Isolated (10/26/2023)   Social Connection and Isolation Panel [NHANES]    Frequency of Communication with Friends and Family: More than three times a week    Frequency of Social Gatherings with Friends and Family: Once a week    Attends Religious Services: More than 4 times per year    Active Member of [employer] or Organizations: No    Attends Banker Meetings: Never    Marital Status: Divorced  Catering manager Violence: Not At Risk (10/26/2023)   Humiliation, Afraid, Rape, and Kick questionnaire    Fear of Current or Ex-Partner: No    Emotionally Abused: No    Physically Abused: No    Sexually Abused: No    Outpatient Medications Prior to Visit  Medication Sig Dispense Refill   ACCU-CHEK GUIDE test strip USE TO CHECK BLOOD SUGAR TWICE DAILY 100 strip 0   Accu-Chek Softclix Lancets lancets USE TO TEST blood sugar twice daily  Dx E11.65 100 each 5   aspirin-sod bicarb-citric acid (ALKA-SELTZER) 325 MG TBEF tablet Take 650 mg by mouth at bedtime as needed (indigestion).     blood glucose meter kit and supplies Dispense based on patient and insurance preference. Once daily testing dx e11.9 1 each 0   cetirizine (ZYRTEC) 10 MG  tablet Take 10 mg by mouth at bedtime as needed for allergies.     cholecalciferol (VITAMIN D) 400 UNITS TABS tablet Take 800 Units by mouth daily.     glipiZIDE (GLUCOTROL) 10 MG tablet TAKE 1 TABLET BY MOUTH  TWICE DAILY BEFORE MEALS 200 tablet 2   hydrochlorothiazide (MICROZIDE) 12.5 MG capsule TAKE 1 CAPSULE BY MOUTH DAILY 100 capsule 2   lubiprostone (AMITIZA) 24 MCG capsule TAKE 1 CAPSULE BY MOUTH TWICE  DAILY WITH MEALS 200 capsule 2   metFORMIN (GLUCOPHAGE) 500 MG tablet Take 1 tablet (500 mg total) by mouth daily with breakfast. 90 tablet 3   pravastatin (PRAVACHOL) 10 MG tablet TAKE 1 TABLET BY MOUTH DAILY 100 tablet 2  RABEprazole (ACIPHEX) 20 MG tablet TAKE 1 TABLET BY MOUTH TWICE  DAILY BEFORE MEALS 200 tablet 2   Semaglutide (RYBELSUS) 3 MG TABS TAKE 1 TABLET BY MOUTH EVERY DAY FOR DIABETES 90 tablet 3   No facility-administered medications prior to visit.    Allergies  Allergen Reactions   Prednisone Swelling and Other (See Comments)    Made mouth and throat feel numb   Amlodipine Itching and Swelling   Ace Inhibitors Cough    Review of Systems  Constitutional:  Negative for chills and fever.  HENT:  Negative for congestion, sinus pressure, sinus pain and sore throat.   Eyes:  Negative for pain and discharge.  Respiratory:  Negative for cough and shortness of breath.   Cardiovascular:  Negative for chest pain and palpitations.  Gastrointestinal:  Negative for abdominal pain, diarrhea, nausea and vomiting.  Endocrine: Negative for polydipsia and polyuria.  Genitourinary:  Negative for dysuria and hematuria.  Musculoskeletal:  Negative for neck pain and neck stiffness.  Skin:  Negative for rash.  Neurological:  Negative for dizziness and weakness.  Psychiatric/Behavioral:  Negative for agitation and behavioral problems.        Objective:    Physical Exam Constitutional:      Appearance: She is obese.  HENT:     Head: Normocephalic and atraumatic.      Comments: Mild facial edema, no tenderness    Nose: No congestion.     Mouth/Throat:     Mouth: Mucous membranes are moist.     Pharynx: No posterior oropharyngeal erythema.  Eyes:     General: No scleral icterus.    Extraocular Movements: Extraocular movements intact.  Cardiovascular:     Rate and Rhythm: Normal rate and regular rhythm.     Heart sounds: Normal heart sounds. No murmur heard. Pulmonary:     Breath sounds: Normal breath sounds. No wheezing or rales.  Skin:    General: Skin is warm.     Findings: No rash.  Neurological:     General: No focal deficit present.     Mental Status: She is alert and oriented to person, place, and time.  Psychiatric:        Mood and Affect: Mood normal.        Behavior: Behavior normal.     BP 138/82 (BP Location: Left Arm)   Pulse 74   Ht 5' (1.524 m)   Wt 178 lb 9.6 oz (81 kg)   LMP 06/10/1988 (Approximate)   SpO2 98%   BMI 34.88 kg/m  Wt Readings from Last 3 Encounters:  10/26/23 178 lb 9.6 oz (81 kg)  10/26/23 180 lb (81.6 kg)  08/21/23 180 lb (81.6 kg)        Assessment & Plan:   Problem List Items Addressed This Visit       Other   Facial swelling - Primary   Unclear etiology, could be allergic to food particle or other chemical exposure Advised to keep food diary Hydroxyzine as needed for itching Avoided oral steroid due to type 2 DM If persistent, will need allergy testing      Relevant Medications   hydrOXYzine (ATARAX) 10 MG tablet     Meds ordered this encounter  Medications   hydrOXYzine (ATARAX) 10 MG tablet    Sig: Take 1 tablet (10 mg total) by mouth 3 (three) times daily as needed for itching.    Dispense:  30 tablet    Refill:  0  Anabel Halon, MD

## 2023-10-26 NOTE — Patient Instructions (Addendum)
Please take Hydroxyzine as needed for itching and swelling.  Please keep food diary to see if any particular food particle is causing allergic reaction.  Please get immediate medical attention if you notice lip swelling, difficulty breathing or swallowing.

## 2023-10-26 NOTE — Telephone Encounter (Addendum)
Copied from CRM #[id number]. Topic: Clinical - Red Word Triage >> Oct 26, 2023  9:30 AM Geroge Baseman wrote: Red Word that prompted transfer to Nurse Triage: Patient states she has swelling due to possible medication allergy   Chief Complaint: facial swelling Symptoms: itching, pain Tuesday Frequency: ongoing since Tuesday  Pertinent Negatives: Patient denies fever, toothache, lip swelling  Disposition: [] ED /[] Urgent Care (no appt availability in office) / [x] Appointment(In office/virtual)/ []  [facility]/ [] Home Care/ [] Refused Recommended Disposition /[] [facility]/ []  Follow-up with PCP Additional Notes: The patient reported facial swelling that started on her right eye Tuesday and progressively spread to her left eye and both cheeks.  She reported facial itching.  She experienced right eye pain Tuesday that has since resolved. She is concerned that it could be a medication reaction but has not had any medication changes.  She has not eaten anything that she does not normally eat.  Her face washes are the same. She took Benadryl because her face was itching; it helped the itching but the swelling did not go down.  She was scheduled for a same day appointment for further evaluation.   Reason for Disposition  [1] Mild face swelling (puffiness) AND [2] persists > 3 days  Answer Assessment - Initial Assessment Questions 1. ONSET: "When did the swelling start?" (e.g., minutes, hours, days)     Tuesday right eye  Thursday other side of face was swollen -  has gone down around eyes but my face still looks swollen 2. LOCATION: "What part of the face is swollen?"     Around eyes and cheeks 3. SEVERITY: "How swollen is it?"     Others noticed 4. ITCHING: "Is there any itching?" If Yes, ask: "How much?"   (Scale 1-10; mild, moderate or severe)     5/10 - rubbing and not scratching  5. PAIN: "Is the swelling painful to touch?" If Yes, ask: "How painful is it?"   (Scale 1-10; mild,  moderate or severe)   - NONE (0): no pain   - MILD (1-3): doesn't interfere with normal activities    - MODERATE (4-7): interferes with normal activities or awakens from sleep    - SEVERE (8-10): excruciating pain, unable to do any normal activities      Right side of eye was painful Tuesday and Wednesday but not hurting anymore  6. FEVER: "Do you have a fever?" If Yes, ask: "What is it, how was it measured, and when did it start?"      no 7. CAUSE: "What do you think is causing the face swelling?"     Possible medication reaction but has not changed meds  Has not eaten anything different  8. RECURRENT SYMPTOM: "Have you had face swelling before?" If Yes, ask: "When was the last time?" "What happened that time?"     No 9. OTHER SYMPTOMS: "Do you have any other symptoms?" (e.g., toothache, leg swelling)     No  Protocols used: Face Swelling-A-AH

## 2023-10-26 NOTE — Assessment & Plan Note (Addendum)
Unclear etiology, could be allergic to food particle or other chemical exposure Advised to keep food diary Hydroxyzine as needed for itching Avoided oral steroid due to type 2 DM If persistent, will need allergy testing

## 2023-10-26 NOTE — Patient Instructions (Signed)
Kelli Spencer , Thank you for taking time to come for your Medicare Wellness Visit. I appreciate your ongoing commitment to your health goals. Please review the following plan we discussed and let me know if I can assist you in the future.   Referrals/Orders/Follow-Ups/Clinician Recommendations: none  This is a list of the screening recommended for you and due dates:  Health Maintenance  Topic Date Due   COVID-19 Vaccine (1 - 2024-25 season) Never done   Flu Shot  02/01/2024*   Zoster (Shingles) Vaccine (1 of 2) 09/01/2024*   Stool Blood Test  07/01/2027*   Hemoglobin A1C  02/12/2024   Eye exam for diabetics  04/12/2024   Yearly kidney health urinalysis for diabetes  05/10/2024   Yearly kidney function blood test for diabetes  08/13/2024   Complete foot exam   08/22/2024   Medicare Annual Wellness Visit  10/25/2024   Mammogram  08/24/2025   Pap with HPV screening  06/19/2026   Colon Cancer Screening  06/13/2032   Hepatitis C Screening  Completed   HIV Screening  Completed   HPV Vaccine  Aged Out   DTaP/Tdap/Td vaccine  Discontinued  *Topic was postponed. The date shown is not the original due date.    Advanced directives: (Declined) Advance directive discussed with you today. Even though you declined this today, please call our office should you change your mind, and we can give you the proper paperwork for you to fill out.  Next Medicare Annual Wellness Visit scheduled for next year: Yes 11/02/24 @ 8:40am televisit

## 2023-10-28 ENCOUNTER — Other Ambulatory Visit: Payer: Self-pay | Admitting: Family Medicine

## 2023-12-01 ENCOUNTER — Other Ambulatory Visit: Payer: Self-pay | Admitting: Gastroenterology

## 2023-12-02 ENCOUNTER — Other Ambulatory Visit: Payer: Self-pay

## 2023-12-02 MED ORDER — RYBELSUS 3 MG PO TABS: ORAL_TABLET | ORAL | 3 refills | Status: DC

## 2023-12-02 NOTE — Telephone Encounter (Signed)
Refill sent but she needs follow up ov since we have not seen her since 04/2022.

## 2023-12-02 NOTE — Telephone Encounter (Signed)
Pt was made aware and f/u appt was made

## 2023-12-08 ENCOUNTER — Other Ambulatory Visit: Payer: Self-pay | Admitting: Internal Medicine

## 2023-12-08 DIAGNOSIS — R220 Localized swelling, mass and lump, head: Secondary | ICD-10-CM

## 2023-12-10 ENCOUNTER — Other Ambulatory Visit: Payer: Self-pay | Admitting: Family Medicine

## 2023-12-12 ENCOUNTER — Other Ambulatory Visit: Payer: Self-pay | Admitting: Family Medicine

## 2023-12-18 ENCOUNTER — Telehealth: Payer: Self-pay | Admitting: Gastroenterology

## 2023-12-18 ENCOUNTER — Encounter: Payer: Self-pay | Admitting: Gastroenterology

## 2023-12-18 ENCOUNTER — Ambulatory Visit: Payer: 59 | Admitting: Gastroenterology

## 2023-12-18 ENCOUNTER — Other Ambulatory Visit (HOSPITAL_COMMUNITY)
Admission: RE | Admit: 2023-12-18 | Discharge: 2023-12-18 | Disposition: A | Discharge status: Home / Self Care | Payer: 59 | Source: Ambulatory Visit | Attending: Family Medicine | Admitting: Family Medicine

## 2023-12-18 VITALS — BP 138/72 | HR 71 | Temp 97.8°F | Ht 60.0 in | Wt 183.2 lb

## 2023-12-18 DIAGNOSIS — E1159 Type 2 diabetes mellitus with other circulatory complications: Secondary | ICD-10-CM | POA: Diagnosis not present

## 2023-12-18 DIAGNOSIS — K760 Fatty (change of) liver, not elsewhere classified: Secondary | ICD-10-CM | POA: Diagnosis not present

## 2023-12-18 DIAGNOSIS — K5900 Constipation, unspecified: Secondary | ICD-10-CM | POA: Diagnosis not present

## 2023-12-18 DIAGNOSIS — K219 Gastro-esophageal reflux disease without esophagitis: Secondary | ICD-10-CM | POA: Diagnosis not present

## 2023-12-18 LAB — BASIC METABOLIC PANEL
Anion gap: 12 (ref 5–15)
BUN: 17 mg/dL (ref 6–20)
CO2: 29 mmol/L (ref 22–32)
Calcium: 9.8 mg/dL (ref 8.9–10.3)
Chloride: 98 mmol/L (ref 98–111)
Creatinine, Ser: 1.06 mg/dL — ABNORMAL HIGH (ref 0.44–1.00)
GFR, Estimated: >60 mL/min (ref >60)
Glucose, Bld: 102 mg/dL — ABNORMAL HIGH (ref 70–99)
Potassium: 4 mmol/L (ref 3.5–5.1)
Sodium: 139 mmol/L (ref 135–145)

## 2023-12-18 LAB — HEMOGLOBIN A1C
Hgb A1c MFr Bld: 7 % — ABNORMAL HIGH (ref 4.8–5.6)
Mean Plasma Glucose: 154.2 mg/dL

## 2023-12-18 NOTE — Patient Instructions (Signed)
Continue lubiprostone (Amitiza) twice daily for constipation. Continue rabeprazole (Aciphex) twice daily before meals for acid reflux. You can try elevating the head of your bed on bricks or using a pillow wedge to try to help with nighttime reflux symptoms. We will plan to see you back in 1 year, please call sooner if you are having any questions or concerns.

## 2023-12-18 NOTE — Telephone Encounter (Signed)
Patient seen in office, after she left, I noted she had u/s in 2014 with fatty liver.  Her NAFLD fibrosis score is indeterminant for level of possible fibrosis from fatty liver. I would recommend abd u/s with elastography if she is agreeable.  Due to her having DM, she is at risk of fatty liver, and progressive fibrosis (stiffness) of the liver. She needs to continue to control her sugars, keep A1C low, try to exercise daily, try for healthier weight, eat low fat/low sugar.   If she is willing please schedule u/s as above.

## 2023-12-18 NOTE — Progress Notes (Signed)
GI Office Note    Referring Provider: Kerri Perches, MD Primary Care Physician:  Kerri Perches, MD  Primary Gastroenterologist: Hennie Duos. Marletta Lor, DO   Chief Complaint   Chief Complaint  Patient presents with   Follow-up    Follow up. Still having problems with acid reflux     History of Present Illness   Kelli Spencer is a 58 y.o. female presenting today for follow-up for refills.  Last seen June 2023.  She has a history of GERD, dysphagia, constipation.  Doing well from a GI standpoint.  Using Amitiza 4 mcg twice daily with good control of constipation.  Has a daily bowel movement.  No melena or rectal bleeding.  Reflux doing well overall on Aciphex 20 mg twice daily.  She does have occasional breakthrough symptoms, typically worse when she lays down.  Using over-the-counter antacids.  She tries to be careful allowing at least 3 hours between eating and laying down but sometimes continues to have breakthrough symptoms.  Described as mild.  No dysphagia.  No vomiting.  No melena or rectal bleeding.  No abdominal pain.  No unintentional weight loss.   EGD 01/2021: - Z-line regular, 38 cm from the incisors. - Benign-appearing esophageal stenosis. Dilated. - Gastritis. Biopsied. Mild chronic gastritis, no H.pylori - Normal duodenal bulb, first portion of the duodenum and second portion of the duodenum.   Colonoscopy August 2023: -Nonbleeding internal hemorrhoids -two 3 to 4 mm polyps in the ascending colon, tubular adenoma -Repeat colonoscopy in 5 years   Medications   Current Outpatient Medications  Medication Sig Dispense Refill   ACCU-CHEK GUIDE TEST test strip USE TO CHECK BLOOD SUGAR TWICE DAILY 100 strip 0   Accu-Chek Softclix Lancets lancets USE TO TEST blood sugar twice daily  Dx E11.65 100 each 5   aspirin-sod bicarb-citric acid (ALKA-SELTZER) 325 MG TBEF tablet Take 650 mg by mouth at bedtime as needed (indigestion).     blood glucose meter kit and  supplies Dispense based on patient and insurance preference. Once daily testing dx e11.9 1 each 0   cetirizine (ZYRTEC) 10 MG tablet Take 10 mg by mouth at bedtime as needed for allergies.     cholecalciferol (VITAMIN D) 400 UNITS TABS tablet Take 800 Units by mouth daily.     glipiZIDE (GLUCOTROL) 10 MG tablet TAKE 1 TABLET BY MOUTH TWICE  DAILY BEFORE MEALS 200 tablet 2   hydrochlorothiazide (MICROZIDE) 12.5 MG capsule TAKE 1 CAPSULE BY MOUTH DAILY 100 capsule 2   hydrOXYzine (ATARAX) 10 MG tablet TAKE 1 TABLET(10 MG) BY MOUTH THREE TIMES DAILY AS NEEDED FOR ITCHING 30 tablet 0   lubiprostone (AMITIZA) 24 MCG capsule TAKE 1 CAPSULE BY MOUTH TWICE  DAILY WITH MEALS 200 capsule 2   metFORMIN (GLUCOPHAGE) 500 MG tablet Take 1 tablet (500 mg total) by mouth daily with breakfast. 90 tablet 3   pravastatin (PRAVACHOL) 10 MG tablet TAKE 1 TABLET BY MOUTH DAILY 100 tablet 2   RABEprazole (ACIPHEX) 20 MG tablet TAKE 1 TABLET BY MOUTH TWICE  DAILY BEFORE MEALS 200 tablet 2   Semaglutide (RYBELSUS) 3 MG TABS TAKE 1 TABLET BY MOUTH EVERY DAY FOR DIABETES 90 tablet 3   No current facility-administered medications for this visit.    Allergies   Allergies as of 12/18/2023 - Review Complete 12/18/2023  Allergen Reaction Noted   Prednisone Swelling and Other (See Comments) 06/09/2013   Amlodipine Itching and Swelling 11/05/2017   Ace inhibitors  Cough 07/04/2016       Review of Systems   General: Negative for anorexia, weight loss, fever, chills, fatigue, weakness. ENT: Negative for hoarseness, difficulty swallowing , nasal congestion. CV: Negative for chest pain, angina, palpitations, dyspnea on exertion, peripheral edema.  Respiratory: Negative for dyspnea at rest, dyspnea on exertion, cough, sputum, wheezing.  GI: See history of present illness. GU:  Negative for dysuria, hematuria, urinary incontinence, urinary frequency, nocturnal urination.  Endo: Negative for unusual weight change.      Physical Exam   BP 138/72 (BP Location: Right Arm, Patient Position: Sitting, Cuff Size: Large)   Pulse 71   Temp 97.8 F (36.6 C) (Temporal)   Ht 5' (1.524 m)   Wt 183 lb 3.2 oz (83.1 kg)   LMP 06/10/1988 (Approximate)   BMI 35.78 kg/m    General: Well-nourished, well-developed in no acute distress.  Eyes: No icterus. Mouth: Oropharyngeal mucosa moist and pink   Abdomen: Bowel sounds are normal, nontender, nondistended, no hepatosplenomegaly or masses,  no abdominal bruits or hernia , no rebound or guarding.  Rectal: not Performed Extremities: No lower extremity edema. No clubbing or deformities. Neuro: Alert and oriented x 4   Skin: Warm and dry, no jaundice.   Psych: Alert and cooperative, normal mood and affect.  Labs   Lab Results  Component Value Date   NA 132 (L) 08/14/2023   CL 96 (L) 08/14/2023   K 3.7 08/14/2023   CO2 28 08/14/2023   BUN 14 08/14/2023   CREATININE 1.06 (H) 08/14/2023   GFRNONAA >60 08/14/2023   CALCIUM 9.3 08/14/2023   ALBUMIN 4.1 08/14/2023   GLUCOSE 87 08/14/2023   Lab Results  Component Value Date   ALT 17 08/14/2023   AST 18 08/14/2023   ALKPHOS 96 08/14/2023   BILITOT 0.6 08/14/2023   Lab Results  Component Value Date   HGBA1C 6.8 (H) 08/14/2023   Lab Results  Component Value Date   TSH 3.090 08/14/2023   Lab Results  Component Value Date   WBC 5.8 08/14/2023   HGB 12.8 08/14/2023   HCT 38.1 08/14/2023   MCV 89.9 08/14/2023   PLT 325 08/14/2023    Imaging Studies   No results found.  Assessment/plan:   GERD: -Doing well on rabeprazole 20 mg twice daily before meals -Does have some occasional nocturnal breakthrough symptoms when she lays down at night -Consider elevating head of bed with either bricks or using a pillow wedge -Reinforced antireflux measures  Constipation: -Doing well on lubiprostone 24 mcg twice daily  Fatty liver: -seen on remote ultrasound in 2014 -NAFLD fibrosis score using 08/2023 data  with indeterminant score  -noted after patient left office, we will offer her ultrasound with elastography to evaluate for fibrosis -Continue tight glycemic control, weight loss measures, try to get regular daily exercise, limit alcohol use     Leanna Battles. Melvyn Neth, MHS, PA-C [facility]

## 2023-12-21 NOTE — Telephone Encounter (Signed)
 No ans, vm full

## 2023-12-22 ENCOUNTER — Other Ambulatory Visit: Payer: Self-pay | Admitting: *Deleted

## 2023-12-22 DIAGNOSIS — K760 Fatty (change of) liver, not elsewhere classified: Secondary | ICD-10-CM

## 2023-12-22 NOTE — Telephone Encounter (Signed)
 Pt was made aware and verbalized understanding. Pt is agreeable and is ready to move forward with scheduling. Pt is wanting it to be scheduled on a Friday due to babysitting her great niece.

## 2023-12-22 NOTE — Telephone Encounter (Signed)
 Pt informed us is scheduled for 01/01/24, arrive at 8:15 am, NPO after midnight. Verbalized understanding

## 2023-12-24 NOTE — Telephone Encounter (Signed)
 Copied from CRM [id number]. Topic: Appointments - Appointment Info/Confirmation >> Dec 24, 2023  1:11 PM Kelli Spencer E wrote: Patient/patient representative is calling for information regarding an appointment.

## 2023-12-25 ENCOUNTER — Ambulatory Visit: Payer: 59 | Admitting: Family Medicine

## 2024-01-01 ENCOUNTER — Ambulatory Visit (HOSPITAL_COMMUNITY)
Admission: RE | Admit: 2024-01-01 | Discharge: 2024-01-01 | Disposition: A | Discharge status: Home / Self Care | Payer: 59 | Source: Ambulatory Visit | Attending: Gastroenterology | Admitting: Gastroenterology

## 2024-01-01 DIAGNOSIS — K760 Fatty (change of) liver, not elsewhere classified: Secondary | ICD-10-CM | POA: Insufficient documentation

## 2024-02-01 ENCOUNTER — Other Ambulatory Visit: Payer: Self-pay

## 2024-02-01 DIAGNOSIS — K760 Fatty (change of) liver, not elsewhere classified: Secondary | ICD-10-CM

## 2024-02-02 ENCOUNTER — Other Ambulatory Visit: Payer: Self-pay | Admitting: Gastroenterology

## 2024-02-02 DIAGNOSIS — K219 Gastro-esophageal reflux disease without esophagitis: Secondary | ICD-10-CM

## 2024-02-05 ENCOUNTER — Ambulatory Visit: Payer: 59 | Admitting: Family Medicine

## 2024-02-05 VITALS — BP 115/76 | HR 79 | Resp 16 | Ht 60.0 in | Wt 183.0 lb

## 2024-02-05 DIAGNOSIS — Z1231 Encounter for screening mammogram for malignant neoplasm of breast: Secondary | ICD-10-CM

## 2024-02-05 DIAGNOSIS — E785 Hyperlipidemia, unspecified: Secondary | ICD-10-CM | POA: Diagnosis not present

## 2024-02-05 DIAGNOSIS — E1159 Type 2 diabetes mellitus with other circulatory complications: Secondary | ICD-10-CM | POA: Diagnosis not present

## 2024-02-05 DIAGNOSIS — E559 Vitamin D deficiency, unspecified: Secondary | ICD-10-CM

## 2024-02-05 DIAGNOSIS — K219 Gastro-esophageal reflux disease without esophagitis: Secondary | ICD-10-CM

## 2024-02-05 DIAGNOSIS — K5900 Constipation, unspecified: Secondary | ICD-10-CM

## 2024-02-05 DIAGNOSIS — I10 Essential (primary) hypertension: Secondary | ICD-10-CM | POA: Diagnosis not present

## 2024-02-05 MED ORDER — METFORMIN HCL 500 MG PO TABS: 500.0000 mg | ORAL_TABLET | Freq: Two times a day (BID) | ORAL | 3 refills | Status: DC

## 2024-02-05 NOTE — Assessment & Plan Note (Addendum)
 Diabetes associated with hypertension, hyperlipidemia, and obesity  [name] is reminded of the importance of commitment to daily physical activity for 30 minutes or more, as able and the need to limit carbohydrate intake to 30 to 60 grams per meal to help with blood sugar control.  Not at goal incrase metformin to twice daily  The need to take medication as prescribed, test blood sugar as directed, and to call between visits if there is a concern that blood sugar is uncontrolled is also discussed.   [name] is reminded of the importance of daily foot exam, annual eye examination, and good blood sugar, blood pressure and cholesterol control.     Latest Ref Rng & Units 12/18/2023   10:11 AM 12/18/2023   10:10 AM 08/14/2023    9:56 AM 05/11/2023    9:16 AM 01/05/2023    9:19 AM  Diabetic Labs  HbA1c 4.8 - 5.6 %  7.0  6.8  7.1  7.2   Microalbumin Not Estab. ug/mL    3.1    Micro/Creat Ratio 0 - 29 mg/g creat    3    Chol 0 - 200 mg/dL   098   119   HDL >14 mg/dL   42   42   Calc LDL 0 - 99 mg/dL   53   66   Triglycerides <150 mg/dL   58   52   Creatinine 0.44 - 1.00 mg/dL 7.82   9.56  2.13  0.86       02/05/2024    8:36 AM 12/18/2023    8:57 AM 10/26/2023   10:53 AM 10/26/2023   10:20 AM 10/26/2023    8:37 AM 08/21/2023    8:25 AM 05/15/2023    8:02 AM  BP/Weight  Systolic BP 115 138 138 142 -- 123 108  Diastolic BP 76 72 82 89 -- 86 74  Wt. (Lbs) 183.04 183.2  178.6 180 180 181  BMI 35.75 kg/m2 35.78 kg/m2  34.88 kg/m2 35.15 kg/m2 35.15 kg/m2 35.35 kg/m2      Latest Ref Rng & Units 08/21/2023    8:20 AM 04/13/2023   12:00 AM  Foot/eye exam completion dates  Eye Exam No Retinopathy  No Retinopathy      Foot Form Completion  Done      This result is from an external source.

## 2024-02-05 NOTE — Assessment & Plan Note (Signed)
 Hyperlipidemia:Low fat diet discussed and encouraged.   Lipid Panel  Lab Results  Component Value Date   CHOL 107 08/14/2023   HDL 42 08/14/2023   LDLCALC 53 08/14/2023   TRIG 58 08/14/2023   CHOLHDL 2.5 08/14/2023     Updated lab needed at/ before next visit.

## 2024-02-05 NOTE — Patient Instructions (Signed)
 Follow-up in 13 weeks, call if you need me sooner.  Dose increase in metformin to 1 tablet twice daily.  Blood sugar is above goal  Please commit to changing food choices cutting out sweets and also start doing exercises every day for 15 minutes building up to 30 minutes.  Fasting lipid CMP and EGFR and HbA1c to be drawn 3 to 5 days before your next appointment.  Please schedule mammogram at checkout.   Thanks for choosing [facility], we consider it a privelige to serve you.

## 2024-02-06 ENCOUNTER — Encounter: Payer: Self-pay | Admitting: Family Medicine

## 2024-02-06 DIAGNOSIS — Z1231 Encounter for screening mammogram for malignant neoplasm of breast: Secondary | ICD-10-CM | POA: Insufficient documentation

## 2024-02-06 NOTE — Assessment & Plan Note (Signed)
 DASH diet and commitment to daily physical activity for a minimum of 30 minutes discussed and encouraged, as a part of hypertension management. The importance of attaining a healthy weight is also discussed.     02/05/2024    8:36 AM 12/18/2023    8:57 AM 10/26/2023   10:53 AM 10/26/2023   10:20 AM 10/26/2023    8:37 AM 08/21/2023    8:25 AM 05/15/2023    8:02 AM  BP/Weight  Systolic BP 115 138 138 142 -- 123 108  Diastolic BP 76 72 82 89 -- 86 74  Wt. (Lbs) 183.04 183.2  178.6 180 180 181  BMI 35.75 kg/m2 35.78 kg/m2  34.88 kg/m2 35.15 kg/m2 35.15 kg/m2 35.35 kg/m2     Controlled, no change in medication

## 2024-02-06 NOTE — Assessment & Plan Note (Signed)
 Controlled, no change in medication

## 2024-02-06 NOTE — Progress Notes (Signed)
 Kelli Spencer     MRN: [id number]      DOB: Sep 11, 1966  Chief Complaint  Patient presents with   Diabetes    Follow up     HPI Kelli Spencer is here for follow up and re-evaluation of chronic medical conditions, medication management and review of any available recent lab and radiology data.  Preventive health is updated, specifically  Cancer screening and Immunization.   Questions or concerns regarding consultations or procedures which the PT has had in the interim are  addressed. The PT denies any adverse reactions to current medications since the last visit.  There are no new concerns.  There are no specific complaints   ROS Denies recent fever or chills. Denies sinus pressure, nasal congestion, ear pain or sore throat. Denies chest congestion, productive cough or wheezing. Denies chest pains, palpitations and leg swelling Denies abdominal pain, nausea, vomiting,diarrhea or constipation.   Denies dysuria, frequency, hesitancy or incontinence. Denies joint pain, swelling and limitation in mobility. Denies headaches, seizures, numbness, or tingling. Denies depression, anxiety or insomnia. Denies skin break down or rash.   PE  BP 115/76   Pulse 79   Resp 16   Ht 5' (1.524 m)   Wt 183 lb 0.6 oz (83 kg)   LMP 06/10/1988 (Approximate)   SpO2 95%   BMI 35.75 kg/m   Patient alert and oriented and in no cardiopulmonary distress.  HEENT: No facial asymmetry, EOMI,     Neck supple .  Chest: Clear to auscultation bilaterally.  CVS: S1, S2 no murmurs, no S3.Regular rate.  ABD: Soft non tender.   Ext: No edema  MS: Adequate ROM spine, shoulders, hips and knees.  Skin: Intact, no ulcerations or rash noted.  Psych: Good eye contact, normal affect. Memory intact not anxious or depressed appearing.  CNS: CN 2-12 intact, power,  normal throughout.no focal deficits noted.   Assessment & Plan  Type 2 diabetes mellitus with vascular disease (HCC) Diabetes associated with  hypertension, hyperlipidemia, and obesity  Kelli Spencer is reminded of the importance of commitment to daily physical activity for 30 minutes or more, as able and the need to limit carbohydrate intake to 30 to 60 grams per meal to help with blood sugar control.  Not at goal incrase metformin to twice daily  The need to take medication as prescribed, test blood sugar as directed, and to call between visits if there is a concern that blood sugar is uncontrolled is also discussed.   Kelli Spencer is reminded of the importance of daily foot exam, annual eye examination, and good blood sugar, blood pressure and cholesterol control.     Latest Ref Rng & Units 12/18/2023   10:11 AM 12/18/2023   10:10 AM 08/14/2023    9:56 AM 05/11/2023    9:16 AM 01/05/2023    9:19 AM  Diabetic Labs  HbA1c 4.8 - 5.6 %  7.0  6.8  7.1  7.2   Microalbumin Not Estab. ug/mL    3.1    Micro/Creat Ratio 0 - 29 mg/g creat    3    Chol 0 - 200 mg/dL   213   086   HDL >57 mg/dL   42   42   Calc LDL 0 - 99 mg/dL   53   66   Triglycerides <150 mg/dL   58   52   Creatinine 0.44 - 1.00 mg/dL 8.46   9.62  9.52  8.41  02/05/2024    8:36 AM 12/18/2023    8:57 AM 10/26/2023   10:53 AM 10/26/2023   10:20 AM 10/26/2023    8:37 AM 08/21/2023    8:25 AM 05/15/2023    8:02 AM  BP/Weight  Systolic BP 115 138 138 142 -- 123 108  Diastolic BP 76 72 82 89 -- 86 74  Wt. (Lbs) 183.04 183.2  178.6 180 180 181  BMI 35.75 kg/m2 35.78 kg/m2  34.88 kg/m2 35.15 kg/m2 35.15 kg/m2 35.35 kg/m2      Latest Ref Rng & Units 08/21/2023    8:20 AM 04/13/2023   12:00 AM  Foot/eye exam completion dates  Eye Exam No Retinopathy  No Retinopathy      Foot Form Completion  Done      This result is from an external source.        Dyslipidemia, goal LDL below 100 Hyperlipidemia:Low fat diet discussed and encouraged.   Lipid Panel  Lab Results  Component Value Date   CHOL 107 08/14/2023   HDL 42 08/14/2023   LDLCALC 53 08/14/2023   TRIG  58 08/14/2023   CHOLHDL 2.5 08/14/2023     Updated lab needed at/ before next visit.   Breast cancer screening by mammogram Appt date to be set at checkout  Morbid obesity [facility])  Patient re-educated about  the importance of commitment to a  minimum of 150 minutes of exercise per week as able.  The importance of healthy food choices with portion control discussed, as well as eating regularly and within a 12 hour window most days. The need to choose "clean , green" food 50 to 75% of the time is discussed, as well as to make water the primary drink and set a goal of 64 ounces water daily.       02/05/2024    8:36 AM 12/18/2023    8:57 AM 10/26/2023   10:20 AM  Weight /BMI  Weight 183 lb 0.6 oz 183 lb 3.2 oz 178 lb 9.6 oz  Height 5' (1.524 m) 5' (1.524 m) 5' (1.524 m)  BMI 35.75 kg/m2 35.78 kg/m2 34.88 kg/m2    Unchanged  Vitamin D deficiency Updated lab needed at/ before next visit.   Essential hypertension DASH diet and commitment to daily physical activity for a minimum of 30 minutes discussed and encouraged, as a part of hypertension management. The importance of attaining a healthy weight is also discussed.     02/05/2024    8:36 AM 12/18/2023    8:57 AM 10/26/2023   10:53 AM 10/26/2023   10:20 AM 10/26/2023    8:37 AM 08/21/2023    8:25 AM 05/15/2023    8:02 AM  BP/Weight  Systolic BP 115 138 138 142 -- 123 108  Diastolic BP 76 72 82 89 -- 86 74  Wt. (Lbs) 183.04 183.2  178.6 180 180 181  BMI 35.75 kg/m2 35.78 kg/m2  34.88 kg/m2 35.15 kg/m2 35.15 kg/m2 35.35 kg/m2     Controlled, no change in medication   Constipation Controlled, no change in medication   GERD (gastroesophageal reflux disease) Controlled, no change in medication

## 2024-02-06 NOTE — Assessment & Plan Note (Signed)
  Patient re-educated about  the importance of commitment to a  minimum of 150 minutes of exercise per week as able.  The importance of healthy food choices with portion control discussed, as well as eating regularly and within a 12 hour window most days. The need to choose "clean , green" food 50 to 75% of the time is discussed, as well as to make water the primary drink and set a goal of 64 ounces water daily.       02/05/2024    8:36 AM 12/18/2023    8:57 AM 10/26/2023   10:20 AM  Weight /BMI  Weight 183 lb 0.6 oz 183 lb 3.2 oz 178 lb 9.6 oz  Height 5' (1.524 m) 5' (1.524 m) 5' (1.524 m)  BMI 35.75 kg/m2 35.78 kg/m2 34.88 kg/m2    Unchanged

## 2024-02-06 NOTE — Assessment & Plan Note (Signed)
 Updated lab needed at/ before next visit.

## 2024-02-06 NOTE — Assessment & Plan Note (Signed)
 Appt date to be set at checkout

## 2024-03-10 ENCOUNTER — Other Ambulatory Visit: Payer: Self-pay | Admitting: Family Medicine

## 2024-03-10 DIAGNOSIS — K219 Gastro-esophageal reflux disease without esophagitis: Secondary | ICD-10-CM

## 2024-03-16 LAB — MICROALBUMIN / CREATININE URINE RATIO: Microalb Creat Ratio: <30

## 2024-03-24 ENCOUNTER — Other Ambulatory Visit (INDEPENDENT_AMBULATORY_CARE_PROVIDER_SITE_OTHER)

## 2024-03-24 ENCOUNTER — Ambulatory Visit (INDEPENDENT_AMBULATORY_CARE_PROVIDER_SITE_OTHER): Admitting: Orthopedic Surgery

## 2024-03-24 DIAGNOSIS — M1711 Unilateral primary osteoarthritis, right knee: Secondary | ICD-10-CM

## 2024-03-24 DIAGNOSIS — Z96651 Presence of right artificial knee joint: Secondary | ICD-10-CM | POA: Diagnosis not present

## 2024-03-24 NOTE — Progress Notes (Signed)
   LMP 06/10/1988 (Approximate)   There is no height or weight on file to calculate BMI.  Chief Complaint  Patient presents with   Post-op Follow-up    Right knee     Encounter Diagnoses  Name Primary?   S/P total knee replacement, right 03/18/17 Yes   Unilateral primary osteoarthritis, right knee     DOI/DOS/ Date:  03/18/17  Improved

## 2024-03-24 NOTE — Progress Notes (Signed)
 FOLLOW-UP OFFICE VISIT   Patient: Kelli Spencer           Date of Birth: 1966/07/09           MRN: [id number] Visit Date: 03/24/2024 Requested by: Towanda Fret, MD [id number] [place],  [place] [address] PCP: Towanda Fret, MD    Encounter Diagnoses  Name Primary?   S/P total knee replacement, right 03/18/17 Yes   Unilateral primary osteoarthritis, right knee     Chief Complaint  Patient presents with   Post-op Follow-up    Right knee     Kelli Spencer is 58 years old this is your #7 after her right total knee arthroplasty, she had a fixed-bearing posterior stabilized Sigma implant  Last visit 2023 she was in excellent condition and the knee was functioning well  Today she has no complaints  Full extension on the right knee flexion 110 Feels very stable Gait ambulates no support no limp    Imaging shows stable right total knee arthroplasty with no signs of loosening with excellent alignment hip to ankle    ASSESSMENT AND PLAN 58 year old female 7 years out from her right total knee doing well  Her left knee bothers her but she does not want anything done at this time  X-ray again in 3 years

## 2024-03-25 ENCOUNTER — Ambulatory Visit: Payer: Medicare Other | Admitting: Orthopedic Surgery

## 2024-03-31 ENCOUNTER — Other Ambulatory Visit: Payer: Self-pay

## 2024-04-01 ENCOUNTER — Other Ambulatory Visit: Payer: Self-pay | Admitting: Family Medicine

## 2024-04-01 DIAGNOSIS — E785 Hyperlipidemia, unspecified: Secondary | ICD-10-CM

## 2024-04-06 DIAGNOSIS — E119 Type 2 diabetes mellitus without complications: Secondary | ICD-10-CM | POA: Diagnosis not present

## 2024-04-06 LAB — HM DIABETES EYE EXAM

## 2024-04-24 ENCOUNTER — Other Ambulatory Visit: Payer: Self-pay | Admitting: Family Medicine

## 2024-04-25 ENCOUNTER — Other Ambulatory Visit: Payer: Self-pay | Admitting: Family Medicine

## 2024-05-05 ENCOUNTER — Telehealth: Payer: Self-pay | Admitting: Family Medicine

## 2024-05-05 NOTE — Telephone Encounter (Signed)
 Patient goes to [facility] in [place] just went last month.

## 2024-05-05 NOTE — Telephone Encounter (Signed)
Faxed requesting records.

## 2024-05-11 ENCOUNTER — Other Ambulatory Visit (HOSPITAL_COMMUNITY)
Admission: RE | Admit: 2024-05-11 | Discharge: 2024-05-11 | Disposition: A | Discharge status: Home / Self Care | Source: Ambulatory Visit | Attending: Family Medicine | Admitting: Family Medicine

## 2024-05-11 DIAGNOSIS — E1159 Type 2 diabetes mellitus with other circulatory complications: Secondary | ICD-10-CM | POA: Insufficient documentation

## 2024-05-11 LAB — COMPREHENSIVE METABOLIC PANEL WITH GFR
ALT: 14 U/L (ref 0–44)
AST: 15 U/L (ref 15–41)
Albumin: 3.9 g/dL (ref 3.5–5.0)
Alkaline Phosphatase: 106 U/L (ref 38–126)
Anion gap: 11 (ref 5–15)
BUN: 19 mg/dL (ref 6–20)
CO2: 27 mmol/L (ref 22–32)
Calcium: 9.8 mg/dL (ref 8.9–10.3)
Chloride: 99 mmol/L (ref 98–111)
Creatinine, Ser: 1.28 mg/dL — ABNORMAL HIGH (ref 0.44–1.00)
GFR, Estimated: 48 mL/min — ABNORMAL LOW (ref >60)
Glucose, Bld: 127 mg/dL — ABNORMAL HIGH (ref 70–99)
Potassium: 4.6 mmol/L (ref 3.5–5.1)
Sodium: 137 mmol/L (ref 135–145)
Total Bilirubin: 0.5 mg/dL (ref 0.0–1.2)
Total Protein: 8 g/dL (ref 6.5–8.1)

## 2024-05-11 LAB — LIPID PANEL
Cholesterol: 127 mg/dL (ref 0–200)
HDL: 43 mg/dL (ref >40)
LDL Cholesterol: 72 mg/dL (ref 0–99)
Total CHOL/HDL Ratio: 3 ratio
Triglycerides: 60 mg/dL (ref <150)
VLDL: 12 mg/dL (ref 0–40)

## 2024-05-12 LAB — HEMOGLOBIN A1C
Hgb A1c MFr Bld: 7.2 % — ABNORMAL HIGH (ref 4.8–5.6)
Mean Plasma Glucose: 160 mg/dL

## 2024-05-13 ENCOUNTER — Ambulatory Visit: Payer: Self-pay | Admitting: Family Medicine

## 2024-05-20 ENCOUNTER — Encounter: Payer: Self-pay | Admitting: Family Medicine

## 2024-05-20 ENCOUNTER — Ambulatory Visit: Payer: Self-pay | Admitting: Family Medicine

## 2024-05-20 VITALS — BP 130/83 | HR 88 | Resp 16 | Ht 60.0 in | Wt 183.1 lb

## 2024-05-20 DIAGNOSIS — I10 Essential (primary) hypertension: Secondary | ICD-10-CM

## 2024-05-20 DIAGNOSIS — E1159 Type 2 diabetes mellitus with other circulatory complications: Secondary | ICD-10-CM | POA: Diagnosis not present

## 2024-05-20 DIAGNOSIS — E785 Hyperlipidemia, unspecified: Secondary | ICD-10-CM

## 2024-05-20 DIAGNOSIS — K219 Gastro-esophageal reflux disease without esophagitis: Secondary | ICD-10-CM | POA: Diagnosis not present

## 2024-05-20 MED ORDER — METFORMIN HCL ER 500 MG PO TB24
ORAL_TABLET | ORAL | 1 refills | Status: DC
Start: 2024-05-20 — End: 2024-07-18

## 2024-05-20 MED ORDER — SEMAGLUTIDE (2 MG/DOSE) 8 MG/3ML ~~LOC~~ SOPN
2.0000 mg | PEN_INJECTOR | SUBCUTANEOUS | 5 refills | Status: DC
Start: 2024-05-20 — End: 2024-09-23

## 2024-05-20 NOTE — Patient Instructions (Addendum)
 F/U in mid November  Stop Rybelsus . For diabetes in place of Rybelsus  is Ozempic 2 mg weekly.  Continue metformin  1000 mg 1 daily. ( Was taking 500 mg two daily which is the same dose)  Reduce glipizide  10 mg to 1 daily.  Check blood sugar either before breakfast or 2 hours after lunch.  If your blood sugars are high with the lower dose of glipizide  call in so that you will resume the twice daily glipizide  dose.  I do not think you will need this with the Ozempic.Call in next 4 weeks if uncontrolled blood sugar  Goal for fasting blood sugar ranges from 80 to 120 and 2 hours after any meal or at bedtime should be between 130 to 170.   HBA1C, non fasting bmp and EGFR 3 to 5  days before November visit  It is important that you exercise regularly at least 30 minutes 5 times a week. If you develop chest pain, have severe difficulty breathing, or feel very tired, stop exercising immediately and seek medical attention   Thanks for choosing [facility], we consider it a privelige to serve you.

## 2024-05-22 ENCOUNTER — Encounter: Payer: Self-pay | Admitting: Family Medicine

## 2024-05-22 NOTE — Assessment & Plan Note (Signed)
  Patient re-educated about  the importance of commitment to a  minimum of 150 minutes of exercise per week as able.  The importance of healthy food choices with portion control discussed, as well as eating regularly and within a 12 hour window most days. The need to choose clean , green food 50 to 75% of the time is discussed, as well as to make water  the primary drink and set a goal of 64 ounces water  daily.       05/20/2024    8:23 AM 02/05/2024    8:36 AM 12/18/2023    8:57 AM  Weight /BMI  Weight 183 lb 1.9 oz 183 lb 0.6 oz 183 lb 3.2 oz  Height 5' (1.524 m) 5' (1.524 m) 5' (1.524 m)  BMI 35.76 kg/m2 35.75 kg/m2 35.78 kg/m2    Unchanged inc activity and med change

## 2024-05-22 NOTE — Assessment & Plan Note (Signed)
 Controlled, no change in medication

## 2024-05-22 NOTE — Progress Notes (Signed)
 Kelli Spencer     MRN: [id number]      DOB: 1966-07-27  Chief Complaint  Patient presents with   Diabetes    3 month follow up     HPI Kelli Spencer is here for follow up and re-evaluation of chronic medical conditions, medication management and review of any available recent lab and radiology data.  Preventive health is updated, specifically  Cancer screening and Immunization.   Questions or concerns regarding consultations or procedures which the PT has had in the interim are  addressed. The PT denies any adverse reactions to current medications since the last visit.  Ready to change to ozempic  due to unchanged blood sugar and weight unchanged fBG daily and are excellent, she will also increase exercise commitment ROS Denies recent fever or chills. Denies sinus pressure, nasal congestion, ear pain or sore throat. Denies chest congestion, productive cough or wheezing. Denies chest pains, palpitations and leg swelling Denies abdominal pain, nausea, vomiting,diarrhea or constipation.   Denies dysuria, frequency, hesitancy or incontinence. Chronic knee pain, minimal  limitation in mobility. Denies headaches, seizures, numbness, or tingling. Denies depression, anxiety or insomnia. Denies skin break down or rash.   PE  BP 130/83   Pulse 88   Resp 16   Ht 5' (1.524 m)   Wt 183 lb 1.9 oz (83.1 kg)   LMP 06/10/1988 (Approximate)   SpO2 96%   BMI 35.76 kg/m   Patient alert and oriented and in no cardiopulmonary distress.  HEENT: No facial asymmetry, EOMI,     Neck supple .  Chest: Clear to auscultation bilaterally.  CVS: S1, S2 no murmurs, no S3.Regular rate.  ABD: Soft non tender.   Ext: No edema  MS: Adequate ROM spine, shoulders, hips and  reduced in knees.  Skin: Intact, no ulcerations or rash noted.  Psych: Good eye contact, normal affect. Memory intact not anxious or depressed appearing.  CNS: CN 2-12 intact, power,  normal throughout.no focal deficits  noted.   Assessment & Plan  Essential hypertension Controlled, no change in medication DASH diet and commitment to daily physical activity for a minimum of 30 minutes discussed and encouraged, as a part of hypertension management. The importance of attaining a healthy weight is also discussed.     05/20/2024    8:23 AM 02/05/2024    8:36 AM 12/18/2023    8:57 AM 10/26/2023   10:53 AM 10/26/2023   10:20 AM 10/26/2023    8:37 AM 08/21/2023    8:25 AM  BP/Weight  Systolic BP 130 115 138 138 142 -- 123  Diastolic BP 83 76 72 82 89 -- 86  Wt. (Lbs) 183.12 183.04 183.2  178.6 180 180  BMI 35.76 kg/m2 35.75 kg/m2 35.78 kg/m2  34.88 kg/m2 35.15 kg/m2 35.15 kg/m2       Type 2 diabetes mellitus with vascular disease (HCC) Diabetes associated with hypertension, hyperlipidemia, obesity, and arthritis  Kelli Spencer is reminded of the importance of commitment to daily physical activity for 30 minutes or more, as able and the need to limit carbohydrate intake to 30 to 60 grams per meal to help with blood sugar control.   The need to take medication as prescribed, test blood sugar as directed, and to call between visits if there is a concern that blood sugar is uncontrolled is also discussed.   Kelli Spencer is reminded of the importance of daily foot exam, annual eye examination, and good blood sugar, blood pressure and cholesterol control.  Latest Ref Rng & Units 05/11/2024    9:31 AM 05/11/2024    9:30 AM 03/16/2024   12:00 AM 12/18/2023   10:11 AM 12/18/2023   10:10 AM  Diabetic Labs  HbA1c 4.8 - 5.6 %  7.2    7.0   Micro/Creat Ratio    <30        Chol 0 - 200 mg/dL  872      HDL >59 mg/dL  43      Calc LDL 0 - 99 mg/dL  72      Triglycerides <150 mg/dL  60      Creatinine 9.55 - 1.00 mg/dL 8.71    8.93       This result is from an external source.      05/20/2024    8:23 AM 02/05/2024    8:36 AM 12/18/2023    8:57 AM 10/26/2023   10:53 AM 10/26/2023   10:20 AM 10/26/2023    8:37 AM  08/21/2023    8:25 AM  BP/Weight  Systolic BP 130 115 138 138 142 -- 123  Diastolic BP 83 76 72 82 89 -- 86  Wt. (Lbs) 183.12 183.04 183.2  178.6 180 180  BMI 35.76 kg/m2 35.75 kg/m2 35.78 kg/m2  34.88 kg/m2 35.15 kg/m2 35.15 kg/m2      Latest Ref Rng & Units 04/06/2024   12:00 AM 08/21/2023    8:20 AM  Foot/eye exam completion dates  Eye Exam No Retinopathy No Retinopathy       Foot Form Completion   Done     This result is from an external source.  Change to ozempic  from rybelsus  Reduce glipizide  to one daily     Morbid obesity [facility])  Patient re-educated about  the importance of commitment to a  minimum of 150 minutes of exercise per week as able.  The importance of healthy food choices with portion control discussed, as well as eating regularly and within a 12 hour window most days. The need to choose clean , green food 50 to 75% of the time is discussed, as well as to make water  the primary drink and set a goal of 64 ounces water  daily.       05/20/2024    8:23 AM 02/05/2024    8:36 AM 12/18/2023    8:57 AM  Weight /BMI  Weight 183 lb 1.9 oz 183 lb 0.6 oz 183 lb 3.2 oz  Height 5' (1.524 m) 5' (1.524 m) 5' (1.524 m)  BMI 35.76 kg/m2 35.75 kg/m2 35.78 kg/m2    Unchanged inc activity and med change  GERD (gastroesophageal reflux disease) Controlled, no change in medication   Dyslipidemia, goal LDL below 100 Hyperlipidemia:Low fat diet discussed and encouraged.   Lipid Panel  Lab Results  Component Value Date   CHOL 127 05/11/2024   HDL 43 05/11/2024   LDLCALC 72 05/11/2024   TRIG 60 05/11/2024   CHOLHDL 3.0 05/11/2024     Controlled, no change in medication

## 2024-05-22 NOTE — Assessment & Plan Note (Signed)
 Controlled, no change in medication DASH diet and commitment to daily physical activity for a minimum of 30 minutes discussed and encouraged, as a part of hypertension management. The importance of attaining a healthy weight is also discussed.     05/20/2024    8:23 AM 02/05/2024    8:36 AM 12/18/2023    8:57 AM 10/26/2023   10:53 AM 10/26/2023   10:20 AM 10/26/2023    8:37 AM 08/21/2023    8:25 AM  BP/Weight  Systolic BP 130 115 138 138 142 -- 123  Diastolic BP 83 76 72 82 89 -- 86  Wt. (Lbs) 183.12 183.04 183.2  178.6 180 180  BMI 35.76 kg/m2 35.75 kg/m2 35.78 kg/m2  34.88 kg/m2 35.15 kg/m2 35.15 kg/m2

## 2024-05-22 NOTE — Assessment & Plan Note (Signed)
 Hyperlipidemia:Low fat diet discussed and encouraged.   Lipid Panel  Lab Results  Component Value Date   CHOL 127 05/11/2024   HDL 43 05/11/2024   LDLCALC 72 05/11/2024   TRIG 60 05/11/2024   CHOLHDL 3.0 05/11/2024     Controlled, no change in medication

## 2024-05-22 NOTE — Assessment & Plan Note (Signed)
 Diabetes associated with hypertension, hyperlipidemia, obesity, and arthritis  Kelli Spencer is reminded of the importance of commitment to daily physical activity for 30 minutes or more, as able and the need to limit carbohydrate intake to 30 to 60 grams per meal to help with blood sugar control.   The need to take medication as prescribed, test blood sugar as directed, and to call between visits if there is a concern that blood sugar is uncontrolled is also discussed.   Kelli Spencer is reminded of the importance of daily foot exam, annual eye examination, and good blood sugar, blood pressure and cholesterol control.     Latest Ref Rng & Units 05/11/2024    9:31 AM 05/11/2024    9:30 AM 03/16/2024   12:00 AM 12/18/2023   10:11 AM 12/18/2023   10:10 AM  Diabetic Labs  HbA1c 4.8 - 5.6 %  7.2    7.0   Micro/Creat Ratio    <30        Chol 0 - 200 mg/dL  872      HDL >59 mg/dL  43      Calc LDL 0 - 99 mg/dL  72      Triglycerides <150 mg/dL  60      Creatinine 9.55 - 1.00 mg/dL 8.71    8.93       This result is from an external source.      05/20/2024    8:23 AM 02/05/2024    8:36 AM 12/18/2023    8:57 AM 10/26/2023   10:53 AM 10/26/2023   10:20 AM 10/26/2023    8:37 AM 08/21/2023    8:25 AM  BP/Weight  Systolic BP 130 115 138 138 142 -- 123  Diastolic BP 83 76 72 82 89 -- 86  Wt. (Lbs) 183.12 183.04 183.2  178.6 180 180  BMI 35.76 kg/m2 35.75 kg/m2 35.78 kg/m2  34.88 kg/m2 35.15 kg/m2 35.15 kg/m2      Latest Ref Rng & Units 04/06/2024   12:00 AM 08/21/2023    8:20 AM  Foot/eye exam completion dates  Eye Exam No Retinopathy No Retinopathy       Foot Form Completion   Done     This result is from an external source.  Change to ozempic  from rybelsus  Reduce glipizide  to one daily

## 2024-07-10 ENCOUNTER — Other Ambulatory Visit: Payer: Self-pay | Admitting: Family Medicine

## 2024-07-14 ENCOUNTER — Other Ambulatory Visit: Payer: Self-pay

## 2024-07-14 DIAGNOSIS — K760 Fatty (change of) liver, not elsewhere classified: Secondary | ICD-10-CM

## 2024-07-17 ENCOUNTER — Other Ambulatory Visit: Payer: Self-pay | Admitting: Family Medicine

## 2024-08-26 ENCOUNTER — Ambulatory Visit (HOSPITAL_COMMUNITY)
Admission: RE | Admit: 2024-08-26 | Discharge: 2024-08-26 | Disposition: A | Discharge status: Home / Self Care | Source: Ambulatory Visit | Attending: Family Medicine | Admitting: Family Medicine

## 2024-08-26 DIAGNOSIS — Z1231 Encounter for screening mammogram for malignant neoplasm of breast: Secondary | ICD-10-CM | POA: Insufficient documentation

## 2024-08-31 ENCOUNTER — Ambulatory Visit: Payer: Self-pay

## 2024-09-16 ENCOUNTER — Other Ambulatory Visit (HOSPITAL_COMMUNITY)
Admission: RE | Admit: 2024-09-16 | Discharge: 2024-09-16 | Disposition: A | Discharge status: Home / Self Care | Source: Ambulatory Visit | Attending: Family Medicine | Admitting: Family Medicine

## 2024-09-16 ENCOUNTER — Ambulatory Visit: Payer: Self-pay | Admitting: Family Medicine

## 2024-09-16 DIAGNOSIS — E1159 Type 2 diabetes mellitus with other circulatory complications: Secondary | ICD-10-CM | POA: Insufficient documentation

## 2024-09-16 LAB — BASIC METABOLIC PANEL WITH GFR
Anion gap: 12 (ref 5–15)
BUN: 15 mg/dL (ref 6–20)
CO2: 28 mmol/L (ref 22–32)
Calcium: 9.5 mg/dL (ref 8.9–10.3)
Chloride: 100 mmol/L (ref 98–111)
Creatinine, Ser: 1.05 mg/dL — ABNORMAL HIGH (ref 0.44–1.00)
GFR, Estimated: >60 mL/min (ref >60)
Glucose, Bld: 76 mg/dL (ref 70–99)
Potassium: 4.3 mmol/L (ref 3.5–5.1)
Sodium: 139 mmol/L (ref 135–145)

## 2024-09-16 LAB — HEMOGLOBIN A1C
Hgb A1c MFr Bld: 6 % — ABNORMAL HIGH (ref 4.8–5.6)
Mean Plasma Glucose: 125.5 mg/dL

## 2024-09-23 ENCOUNTER — Encounter: Payer: Self-pay | Admitting: Family Medicine

## 2024-09-23 ENCOUNTER — Ambulatory Visit: Admitting: Family Medicine

## 2024-09-23 VITALS — BP 116/77 | HR 76 | Resp 16 | Ht 60.0 in | Wt 160.1 lb

## 2024-09-23 DIAGNOSIS — E785 Hyperlipidemia, unspecified: Secondary | ICD-10-CM

## 2024-09-23 DIAGNOSIS — L729 Follicular cyst of the skin and subcutaneous tissue, unspecified: Secondary | ICD-10-CM

## 2024-09-23 DIAGNOSIS — Z2821 Immunization not carried out because of patient refusal: Secondary | ICD-10-CM

## 2024-09-23 DIAGNOSIS — E1159 Type 2 diabetes mellitus with other circulatory complications: Secondary | ICD-10-CM

## 2024-09-23 DIAGNOSIS — E790 Hyperuricemia without signs of inflammatory arthritis and tophaceous disease: Secondary | ICD-10-CM

## 2024-09-23 DIAGNOSIS — Z0001 Encounter for general adult medical examination with abnormal findings: Secondary | ICD-10-CM | POA: Diagnosis not present

## 2024-09-23 DIAGNOSIS — I10 Essential (primary) hypertension: Secondary | ICD-10-CM

## 2024-09-23 DIAGNOSIS — E559 Vitamin D deficiency, unspecified: Secondary | ICD-10-CM

## 2024-09-23 MED ORDER — SEMAGLUTIDE (2 MG/DOSE) 8 MG/3ML ~~LOC~~ SOPN: 2.0000 mg | PEN_INJECTOR | SUBCUTANEOUS | 5 refills | Status: DC

## 2024-09-23 MED ORDER — METFORMIN HCL ER 500 MG PO TB24: ORAL_TABLET | ORAL | 2 refills | Status: DC

## 2024-09-23 MED ORDER — HYDROCHLOROTHIAZIDE 12.5 MG PO CAPS: 12.5000 mg | ORAL_CAPSULE | Freq: Every day | ORAL | 2 refills | Status: DC

## 2024-09-23 NOTE — Patient Instructions (Addendum)
 F/U in last week in May / early June  Annual exam with Leita in 1 yearFasting lipid, cmp and EGfR, HBA1C, CBC, TSH and vit D and urine ACR  and uric acid level,May 20 or after  You are referred to surgery re sebaceous cyst  CONGRATS onn exxcellent labs and lifestyle changes , keep it up!  Please reconsider vaccines  Thanks for choosing [facility], we consider it a privelige to serve you.

## 2024-09-23 NOTE — Assessment & Plan Note (Signed)
 Updated lab needed at/ before next visit.

## 2024-09-23 NOTE — Assessment & Plan Note (Signed)
 Revuiewed vaccines needed and still refuses

## 2024-09-23 NOTE — Assessment & Plan Note (Signed)
 Diabetes associated with hypertension and hyperlipidemia  Kelli Spencer is reminded of the importance of commitment to daily physical activity for 30 minutes or more, as able and the need to limit carbohydrate intake to 30 to 60 grams per meal to help with blood sugar control.   The need to take medication as prescribed, test blood sugar as directed, and to call between visits if there is a concern that blood sugar is uncontrolled is also discussed.   Kelli Spencer is reminded of the importance of daily foot exam, annual eye examination, and good blood sugar, blood pressure and cholesterol control.     Latest Ref Rng & Units 09/16/2024    9:13 AM 05/11/2024    9:31 AM 05/11/2024    9:30 AM 03/16/2024   12:00 AM 12/18/2023   10:11 AM  Diabetic Labs  HbA1c 4.8 - 5.6 % 6.0   7.2     Micro/Creat Ratio     <30       Chol 0 - 200 mg/dL   872     HDL >59 mg/dL   43     Calc LDL 0 - 99 mg/dL   72     Triglycerides <150 mg/dL   60     Creatinine 9.55 - 1.00 mg/dL 8.94  8.71    8.93      This result is from an external source.      09/23/2024    8:20 AM 05/20/2024    8:23 AM 02/05/2024    8:36 AM 12/18/2023    8:57 AM 10/26/2023   10:53 AM 10/26/2023   10:20 AM 10/26/2023    8:37 AM  BP/Weight  Systolic BP 116 130 115 138 138 142 --  Diastolic BP 77 83 76 72 82 89 --  Wt. (Lbs) 160.12 183.12 183.04 183.2  178.6 180  BMI 31.27 kg/m2 35.76 kg/m2 35.75 kg/m2 35.78 kg/m2  34.88 kg/m2 35.15 kg/m2      Latest Ref Rng & Units 09/23/2024    8:20 AM 04/06/2024   12:00 AM  Foot/eye exam completion dates  Eye Exam No Retinopathy  No Retinopathy      Foot Form Completion  Done      This result is from an external source.

## 2024-09-23 NOTE — Assessment & Plan Note (Signed)

## 2024-09-23 NOTE — Assessment & Plan Note (Signed)
 Left buttock approx 4 cm wide and deep sebacwous cyst , refer surgery

## 2024-09-23 NOTE — Progress Notes (Signed)
 [facility]     MRN: [id number]      DOB: 1966-07-04  Chief Complaint  Patient presents with   Annual Exam    HPI: Patient is in for annual physical exam. C/o enlarging cyst on buttock at times painful, present for over 1 year Recent labs,  are reviewed.and are excellent Immunization is reviewed , and  continues to refuse   PE: BP 116/77   Pulse 76   Resp 16   Ht 5' (1.524 m)   Wt 160 lb 1.9 oz (72.6 kg)   LMP 06/10/1988 (Approximate)   SpO2 96%   BMI 31.27 kg/m   Pleasant  female, alert and oriented x 3, in no cardio-pulmonary distress. Afebrile. HEENT No facial trauma or asymetry. Sinuses non tender.  Extra occullar muscles intact.. External ears normal, . Neck: supple, no adenopathy,JVD or thyromegaly.No bruits.  Chest: Clear to ascultation bilaterally.No crackles or wheezes. Non tender to palpation   Cardiovascular system; Heart sounds normal,  S1 and  S2 ,no S3.  No murmur, or thrill. Apical beat not displaced Peripheral pulses normal.  Abdomen: Soft, non tender, no organomegaly or masses. No bruits. Bowel sounds normal. No guarding, tenderness or rebound.     Musculoskeletal exam: Full ROM of spine, hips , shoulders and knees. No deformity ,swelling or crepitus noted. No muscle wasting or atrophy.   Neurologic: Cranial nerves 2 to 12 intact. Power, tone ,sensation and reflexes normal throughout. No disturbance in gait. No tremor.  Skin: Intact, no ulceration, erythema , scaling or rash noted. Pigmentation normal throughout Sebaceous cyst on left buttock approx 4 cm max diameter and also deep, 4 cm, unable to extrude material with gentle pressure  Psych; Normal mood and affect. Judgement and concentration normal   Assessment & Plan:  Immunization refused Revuiewed vaccines needed and still refuses  Encounter for Medicare annual examination with abnormal findings Annual exam as documented. Counseling done  re healthy lifestyle  involving commitment to 150 minutes exercise per week, heart healthy diet, and attaining healthy weight.The importance of adequate sleep also discussed. Regular seat belt use and home safety, is also discussed. Changes in health habits are decided on by the patient with goals and time frames  set for achieving them. Immunization and cancer screening needs are specifically addressed at this visit.   Type 2 diabetes mellitus with vascular disease (HCC) Diabetes associated with hypertension and hyperlipidemia  Ms. Eugenio is reminded of the importance of commitment to daily physical activity for 30 minutes or more, as able and the need to limit carbohydrate intake to 30 to 60 grams per meal to help with blood sugar control.   The need to take medication as prescribed, test blood sugar as directed, and to call between visits if there is a concern that blood sugar is uncontrolled is also discussed.   Ms. Forsman is reminded of the importance of daily foot exam, annual eye examination, and good blood sugar, blood pressure and cholesterol control.     Latest Ref Rng & Units 09/16/2024    9:13 AM 05/11/2024    9:31 AM 05/11/2024    9:30 AM 03/16/2024   12:00 AM 12/18/2023   10:11 AM  Diabetic Labs  HbA1c 4.8 - 5.6 % 6.0   7.2     Micro/Creat Ratio     <30       Chol 0 - 200 mg/dL   872     HDL >59 mg/dL   43  Calc LDL 0 - 99 mg/dL   72     Triglycerides <150 mg/dL   60     Creatinine 9.55 - 1.00 mg/dL 8.94  8.71    8.93      This result is from an external source.      09/23/2024    8:20 AM 05/20/2024    8:23 AM 02/05/2024    8:36 AM 12/18/2023    8:57 AM 10/26/2023   10:53 AM 10/26/2023   10:20 AM 10/26/2023    8:37 AM  BP/Weight  Systolic BP 116 130 115 138 138 142 --  Diastolic BP 77 83 76 72 82 89 --  Wt. (Lbs) 160.12 183.12 183.04 183.2  178.6 180  BMI 31.27 kg/m2 35.76 kg/m2 35.75 kg/m2 35.78 kg/m2  34.88 kg/m2 35.15 kg/m2      Latest Ref Rng & Units 09/23/2024    8:20 AM  04/06/2024   12:00 AM  Foot/eye exam completion dates  Eye Exam No Retinopathy  No Retinopathy      Foot Form Completion  Done      This result is from an external source.        Hyperuricemia Updated lab needed at/ before next visit.   Cyst of buttocks Left buttock approx 4 cm wide and deep sebacwous cyst , refer surgery

## 2024-10-03 ENCOUNTER — Encounter: Payer: Self-pay | Admitting: *Deleted

## 2024-10-19 ENCOUNTER — Other Ambulatory Visit: Payer: Self-pay | Admitting: Gastroenterology

## 2024-10-19 DIAGNOSIS — K219 Gastro-esophageal reflux disease without esophagitis: Secondary | ICD-10-CM

## 2024-10-31 ENCOUNTER — Ambulatory Visit: Payer: 59

## 2024-10-31 VITALS — Ht 60.0 in | Wt 160.0 lb

## 2024-10-31 DIAGNOSIS — E1159 Type 2 diabetes mellitus with other circulatory complications: Secondary | ICD-10-CM

## 2024-10-31 DIAGNOSIS — Z0000 Encounter for general adult medical examination without abnormal findings: Secondary | ICD-10-CM

## 2024-10-31 DIAGNOSIS — Z0001 Encounter for general adult medical examination with abnormal findings: Secondary | ICD-10-CM | POA: Diagnosis not present

## 2024-10-31 DIAGNOSIS — Z780 Asymptomatic menopausal state: Secondary | ICD-10-CM

## 2024-10-31 MED ORDER — LANCET DEVICE MISC: 1.0000 | Freq: Three times a day (TID) | 0 refills | Status: AC

## 2024-10-31 MED ORDER — BLOOD GLUCOSE MONITORING SUPPL DEVI: 1.0000 | Freq: Three times a day (TID) | 0 refills | Status: AC

## 2024-10-31 MED ORDER — BLOOD GLUCOSE TEST VI STRP: 1.0000 | ORAL_STRIP | Freq: Three times a day (TID) | 0 refills | Status: DC

## 2024-10-31 NOTE — Progress Notes (Signed)
 "  HM Addressed: DEXA ordered Prescription sent to local preferred pharmacy for new glucose meter and testing supplies. Patient aware and in agreement with treatment plan.  Chief Complaint  Patient presents with   Medicare Wellness     Subjective:   Kelli Spencer is a 58 y.o. female who presents for a Medicare Annual Wellness Visit.  Visit info / Clinical Intake: Medicare Wellness Visit Type:: Subsequent Annual Wellness Visit Persons participating in visit and providing information:: patient Medicare Wellness Visit Mode:: Telephone If telephone:: video error Since this visit was completed virtually, some vitals may be partially provided or unavailable. Missing vitals are due to the limitations of the virtual format.: Documented vitals are patient reported If Telephone or Video please confirm:: I connected with patient using audio/video enable telemedicine. I verified patient identity with two identifiers, discussed telehealth limitations, and patient agreed to proceed. Patient Location:: home Provider Location:: office Interpreter Needed?: No Pre-visit prep was completed: yes AWV questionnaire completed by patient prior to visit?: no Living arrangements:: with family/others Patient's Overall Health Status Rating: very good Typical amount of pain: none Does pain affect daily life?: no Are you currently prescribed opioids?: no  Dietary Habits and Nutritional Risks How many meals a day?: 2 Eats fruit and vegetables daily?: yes Most meals are obtained by: preparing own meals In the last 2 weeks, have you had any of the following?: none Diabetic:: (!) yes Any non-healing wounds?: no How often do you check your BS?: 0 (pt stated she no longer had test strips and the pharmacy said she needed to contact her doctor. prescription sent in for new meter with testing supplies) Would you like to be referred to a Nutritionist or for Diabetic Management? : no  Functional Status Activities  of Daily Living (to include ambulation/medication): Independent Ambulation: Independent Medication Administration: Independent Home Management (perform basic housework or laundry): Independent Manage your own finances?: yes Primary transportation is: driving Concerns about vision?: no *vision screening is required for WTM*  Fall Screening Falls in the past year?: 0 Number of falls in past year: 0 Was there an injury with Fall?: 0 Fall Risk Category Calculator: 0 Patient Fall Risk Level: Low Fall Risk  Fall Risk Patient at Risk for Falls Due to: No Fall Risks Fall risk Follow up: Falls evaluation completed; Education provided; Falls prevention discussed  Home and Transportation Safety: All rugs have non-skid backing?: yes All stairs or steps have railings?: yes Grab bars in the bathtub or shower?: yes Have non-skid surface in bathtub or shower?: yes Good home lighting?: yes Regular seat belt use?: yes Hospital stays in the last year:: no  Cognitive Assessment Difficulty concentrating, remembering, or making decisions? : no Will 6CIT or Mini Cog be Completed: yes What year is it?: 0 points What month is it?: 0 points Give patient an address phrase to remember (5 components): [address] [facility] [place] About what time is it?: 0 points Count backwards from 20 to 1: 0 points Say the months of the year in reverse: 0 points Repeat the address phrase from earlier: 0 points 6 CIT Score: 0 points  Advance Directives (For Healthcare) Does Patient Have a Medical Advance Directive?: No Would patient like information on creating a medical advance directive?: Yes (MAU/Ambulatory/Procedural Areas - Information given)  Reviewed/Updated  Reviewed/Updated: Reviewed All (Medical, Surgical, Family, Medications, Allergies, Care Teams, Patient Goals)    Allergies (verified) Prednisone , Amlodipine , and Ace inhibitors   Current Medications (verified) Outpatient Encounter Medications as of  10/31/2024  Medication Sig   ACCU-CHEK GUIDE TEST test strip USE TO CHECK BLOOD SUGAR TWICE DAILY   Accu-Chek Softclix Lancets lancets USE TO TEST blood sugar twice daily  Dx E11.65   blood glucose meter kit and supplies Dispense based on patient and insurance preference. Once daily testing dx e11.9   Blood Glucose Monitoring Suppl DEVI 1 each by Does not apply route in the morning, at noon, and at bedtime. May substitute to any manufacturer covered by patient's insurance.   cholecalciferol  (VITAMIN D ) 400 UNITS TABS tablet Take 800 Units by mouth daily.   Glucose Blood (BLOOD GLUCOSE TEST STRIPS) STRP 1 each by In Vitro route in the morning, at noon, and at bedtime. May substitute to any manufacturer covered by patient's insurance.   hydrochlorothiazide  (MICROZIDE ) 12.5 MG capsule Take 1 capsule (12.5 mg total) by mouth daily.   Lancet Device MISC 1 each by Does not apply route in the morning, at noon, and at bedtime. May substitute to any manufacturer covered by patient's insurance.   lubiprostone  (AMITIZA ) 24 MCG capsule TAKE 1 CAPSULE BY MOUTH TWICE  DAILY WITH MEALS   metFORMIN  (GLUCOPHAGE -XR) 500 MG 24 hr tablet TAKE 2 TABLETS BY MOUTH ONCE  DAILY WITH BREAKFAST   pravastatin  (PRAVACHOL ) 10 MG tablet TAKE 1 TABLET BY MOUTH DAILY   RABEprazole  (ACIPHEX ) 20 MG tablet TAKE 1 TABLET BY MOUTH TWICE  DAILY BEFORE MEALS   Semaglutide , 2 MG/DOSE, 8 MG/3ML SOPN Inject 2 mg as directed once a week.   No facility-administered encounter medications on file as of 10/31/2024.    History: Past Medical History:  Diagnosis Date   Arthritis    Asthma    Diabetes mellitus without complication (HCC)    Diastolic hypertension    Esophageal dysphagia 03/31/2018   GERD (gastroesophageal reflux disease)    Hyperlipidemia    Hypertension    Migraines    Obesity    Seizures (HCC)    pt states she has seizures only when she gets upset. pt is taking no seizure meds   Past Surgical History:  Procedure  Laterality Date   ABDOMINAL HYSTERECTOMY  1997   fibroids   BALLOON DILATION N/A 01/25/2021   Procedure: BALLOON DILATION;  Surgeon: Cindie Carlin POUR, DO;  Location: [facility];  Service: Endoscopy;  Laterality: N/A;   BIOPSY  01/25/2021   Procedure: BIOPSY;  Surgeon: Cindie Carlin POUR, DO;  Location: [facility];  Service: Endoscopy;;   COLONOSCOPY N/A 06/23/2016   Dr. Harvey: prep not adequate to detect polyps less than 6mm, redundant left colon. hemorrhoids. next TCS in 5 years.    COLONOSCOPY WITH PROPOFOL  N/A 06/13/2022   Procedure: COLONOSCOPY WITH PROPOFOL ;  Surgeon: Cindie Carlin POUR, DO;  Location: [facility];  Service: Endoscopy;  Laterality: N/A;  11:00am, asa 3   ESOPHAGOGASTRODUODENOSCOPY N/A 04/02/2018   Dr. harvey: Benign-appearing esophageal stricture status post dilation, multiple benign gastric polyps, mild gastritis no H. pylori.   ESOPHAGOGASTRODUODENOSCOPY (EGD) WITH PROPOFOL  N/A 01/25/2021   Benign-appearing esophageal stenosis status post dilation, gastritis with no H. pylori.  Procedure: ESOPHAGOGASTRODUODENOSCOPY (EGD) WITH PROPOFOL ;  Surgeon: Cindie Carlin POUR, DO;  Location: [facility];  Service: Endoscopy;  Laterality: N/A;  am appt   KNEE ARTHROSCOPY WITH MEDIAL MENISECTOMY Left 02/09/2014   Procedure: KNEE ARTHROSCOPY WITH MEDIAL MENISECTOMY;  Surgeon: Taft FORBES Minerva, MD;  Location: [facility];  Service: Orthopedics;  Laterality: Left;   PARTIAL HYSTERECTOMY     POLYPECTOMY  06/13/2022  Procedure: POLYPECTOMY;  Surgeon: Cindie Carlin POUR, DO;  Location: [facility];  Service: Endoscopy;;   SAVORY DILATION N/A 04/02/2018   Procedure: SAVORY DILATION;  Surgeon: Harvey Margo CROME, MD;  [facility]: [facility];  Service: Endoscopy;  Laterality: N/A;   TOTAL KNEE ARTHROPLASTY Right 03/18/2017   Procedure: TOTAL KNEE ARTHROPLASTY;  Surgeon: Margrette Taft BRAVO, MD;  Location: [facility];  Service: Orthopedics;  Laterality: Right;   Family History  Problem  Relation Age of Onset   Kidney failure Mother    Diabetes Mother    Hypertension Mother    Stroke Mother    Diabetes Father    Heart disease Father    Hypertension Sister    Colon cancer Neg Hx    Social History   Occupational History   Occupation: disabled  Tobacco Use   Smoking status: Never   Smokeless tobacco: Never  Vaping Use   Vaping status: Never Used  Substance and Sexual Activity   Alcohol use: No   Drug use: No   Sexual activity: Not Currently   Tobacco Counseling Counseling given: Yes  SDOH Screenings   Food Insecurity: No Food Insecurity (10/31/2024)  Housing: Low Risk (10/31/2024)  Transportation Needs: No Transportation Needs (10/31/2024)  Utilities: Not At Risk (10/31/2024)  Alcohol Screen: Low Risk (10/26/2023)  Depression (PHQ2-9): Low Risk (10/31/2024)  Financial Resource Strain: Low Risk (10/26/2023)  Physical Activity: Inactive (10/31/2024)  Social Connections: Moderately Isolated (10/31/2024)  Stress: No Stress Concern Present (10/31/2024)  Tobacco Use: Low Risk (10/31/2024)  Health Literacy: Adequate Health Literacy (10/31/2024)   See flowsheets for full screening details  Depression Screen PHQ 2 & 9 Depression Scale- Over the past 2 weeks, how often have you been bothered by any of the following problems? Little interest or pleasure in doing things: 0 Feeling down, depressed, or hopeless (PHQ Adolescent also includes...irritable): 0 PHQ-2 Total Score: 0 Trouble falling or staying asleep, or sleeping too much: 0 Feeling tired or having little energy: 0 Poor appetite or overeating (PHQ Adolescent also includes...weight loss): 0 Feeling bad about yourself - or that you are a failure or have let yourself or your family down: 0 Trouble concentrating on things, such as reading the newspaper or watching television (PHQ Adolescent also includes...like school work): 0 Moving or speaking so slowly that other people could have noticed. Or the  opposite - being so fidgety or restless that you have been moving around a lot more than usual: 0 Thoughts that you would be better off dead, or of hurting yourself in some way: 0 PHQ-9 Total Score: 0  Depression Treatment Depression Interventions/Treatment : PHQ2-9 Score <4 Follow-up Not Indicated     Goals Addressed             This Visit's Progress    Increase physical activity   On track    Try to walk 30 minutes after dinner 2 evenings a week              Objective:    Today's Vitals   10/31/24 0852  Weight: 160 lb (72.6 kg)  Height: 5' (1.524 m)   Body mass index is 31.25 kg/m.  Hearing/Vision screen Hearing Screening - Comments:: Patient denies any hearing difficulties.   Vision Screening - Comments:: Wears rx glasses - up to date with routine eye exams with  Oneil Kawasaki  Immunizations and Health Maintenance Health Maintenance  Topic Date Due   Bone Density Scan  Never done   Medicare Annual Wellness (  AWV)  10/25/2024   Influenza Vaccine  01/31/2025 (Originally 06/03/2024)   Pneumococcal Vaccine: 50+ Years (1 of 2 - PCV) 09/23/2025 (Originally 08[birth year]/1983)   COVID-19 Vaccine (1 - 2025-26 season) 11/02/2025 (Originally 07/04/2024)   Zoster Vaccines- Shingrix (1 of 2) 11/01/2026 (Originally 08/02/2014)   COLON CANCER SCREENING ANNUAL FOBT  07/01/2027 (Originally 06/14/2023)   Diabetic kidney evaluation - Urine ACR  03/16/2025   HEMOGLOBIN A1C  03/16/2025   OPHTHALMOLOGY EXAM  04/06/2025   Mammogram  08/26/2025   Diabetic kidney evaluation - eGFR measurement  09/16/2025   FOOT EXAM  09/23/2025   Cervical Cancer Screening (HPV/Pap Cotest)  06/19/2026   Colonoscopy  06/14/2027   Hepatitis C Screening  Completed   HIV Screening  Completed   Hepatitis B Vaccines 19-59 Average Risk  Aged Out   HPV VACCINES  Aged Out   Meningococcal B Vaccine  Aged Out   DTaP/Tdap/Td  Discontinued        Assessment/Plan:  This is a routine wellness examination for  Shonna.  Patient Care Team: Antonetta Rollene BRAVO, MD as PCP - General (Family Medicine) Margrette Taft BRAVO, MD as Consulting Physician (Orthopedic Surgery) Kristine Ronal BIRCH, RD as Dietitian (Nutrition) Cindie Carlin POUR, DO as Consulting Physician (Internal Medicine) Darroll Anes, DO [facility])  I have personally reviewed and noted the following in the patients chart:   Medical and social history Use of alcohol, tobacco or illicit drugs  Current medications and supplements including opioid prescriptions. Functional ability and status Nutritional status Physical activity Advanced directives List of other physicians Hospitalizations, surgeries, and ER visits in previous 12 months Vitals Screenings to include cognitive, depression, and falls Referrals and appointments  Orders Placed This Encounter  Procedures   DG Bone Density    Standing Status:   Future    Expiration Date:   10/31/2025    Reason for Exam (SYMPTOM  OR DIAGNOSIS REQUIRED):   post menopausal estrogen deficient    Preferred imaging location?:   [facility]    Is patient pregnant?:   No   In addition, I have reviewed and discussed with patient certain preventive protocols, quality metrics, and best practice recommendations. A written personalized care plan for preventive services as well as general preventive health recommendations were provided to patient.   Keturah Yerby, CMA   10/31/2024   Return on Wednesday November 01, 2025 at 8:00 am in office, for [facility] with Wellness Nurse.  After Visit Summary: (Declined) Due to this being a telephonic visit, with patients personalized plan was offered to patient but patient Declined AVS at this time    "

## 2024-10-31 NOTE — Patient Instructions (Signed)
 Ms. Radich,  Thank you for taking the time for your Medicare Wellness Visit. I appreciate your continued commitment to your health goals. Please review the care plan we discussed, and feel free to reach out if I can assist you further.  Please note that Annual Wellness Visits do not include a physical exam. Some assessments may be limited, especially if the visit was conducted virtually. If needed, we may recommend an in-person follow-up with your provider.  Ongoing Care Seeing your primary care provider every 3 to 6 months helps us  monitor your health and provide consistent, personalized care.   1 year follow up for Medicare well visit: Wednesday November 01, 2025 at 8:40am with medicare wellness nurse in office  Referrals If a referral was made during today's visit and you haven't received any updates within two weeks, please contact the referred provider directly to check on the status.  Osteoporosis Screening An order was placed for you to have your Osteoporosis Screening. Call the number below to schedule that [facility]  [phone]   Recommended Screenings:  Health Maintenance  Topic Date Due   Osteoporosis screening with Bone Density Scan  Never done   Medicare Annual Wellness Visit  10/25/2024   Flu Shot  01/31/2025*   Pneumococcal Vaccine for age over 2 (1 of 2 - PCV) 09/23/2025*   COVID-19 Vaccine (1 - 2025-26 season) 11/02/2025*   Zoster (Shingles) Vaccine (1 of 2) 11/01/2026*   Stool Blood Test  07/01/2027*   Yearly kidney health urinalysis for diabetes  03/16/2025   Hemoglobin A1C  03/16/2025   Eye exam for diabetics  04/06/2025   Breast Cancer Screening  08/26/2025   Yearly kidney function blood test for diabetes  09/16/2025   Complete foot exam   09/23/2025   Pap with HPV screening  06/19/2026   Colon Cancer Screening  06/14/2027   Hepatitis C Screening  Completed   HIV Screening  Completed   Hepatitis B Vaccine  Aged Out   HPV Vaccine  Aged Out    Meningitis B Vaccine  Aged Out   DTaP/Tdap/Td vaccine  Discontinued  *Topic was postponed. The date shown is not the original due date.       10/31/2024    8:58 AM  Advanced Directives  Does Patient Have a Medical Advance Directive? No  Would patient like information on creating a medical advance directive? Yes (MAU/Ambulatory/Procedural Areas - Information given)    Vision: Annual vision screenings are recommended for early detection of glaucoma, cataracts, and diabetic retinopathy. These exams can also reveal signs of chronic conditions such as diabetes and high blood pressure.  Dental: Annual dental screenings help detect early signs of oral cancer, gum disease, and other conditions linked to overall health, including heart disease and diabetes.  Please see the attached documents for additional preventive care recommendations.

## 2024-11-10 ENCOUNTER — Ambulatory Visit: Admitting: General Surgery

## 2024-11-10 ENCOUNTER — Encounter: Payer: Self-pay | Admitting: General Surgery

## 2024-11-10 VITALS — BP 112/79 | HR 79 | Temp 98.2°F | Resp 12 | Ht 60.0 in | Wt 155.0 lb

## 2024-11-10 DIAGNOSIS — L729 Follicular cyst of the skin and subcutaneous tissue, unspecified: Secondary | ICD-10-CM | POA: Diagnosis not present

## 2024-11-11 NOTE — Progress Notes (Signed)
 Kelli Spencer; [id number]; [birth year]   HPI Patient is a 59 year old black female who is referred to my care by Dr. Antonetta for evaluation and treatment of a sebaceous cyst on the left upper buttocks.  Is been present for many years.  No drainage has been noted.  No increase in size has been noted. Past Medical History:  Diagnosis Date   Arthritis    Asthma    Diabetes mellitus without complication (HCC)    Diastolic hypertension    Esophageal dysphagia 03/31/2018   GERD (gastroesophageal reflux disease)    Hyperlipidemia    Hypertension    Migraines    Obesity    Seizures (HCC)    pt states she has seizures only when she gets upset. pt is taking no seizure meds    Past Surgical History:  Procedure Laterality Date   ABDOMINAL HYSTERECTOMY  1997   fibroids   BALLOON DILATION N/A 01/25/2021   Procedure: BALLOON DILATION;  Surgeon: Cindie Carlin POUR, DO;  Location: [facility];  Service: Endoscopy;  Laterality: N/A;   BIOPSY  01/25/2021   Procedure: BIOPSY;  Surgeon: Cindie Carlin POUR, DO;  Location: [facility];  Service: Endoscopy;;   COLONOSCOPY N/A 06/23/2016   Dr. Harvey: prep not adequate to detect polyps less than 6mm, redundant left colon. hemorrhoids. next TCS in 5 years.    COLONOSCOPY WITH PROPOFOL  N/A 06/13/2022   Procedure: COLONOSCOPY WITH PROPOFOL ;  Surgeon: Cindie Carlin POUR, DO;  Location: [facility];  Service: Endoscopy;  Laterality: N/A;  11:00am, asa 3   ESOPHAGOGASTRODUODENOSCOPY N/A 04/02/2018   Dr. harvey: Benign-appearing esophageal stricture status post dilation, multiple benign gastric polyps, mild gastritis no H. pylori.   ESOPHAGOGASTRODUODENOSCOPY (EGD) WITH PROPOFOL  N/A 01/25/2021   Benign-appearing esophageal stenosis status post dilation, gastritis with no H. pylori.  Procedure: ESOPHAGOGASTRODUODENOSCOPY (EGD) WITH PROPOFOL ;  Surgeon: Cindie Carlin POUR, DO;  Location: [facility];  Service: Endoscopy;  Laterality: N/A;  am appt   KNEE  ARTHROSCOPY WITH MEDIAL MENISECTOMY Left 02/09/2014   Procedure: KNEE ARTHROSCOPY WITH MEDIAL MENISECTOMY;  Surgeon: Taft FORBES Minerva, MD;  Location: [facility];  Service: Orthopedics;  Laterality: Left;   PARTIAL HYSTERECTOMY     POLYPECTOMY  06/13/2022   Procedure: POLYPECTOMY;  Surgeon: Cindie Carlin POUR, DO;  Location: [facility];  Service: Endoscopy;;   SAVORY DILATION N/A 04/02/2018   Procedure: SAVORY DILATION;  Surgeon: Harvey Margo CROME, MD;  Location: [facility];  Service: Endoscopy;  Laterality: N/A;   TOTAL KNEE ARTHROPLASTY Right 03/18/2017   Procedure: TOTAL KNEE ARTHROPLASTY;  Surgeon: Minerva Taft FORBES, MD;  Location: [facility];  Service: Orthopedics;  Laterality: Right;    Family History  Problem Relation Age of Onset   Kidney failure Mother    Diabetes Mother    Hypertension Mother    Stroke Mother    Diabetes Father    Heart disease Father    Hypertension Sister    Colon cancer Neg Hx     Medications Ordered Prior to Encounter[1]  Allergies[2]  Social History   Substance and Sexual Activity  Alcohol Use No    Tobacco Use History[3]  Review of Systems  Constitutional: Negative.   HENT: Negative.    Eyes: Negative.   Respiratory: Negative.    Cardiovascular: Negative.   Gastrointestinal:  Positive for heartburn.  Genitourinary: Negative.   Musculoskeletal:  Positive for joint pain.  Skin: Negative.   Neurological: Negative.   Endo/Heme/Allergies: Negative.   Psychiatric/Behavioral: Negative.  Objective   Vitals:   11/10/24 1025  BP: 112/79  Pulse: 79  Resp: 12  Temp: 98.2 F (36.8 C)  SpO2: 95%    Physical Exam Vitals reviewed.  Constitutional:      Appearance: Normal appearance. She is not ill-appearing.  HENT:     Head: Normocephalic and atraumatic.  Cardiovascular:     Rate and Rhythm: Normal rate and regular rhythm.     Heart sounds: Normal heart sounds. No murmur heard.    No friction rub. No gallop.  Pulmonary:      Effort: Pulmonary effort is normal. No respiratory distress.     Breath sounds: Normal breath sounds. No stridor. No wheezing, rhonchi or rales.  Skin:    General: Skin is warm and dry.     Comments: 1.5 cm hard ovoid subcutaneous sebaceous cyst with punctum present.  No drainage noted.  No erythema noted.  Neurological:     Mental Status: She is alert and oriented to person, place, and time.     Assessment  Sebaceous cyst, left buttock Plan  I told the patient that this could be excised as a minor procedure in the operating room.  She states that it does not bother her at this time and she does not want surgical excision.  I told her to return to my care should she change her mind.  Follow-up here as needed.    [1]  Current Outpatient Medications on File Prior to Visit  Medication Sig Dispense Refill   ACCU-CHEK GUIDE TEST test strip USE TO CHECK BLOOD SUGAR TWICE DAILY 100 strip 0   Accu-Chek Softclix Lancets lancets USE TO TEST blood sugar twice daily  Dx E11.65 100 each 5   blood glucose meter kit and supplies Dispense based on patient and insurance preference. Once daily testing dx e11.9 1 each 0   Blood Glucose Monitoring Suppl DEVI 1 each by Does not apply route in the morning, at noon, and at bedtime. May substitute to any manufacturer covered by patient's insurance. 1 each 0   cholecalciferol  (VITAMIN D ) 400 UNITS TABS tablet Take 800 Units by mouth daily.     Glucose Blood (BLOOD GLUCOSE TEST STRIPS) STRP 1 each by In Vitro route in the morning, at noon, and at bedtime. May substitute to any manufacturer covered by patient's insurance. 100 strip 0   hydrochlorothiazide  (MICROZIDE ) 12.5 MG capsule Take 1 capsule (12.5 mg total) by mouth daily. 100 capsule 2   Lancet Device MISC 1 each by Does not apply route in the morning, at noon, and at bedtime. May substitute to any manufacturer covered by patient's insurance. 1 each 0   lubiprostone  (AMITIZA ) 24 MCG capsule TAKE 1 CAPSULE BY  MOUTH TWICE  DAILY WITH MEALS 200 capsule 2   metFORMIN  (GLUCOPHAGE -XR) 500 MG 24 hr tablet TAKE 2 TABLETS BY MOUTH ONCE  DAILY WITH BREAKFAST 200 tablet 2   pravastatin  (PRAVACHOL ) 10 MG tablet TAKE 1 TABLET BY MOUTH DAILY 100 tablet 2   RABEprazole  (ACIPHEX ) 20 MG tablet TAKE 1 TABLET BY MOUTH TWICE  DAILY BEFORE MEALS 200 tablet 2   Semaglutide , 2 MG/DOSE, 8 MG/3ML SOPN Inject 2 mg as directed once a week. 3 mL 5   No current facility-administered medications on file prior to visit.  [2]  Allergies Allergen Reactions   Prednisone  Swelling and Other (See Comments)    Made mouth and throat feel numb   Amlodipine  Itching and Swelling   Ace Inhibitors Cough  [3]  Social History Tobacco Use  Smoking Status Never  Smokeless Tobacco Never

## 2024-11-12 ENCOUNTER — Other Ambulatory Visit: Payer: Self-pay | Admitting: Gastroenterology

## 2024-11-15 NOTE — Telephone Encounter (Signed)
 Needs ov

## 2024-11-16 ENCOUNTER — Telehealth: Payer: Self-pay

## 2024-11-16 ENCOUNTER — Other Ambulatory Visit: Payer: Self-pay | Admitting: Family Medicine

## 2024-11-16 MED ORDER — SEMAGLUTIDE (2 MG/DOSE) 8 MG/3ML ~~LOC~~ SOPN: 2.0000 mg | PEN_INJECTOR | SUBCUTANEOUS | 5 refills | Status: DC

## 2024-11-16 NOTE — Addendum Note (Signed)
 Addended by: ANTONETTA ROLLENE BRAVO on: 11/16/2024 04:31 PM   Modules accepted: Orders

## 2024-11-16 NOTE — Telephone Encounter (Signed)
 "  Ozempic  refilled.   "

## 2024-11-16 NOTE — Telephone Encounter (Signed)
 Pt needing refill of Ozempic 

## 2024-11-17 ENCOUNTER — Telehealth: Payer: Self-pay

## 2024-11-17 ENCOUNTER — Other Ambulatory Visit: Payer: Self-pay

## 2024-11-17 ENCOUNTER — Other Ambulatory Visit (HOSPITAL_COMMUNITY): Payer: Self-pay

## 2024-11-17 ENCOUNTER — Telehealth: Payer: Self-pay | Admitting: Pharmacy Technician

## 2024-11-17 DIAGNOSIS — E785 Hyperlipidemia, unspecified: Secondary | ICD-10-CM

## 2024-11-17 MED ORDER — LUBIPROSTONE 24 MCG PO CAPS: 24.0000 ug | ORAL_CAPSULE | Freq: Two times a day (BID) | ORAL | 2 refills | Status: AC

## 2024-11-17 MED ORDER — PRAVASTATIN SODIUM 10 MG PO TABS: 10.0000 mg | ORAL_TABLET | Freq: Every day | ORAL | 2 refills | Status: AC

## 2024-11-17 MED ORDER — METFORMIN HCL ER 500 MG PO TB24: ORAL_TABLET | ORAL | 2 refills | Status: AC

## 2024-11-17 MED ORDER — HYDROCHLOROTHIAZIDE 12.5 MG PO CAPS: 12.5000 mg | ORAL_CAPSULE | Freq: Every day | ORAL | 2 refills | Status: AC

## 2024-11-17 NOTE — Telephone Encounter (Signed)
 Pharmacy Patient Advocate Encounter   Received notification from Pt Calls Messages that prior authorization for Ozempic  (2 MG/DOSE) 8MG /3ML pen-injectors is required/requested.   Insurance verification completed.   The patient is insured through [facility].   Per test claim: The current 28 day co-pay is, $4.90.  No PA needed at this time. This test claim was processed through [facility]- copay amounts may vary at other pharmacies due to pharmacy/plan contracts, or as the patient moves through the different stages of their insurance plan.

## 2024-11-17 NOTE — Telephone Encounter (Signed)
 Copied from CRM #[id number]. Topic: Clinical - Prescription Issue >> Nov 17, 2024  2:17 PM Gustabo D wrote: : Semaglutide , 2 MG/DOSE, 8 MG/3ML SOPN Pt says she has changed her insurance and is need ing the pcp to call [employer] and tell them why she is on the medication

## 2024-11-17 NOTE — Telephone Encounter (Signed)
 Maintenance meds sent to [facility]

## 2024-11-17 NOTE — Telephone Encounter (Signed)
 Noted

## 2024-11-17 NOTE — Telephone Encounter (Signed)
 Copied from CRM [id number]. Topic: Clinical - Medication Question >> Nov 17, 2024  2:19 PM Gustabo D wrote: Pt needs all her medications sent to [facility]

## 2024-11-17 NOTE — Telephone Encounter (Signed)
 PA request has been Received. New Encounter has been or will be created for follow up. For additional info see Pharmacy Prior Auth telephone encounter from 11/17/2024.

## 2024-11-18 ENCOUNTER — Other Ambulatory Visit (HOSPITAL_COMMUNITY): Payer: Self-pay

## 2024-11-18 ENCOUNTER — Other Ambulatory Visit: Payer: Self-pay

## 2024-11-18 ENCOUNTER — Telehealth: Payer: Self-pay

## 2024-11-18 MED ORDER — SEMAGLUTIDE (2 MG/DOSE) 8 MG/3ML ~~LOC~~ SOPN: 2.0000 mg | PEN_INJECTOR | SUBCUTANEOUS | 5 refills | Status: AC

## 2024-11-18 NOTE — Telephone Encounter (Signed)
 Patient asking for samples of  ozempic  until she gets her refill request since PA is done

## 2024-11-18 NOTE — Telephone Encounter (Signed)
 Per the pharmacist all [facility]. Insurance is requiring a new prescription with the ICD-10 diagnosis code to be sent. Can a new prescription be sent?

## 2024-11-18 NOTE — Telephone Encounter (Signed)
 Done

## 2024-11-18 NOTE — Telephone Encounter (Signed)
 Copied from CRM [id number]. Topic: Clinical - Prescription Issue >> Nov 18, 2024 11:36 AM Avram MATSU wrote: Reason for CRM: patient would like a callback regarding her PA. I relayed the notes to the patient.   Ozempic  (2 MG/DOSE) 8MG /3ML pen-injectors

## 2024-11-18 NOTE — Telephone Encounter (Signed)
 Sent!

## 2024-11-21 ENCOUNTER — Telehealth: Payer: Self-pay | Admitting: Family Medicine

## 2024-11-21 ENCOUNTER — Other Ambulatory Visit (HOSPITAL_COMMUNITY): Payer: Self-pay

## 2024-11-21 NOTE — Telephone Encounter (Signed)
 Medications were sent to pharmacy

## 2024-11-21 NOTE — Telephone Encounter (Unsigned)
 Copied from CRM [id number]. Topic: Clinical - Medication Prior Auth >> Nov 21, 2024  1:14 PM Victoria B wrote: Reason for CRM: Shanitra from [facility] , has PA pending for Ozempic  and has more questions to ask about it

## 2024-11-21 NOTE — Telephone Encounter (Signed)
 Patient said the insurance she has [employer] at the pharmacy needs more documentation on why she is on this ozempic , blood pressure pills and metformin  or  pravastatin .  Still pending since last Friday, waiting on her dr why on the medication. Saying all her meds except one she can not get none of her medications due to waiting on [employer] and doctor on why.

## 2024-11-21 NOTE — Telephone Encounter (Signed)
 Patient notified

## 2024-11-22 ENCOUNTER — Other Ambulatory Visit (HOSPITAL_COMMUNITY): Payer: Self-pay

## 2024-11-23 ENCOUNTER — Other Ambulatory Visit: Payer: Self-pay | Admitting: Family Medicine

## 2024-11-30 ENCOUNTER — Other Ambulatory Visit: Payer: Self-pay | Admitting: Internal Medicine

## 2024-11-30 DIAGNOSIS — E1159 Type 2 diabetes mellitus with other circulatory complications: Secondary | ICD-10-CM

## 2024-12-01 ENCOUNTER — Ambulatory Visit: Admitting: General Surgery

## 2024-12-01 ENCOUNTER — Encounter: Payer: Self-pay | Admitting: General Surgery

## 2024-12-01 VITALS — BP 120/80 | HR 89 | Temp 98.6°F | Resp 16 | Ht 60.0 in | Wt 153.0 lb

## 2024-12-01 DIAGNOSIS — L729 Follicular cyst of the skin and subcutaneous tissue, unspecified: Secondary | ICD-10-CM | POA: Diagnosis not present

## 2024-12-01 NOTE — Progress Notes (Signed)
 Subjective:     Kelli Spencer  Patient returns back with increasing pain and swelling over her sebaceous cyst on the left upper buttocks. Objective:    BP 120/80   Pulse 89   Temp 98.6 F (37 C) (Oral)   Resp 16   Ht 5' (1.524 m)   Wt 153 lb (69.4 kg)   LMP 06/10/1988   SpO2 95%   BMI 29.88 kg/m   General:  alert and cooperative  A large greater than 2 cm sebaceous cyst with punctum is present in the left upper buttock/lower back. Procedure note informed consent was obtained.  The area was prepped with Betadine.  Aerofreeze was used for numbing.  An incision was made to include the punctum and a significant amount of sebum was expressed.  I was able to get most of the sebaceous cyst wall.  The wound was cleaned with peroxide.  A dry sterile dressing was applied.  Triple antibiotic was also applied.  Patient tolerated the procedure well.     Assessment:    Sebaceous cyst, left upper buttock    Plan:   Patient was told to keep the wound clean and dry with soap and water .  She may apply antibiotic cream as appropriate.  Follow-up here in 2 weeks for wound check.

## 2024-12-07 ENCOUNTER — Other Ambulatory Visit (HOSPITAL_COMMUNITY): Payer: Self-pay

## 2024-12-15 ENCOUNTER — Ambulatory Visit: Admitting: General Surgery

## 2024-12-26 ENCOUNTER — Ambulatory Visit: Admitting: Gastroenterology

## 2025-03-24 ENCOUNTER — Ambulatory Visit: Admitting: Family Medicine

## 2025-11-01 ENCOUNTER — Ambulatory Visit: Payer: Self-pay

## 2027-03-29 ENCOUNTER — Ambulatory Visit: Admitting: Orthopedic Surgery
